# Patient Record
Sex: Female | Born: 1945 | Race: Black or African American | Hispanic: No | Marital: Single | Smoking: Former smoker
Health system: Southern US, Community
[De-identification: ages and names within clinical notes are randomized; demographics above are authoritative.]

## PROBLEM LIST (undated history)

## (undated) DIAGNOSIS — R351 Nocturia: Secondary | ICD-10-CM

## (undated) DIAGNOSIS — M069 Rheumatoid arthritis, unspecified: Secondary | ICD-10-CM

## (undated) DIAGNOSIS — R609 Edema, unspecified: Secondary | ICD-10-CM

## (undated) DIAGNOSIS — E039 Hypothyroidism, unspecified: Secondary | ICD-10-CM

## (undated) DIAGNOSIS — Z860100 Personal history of colon polyps, unspecified: Secondary | ICD-10-CM

## (undated) DIAGNOSIS — R200 Anesthesia of skin: Secondary | ICD-10-CM

## (undated) DIAGNOSIS — R0602 Shortness of breath: Secondary | ICD-10-CM

## (undated) DIAGNOSIS — M1990 Unspecified osteoarthritis, unspecified site: Secondary | ICD-10-CM

## (undated) DIAGNOSIS — J42 Unspecified chronic bronchitis: Secondary | ICD-10-CM

## (undated) DIAGNOSIS — R42 Dizziness and giddiness: Secondary | ICD-10-CM

## (undated) DIAGNOSIS — H04123 Dry eye syndrome of bilateral lacrimal glands: Secondary | ICD-10-CM

## (undated) DIAGNOSIS — I10 Essential (primary) hypertension: Secondary | ICD-10-CM

## (undated) DIAGNOSIS — E785 Hyperlipidemia, unspecified: Secondary | ICD-10-CM

## (undated) DIAGNOSIS — Z8719 Personal history of other diseases of the digestive system: Secondary | ICD-10-CM

## (undated) DIAGNOSIS — R600 Localized edema: Secondary | ICD-10-CM

## (undated) DIAGNOSIS — D649 Anemia, unspecified: Secondary | ICD-10-CM

## (undated) DIAGNOSIS — Z433 Encounter for attention to colostomy: Secondary | ICD-10-CM

## (undated) DIAGNOSIS — Z9289 Personal history of other medical treatment: Secondary | ICD-10-CM

## (undated) DIAGNOSIS — I2510 Atherosclerotic heart disease of native coronary artery without angina pectoris: Secondary | ICD-10-CM

## (undated) DIAGNOSIS — R202 Paresthesia of skin: Secondary | ICD-10-CM

## (undated) DIAGNOSIS — K5790 Diverticulosis of intestine, part unspecified, without perforation or abscess without bleeding: Secondary | ICD-10-CM

## (undated) HISTORY — PX: COLONOSCOPY: SHX174

## (undated) HISTORY — PX: TONSILLECTOMY: SUR1361

## (undated) HISTORY — PX: COLOSTOMY: SHX63

## (undated) HISTORY — PX: ESOPHAGOGASTRODUODENOSCOPY: SHX1529

## (undated) HISTORY — PX: EYE SURGERY: SHX253

## (undated) HISTORY — PX: KNEE ARTHROSCOPY: SUR90

## (undated) HISTORY — PX: TOTAL KNEE ARTHROPLASTY: SHX125

---
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
## 1997-05-08 ENCOUNTER — Ambulatory Visit (HOSPITAL_COMMUNITY): Admission: RE | Admit: 1997-05-08 | Discharge: 1997-05-08 | Payer: Self-pay | Admitting: Orthopedic Surgery

## 1997-07-11 ENCOUNTER — Ambulatory Visit (HOSPITAL_COMMUNITY): Admission: RE | Admit: 1997-07-11 | Discharge: 1997-07-11 | Payer: Self-pay | Admitting: Orthopedic Surgery

## 1997-08-02 ENCOUNTER — Encounter: Admission: RE | Admit: 1997-08-02 | Discharge: 1997-10-31 | Payer: Self-pay | Admitting: Orthopedic Surgery

## 1997-12-08 ENCOUNTER — Ambulatory Visit (HOSPITAL_COMMUNITY): Admission: RE | Admit: 1997-12-08 | Discharge: 1997-12-08 | Payer: Self-pay | Admitting: *Deleted

## 1998-04-12 ENCOUNTER — Ambulatory Visit (HOSPITAL_COMMUNITY): Admission: RE | Admit: 1998-04-12 | Discharge: 1998-04-12 | Payer: Self-pay | Admitting: Orthopedic Surgery

## 1998-09-07 ENCOUNTER — Ambulatory Visit (HOSPITAL_COMMUNITY): Admission: RE | Admit: 1998-09-07 | Discharge: 1998-09-07 | Payer: Self-pay | Admitting: Orthopedic Surgery

## 1998-12-01 ENCOUNTER — Emergency Department (HOSPITAL_COMMUNITY): Admission: EM | Admit: 1998-12-01 | Discharge: 1998-12-01 | Payer: Self-pay | Admitting: Emergency Medicine

## 2000-01-28 HISTORY — PX: COLON SURGERY: SHX602

## 2000-05-07 ENCOUNTER — Ambulatory Visit (HOSPITAL_COMMUNITY): Admission: RE | Admit: 2000-05-07 | Discharge: 2000-05-07 | Payer: Self-pay | Admitting: Cardiology

## 2000-05-08 ENCOUNTER — Encounter: Payer: Self-pay | Admitting: Cardiology

## 2000-08-27 ENCOUNTER — Ambulatory Visit (HOSPITAL_COMMUNITY): Admission: RE | Admit: 2000-08-27 | Discharge: 2000-08-27 | Payer: Self-pay | Admitting: Orthopedic Surgery

## 2000-09-29 ENCOUNTER — Encounter: Payer: Self-pay | Admitting: Gastroenterology

## 2000-09-29 ENCOUNTER — Encounter: Admission: RE | Admit: 2000-09-29 | Discharge: 2000-09-29 | Payer: Self-pay | Admitting: Gastroenterology

## 2000-10-02 ENCOUNTER — Encounter: Payer: Self-pay | Admitting: Orthopedic Surgery

## 2000-10-06 ENCOUNTER — Ambulatory Visit (HOSPITAL_COMMUNITY): Admission: RE | Admit: 2000-10-06 | Discharge: 2000-10-06 | Payer: Self-pay | Admitting: Orthopedic Surgery

## 2000-10-23 ENCOUNTER — Ambulatory Visit (HOSPITAL_COMMUNITY): Admission: RE | Admit: 2000-10-23 | Discharge: 2000-10-23 | Payer: Self-pay | Admitting: Gastroenterology

## 2000-11-11 ENCOUNTER — Encounter: Admission: RE | Admit: 2000-11-11 | Discharge: 2000-11-26 | Payer: Self-pay | Admitting: Orthopedic Surgery

## 2000-11-13 ENCOUNTER — Ambulatory Visit (HOSPITAL_COMMUNITY): Admission: RE | Admit: 2000-11-13 | Discharge: 2000-11-13 | Payer: Self-pay | Admitting: Gastroenterology

## 2000-11-13 ENCOUNTER — Encounter: Payer: Self-pay | Admitting: Gastroenterology

## 2001-01-18 ENCOUNTER — Encounter: Payer: Self-pay | Admitting: Rheumatology

## 2001-01-18 ENCOUNTER — Encounter: Admission: RE | Admit: 2001-01-18 | Discharge: 2001-01-18 | Payer: Self-pay | Admitting: Rheumatology

## 2001-05-19 ENCOUNTER — Encounter: Admission: RE | Admit: 2001-05-19 | Discharge: 2001-05-19 | Payer: Self-pay | Admitting: Gastroenterology

## 2001-05-19 ENCOUNTER — Encounter: Payer: Self-pay | Admitting: Gastroenterology

## 2001-05-19 IMAGING — CT CT PELVIS W/ CM
1 series · 15 of 32 positions shown, 19 images · IV contrast (GASTROGRAFIN & OMNIPAQUE [ID])
Comparison: none

FINDINGS
CLINICAL DATA: LOWER ABDOMINAL AND PELVIC PAIN AND TENDERNESS.  PREVIOUS HX OF HYSTERECTOMY AS
WELL AS RECTAL TEAR DURING VAGINAL DELIVERY.  CON NONE.
TECHNIQUE
SPIRAL CT OF THE ABDOMEN AND PELVIS WAS PERFORMED DURING ADMINISTRATION OF [Q6] OF OMNIPAQUE 300
IV CONTRAST.  ORAL CONTRAST WAS ALSO ADMINISTERED.  DELAYED IMAGES WERE ALSO OBTAINED THROUGH THE
PELVIS AFTER ADMINISTRATION OF RECTAL CONTRAST MATERIAL.  THIS WAS TECHNICALLY DIFFICULT DUE TO THE
CHRONIC RECTAL WALL TEAR AND COMMUNICATION WITH THE VAGINA WHICH WAS EVIDENT DURING PLACEMENT OF
THE RECTAL CATHETER.  COMPARISON IS MADE TO PRIOR CT OF [DATE].
CT ABDOMEN W/CONTRAST
LAYERING SLUDGE IS AGAIN SEEN WITHIN THE GALLBLADDER BUT THERE IS NO EVIDENCE OF DEFINITE
GALLSTONES OR GALLBLADDER WALL THICKENING.  THE LIVER, SPLEEN, PANCREAS, ADRENAL GLANDS, AND
KIDNEYS ARE NORMAL IN APPEARANCE.
THERE IS NO EVIDENCE OF ABDOMINAL MASSES OR INFLAMMATORY PROCESS.  THERE IS NO EVIDENCE OF ABNORMAL
FLUID COLLECTIONS.
IMPRESSION
1.  LAYERING SLUDGE WITHIN THE GALLBLADDER.  NO EVIDENCE OF CHOLECYSTITIS OR OTHER ACUTE PROCESS.
CT PELVIS W/CONTRAST
TODAY'S STUDY DEMONSTRATES A RELATIVELY LONG SEGMENT OF SIGMOID COLONIC WALL THICKENING AS WELL AS
SIGMOID DIVERTICULA IN THIS REGION.  THIS IS MOST LIKELY DUE TO SIGMOID DIVERTICULITIS.  IN
ADDITION, THERE IS A POSSIBLE EXTRALUMINAL COLLECTION CONTAINING AIR AND A SMALL RADIODENSITY WHICH
WAS NOT SEEN ON THE PREVIOUS STUDY WHICH MAY REPRESENT AN INGESTED PILL OR OTHER RADIOPAQUE
SUBSTANCE.  THERE IS ADJACENT THICKENING OF THE DOME OF THE URINARY BLADDER AND A SMALL AMOUNT OF
GAS IS SEEN WITHIN THE URINARY BLADDER.  THESE FINDINGS ARE SUSPICIOUS FOR A COLOVESICAL FISTULA.
RECTAL CONTRAST COULD NOT BE SUCCESSFULLY REFLUXED INTO THE SIGMOID COLON FROM THE RECTAL TUBE DUE
TO THE RECTAL WALL TEAR AND COMMUNICATION WITH THE VAGINA RESULTING IN CONTRAST LEAKAGE.
1.  FINDINGS MOST CONSISTENT WITH SIGMOID DIVERTICULITIS.  THERE IS ALSO A SMALL AMOUNT OF AIR AND
WALL THICKENING IN THE ADJACENT URINARY BLADDER, SUSPICIOUS FOR COLOVESICAL FISTULA.

[Series 2: — · axial · 0.82mm/px · z∈[-370,+22]mm · 15 of 137 slices shown, 19 images]
[im 9/137  soft-tissue]
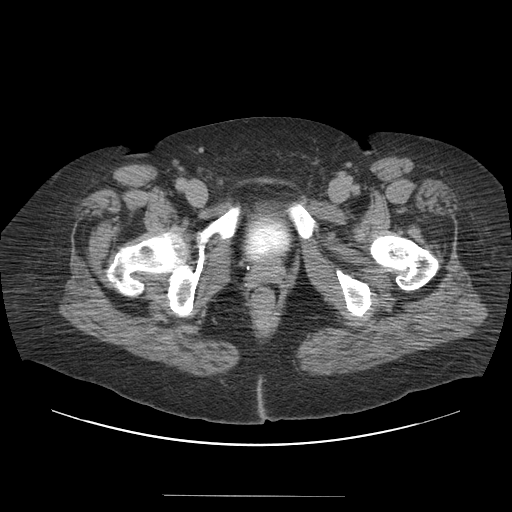
[im 9/137  bone]
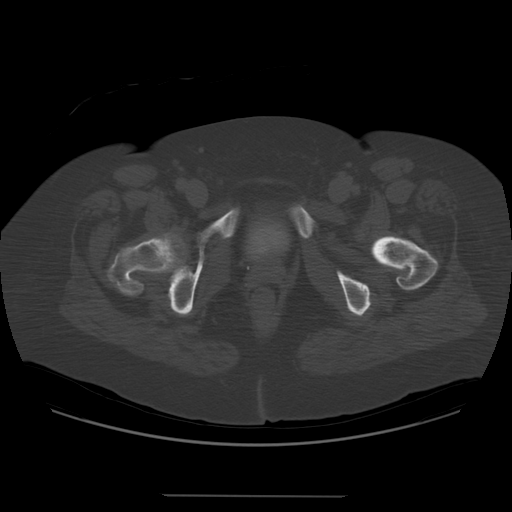
[im 18/137  soft-tissue]
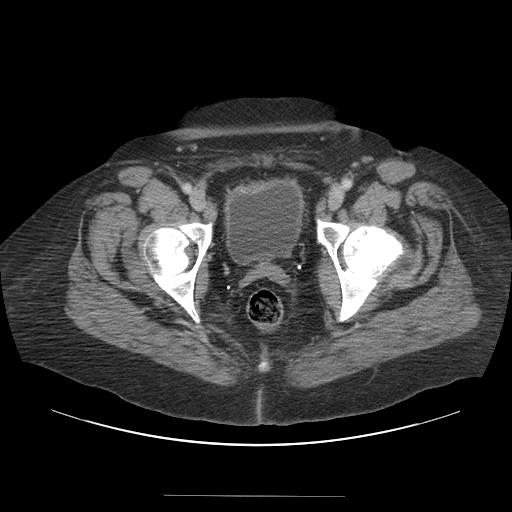
[im 27/137  soft-tissue]
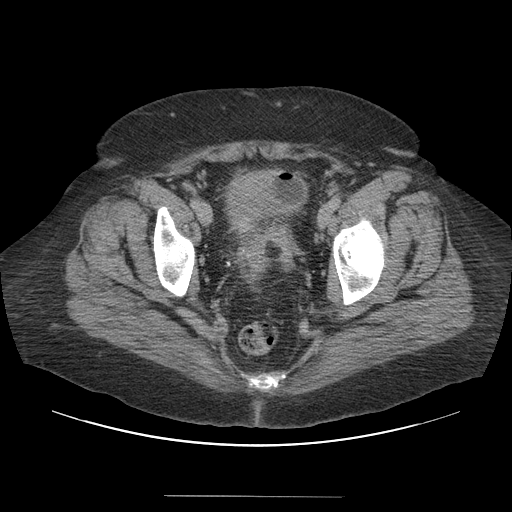
[im 40/137  soft-tissue]
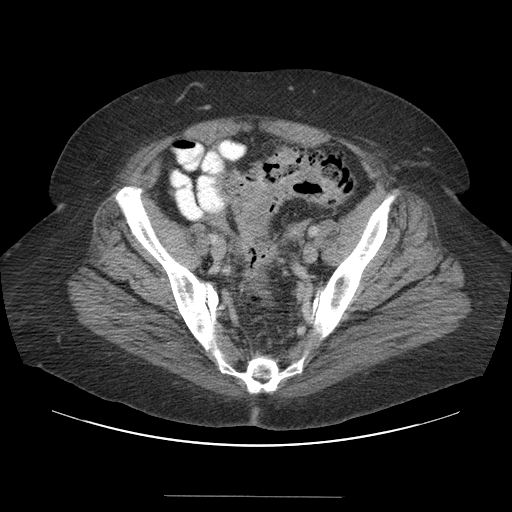
[im 49/137  soft-tissue]
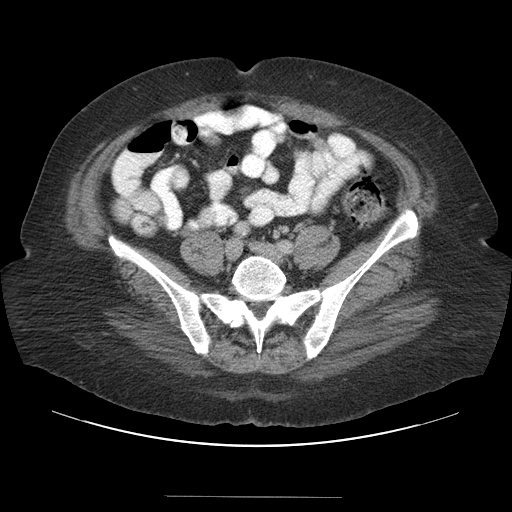
[im 58/137  soft-tissue]
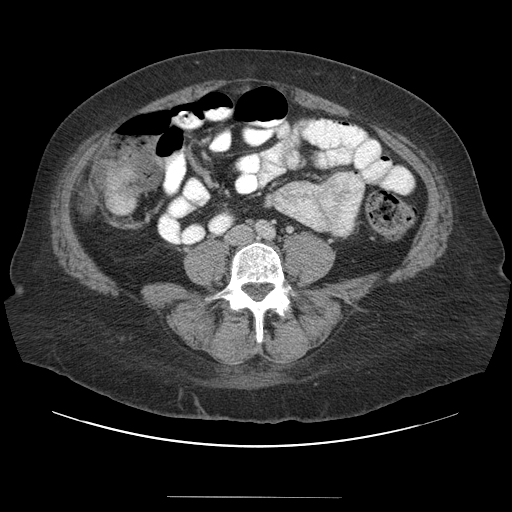
[im 71/137  soft-tissue]
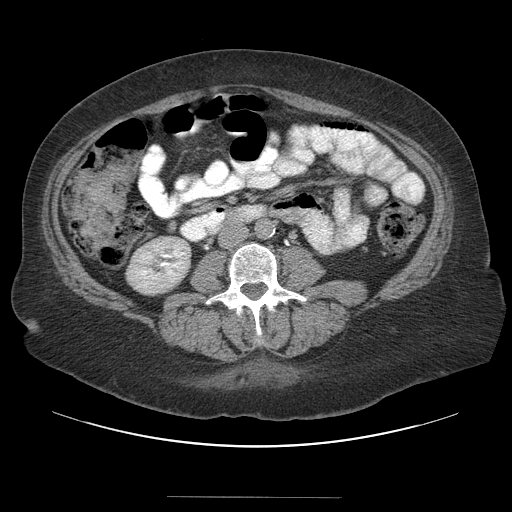
[im 79/137  soft-tissue]
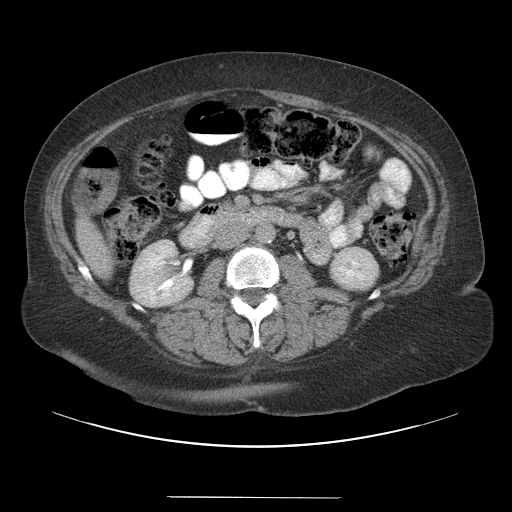
[im 88/137  soft-tissue]
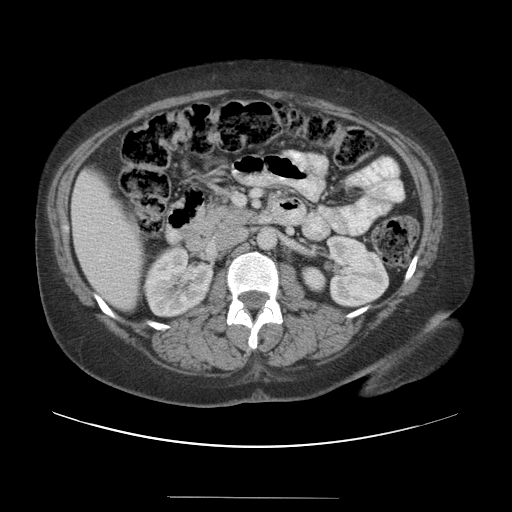
[im 88/137  bone]
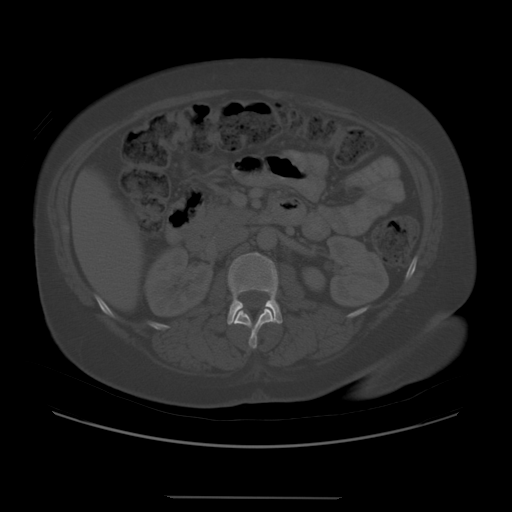
[im 97/137  soft-tissue]
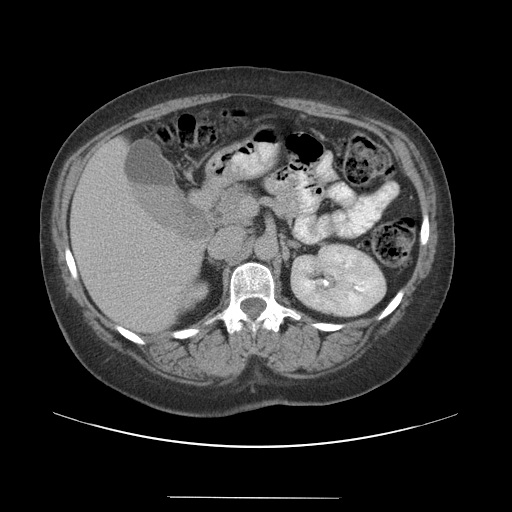
[im 110/137  soft-tissue]
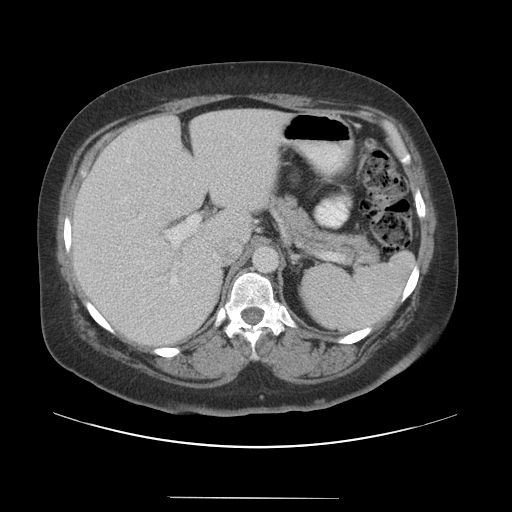
[im 119/137  soft-tissue]
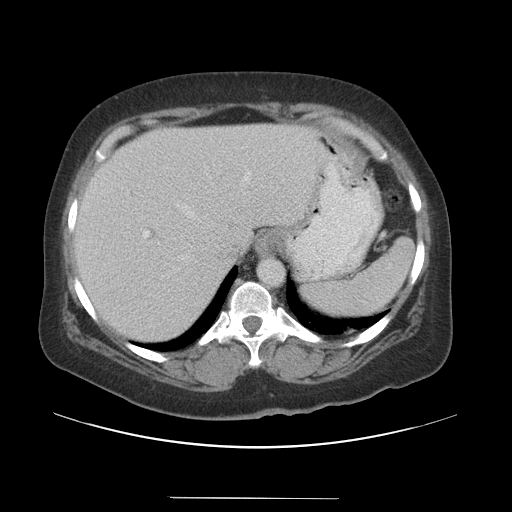
[im 119/137  lung]
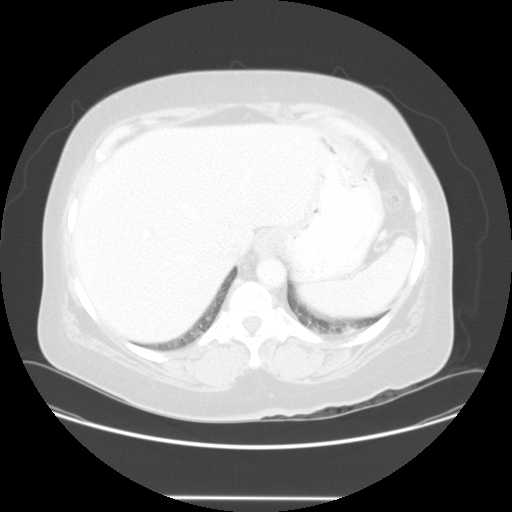
[im 123/137  lung]
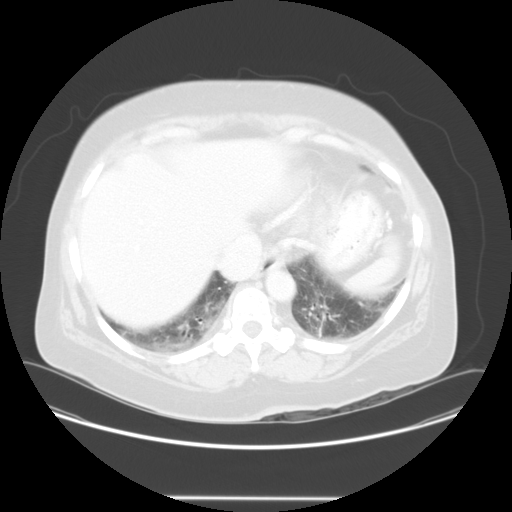
[im 128/137  soft-tissue]
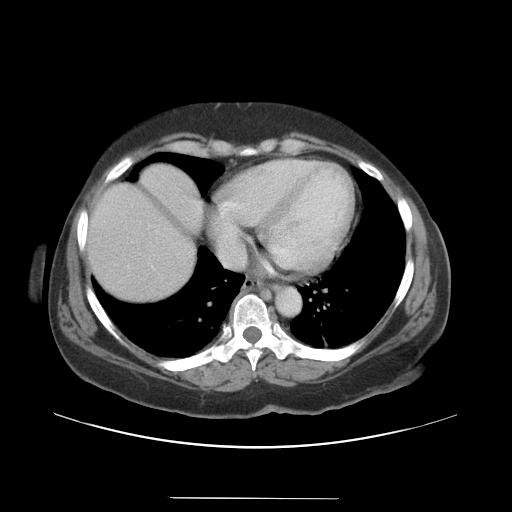
[im 128/137  lung]
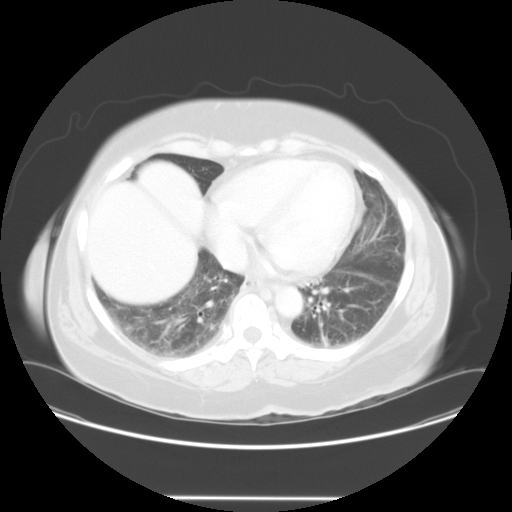
[im 132/137  lung]
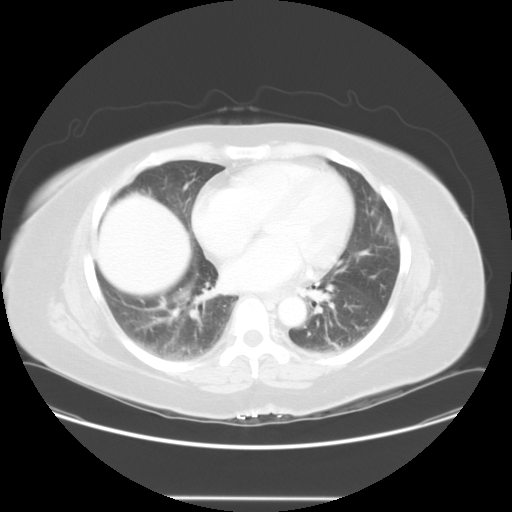

[15 of 32 positions shown; findings below may reference images not displayed]

## 2001-07-14 ENCOUNTER — Ambulatory Visit (HOSPITAL_COMMUNITY): Admission: RE | Admit: 2001-07-14 | Discharge: 2001-07-14 | Payer: Self-pay | Admitting: Cardiology

## 2001-07-14 ENCOUNTER — Encounter: Payer: Self-pay | Admitting: Cardiology

## 2001-08-02 ENCOUNTER — Encounter: Payer: Self-pay | Admitting: General Surgery

## 2001-08-09 ENCOUNTER — Encounter (INDEPENDENT_AMBULATORY_CARE_PROVIDER_SITE_OTHER): Payer: Self-pay | Admitting: Specialist

## 2001-08-09 ENCOUNTER — Inpatient Hospital Stay (HOSPITAL_COMMUNITY): Admission: RE | Admit: 2001-08-09 | Discharge: 2001-08-19 | Payer: Self-pay | Admitting: General Surgery

## 2001-08-16 ENCOUNTER — Encounter: Payer: Self-pay | Admitting: Urology

## 2002-01-27 DIAGNOSIS — Z433 Encounter for attention to colostomy: Secondary | ICD-10-CM

## 2002-01-27 HISTORY — DX: Encounter for attention to colostomy: Z43.3

## 2002-05-02 ENCOUNTER — Encounter: Payer: Self-pay | Admitting: General Surgery

## 2002-05-02 ENCOUNTER — Encounter: Admission: RE | Admit: 2002-05-02 | Discharge: 2002-05-02 | Payer: Self-pay | Admitting: General Surgery

## 2002-06-17 ENCOUNTER — Inpatient Hospital Stay (HOSPITAL_COMMUNITY): Admission: RE | Admit: 2002-06-17 | Discharge: 2002-06-28 | Payer: Self-pay | Admitting: General Surgery

## 2002-06-17 ENCOUNTER — Encounter (INDEPENDENT_AMBULATORY_CARE_PROVIDER_SITE_OTHER): Payer: Self-pay | Admitting: Specialist

## 2002-06-24 ENCOUNTER — Encounter: Payer: Self-pay | Admitting: General Surgery

## 2002-06-26 ENCOUNTER — Encounter: Payer: Self-pay | Admitting: General Surgery

## 2003-06-07 ENCOUNTER — Ambulatory Visit (HOSPITAL_COMMUNITY): Admission: RE | Admit: 2003-06-07 | Discharge: 2003-06-07 | Payer: Self-pay | Admitting: Cardiology

## 2003-06-07 IMAGING — NM NM MYOCAR MULTI W/ SPECT
1 series · 6 of 6 positions shown · non-contrast
Comparison: none

CLINICAL DATA: Chest pain.  Hypercholesterolemia.  Preop.  
 NUCLEAR MEDICINE MYOCARDIAL PERFUSION IMAGING WITH SPECT, [DATE], [II] HOURS

[sc stress gated cardio · 6.85mm/px · 6 of 512 frames shown]
[frame 43/512]
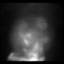
[frame 128/512]
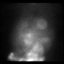
[frame 214/512]
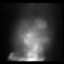
[frame 299/512]
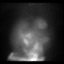
[frame 384/512]
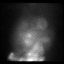
[frame 470/512]
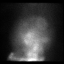

[6 of 6 positions shown; findings below may reference images not displayed]

FINDINGS: After the intravenous injection of 10 and 30 mCi of [II] sestamibi, myocardial perfusion rest and stress imaging was obtained.  Persantine was utilized as the stress agent.
 MYOCARDIAL PERFUSION IMAGING WITH SPECT
 In the anterolateral apex, a perfusion defect is seen on the stress images that reperfuses on the rest images compatible with an area of stress induced ischemia.  This is visualized on all three views. 
 WALL MOTION
 There is normal wall motion and wall thickening.
 EJECTION FRACTION
 Calculated ejection fraction is 67% with an end diastolic volume of 99 ml and an end systolic volume of 32 ml.  
 IMPRESSION
 EF 67%.
 Stress induced ischemia in the anterolateral apex.

## 2003-06-07 IMAGING — NM NM MYOCAR MULTI W/ SPECT
1 series · 6 of 6 positions shown · non-contrast
Comparison: none

CLINICAL DATA: Chest pain.  Hypercholesterolemia.  Preop.  
 NUCLEAR MEDICINE MYOCARDIAL PERFUSION IMAGING WITH SPECT, [DATE], [II] HOURS

[Series 1: rc rest cardiolite · 6.85mm/px · 6 of 64 frames shown]
[frame 6/64]
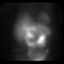
[frame 16/64]
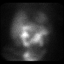
[frame 27/64]
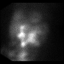
[frame 38/64]
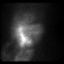
[frame 48/64]
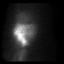
[frame 59/64]
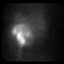

[6 of 6 positions shown; findings below may reference images not displayed]

FINDINGS: After the intravenous injection of 10 and 30 mCi of [II] sestamibi, myocardial perfusion rest and stress imaging was obtained.  Persantine was utilized as the stress agent.
 MYOCARDIAL PERFUSION IMAGING WITH SPECT
 In the anterolateral apex, a perfusion defect is seen on the stress images that reperfuses on the rest images compatible with an area of stress induced ischemia.  This is visualized on all three views. 
 WALL MOTION
 There is normal wall motion and wall thickening.
 EJECTION FRACTION
 Calculated ejection fraction is 67% with an end diastolic volume of 99 ml and an end systolic volume of 32 ml.  
 IMPRESSION
 EF 67%.
 Stress induced ischemia in the anterolateral apex.

## 2003-06-22 ENCOUNTER — Ambulatory Visit (HOSPITAL_COMMUNITY): Admission: RE | Admit: 2003-06-22 | Discharge: 2003-06-22 | Payer: Self-pay | Admitting: Cardiology

## 2003-06-22 IMAGING — CR DG CHEST 2V
2 series · 2 of 2 positions shown · non-contrast
Comparison: [DATE].

CLINICAL DATA: Chest pain. Pre-catheterization. 
 TWO VIEW CHEST

[view not recorded (1 of 2)]
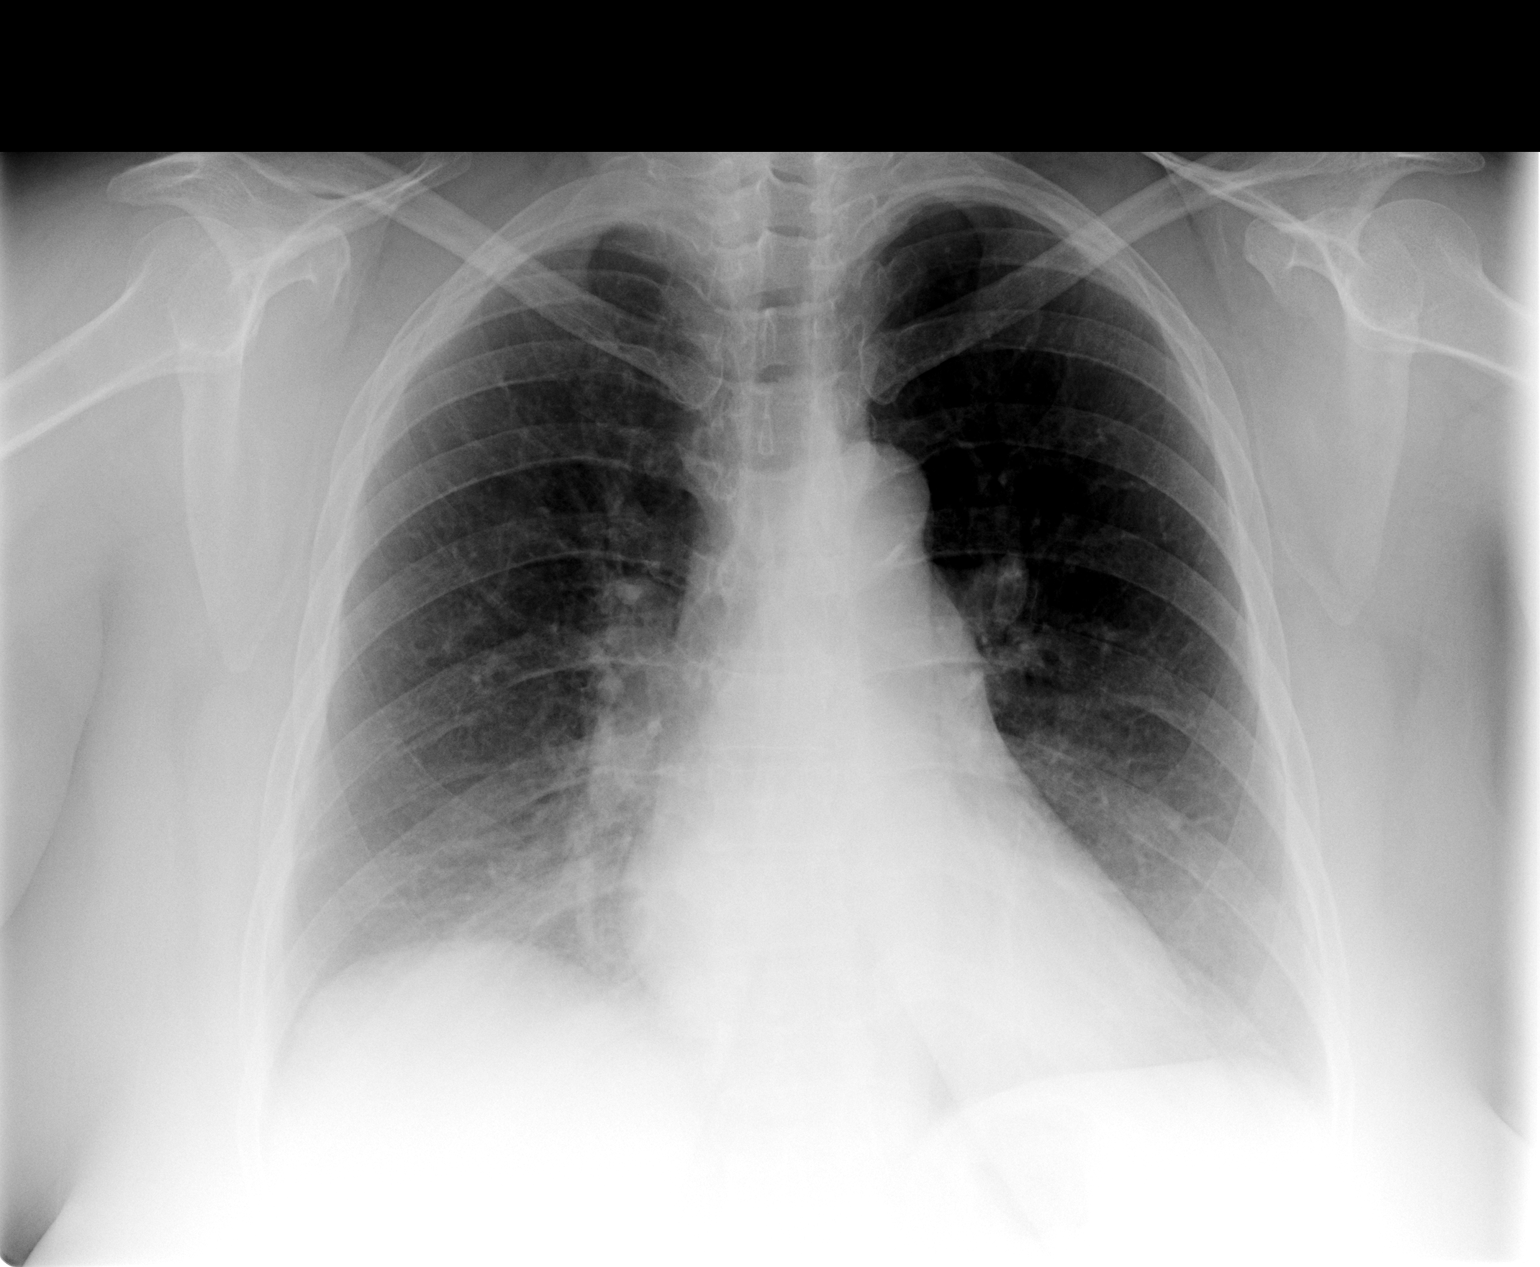

[view not recorded (2 of 2)]
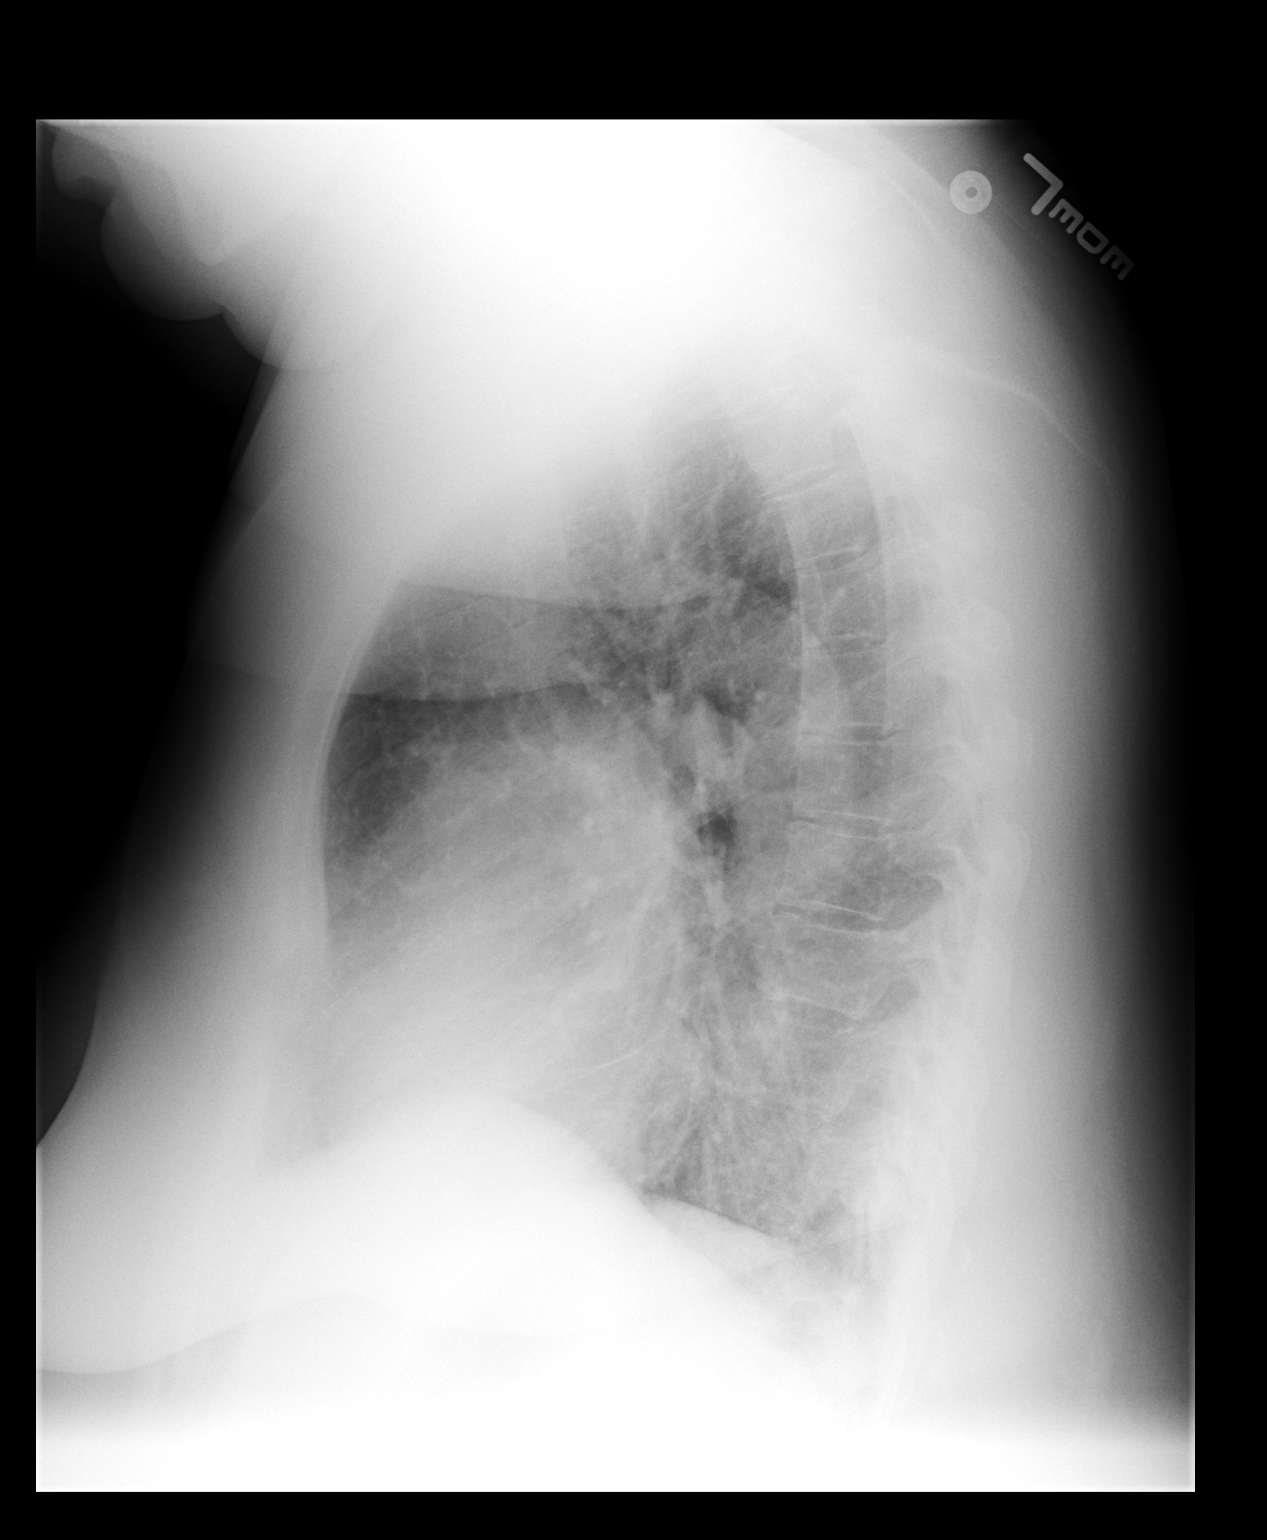

[2 of 2 positions shown; findings below may reference images not displayed]

The heart is borderline in size with a left ventricular configuration.  There are mildly accentuated bronchial markings.  There are no focal infiltrates.  
 IMPRESSION
 Borderline cardiac size with mildly accentuated bronchial markings.  No focal infiltrates.

## 2003-08-03 IMAGING — CR DG CHEST 2V
2 series · 2 of 2 positions shown · non-contrast
Comparison: [DATE].

CLINICAL DATA: 58-year-old female with degenerative arthritis of the left knee.  Preoperative respiratory evaluation for left knee replacement.  History of smoking and shortness of breath with exertion.
 CHEST, TWO VIEWS [DATE]

[view not recorded (1 of 2)]
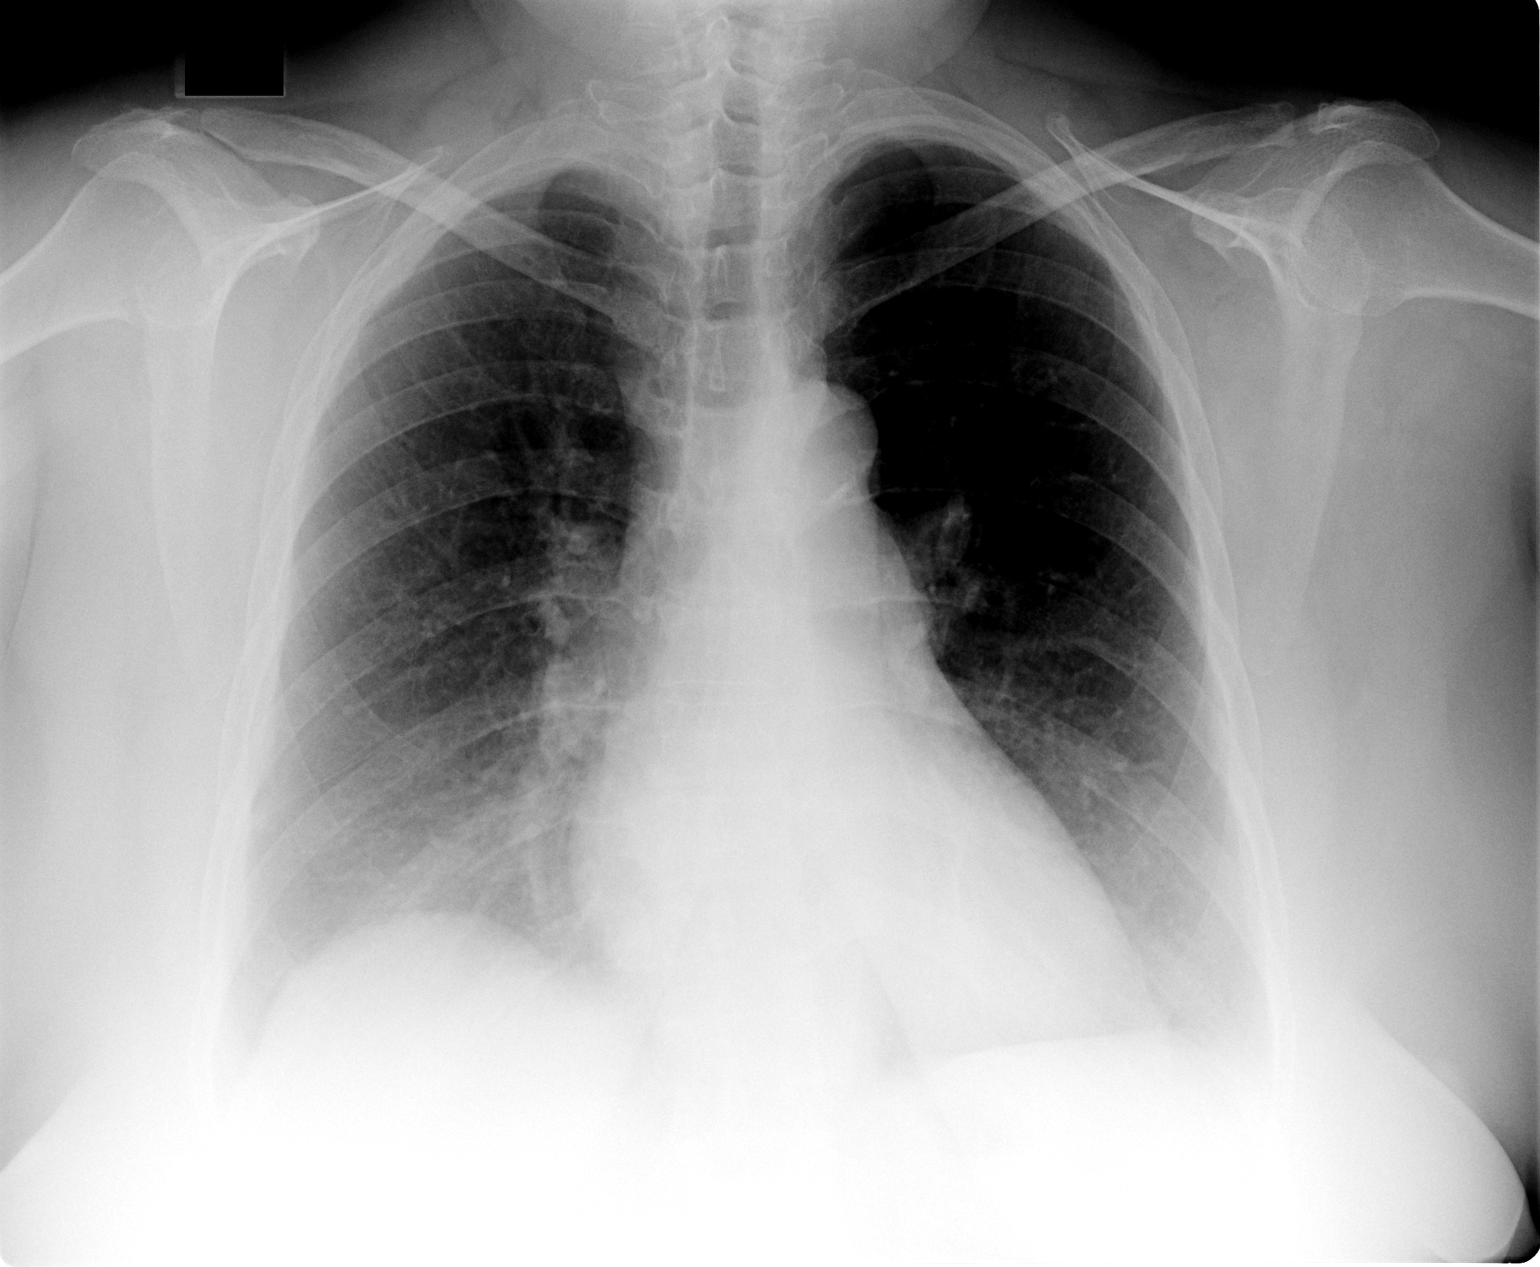

[view not recorded (2 of 2)]
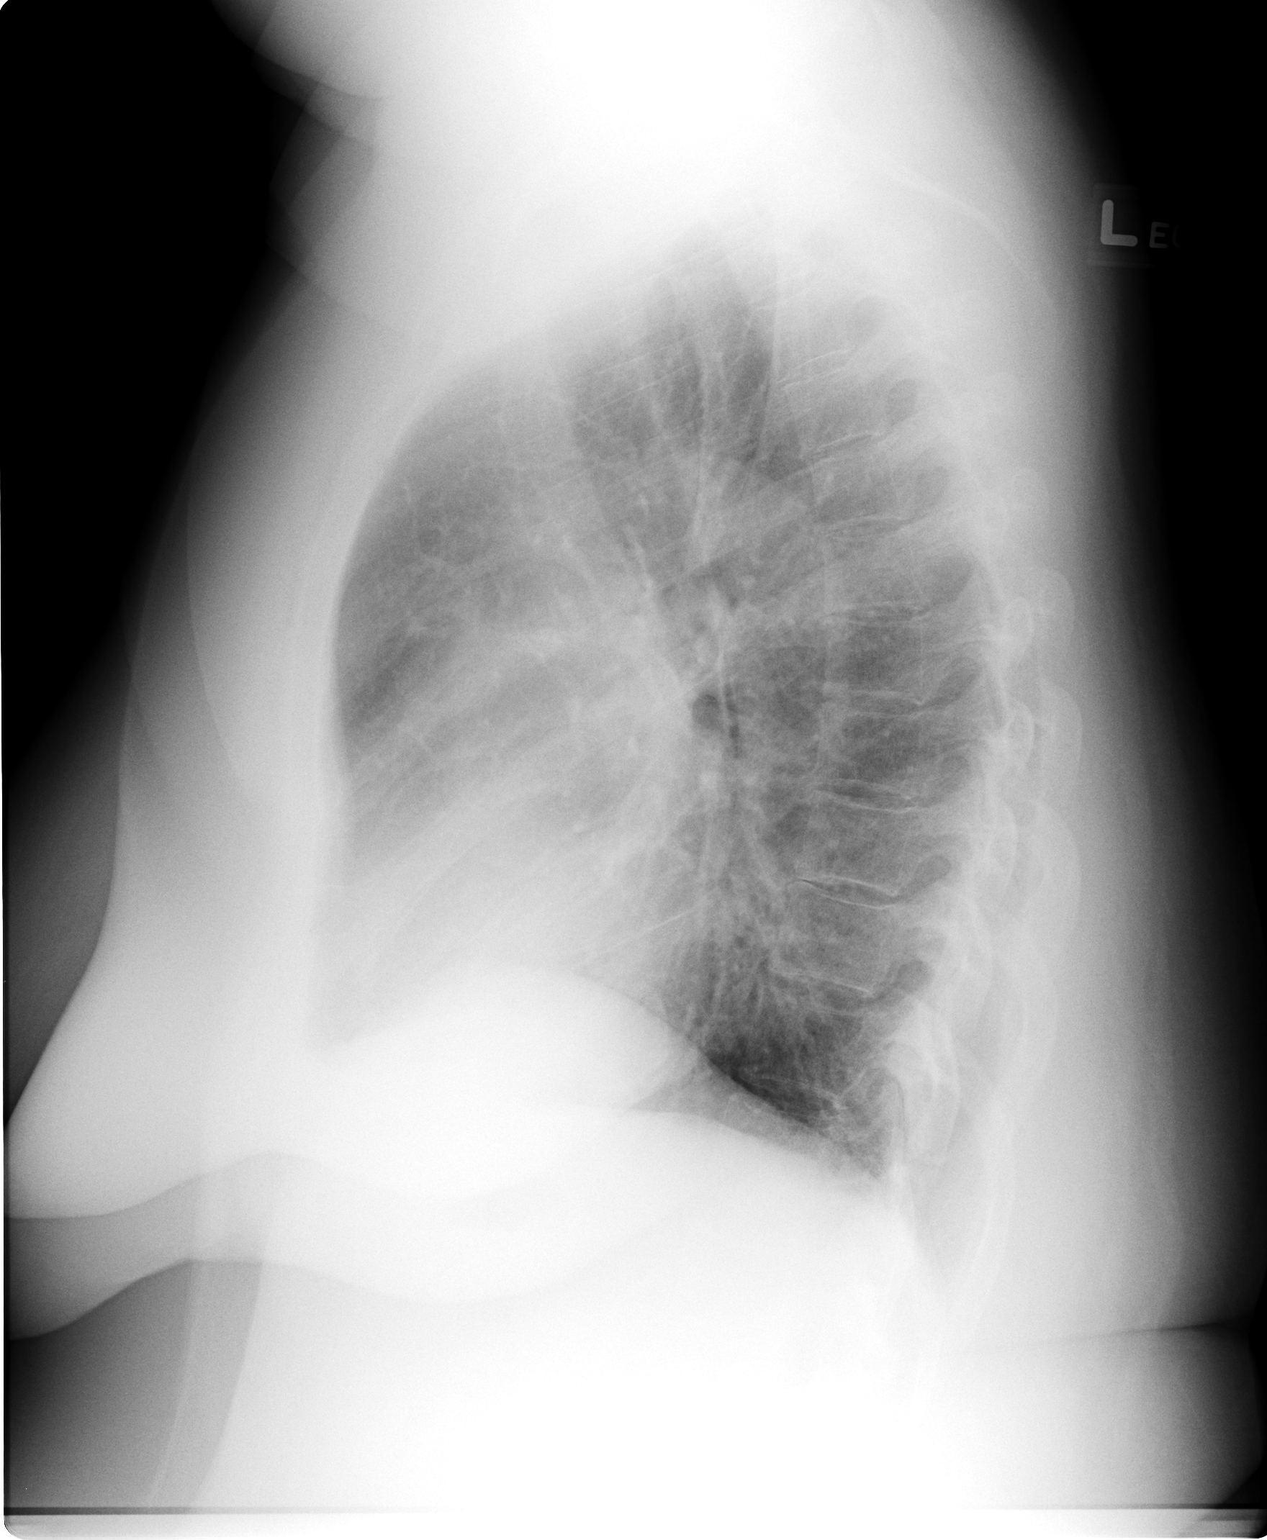

[2 of 2 positions shown; findings below may reference images not displayed]

The heart is mildly prominent with vascular congestion centrally.  No acute airspace disease, edema, effusion, or pneumothorax.  There is a mild pectus deformity on the lateral view.  
 IMPRESSION
 Stable exam without acute finding.

## 2003-08-08 ENCOUNTER — Inpatient Hospital Stay (HOSPITAL_COMMUNITY): Admission: RE | Admit: 2003-08-08 | Discharge: 2003-08-14 | Payer: Self-pay | Admitting: Orthopedic Surgery

## 2003-08-12 IMAGING — CR DG CHEST 2V
2 series · 2 of 2 positions shown · non-contrast
Comparison: [DATE].

CLINICAL DATA: 58-year-old female ? degenerative arthritis status post total knee replacement.  Patient has congestion.  
 CHEST (TWO VIEWS) [DATE]

[view not recorded (1 of 2)]
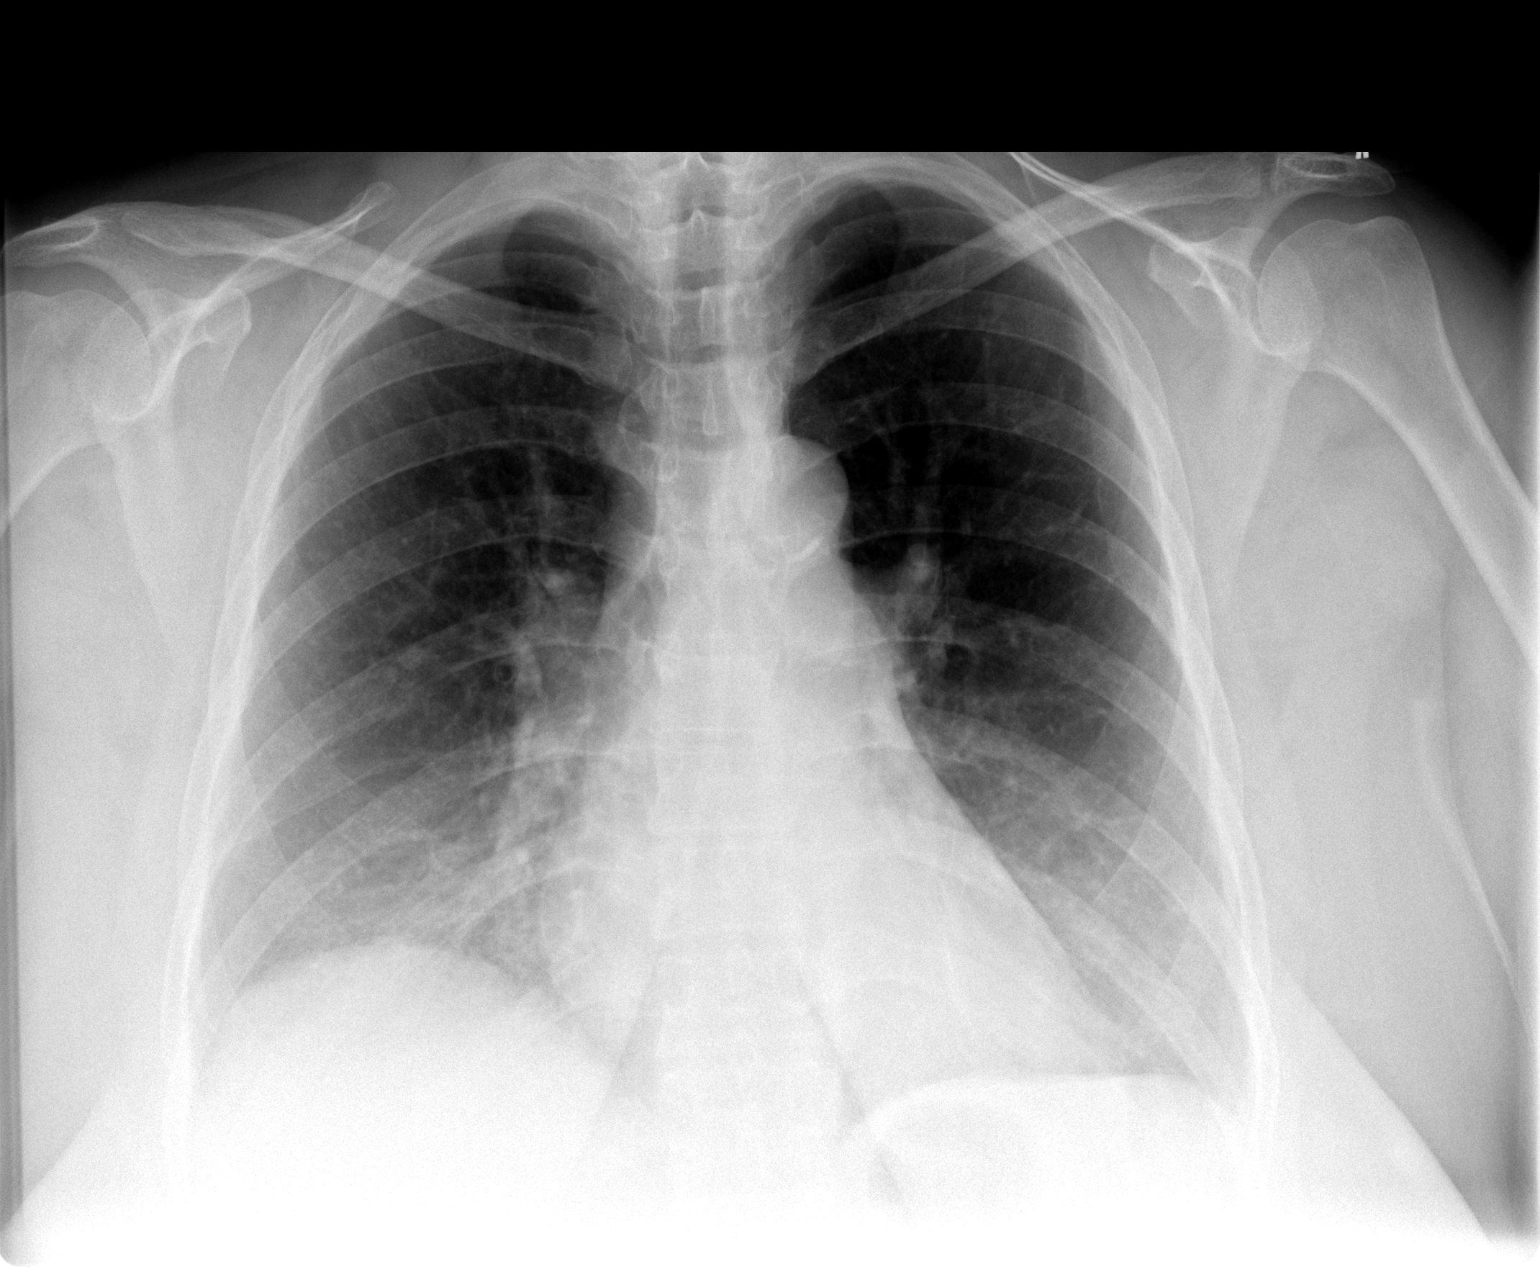

[view not recorded (2 of 2)]
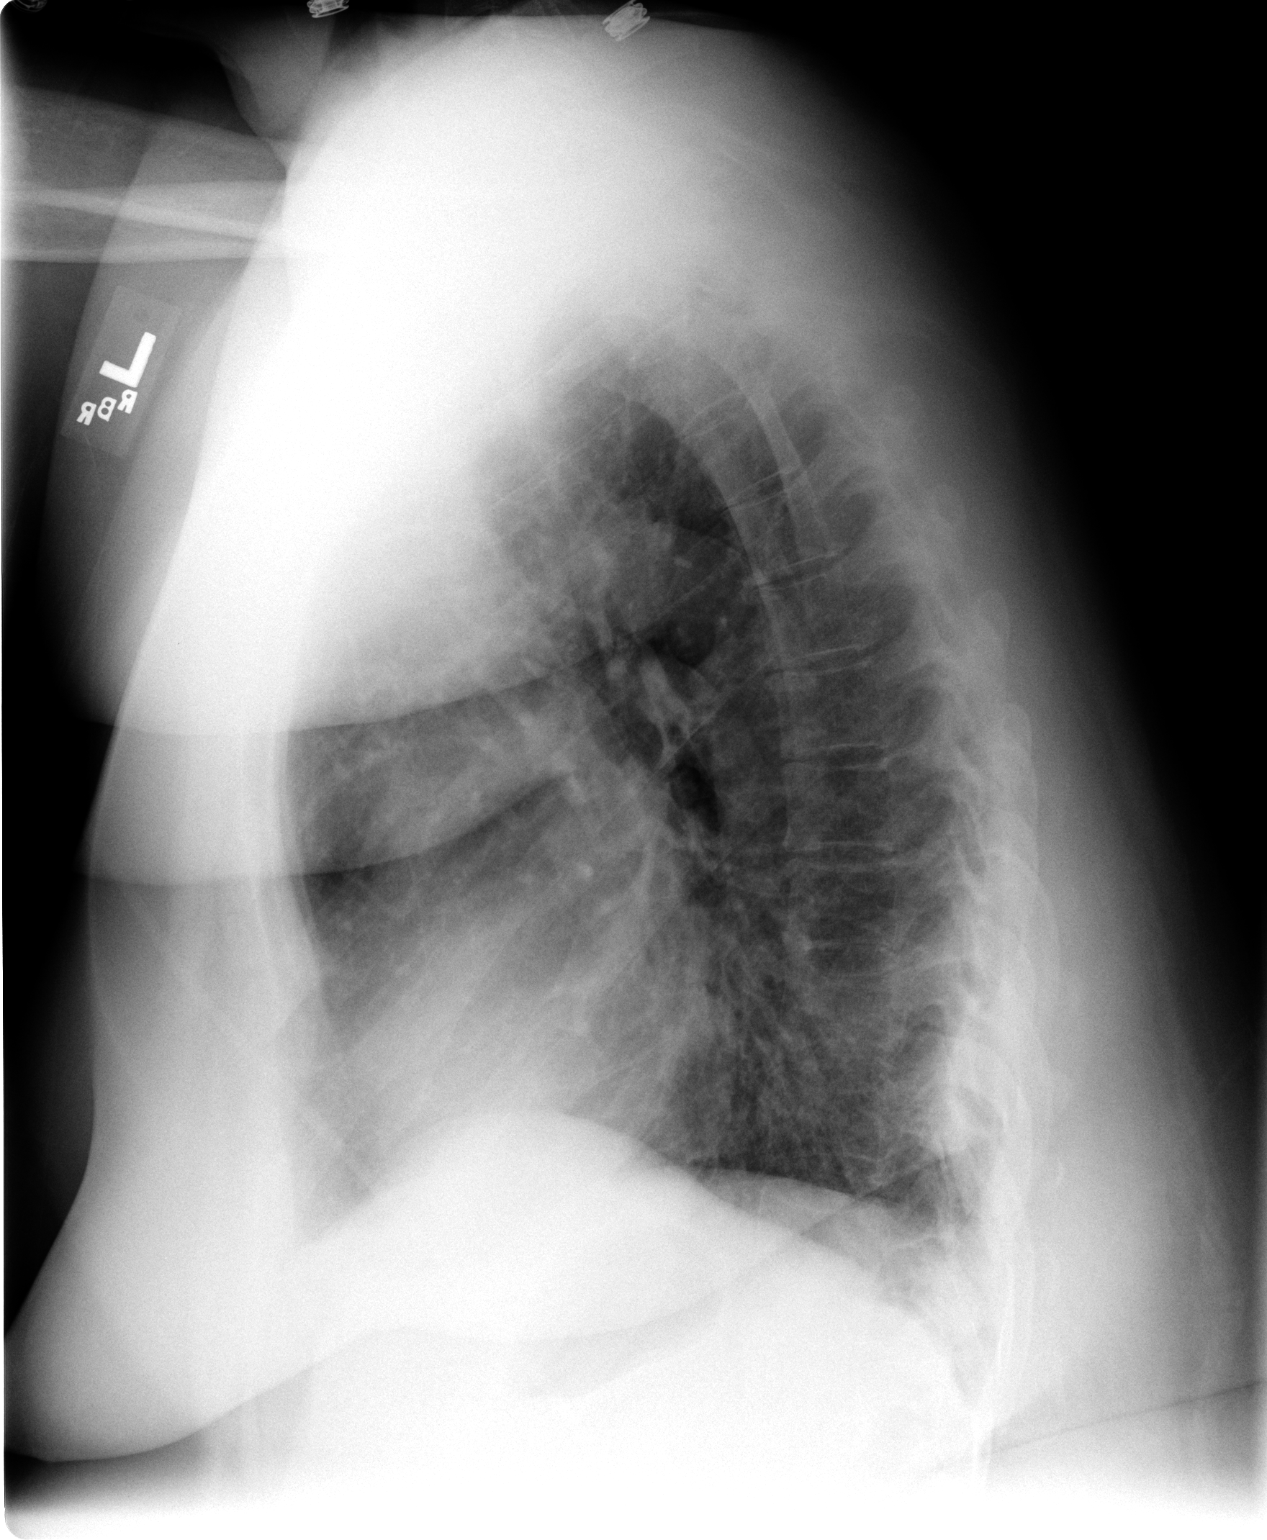

[2 of 2 positions shown; findings below may reference images not displayed]

FINDINGS: There is mild cardiac prominence and central vascular congestion.  Peribronchial thickening is also noted.  No acute pneumonia, effusion, edema, or pneumothorax.  Prominent vascular markings are evident in the right hilum. 
 IMPRESSION
 Stable exam with mild bronchial thickening centrally and borderline heart size.  No acute airspace disease or CHF.

## 2004-12-25 ENCOUNTER — Ambulatory Visit (HOSPITAL_COMMUNITY): Admission: RE | Admit: 2004-12-25 | Discharge: 2004-12-25 | Payer: Self-pay | Admitting: Orthopedic Surgery

## 2005-01-02 ENCOUNTER — Emergency Department (HOSPITAL_COMMUNITY): Admission: EM | Admit: 2005-01-02 | Discharge: 2005-01-02 | Payer: Self-pay | Admitting: Emergency Medicine

## 2005-01-02 IMAGING — CR DG CHEST 1V PORT
1 series · 1 of 1 positions shown · non-contrast
Comparison: [DATE].

CLINICAL DATA: Chest pain. 
PORTABLE CHEST ? [DATE] ([DATE] HOURS):

[view not recorded]
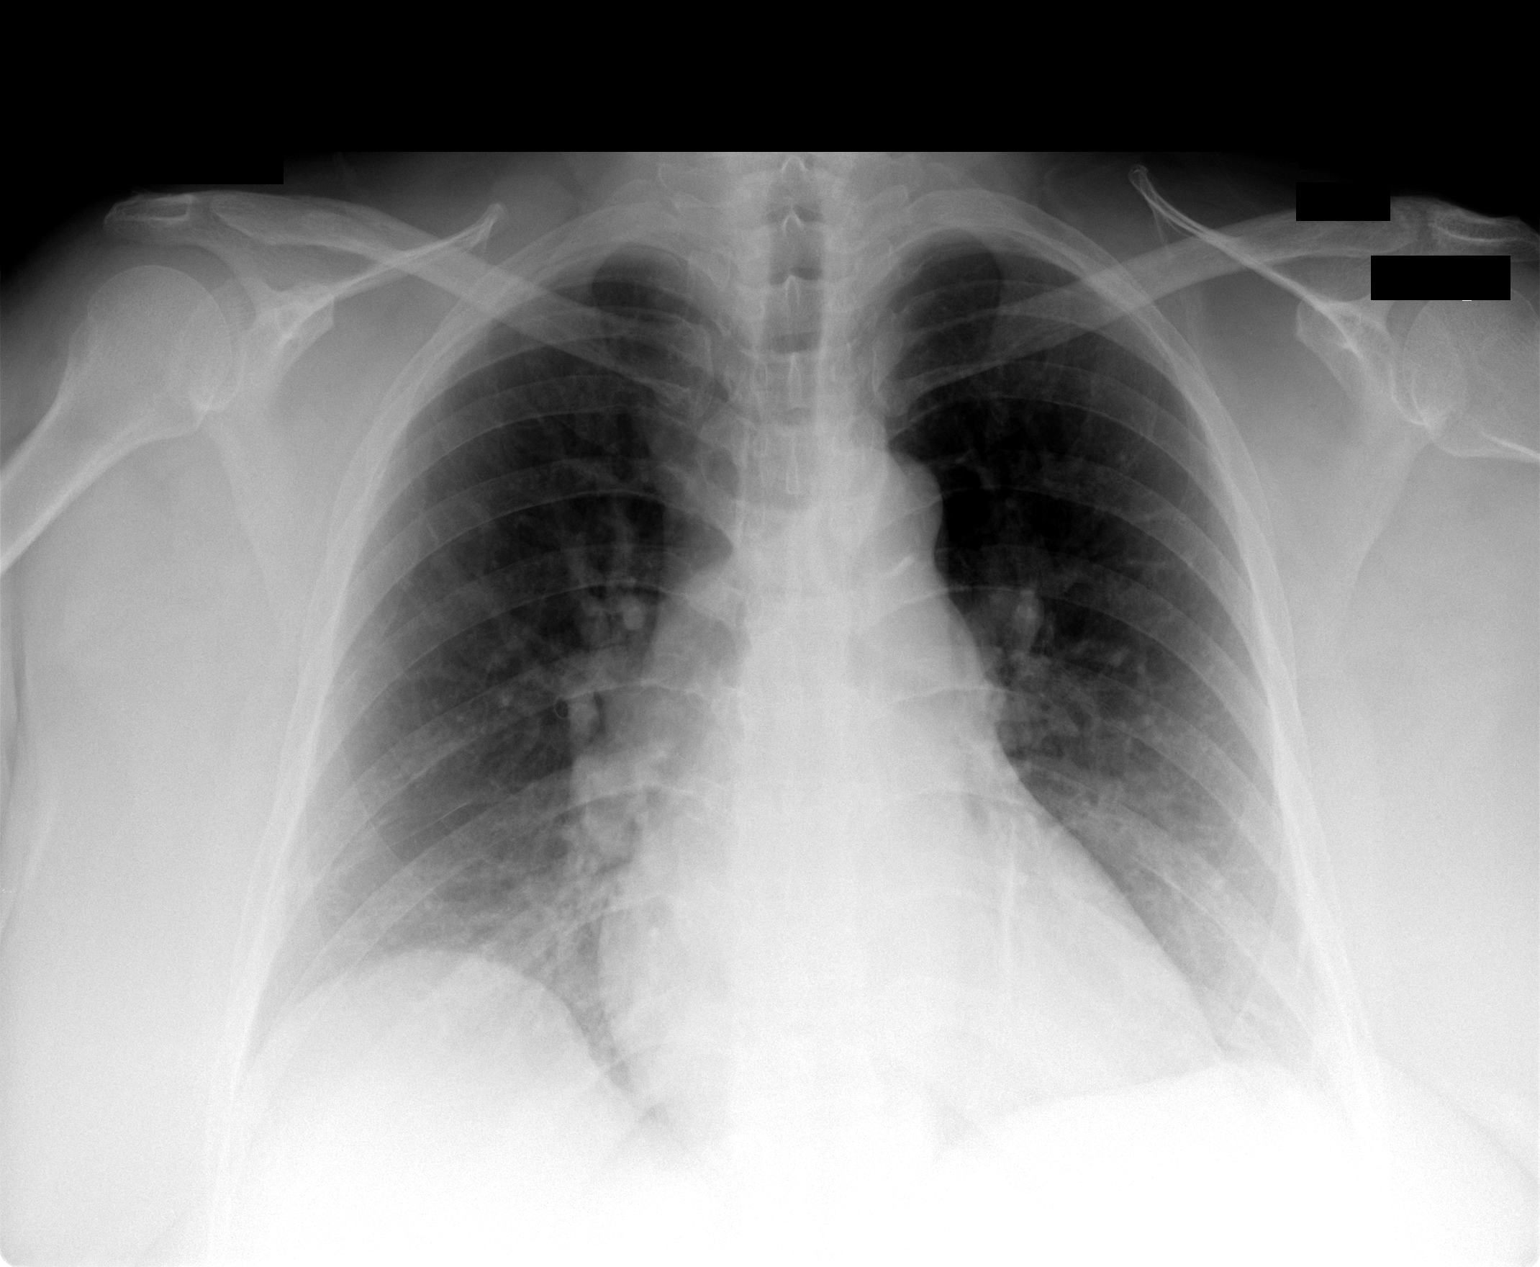

[1 of 1 positions shown; findings below may reference images not displayed]

The heart is upper normal in size. There is mild vascular congestion.  There is no infiltrate or effusion. Mild vascular congestion without edema or effusion.
IMPRESSION: Mild vascular congestion without edema or effusion.

## 2005-01-02 IMAGING — CT CT ANGIO CHEST
3 of 6 series · 19 of 36 positions shown · IV contrast (APPLIED)
Comparison: None available.

CLINICAL DATA: Chest pain.  Evaluate for pulmonary embolus. 
 CT ANGIOGRAPHY OF CHEST:
TECHNIQUE: Multidetector CT imaging of the chest was performed during bolus injection of intravenous contrast.  Multiplanar CT angiographic image reconstructions were generated to evaluate the vascular anatomy.
 Contrast:  125 cc Omnipaque 300

[Series 5: pe 1.0 b20f st · axial · 0.70mm/px · z∈[-223,+12]mm · 15 of 269 slices shown]
[im 17/269  lung]
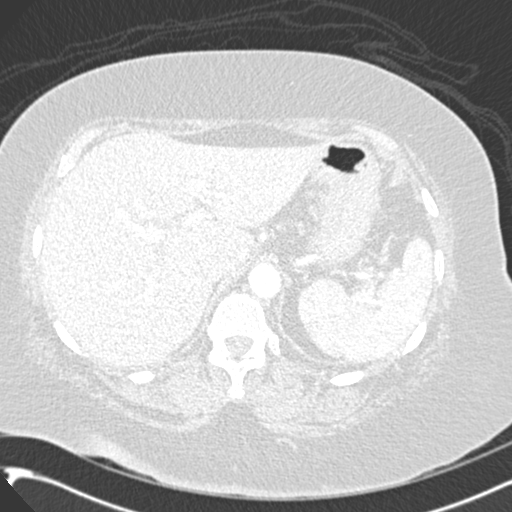
[im 34/269  mediastinal]
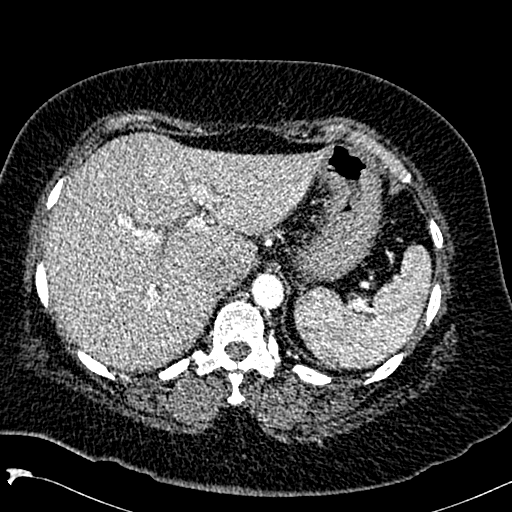
[im 51/269  lung]
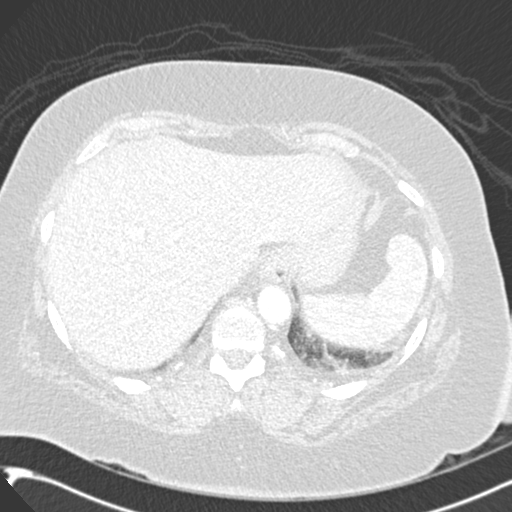
[im 68/269  mediastinal]
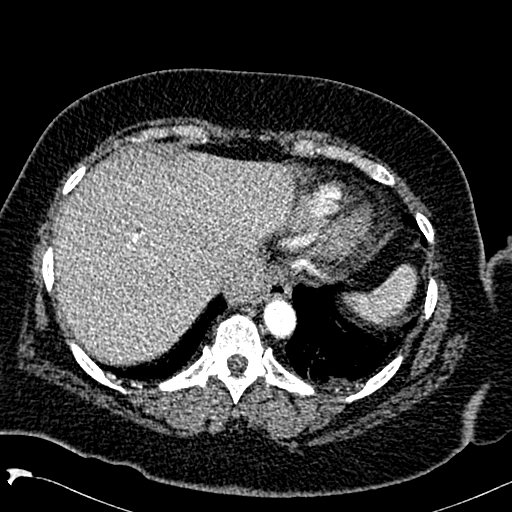
[im 84/269  lung]
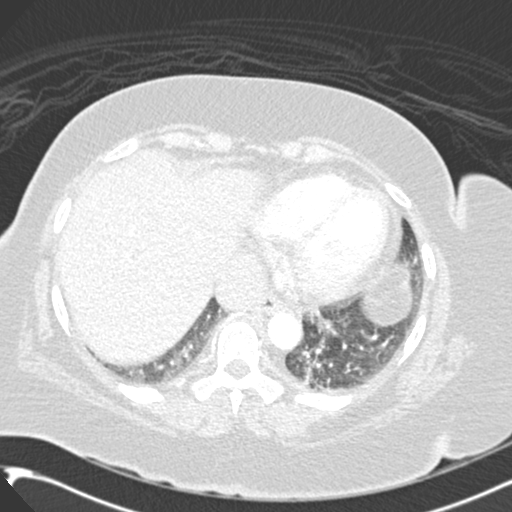
[im 101/269  mediastinal]
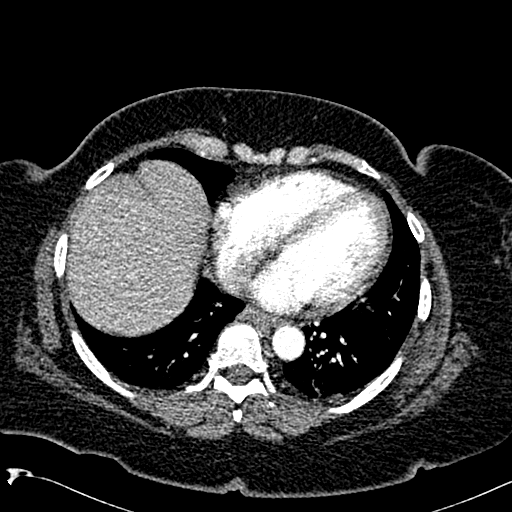
[im 118/269  lung]
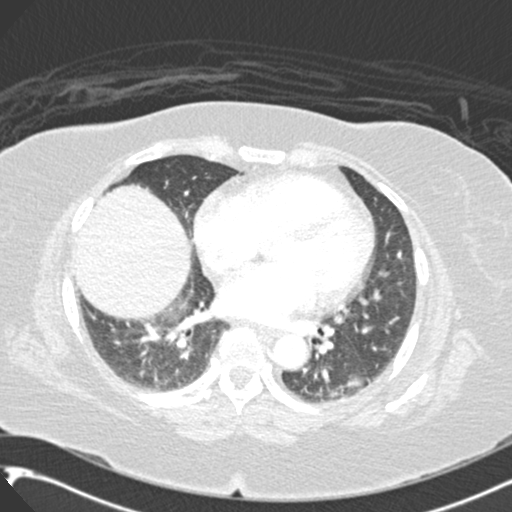
[im 135/269  mediastinal]
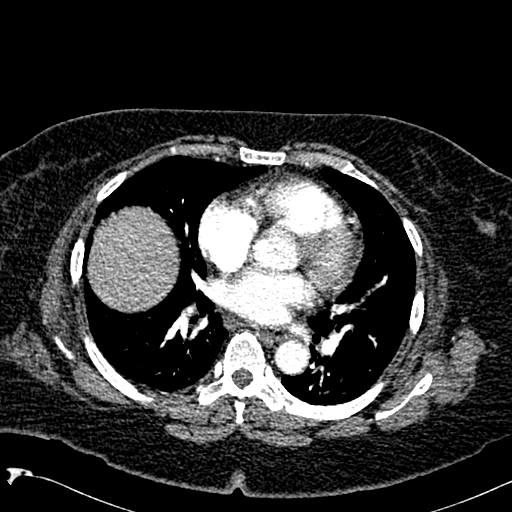
[im 151/269  lung]
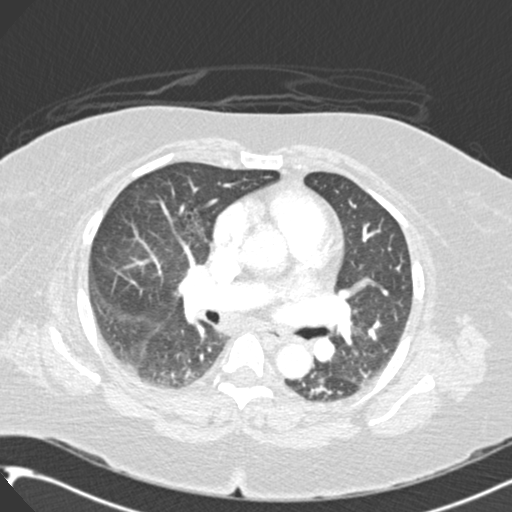
[im 168/269  mediastinal]
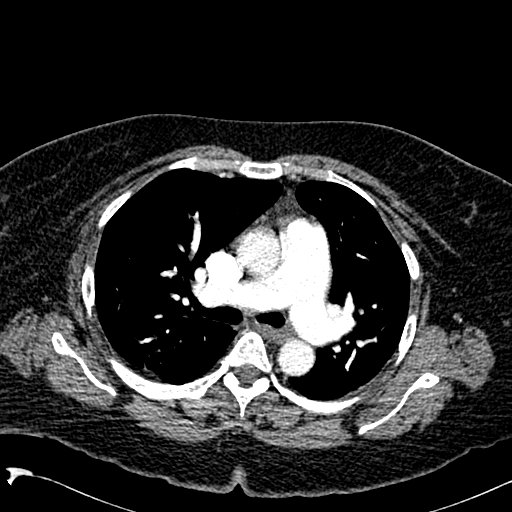
[im 185/269  lung]
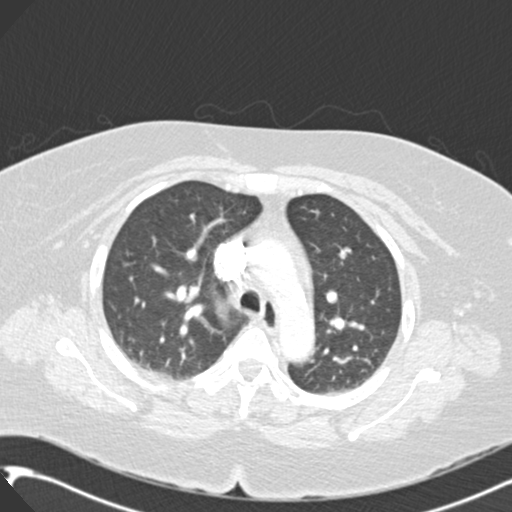
[im 202/269  mediastinal]
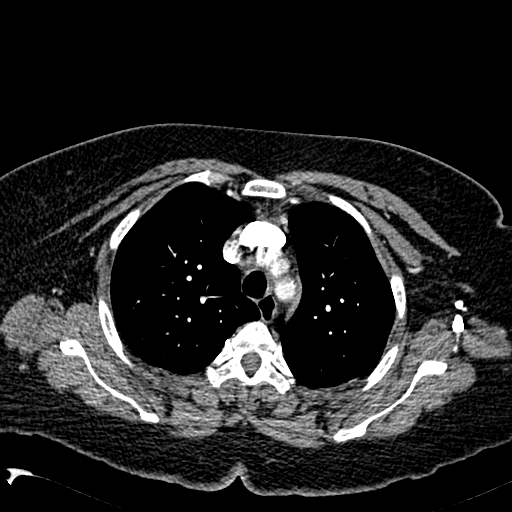
[im 218/269  lung]
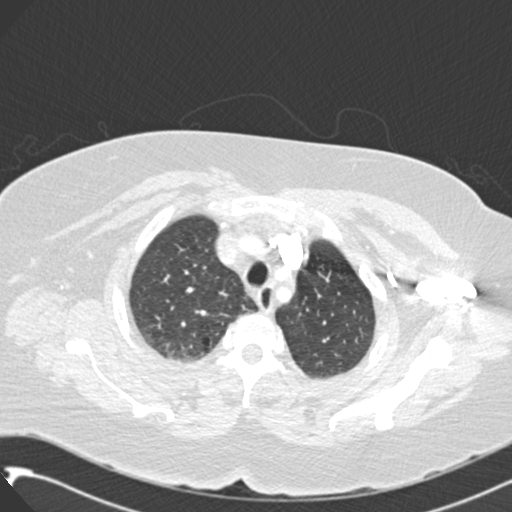
[im 235/269  mediastinal]
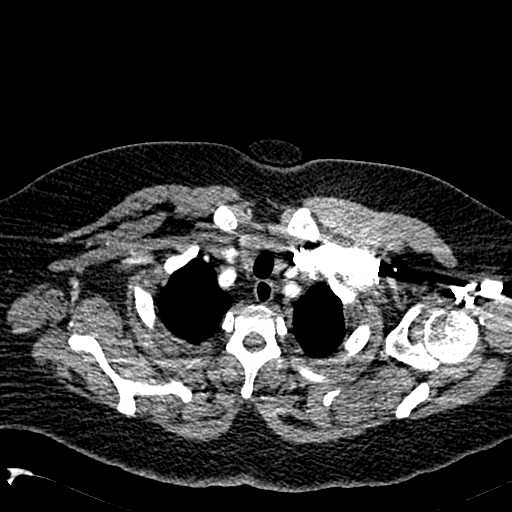
[im 252/269  lung]
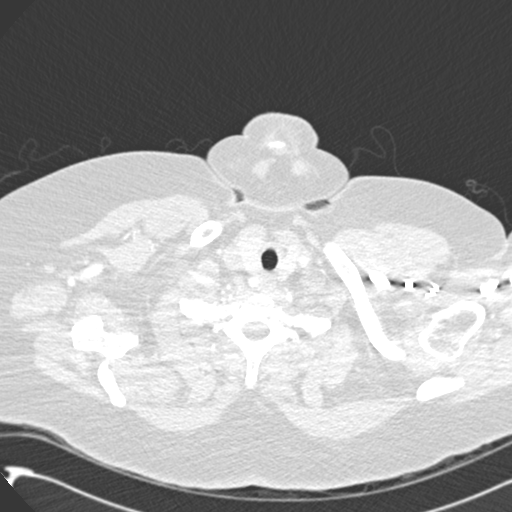

[Series 6: pe 3.0 b70f lung · axial · 0.70mm/px · 1 of 86 slices shown]
[im 22/86  mediastinal]
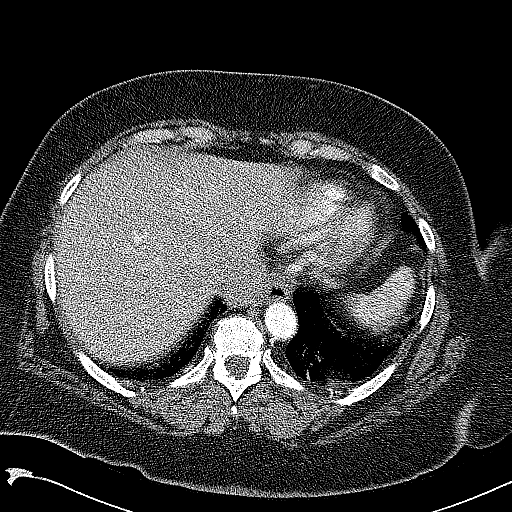

[Series 602: coronal · coronal · 0.70mm/px · 3 of 39 slices shown]
[im 8/39  mediastinal]
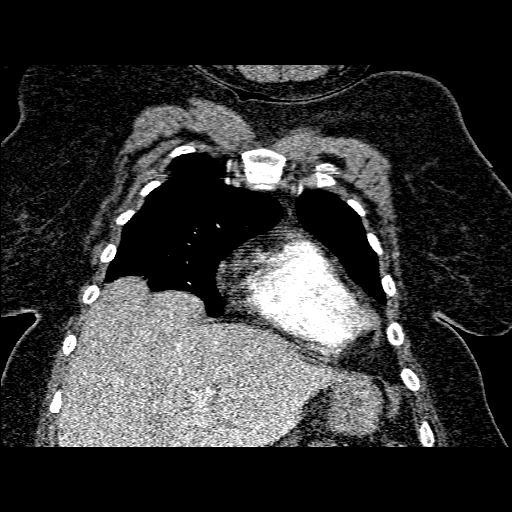
[im 16/39  mediastinal]
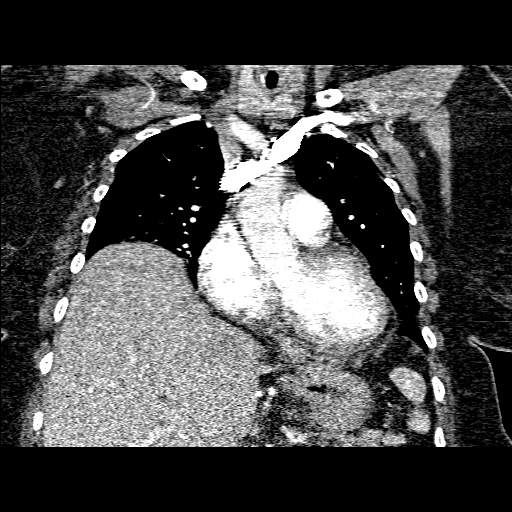
[im 23/39  mediastinal]
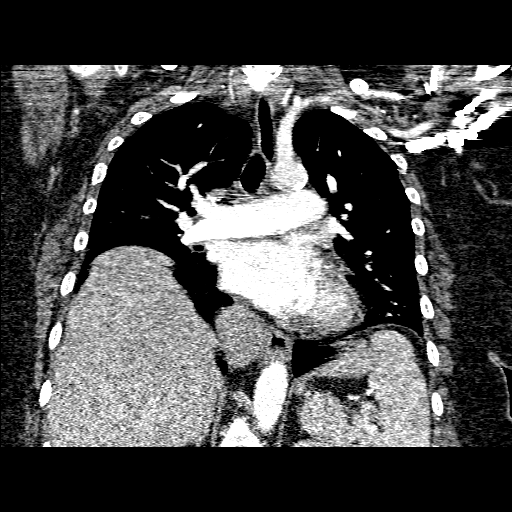

[19 of 36 positions shown; findings below may reference images not displayed]

FINDINGS: Negative for pulmonary emboli.  The study is limited by large patient size and breathing but no large or medium size emboli are identified.  There is mild bibasilar atelectasis.  There is no pleural effusion.  There is no infiltrate, mass or adenopathy.
IMPRESSION: Mild bibasilar atelectasis.  Negative for pulmonary emboli.

## 2005-01-04 ENCOUNTER — Ambulatory Visit (HOSPITAL_COMMUNITY): Admission: RE | Admit: 2005-01-04 | Discharge: 2005-01-04 | Payer: Self-pay | Admitting: Emergency Medicine

## 2005-01-09 ENCOUNTER — Encounter: Admission: RE | Admit: 2005-01-09 | Discharge: 2005-01-09 | Payer: Self-pay | Admitting: Gastroenterology

## 2005-01-09 IMAGING — CT CT ABDOMEN W/ CM
3 of 6 series · 12 of 42 positions shown, 18 images · IV contrast (READY/WATER & [ID] OMNI 300)
Comparison: [REDACTED] abdominal CT [DATE].
COMPARISON: [REDACTED] pelvic CT [DATE].

CLINICAL DATA: Diffuse abdominal pain, diarrhea, hematochezia.  
 ABDOMEN CT WITH CONTRAST:
TECHNIQUE: Multidetector CT imaging of the abdomen was performed following the standard protocol during bolus administration of intravenous contrast.
 Contrast:  125 cc Omnipaque 300
TECHNIQUE: Multidetector CT imaging of the pelvis was performed following the standard protocol during bolus administration of intravenous contrast.

[Series 2: a&p w/ · axial · 0.78mm/px · z∈[-370,-95]mm · 4 of 93 slices shown, 9 images (1 of 2)]
[im 19/93  soft-tissue]
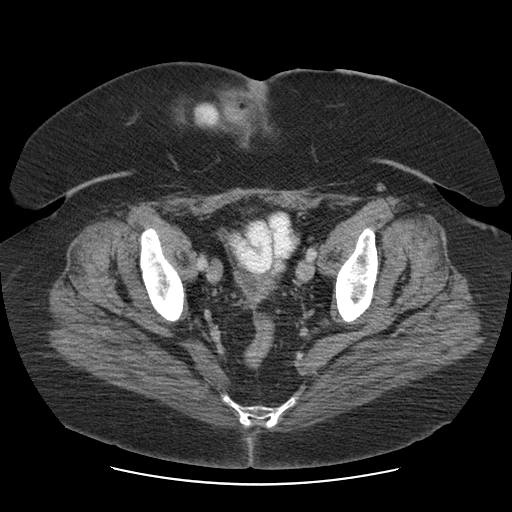
[im 19/93  lung]
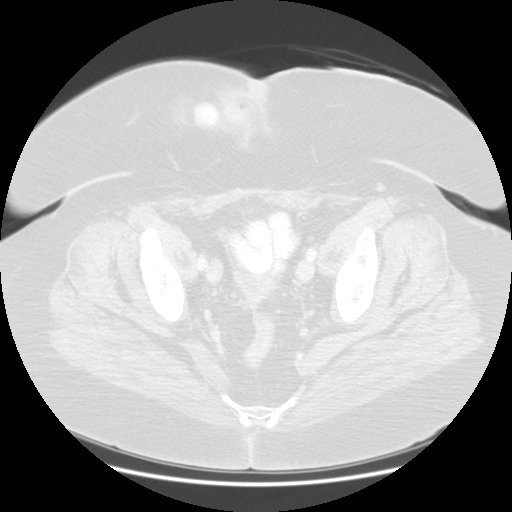
[im 19/93  bone]
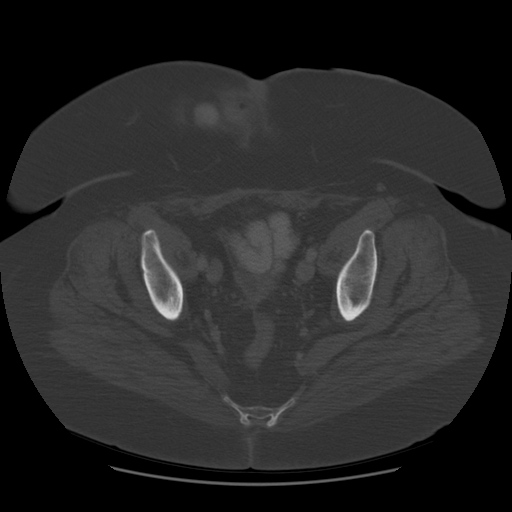
[im 37/93  soft-tissue]
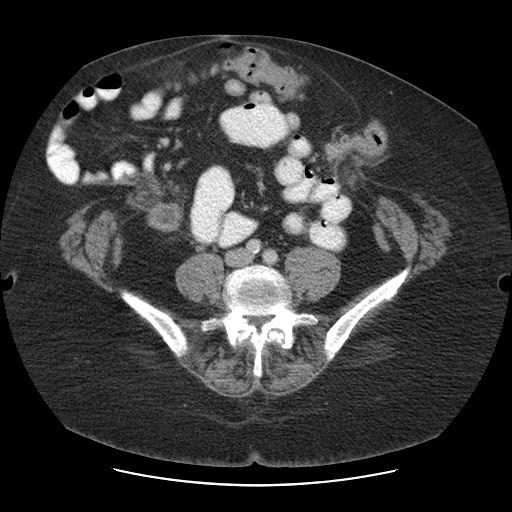
[im 37/93  lung]
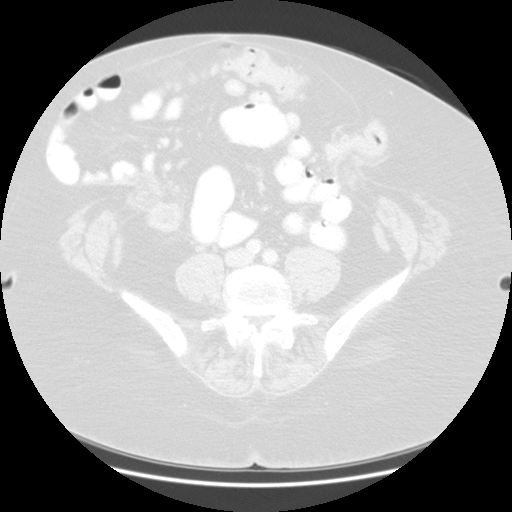
[im 56/93  soft-tissue]
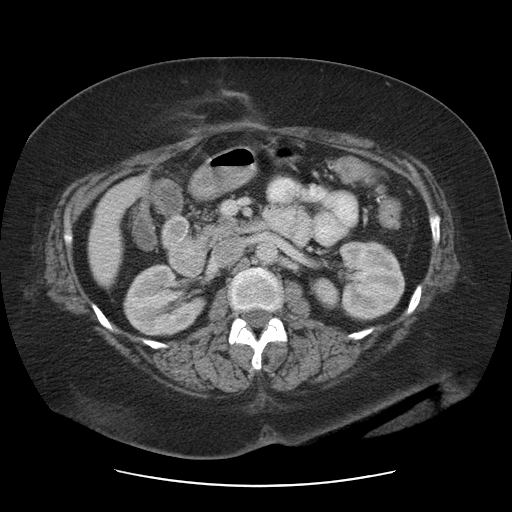
[im 56/93  lung]
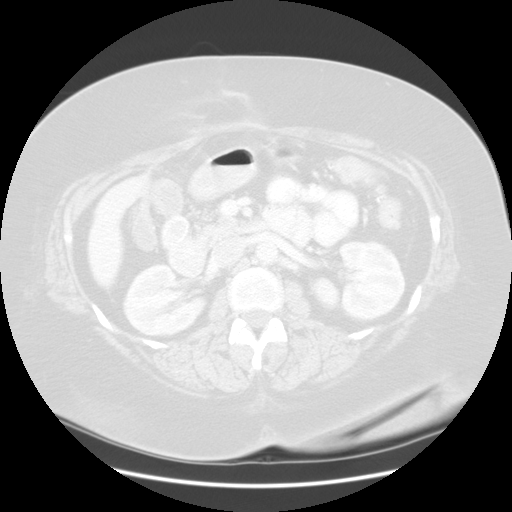
[im 74/93  soft-tissue]
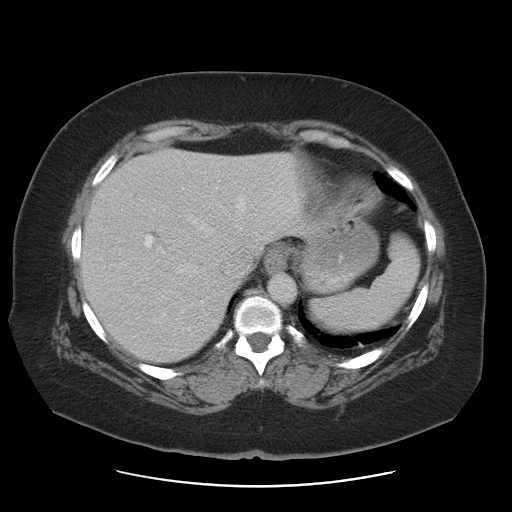
[im 74/93  lung]
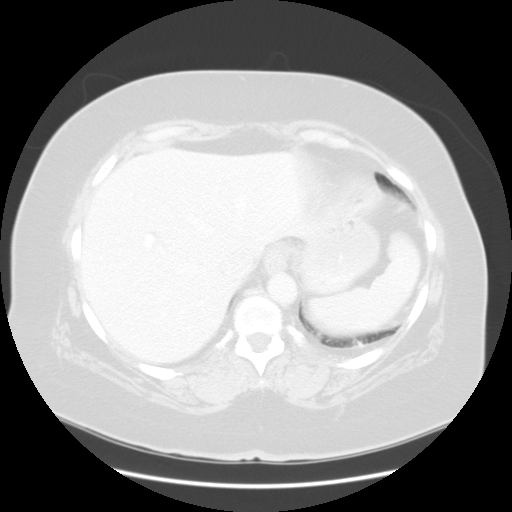

[Series 102: a&p w/ · axial · 0.78mm/px · z∈[-365,-172]mm · 5 of 323 slices shown (2 of 2)]
[im 31/323  soft-tissue]
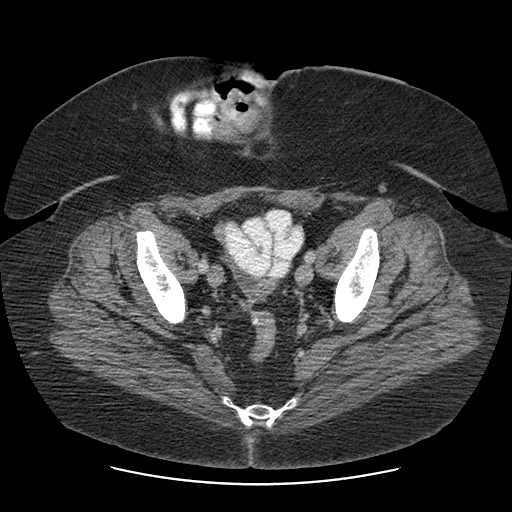
[im 62/323  soft-tissue]
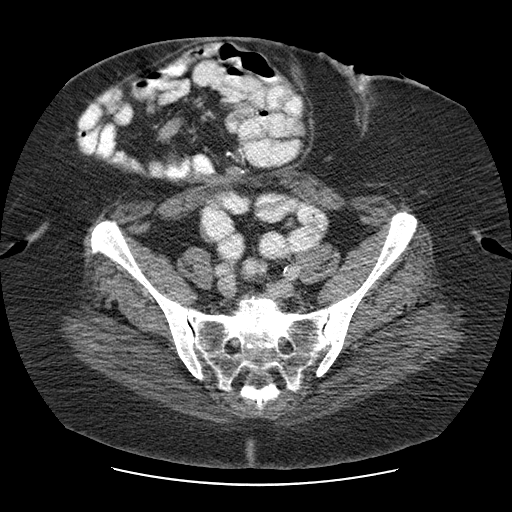
[im 108/323  soft-tissue]
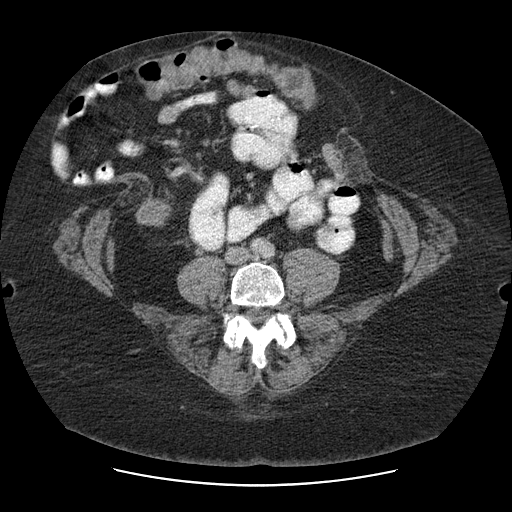
[im 139/323  soft-tissue]
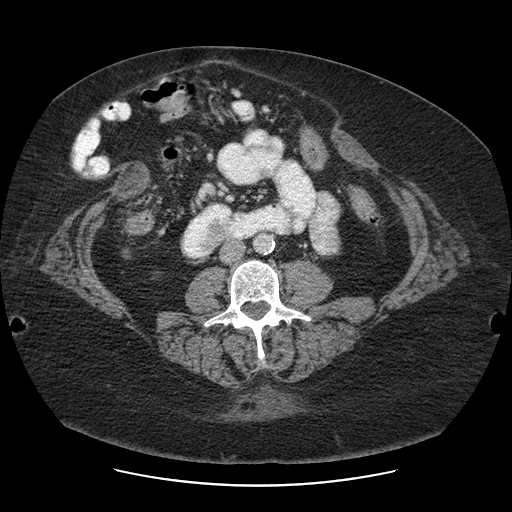
[im 185/323  soft-tissue]
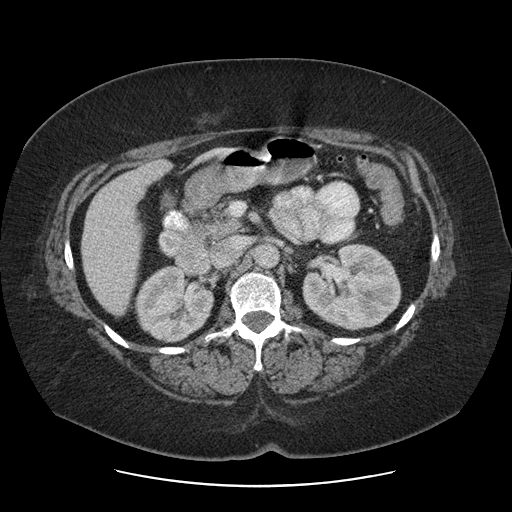

[Series 707: reformatted · sagittal · 0.92mm/px · 3 of 140 slices shown, 4 images]
[im 35/140  soft-tissue]
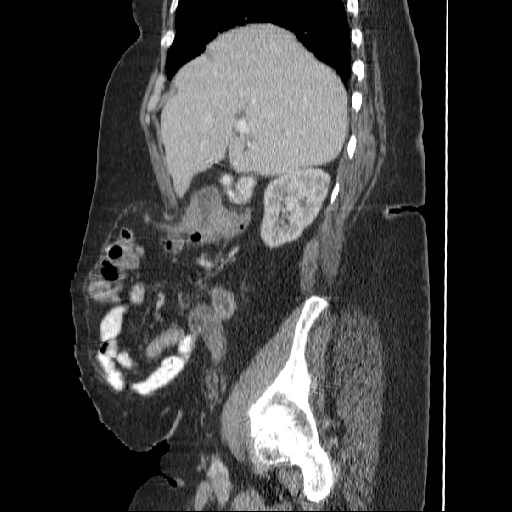
[im 70/140  soft-tissue]
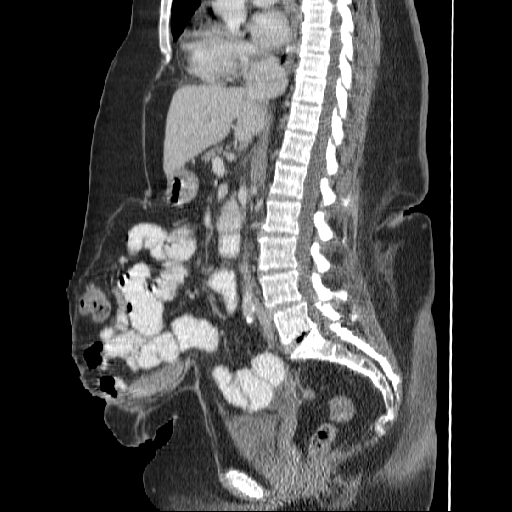
[im 70/140  bone]
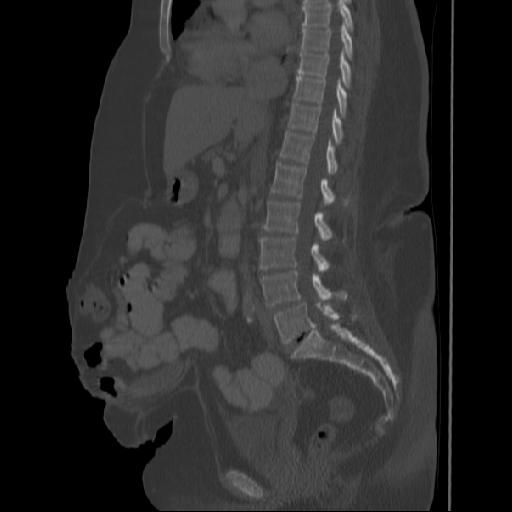
[im 105/140  soft-tissue]
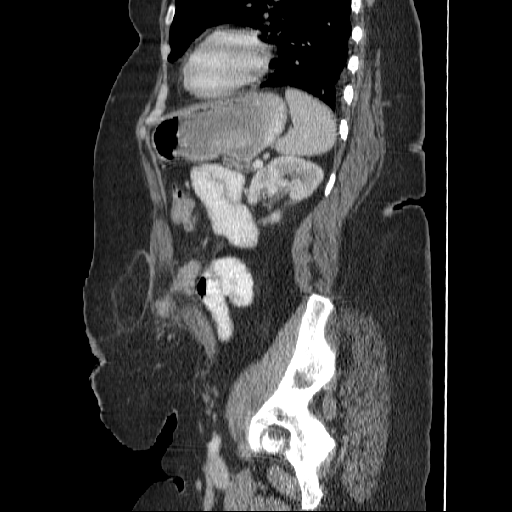

[12 of 42 positions shown; findings below may reference images not displayed]

FINDINGS: Since prior study patient is postcholecystectomy with no dilated biliary tract.  Interval large ventral hernia with facial defect measuring 15 cm contains non-obstructed, non-strangulated transverse colon and small bowel.  Minimal left basilar subsegmental atelectasis is seen with the lungs clear.  Slight calcification left anterior descending coronary artery with normal heart size is noted.  Slight atheromatous vascular calcification normal caliber abdominal aorta is seen.  Remaining abdominal organs appear normal without inflammation nor adenopathy.  Degenerative disc and facet changes are seen at L5-S1 and L-4.
IMPRESSION: 1.  Interval postcholecystectomy.
 2.  New large ventral hernia containing non-obstructed, non-strangulated small and large intestine. 
 3.  Vascular calcification including left anterior descending coronary artery. 
 4.  Degenerative disc and facet changes L4-5 and L5-S1. 
 PELVIS CT WITH CONTRAST:
FINDINGS: Resolution of previous sigmoid diverticulitis and colovesical fistula noted.  Left lower quadrant colostomy is seen which appears separate from the midline to right paramedian large ventral hernia.  Patient is posthysterectomy and sigmoid colonic resection with probable rectal Hartmann?s pouch.  Remaining pelvic structures are unremarkable with no inflammation, free fluid, nor adenopathy seen.
IMPRESSION: 1.  Left lower quadrant diverting colostomy with sigmoid resection and resolution of previous sigmoid diverticulitis, colovesical fistula. 
 2. Otherwise no significant abnormality.

## 2005-01-13 ENCOUNTER — Ambulatory Visit (HOSPITAL_COMMUNITY): Admission: RE | Admit: 2005-01-13 | Discharge: 2005-01-13 | Payer: Self-pay | Admitting: Gastroenterology

## 2005-01-13 ENCOUNTER — Encounter (INDEPENDENT_AMBULATORY_CARE_PROVIDER_SITE_OTHER): Payer: Self-pay | Admitting: *Deleted

## 2005-05-28 ENCOUNTER — Encounter: Payer: Self-pay | Admitting: *Deleted

## 2005-07-04 ENCOUNTER — Encounter: Admission: RE | Admit: 2005-07-04 | Discharge: 2005-07-04 | Payer: Self-pay | Admitting: Rheumatology

## 2005-07-04 IMAGING — CR DG LUMBAR SPINE COMPLETE 4+V
5 series · 5 of 5 positions shown · non-contrast
Comparison: none

CLINICAL DATA: Back pain, onset two weeks ago.
 LUMBAR SPINE:
 Four views of the lumbar spine were obtained.  There is degenerative disk disease at L4-5 and L5-S1.  At L4-5, there is slight anterolisthesis of L4 on L5 by 4 mm.  On oblique views, there is degenerative change involving the facet joints of L4-5 and L5-S1.  The SI joints appear normal.

[t l-spine a.p.]
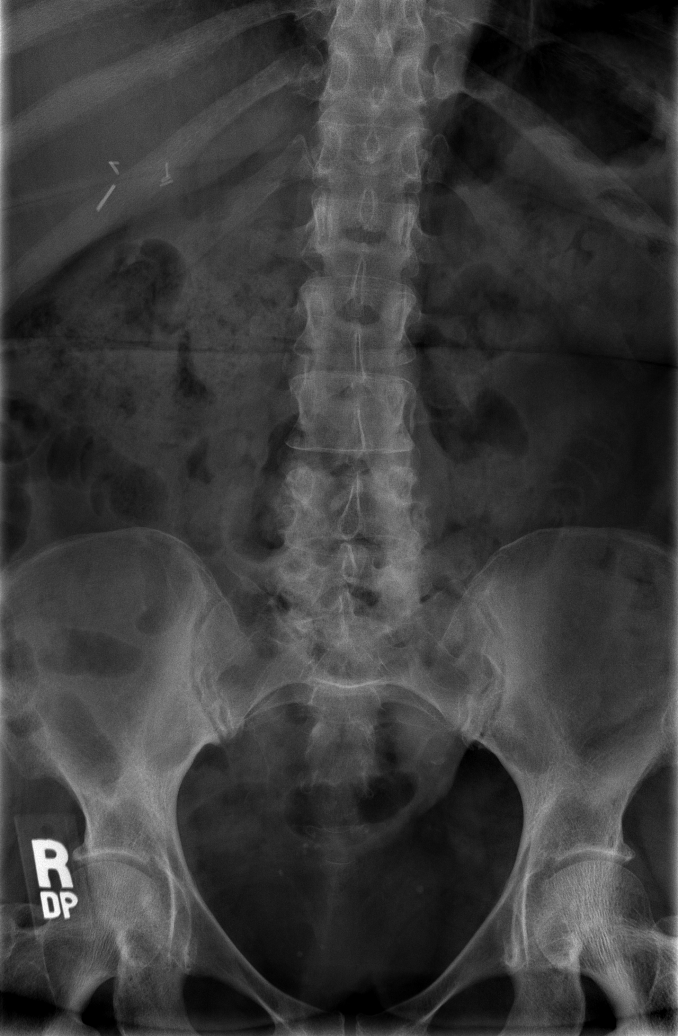

[t l-spine oblique exposure (1 of 2)]
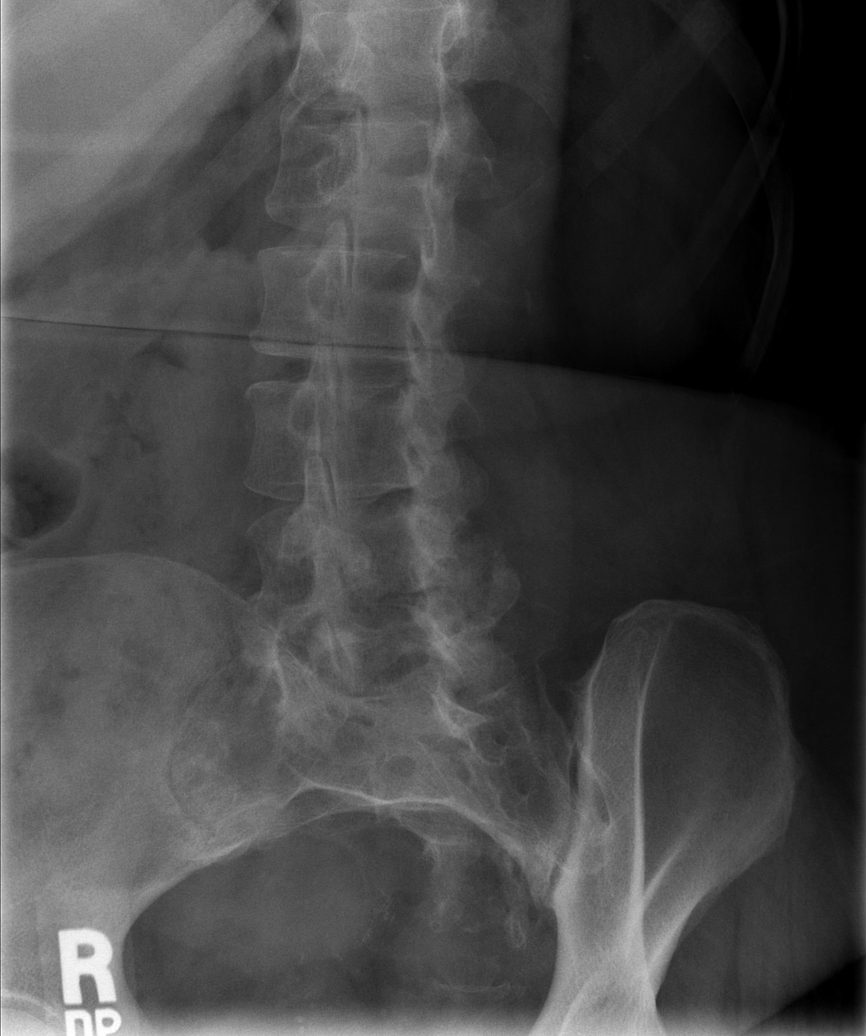

[t l-spine oblique exposure (2 of 2)]
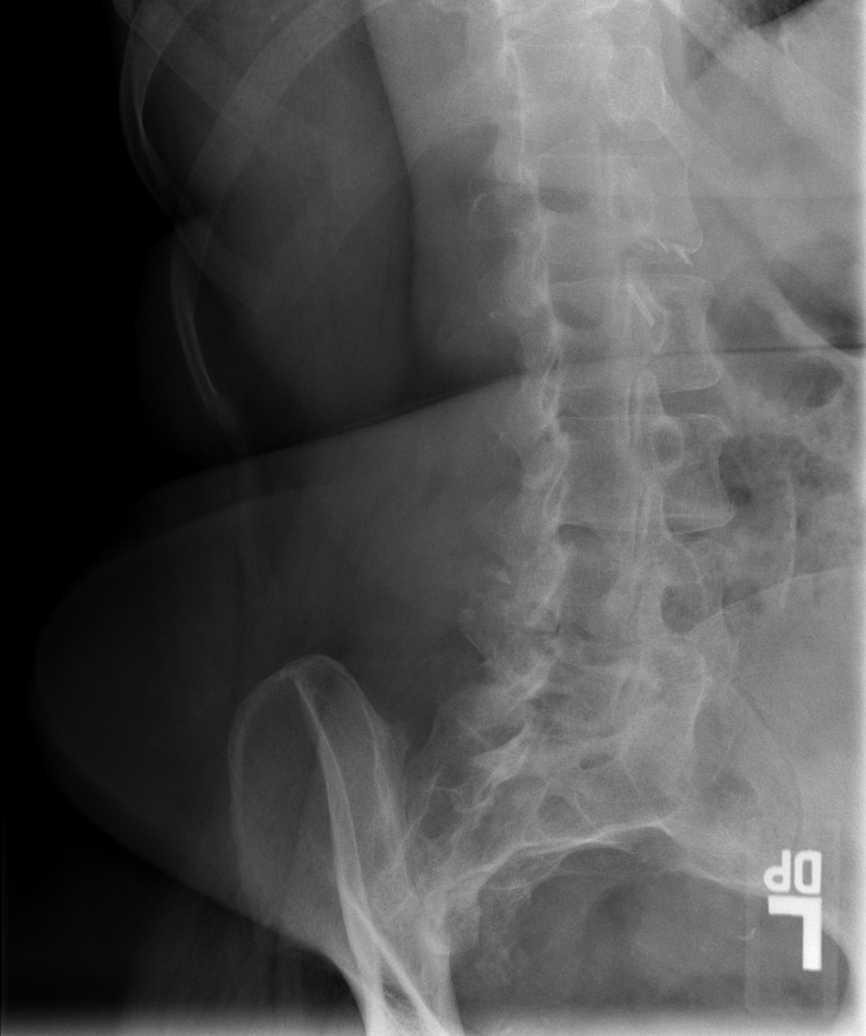

[t l-spine lat]
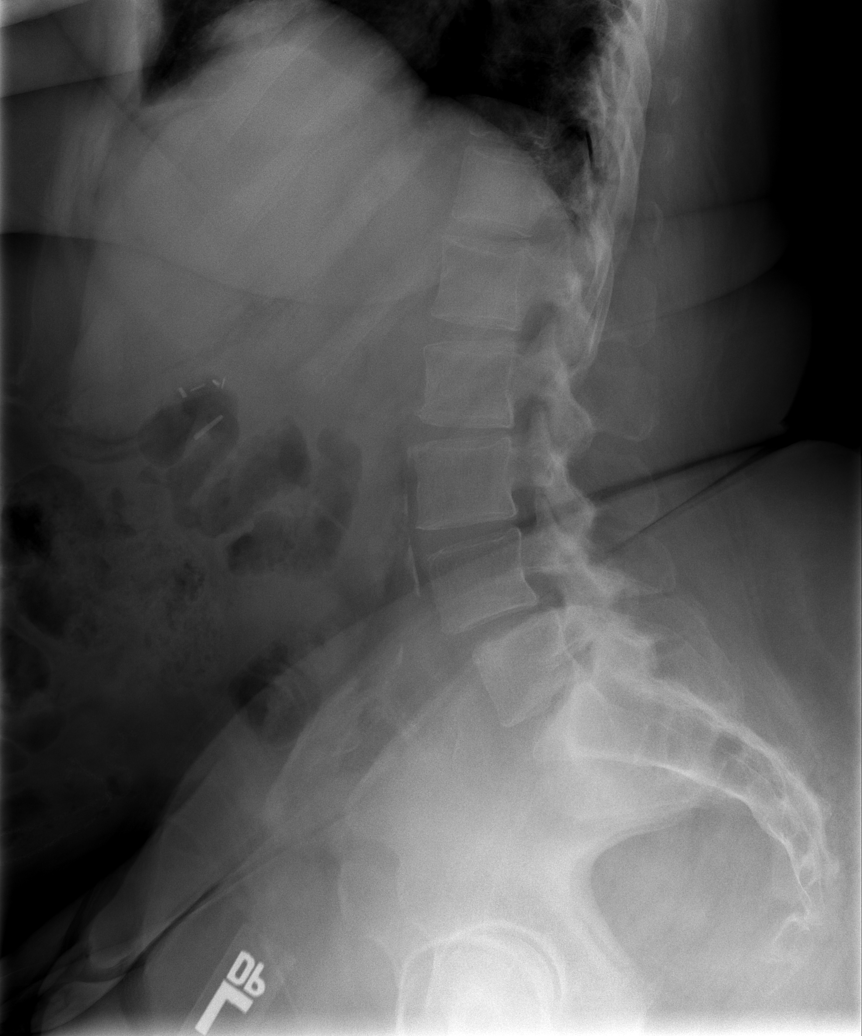

[t l-spine l5-s1 spot]
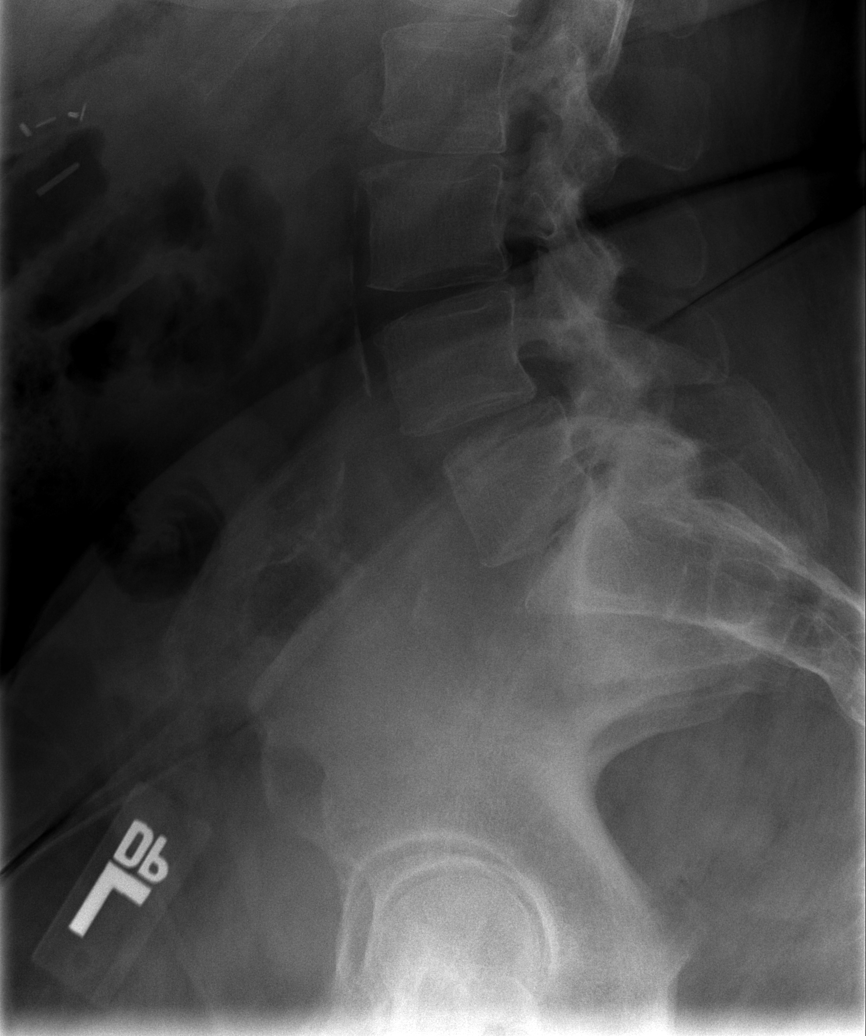

[5 of 5 positions shown; findings below may reference images not displayed]

IMPRESSION: Mild degenerative disk disease at L5-S1 and, to a lesser degree, L4-5, with 4 mm of anterolisthesis of L4 on L5.

## 2005-12-16 ENCOUNTER — Encounter: Admission: RE | Admit: 2005-12-16 | Discharge: 2005-12-16 | Payer: Self-pay | Admitting: Rheumatology

## 2005-12-16 IMAGING — CT CT ABDOMEN W/ CM
2 of 5 series · 17 of 46 positions shown, 19 images · IV contrast (READICAT/WATER & [ID] OMNI 300)
Comparison: [DATE]

ABDOMEN CT WITH CONTRAST:

CLINICAL DATA: Abdominal pain and swelling.
TECHNIQUE: Multidetector CT imaging of the abdomen and pelvis was performed
following the standard protocol during bolus administration of intravenous
contrast.

Contrast:  125 cc Omnipaque 300

[Series 3: routine abdomen · axial · 0.98mm/px · z∈[-394,+26]mm · 14 of 94 slices shown, 16 images]
[im 5/94  soft-tissue]
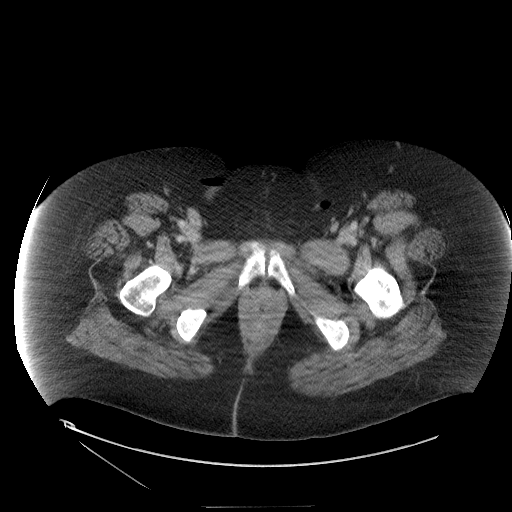
[im 5/94  bone]
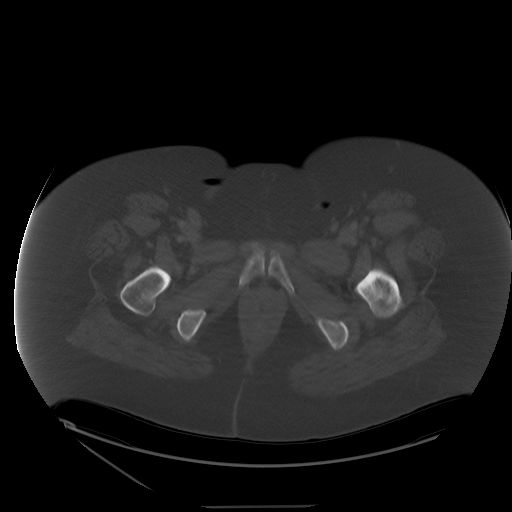
[im 10/94  soft-tissue]
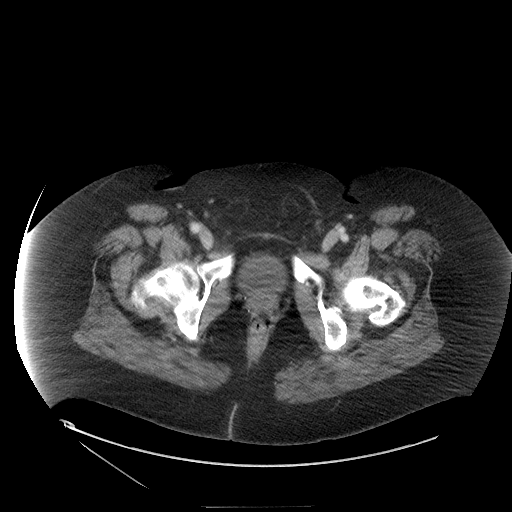
[im 20/94  soft-tissue]
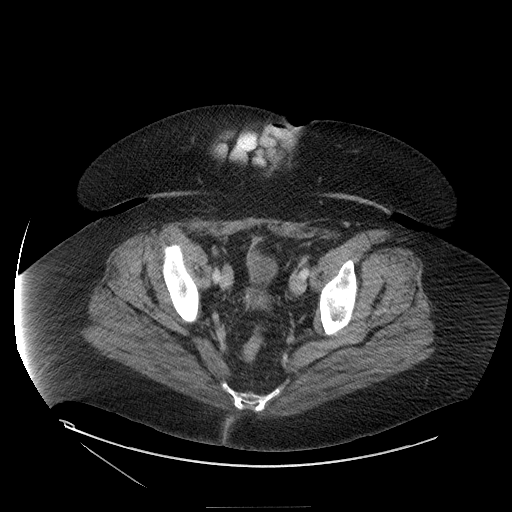
[im 25/94  soft-tissue]
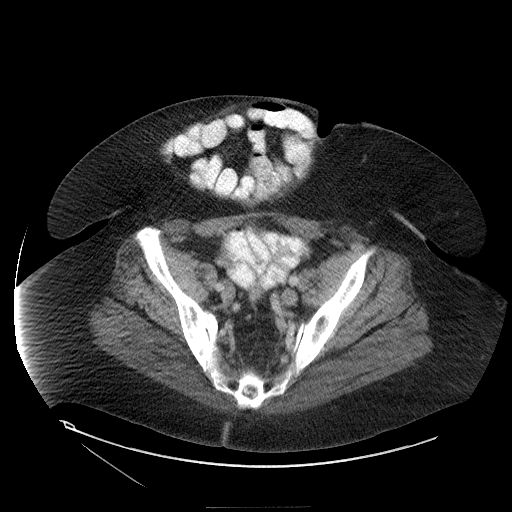
[im 30/94  soft-tissue]
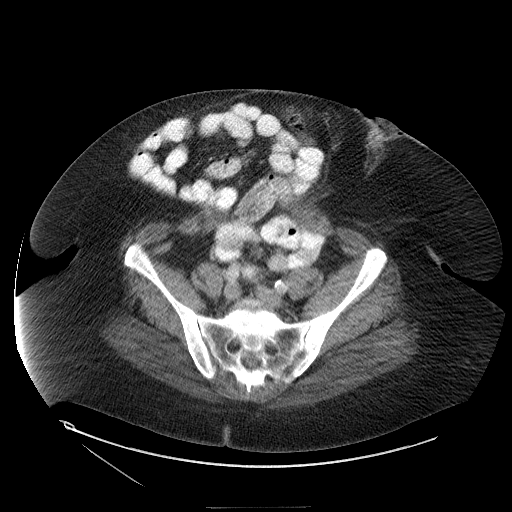
[im 40/94  soft-tissue]
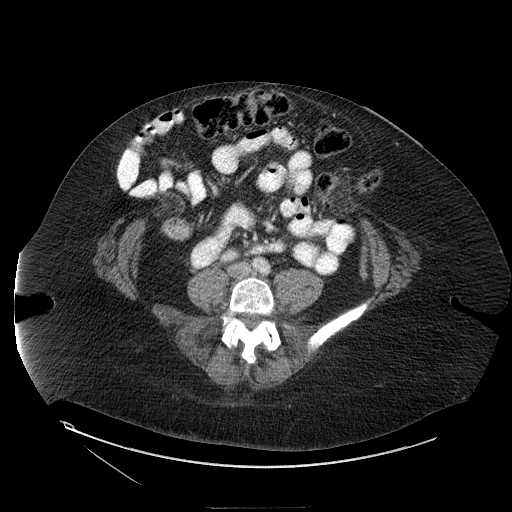
[im 45/94  soft-tissue]
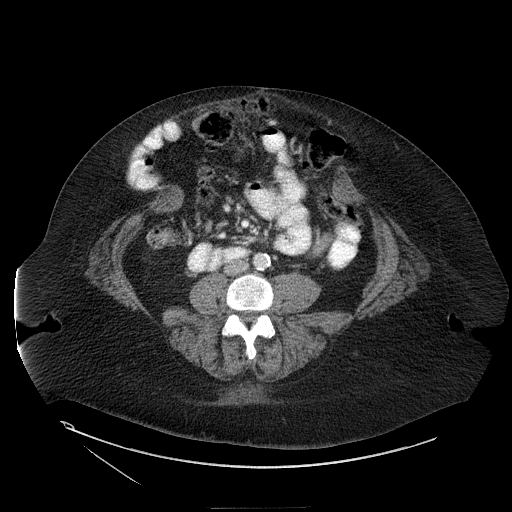
[im 49/94  soft-tissue]
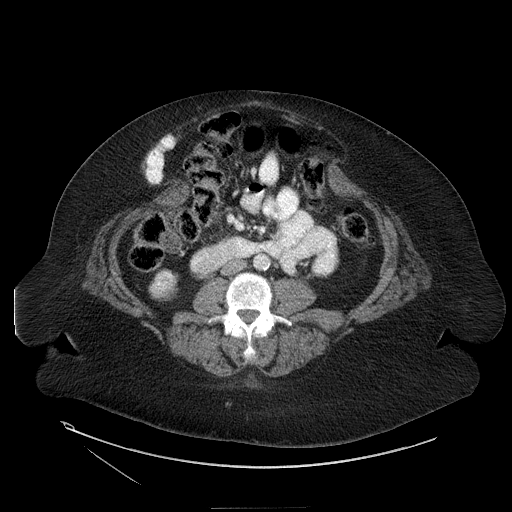
[im 54/94  soft-tissue]
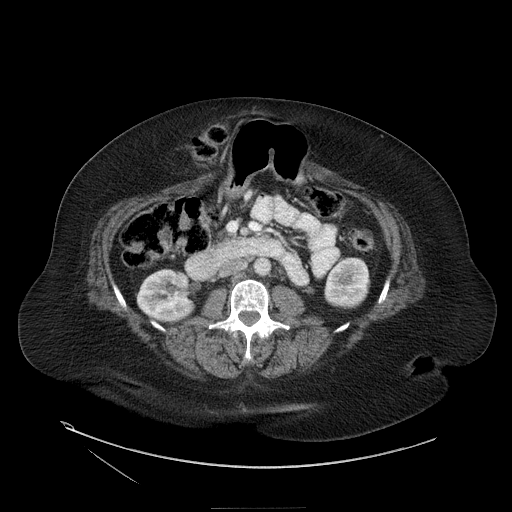
[im 54/94  bone]
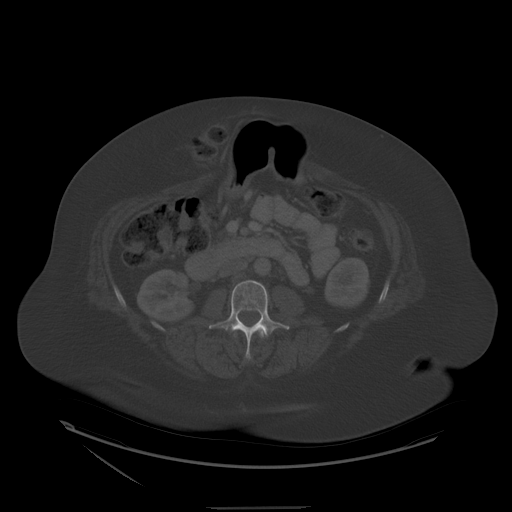
[im 64/94  soft-tissue]
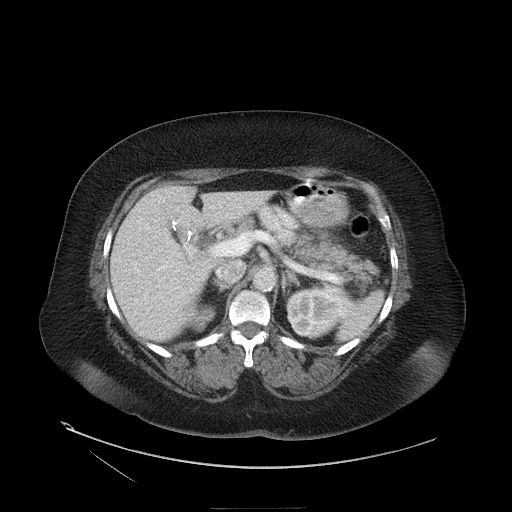
[im 69/94  soft-tissue]
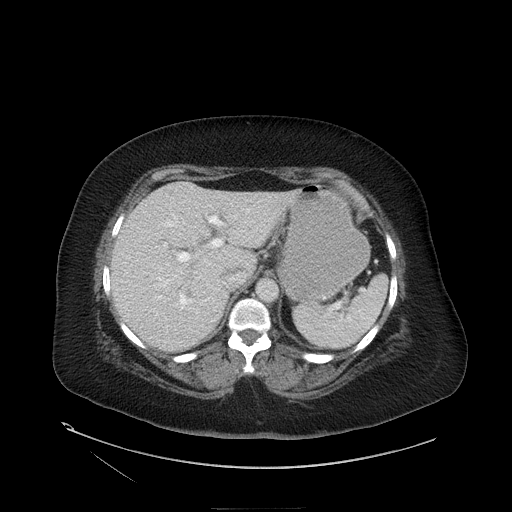
[im 74/94  soft-tissue]
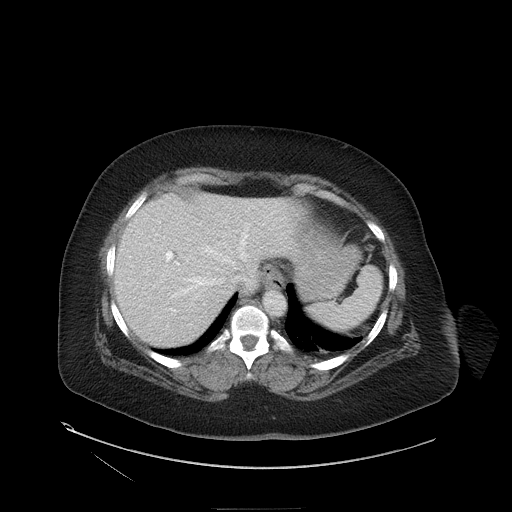
[im 84/94  soft-tissue]
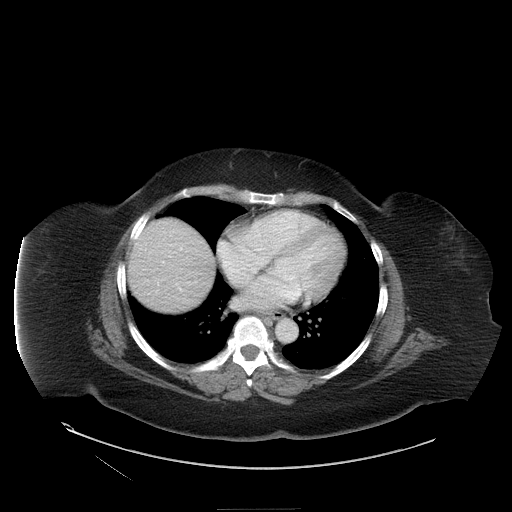
[im 89/94  soft-tissue]
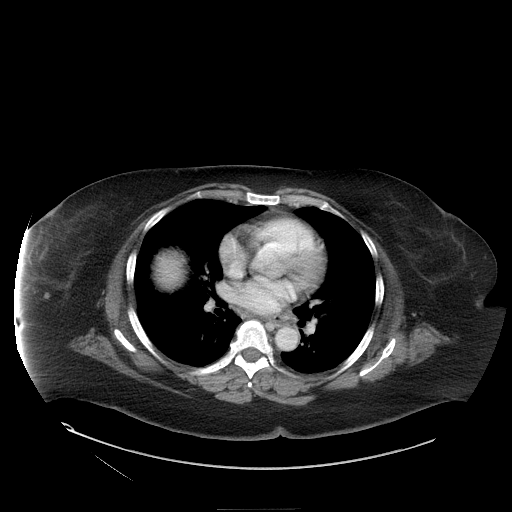

[Series 602: sagittal body · sagittal · 0.98mm/px · 3 of 196 slices shown]
[im 66/196  soft-tissue]
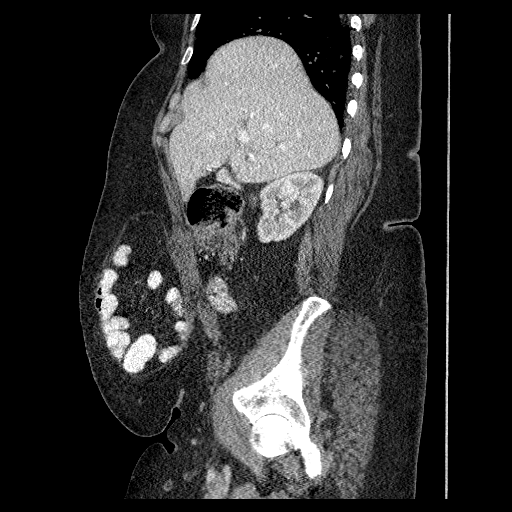
[im 87/196  soft-tissue]
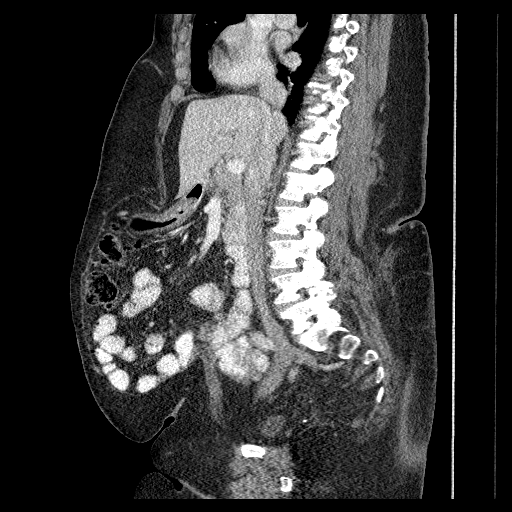
[im 109/196  soft-tissue]
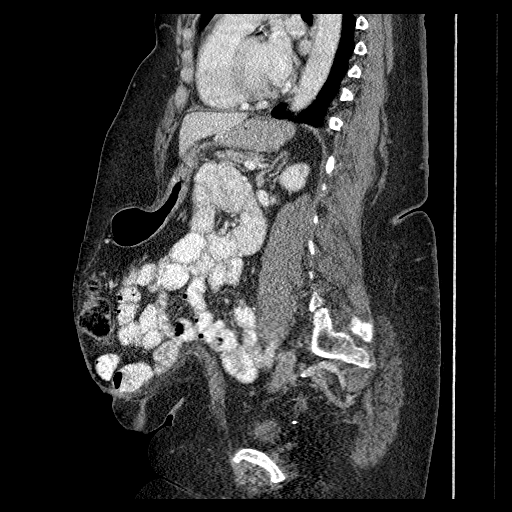

[17 of 46 positions shown; findings below may reference images not displayed]

FINDINGS: No focal abnormality seen in the liver spleen. The stomach extends
into a ventral line defect. This large anterior midline defect in the anterior
abdominal wall contains stomach, colon, and small bowel and appears to have a
surrounding peritoneal lining suggesting that the vast majority of this
appearance is related to fascial laxity. There is a portion overlying some small
bowel loops on the right in which the fascia cannot be discretely identified and
a fascial defect at this level cannot be completely excluded.

The pancreas, adrenal glands, and kidneys are unremarkable. The patient is
status post cholecystectomy.
IMPRESSION: Large bulge in the ventral abdominal wall is mainly related to substantial
fascial laxity as overlying peritoneal layer is present over much of the bulging
intra-abdominal contents. There is an area in the right side of the bulge in
which the fascia cannot be discretely identified and a hernia at this level
cannot be excluded. No associated free fluid. No bowel obstruction.

PELVIS CT WITH CONTRAST:
FINDINGS: No intraperitoneal free fluid. Patient is status post hysterectomy.
There are no adnexal masses. No evidence for pelvic lymphadenopathy.

A left lower quadrant sigmoid colostomy is noted with a Hartmann's pouch in the
anatomic pelvis.
IMPRESSION: No acute findings in the anatomic pelvis.

## 2006-04-23 ENCOUNTER — Emergency Department (HOSPITAL_COMMUNITY): Admission: EM | Admit: 2006-04-23 | Discharge: 2006-04-23 | Payer: Self-pay | Admitting: Family Medicine

## 2006-04-23 IMAGING — CR DG CHEST 2V
2 series · 2 of 2 positions shown · non-contrast
Comparison: CT dated [DATE].

CLINICAL DATA: SOB and cough. 
 CHEST ? 2 VIEW:

[view not recorded (1 of 2)]
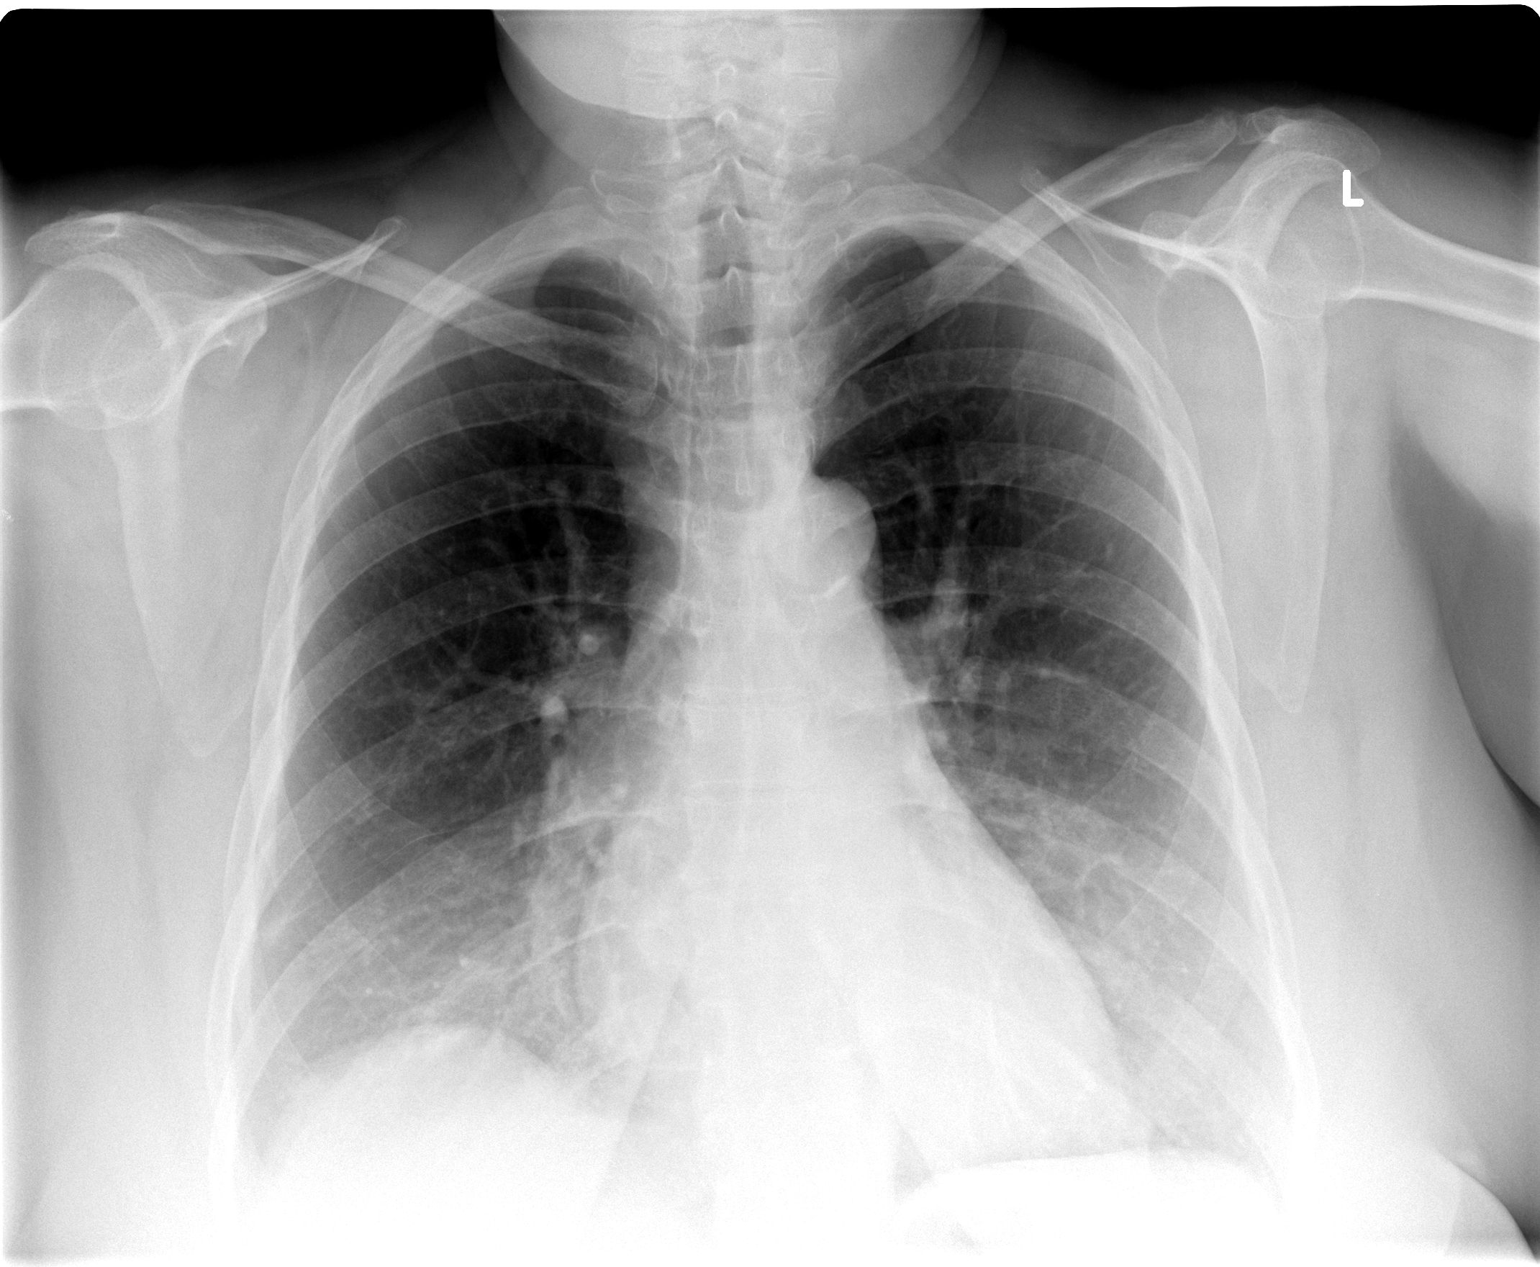

[view not recorded (2 of 2)]
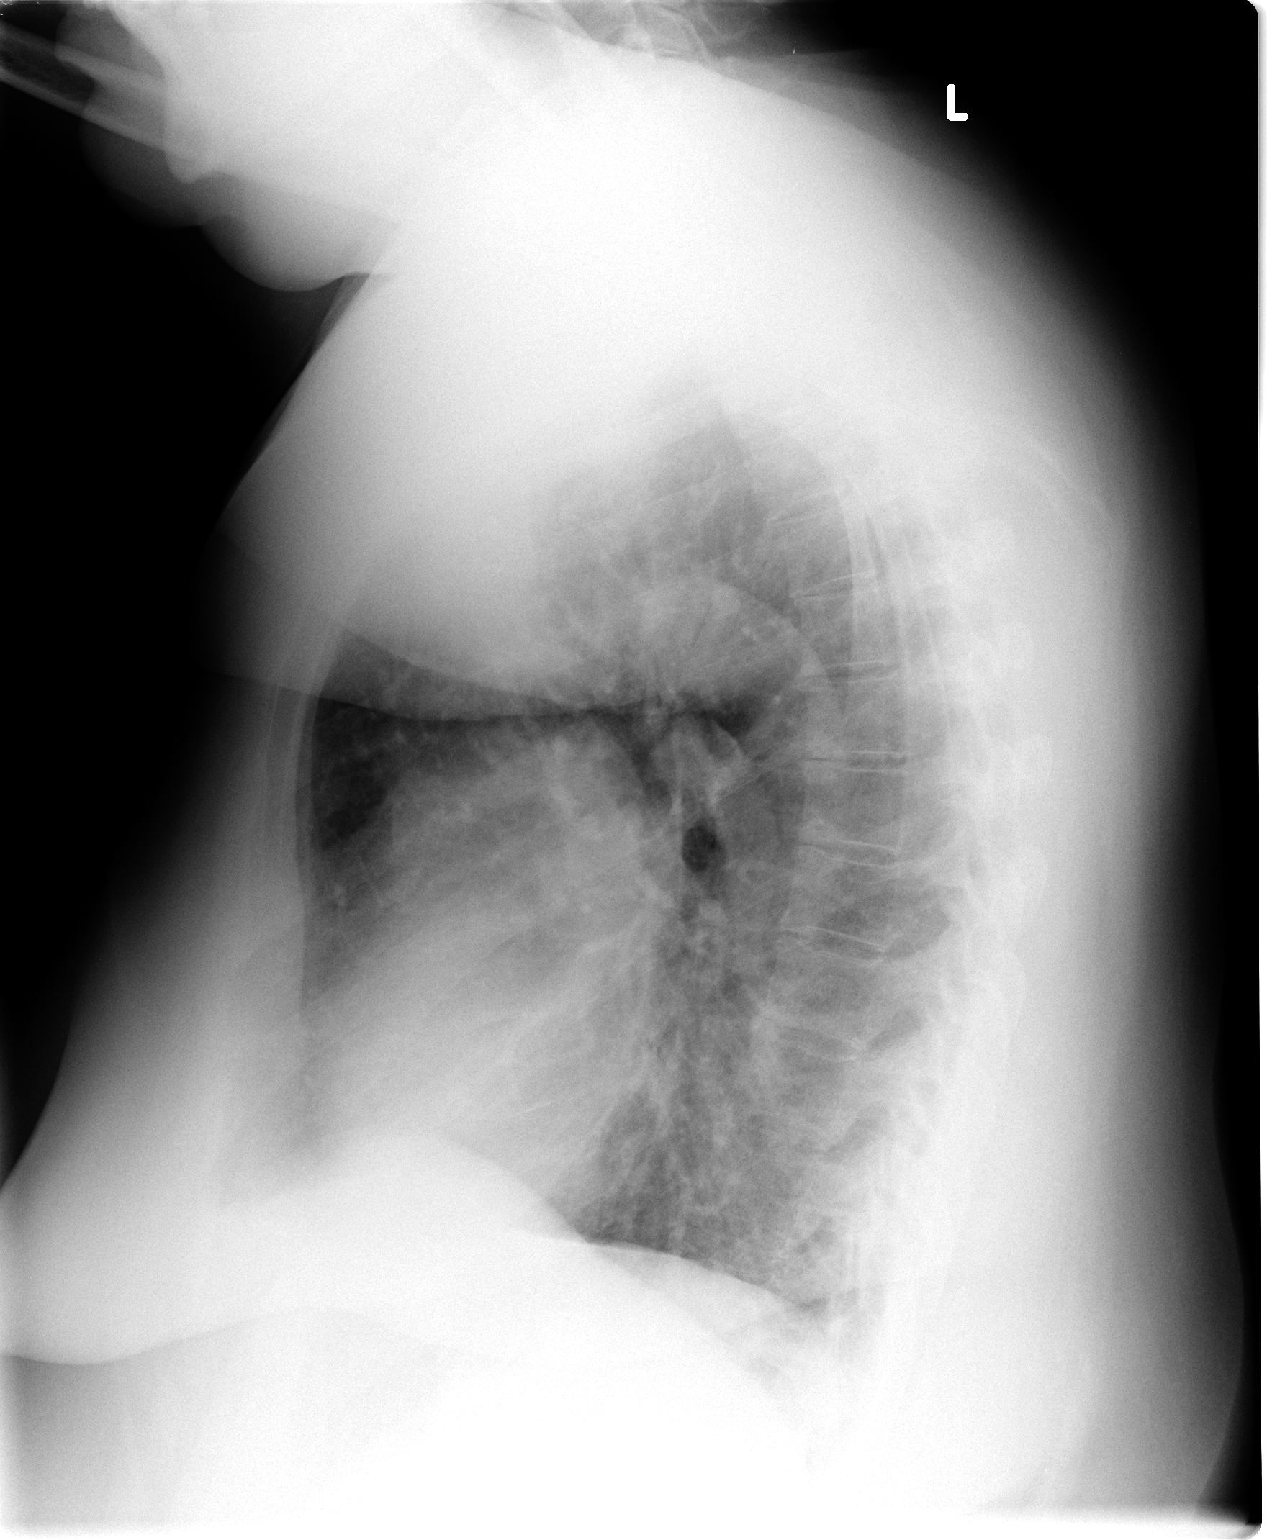

[2 of 2 positions shown; findings below may reference images not displayed]

FINDINGS: Heart size is minimally enlarged.  There are no effusions or edema.  
 No airspace opacities are identified.  There is mild atelectasis at both lung bases.
IMPRESSION: Mild basilar atelectasis.

## 2006-08-10 ENCOUNTER — Encounter: Admission: RE | Admit: 2006-08-10 | Discharge: 2006-08-10 | Payer: Self-pay | Admitting: Orthopedic Surgery

## 2006-08-10 IMAGING — CR DG KNEE 1-2V*R*
3 series · 3 of 3 positions shown · non-contrast
Comparison: None.

CLINICAL DATA: Right knee pain and swelling.  
 RIGHT KNEE - 2 VIEW:

[w knee ap right]
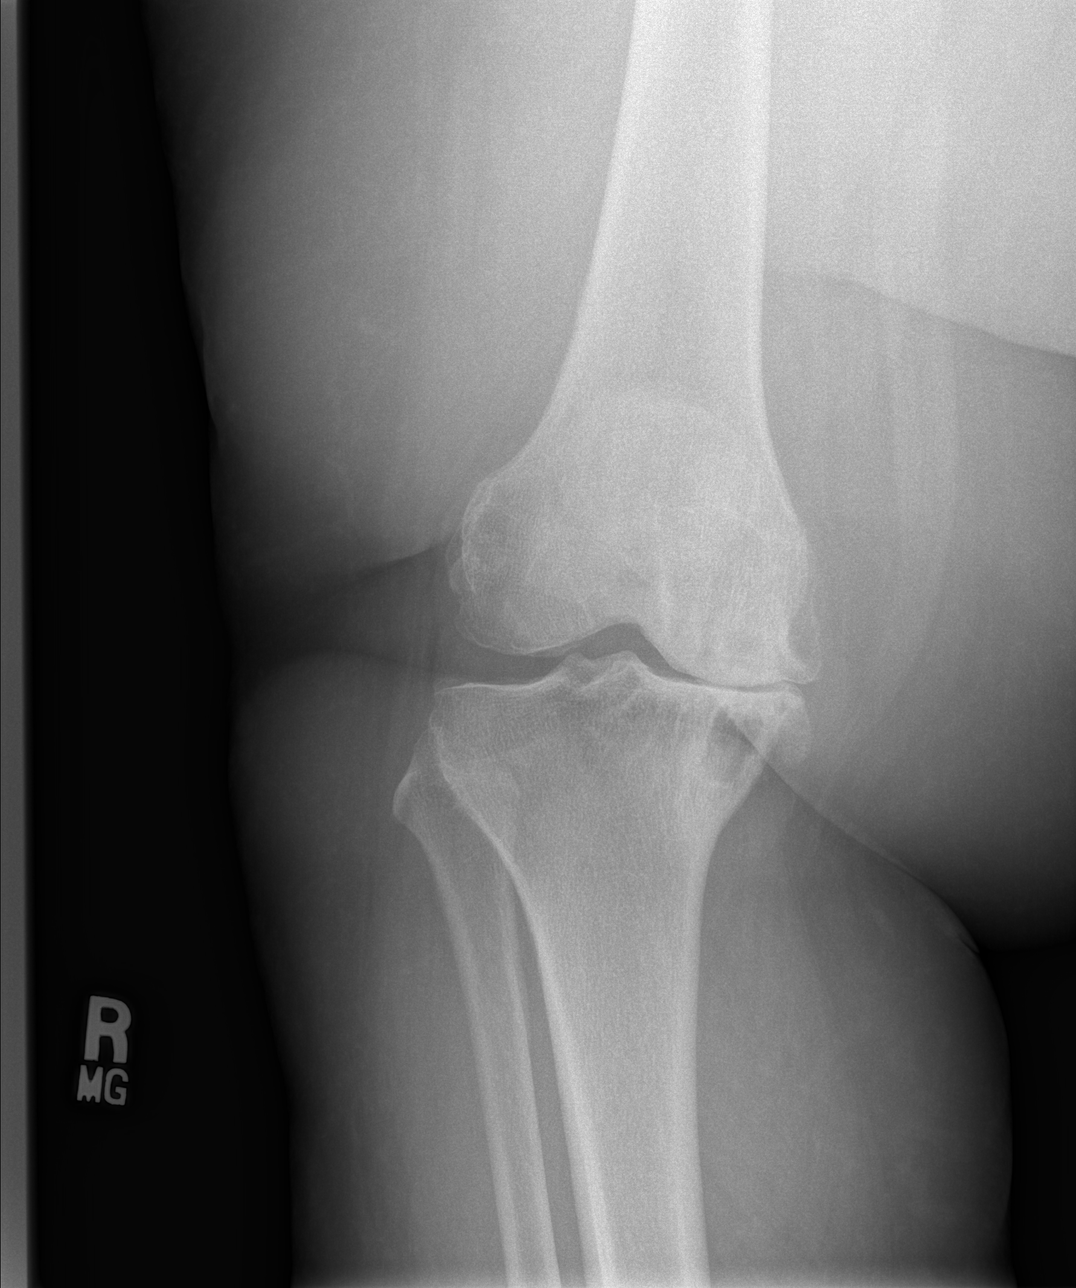

[w knee lat. right (1 of 2)]
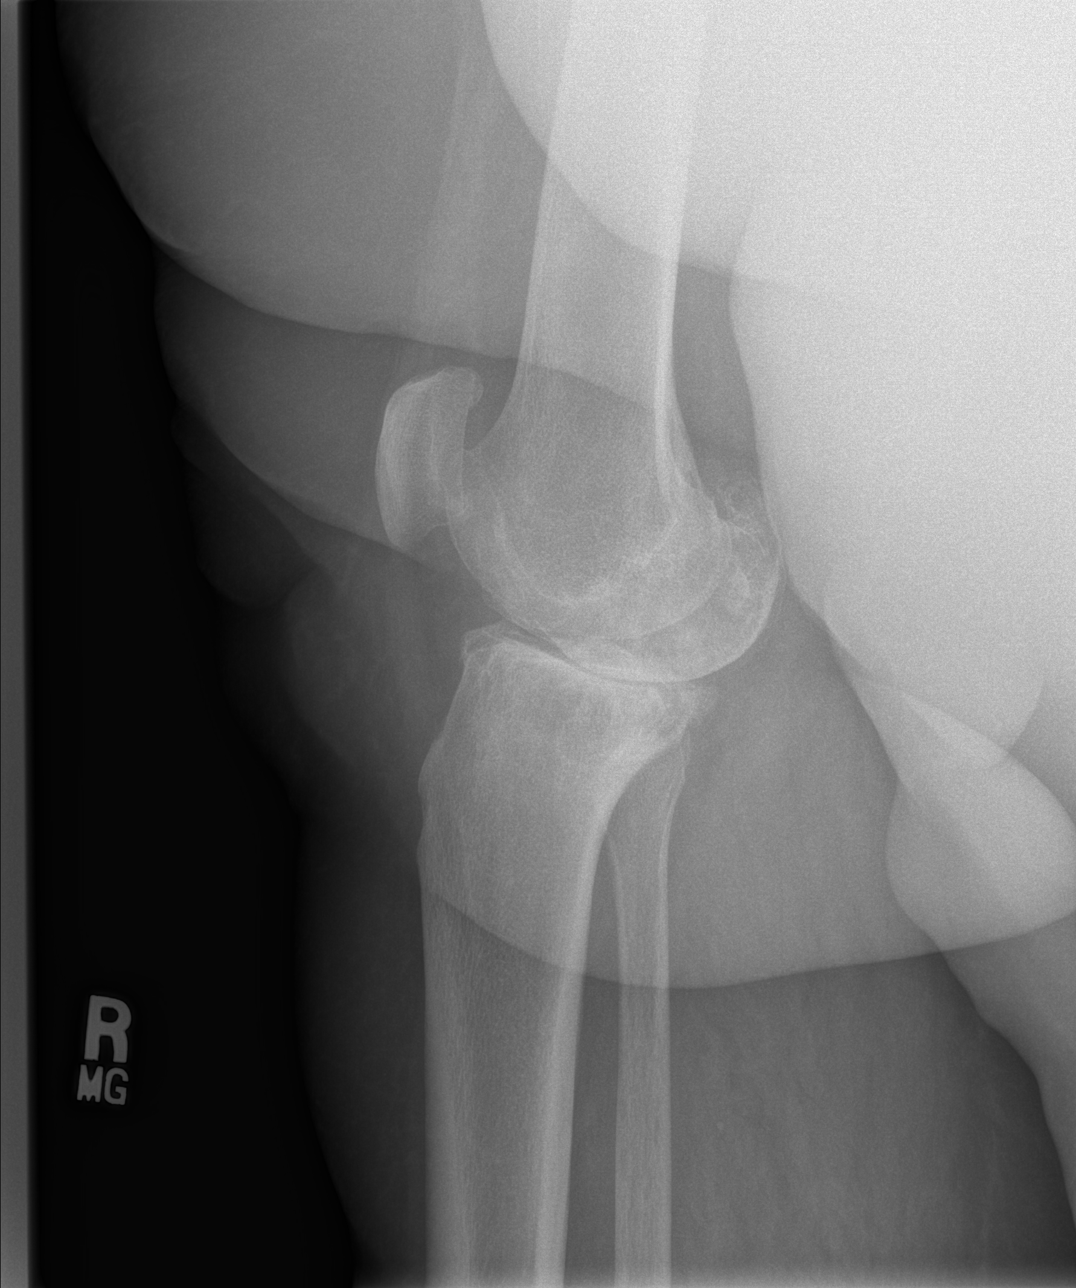

[w knee lat. right (2 of 2)]
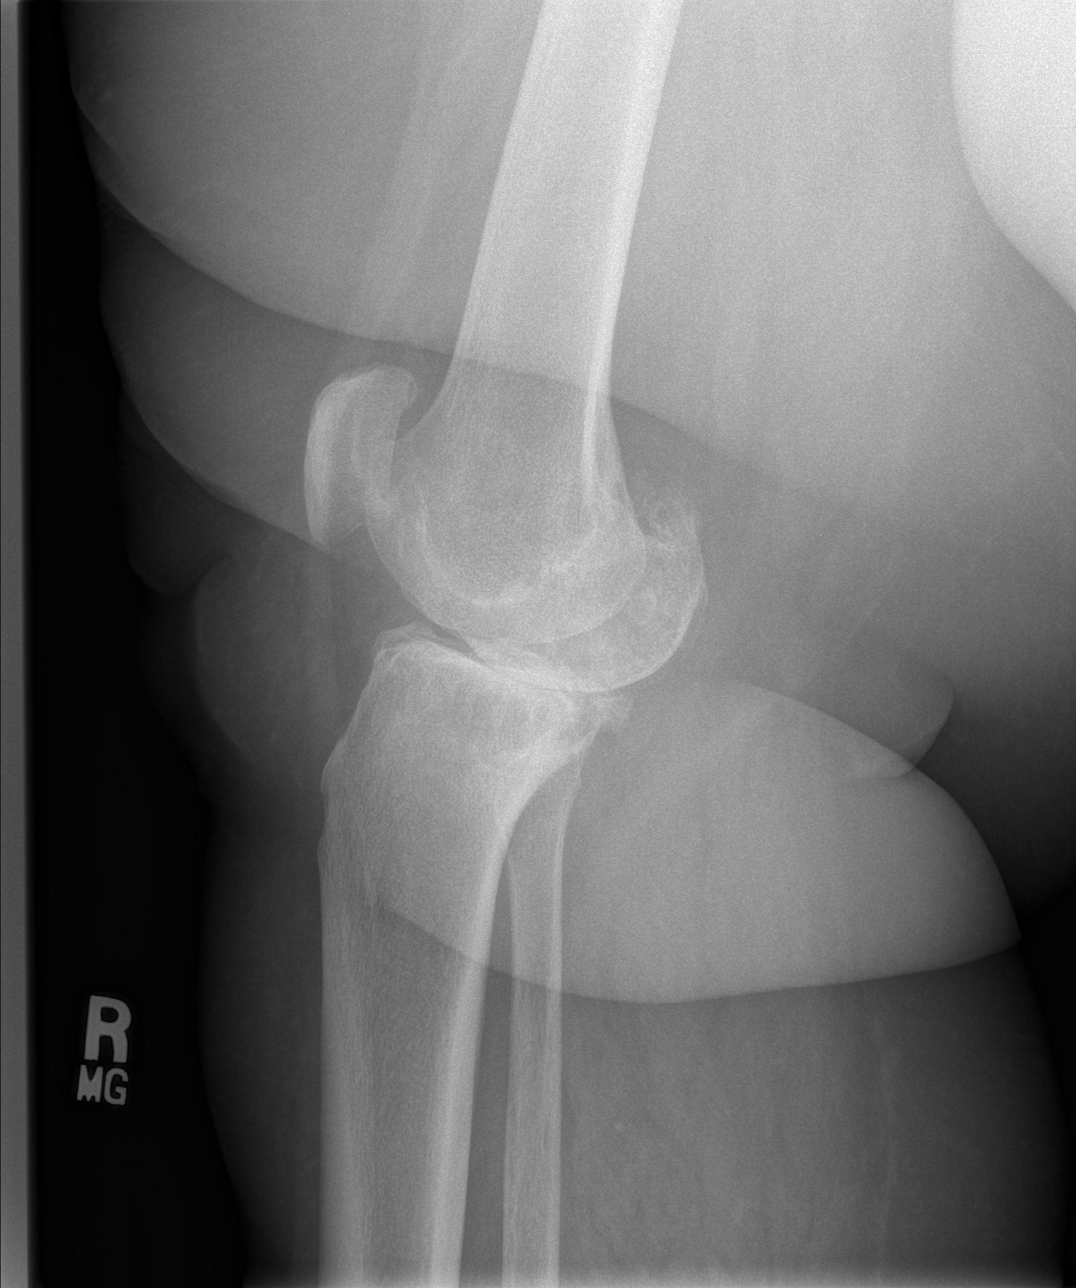

[3 of 3 positions shown; findings below may reference images not displayed]

FINDINGS: There is marked loss of joint space in the medial compartment with subchondral sclerosis and cyst formation.  A 1.9 x 1.1 cm subchondral cyst is seen in the medial tibial plateau.   There is slight lateral subluxation of the tibia with respective to the femur.  Marked osteophytosis in the medial compartment with less severe osteophytosis in the lateral and patellofemoral compartments.  Chondrocalcinosis.  No joint effusion or fracture.
IMPRESSION: Marked osteoarthritis, affecting the medial compartment most severely.

## 2006-08-25 ENCOUNTER — Encounter (HOSPITAL_COMMUNITY): Admission: RE | Admit: 2006-08-25 | Discharge: 2006-11-23 | Payer: Self-pay | Admitting: Cardiology

## 2006-09-01 ENCOUNTER — Ambulatory Visit (HOSPITAL_COMMUNITY): Admission: RE | Admit: 2006-09-01 | Discharge: 2006-09-01 | Payer: Self-pay | Admitting: Cardiology

## 2006-09-23 IMAGING — CR DG CHEST 2V
2 series · 2 of 2 positions shown · non-contrast
Comparison: [DATE] and CT chest of [DATE].

CLINICAL DATA: Preop, right total knee arthroplasty.  Productive cough and congestion.
 CHEST ? 2 VIEW:

[view not recorded (1 of 2)]
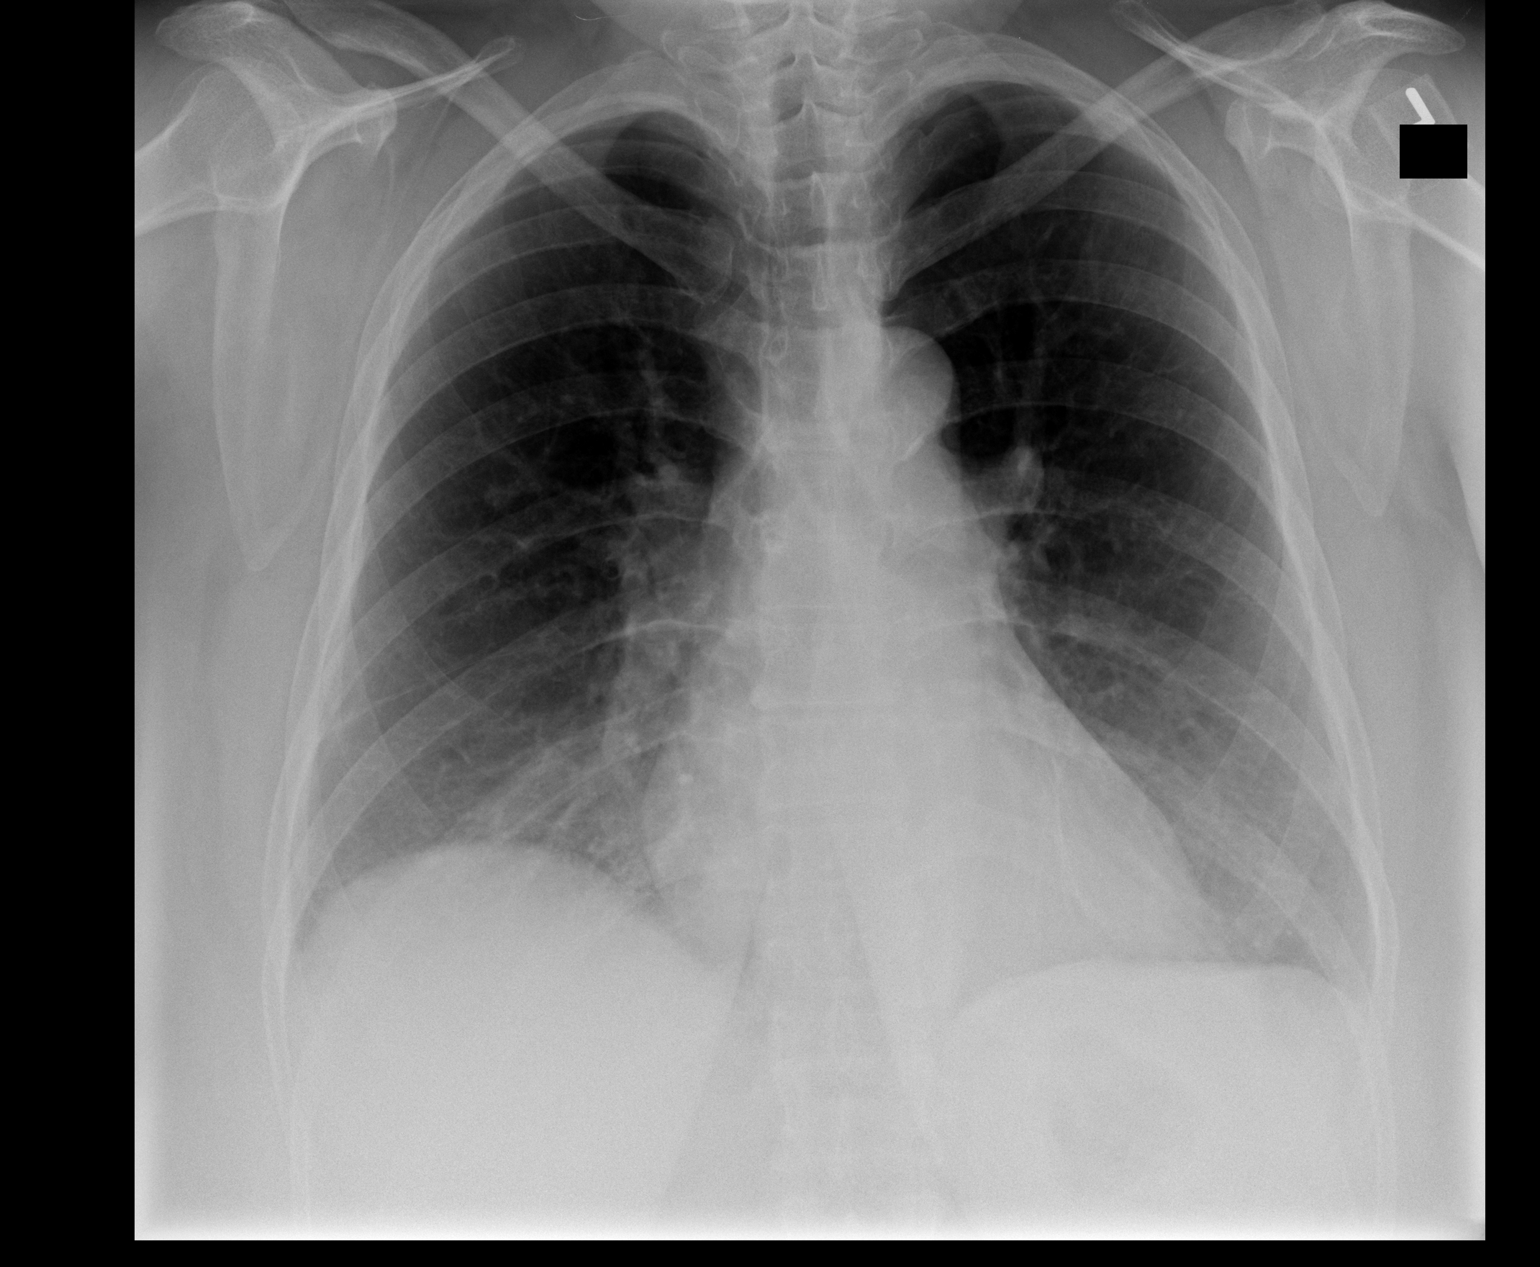

[view not recorded (2 of 2)]
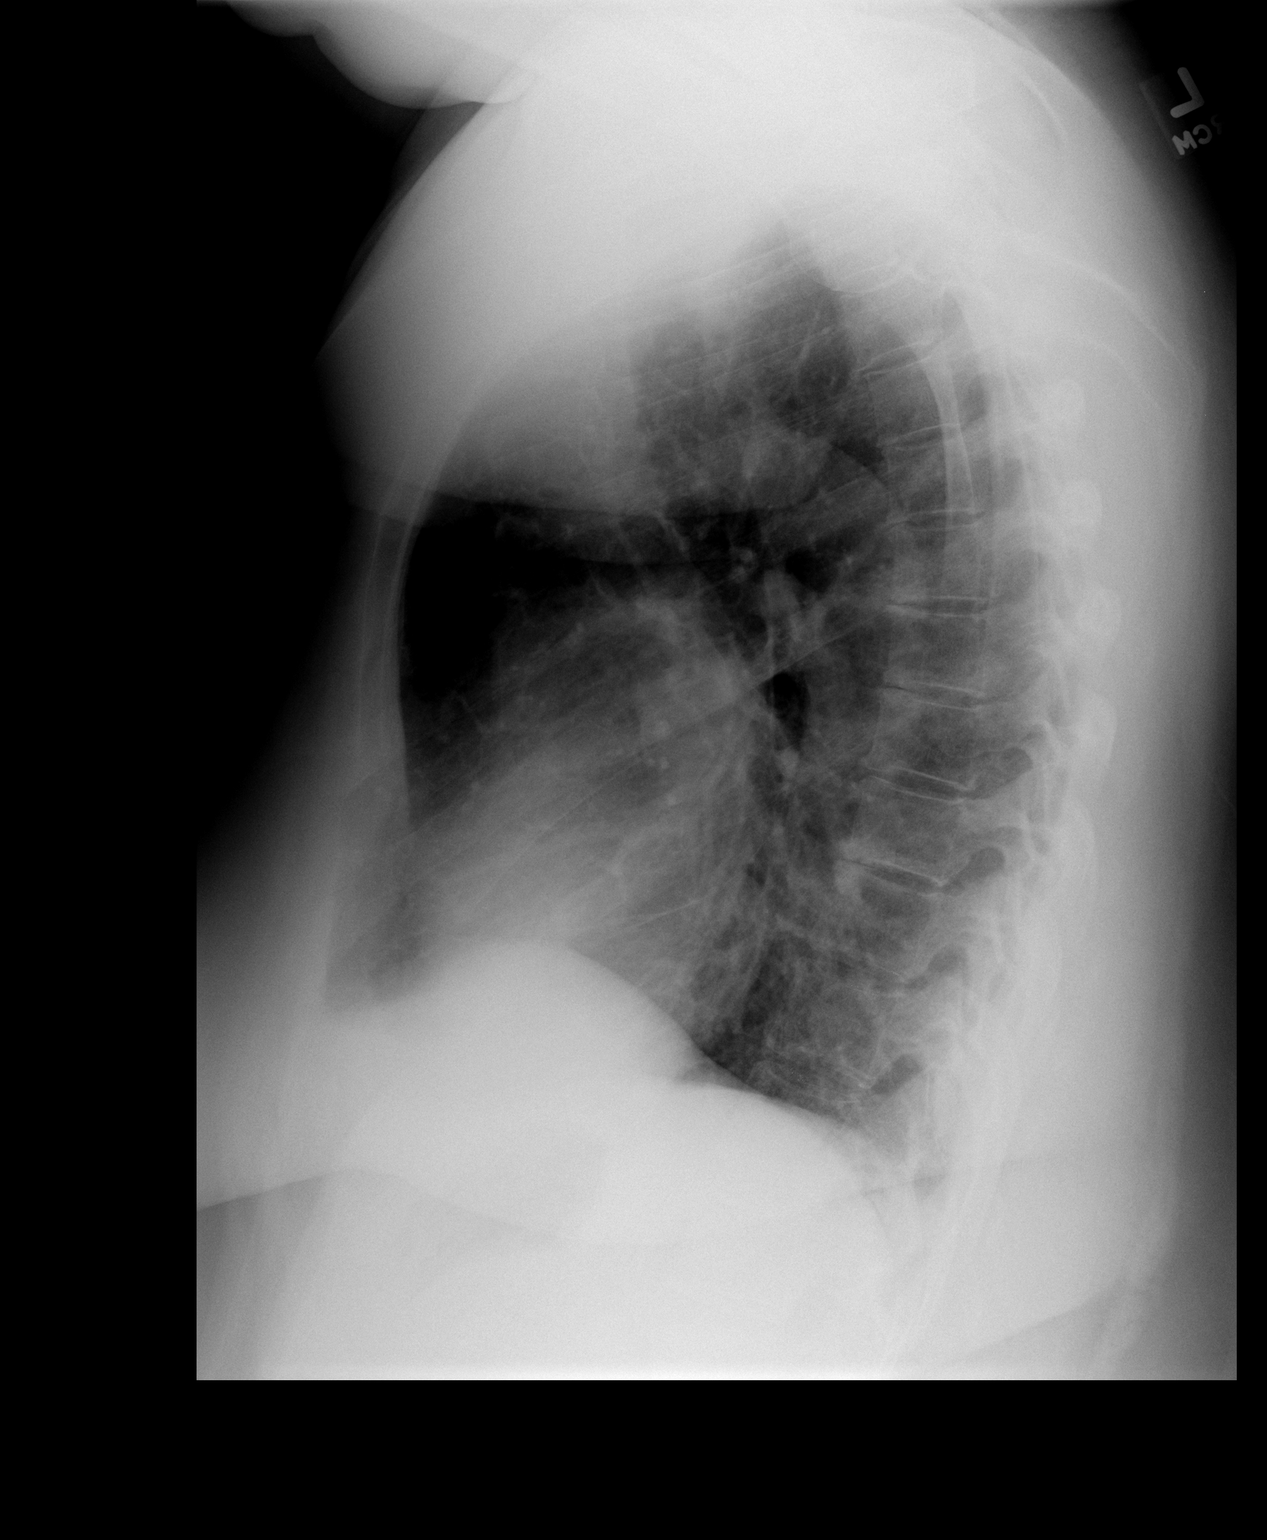

[2 of 2 positions shown; findings below may reference images not displayed]

FINDINGS: Trachea is midline.  Heart size is stable.  Lungs are clear.  No pleural fluid.
IMPRESSION: No acute findings.

## 2006-09-29 ENCOUNTER — Inpatient Hospital Stay (HOSPITAL_COMMUNITY): Admission: RE | Admit: 2006-09-29 | Discharge: 2006-10-03 | Payer: Self-pay | Admitting: Orthopedic Surgery

## 2007-09-21 ENCOUNTER — Emergency Department (HOSPITAL_COMMUNITY): Admission: EM | Admit: 2007-09-21 | Discharge: 2007-09-21 | Payer: Self-pay | Admitting: Emergency Medicine

## 2008-04-18 ENCOUNTER — Encounter: Admission: RE | Admit: 2008-04-18 | Discharge: 2008-04-18 | Payer: Self-pay | Admitting: Orthopedic Surgery

## 2008-04-18 IMAGING — CR DG OS CALCIS 2+V*R*
2 series · 2 of 2 positions shown · non-contrast
Comparison: None.

CLINICAL DATA: Question foreign body medial aspect right heel.  No
known injury.

RIGHT OS CALCIS - 2+ VIEW

[t calcaneus axial right]
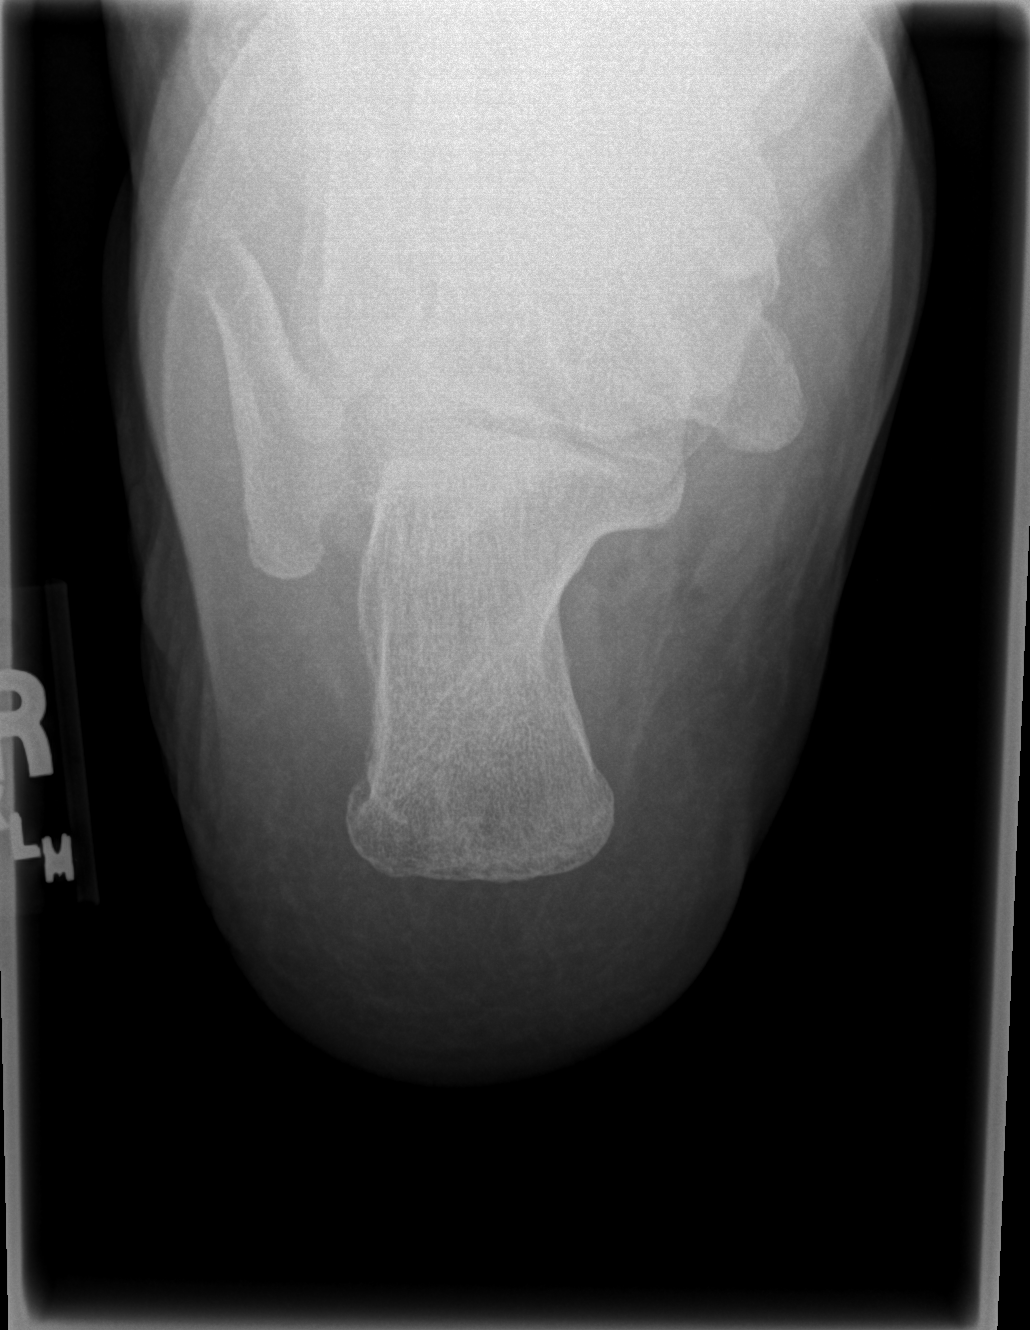

[t calcaneus lat right]
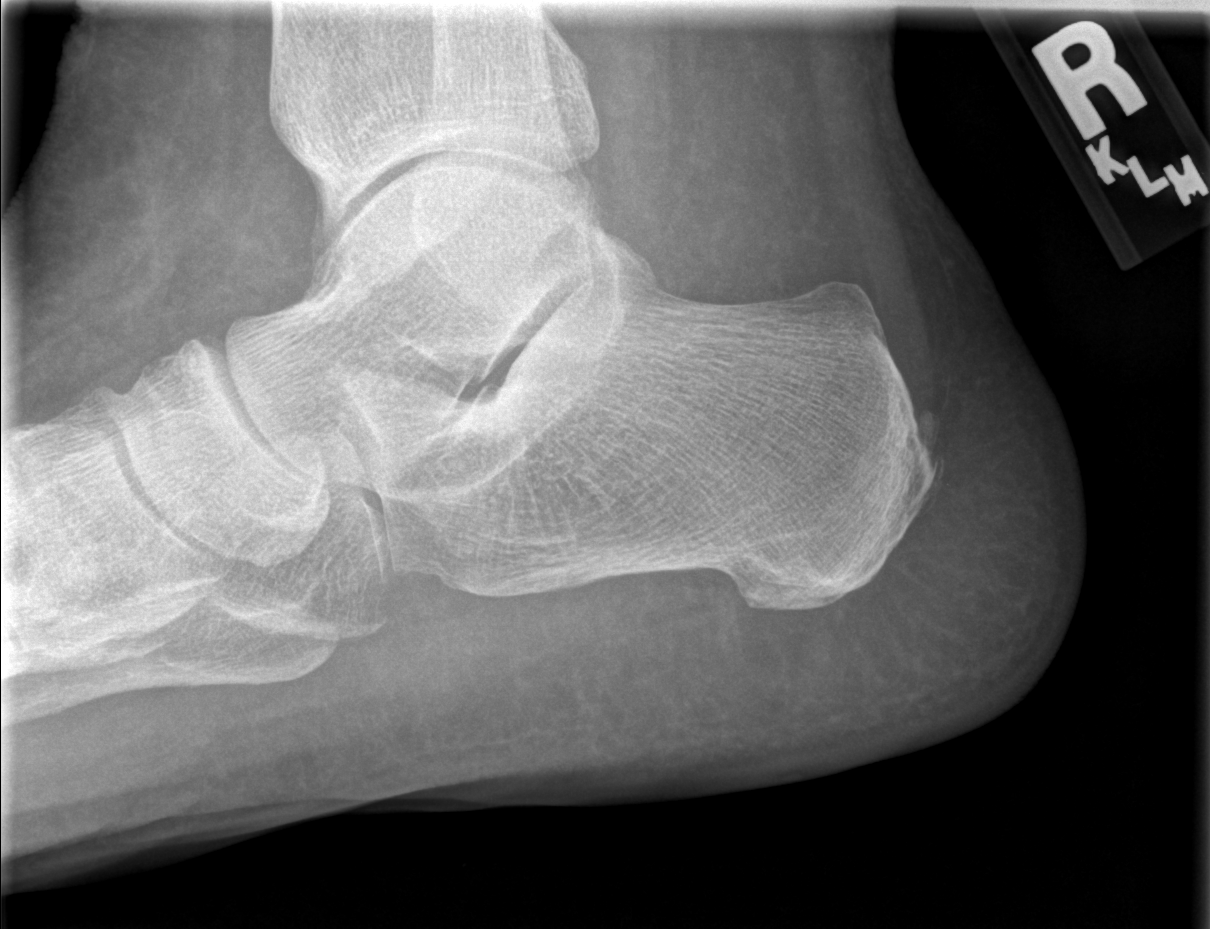

[2 of 2 positions shown; findings below may reference images not displayed]

FINDINGS: No radiopaque foreign object surrounding the right heel.
No plantar spur.  No bony destructive lesion.
IMPRESSION: No radiopaque foreign object surrounding the right heel.

## 2008-05-19 IMAGING — CR DG CHEST 2V
2 series · 2 of 2 positions shown · non-contrast
Comparison: Chest [DATE].

CLINICAL DATA: Preoperative film in patient for excision of a
foreign body from the right foot

CHEST - 2 VIEW

[view not recorded (1 of 2)]
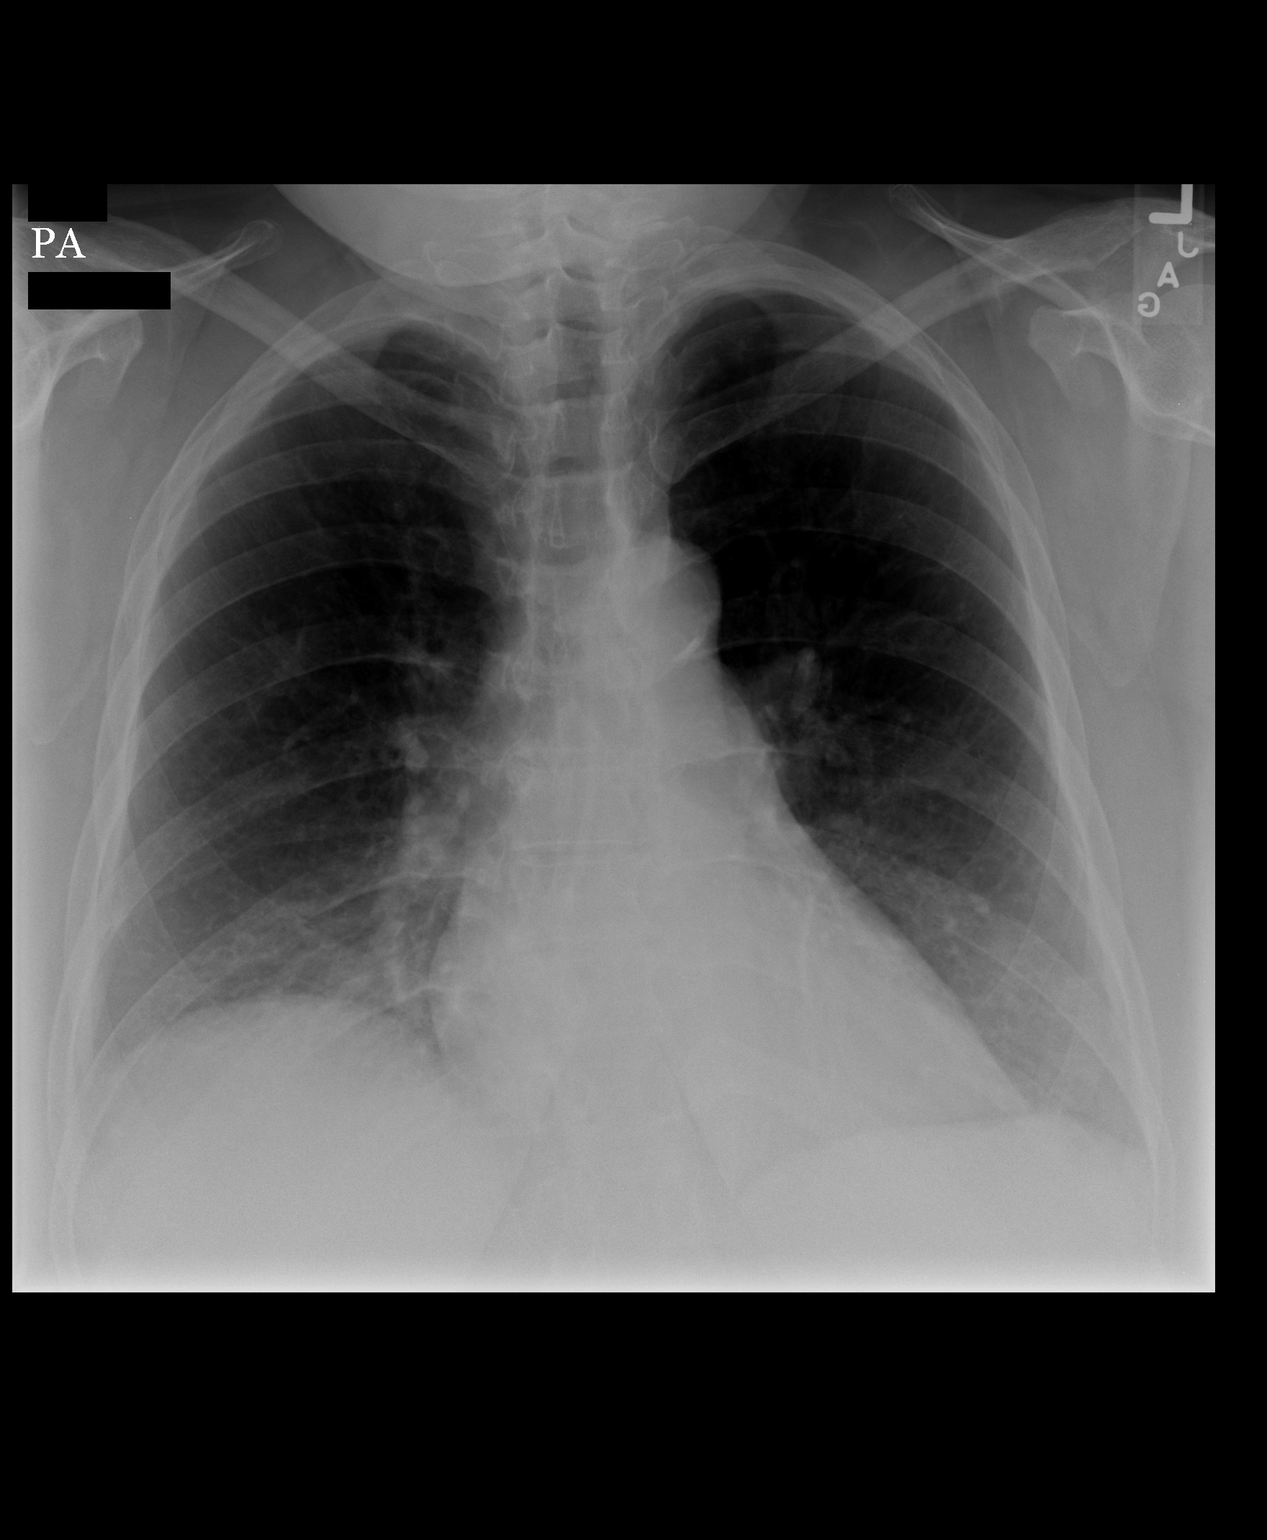

[view not recorded (2 of 2)]
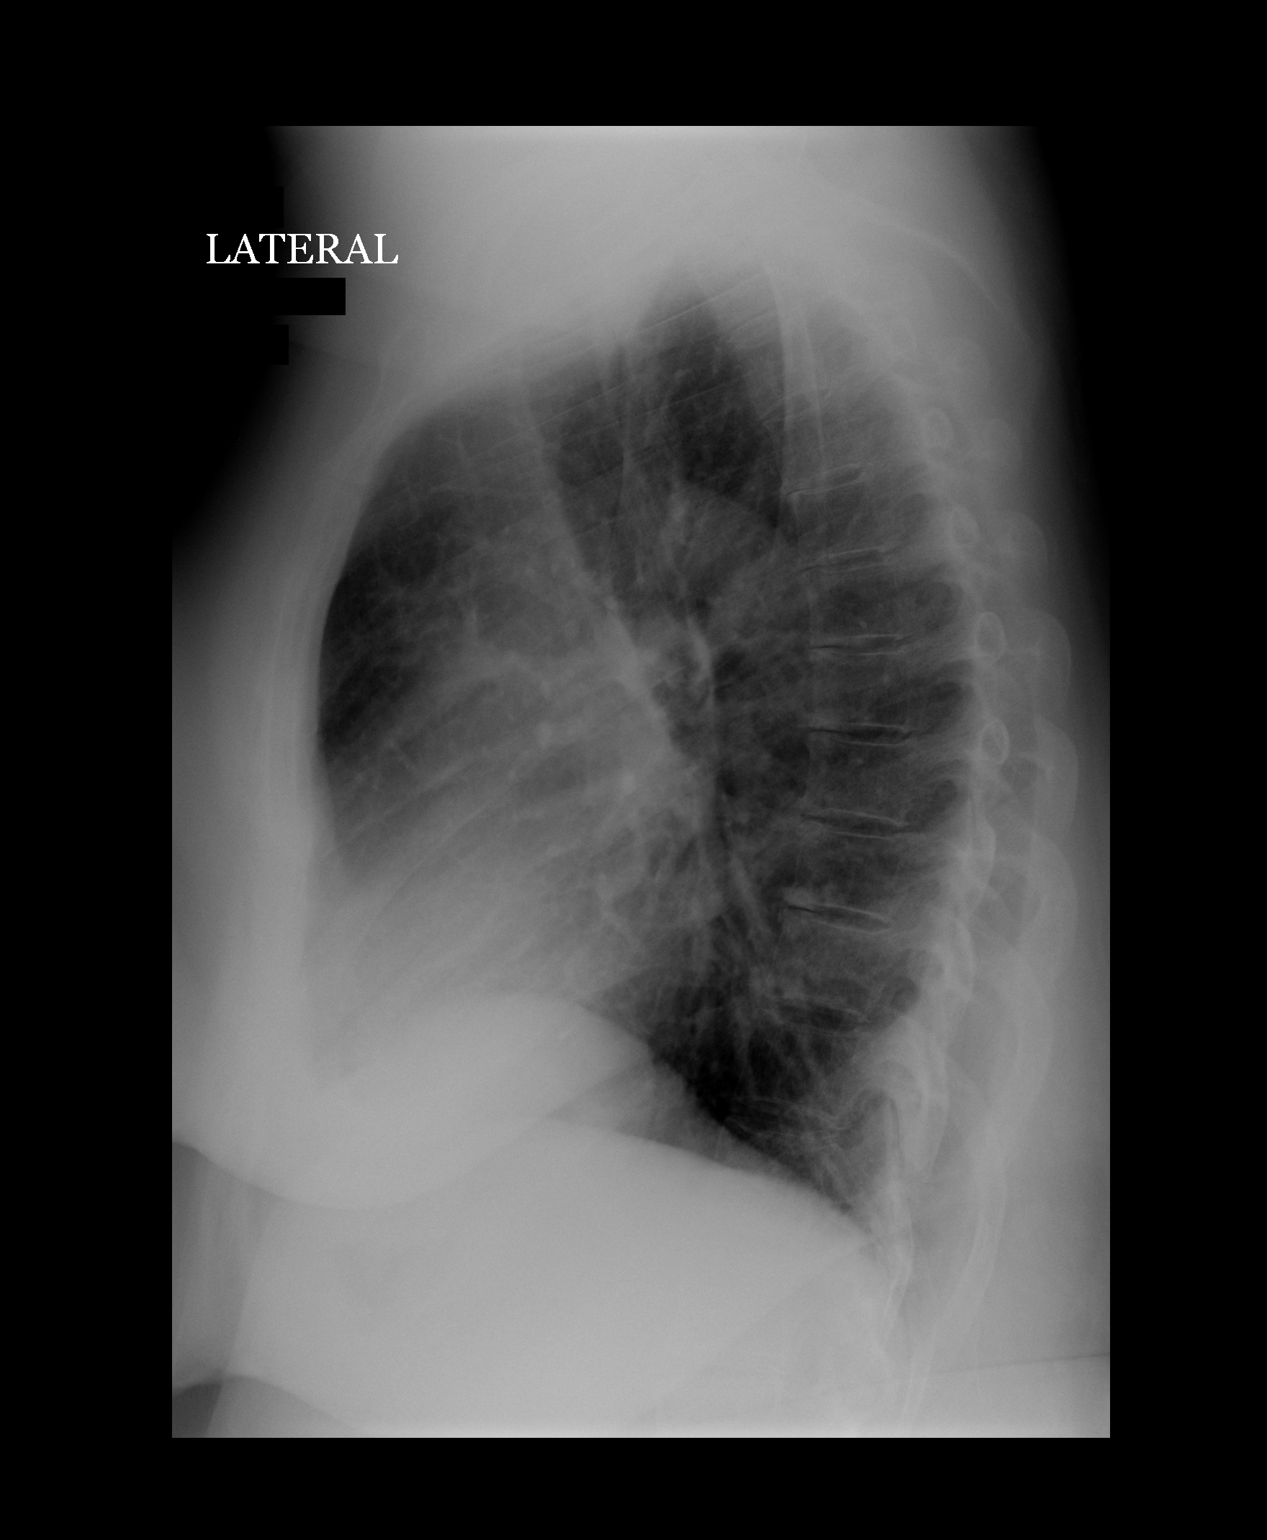

[2 of 2 positions shown; findings below may reference images not displayed]

FINDINGS: The chest is hyperexpanded consistent with
COPD/emphysema.  No focal airspace disease or effusion.  Heart size
upper normal.
IMPRESSION: No acute finding.  Changes of COPD noted.

## 2008-05-24 ENCOUNTER — Encounter (INDEPENDENT_AMBULATORY_CARE_PROVIDER_SITE_OTHER): Payer: Self-pay | Admitting: Orthopedic Surgery

## 2008-05-24 ENCOUNTER — Ambulatory Visit (HOSPITAL_COMMUNITY): Admission: RE | Admit: 2008-05-24 | Discharge: 2008-05-24 | Payer: Self-pay | Admitting: Orthopedic Surgery

## 2008-07-03 IMAGING — CR DG CERVICAL SPINE COMPLETE 4+V
6 series · 6 of 6 positions shown · non-contrast
Comparison: None.

CLINICAL DATA: Neck pain and bilateral shoulder pain.

CERVICAL SPINE - COMPLETE 4+ VIEW

[w c-spine lat]
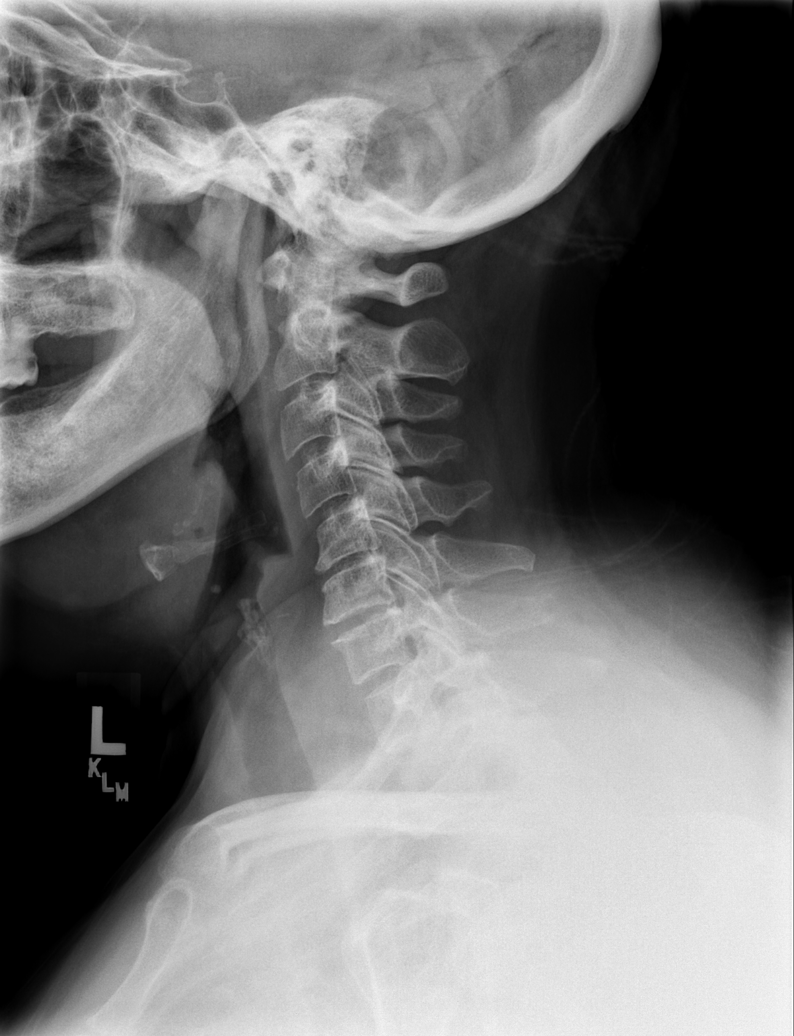

[w c-spine oblique (1 of 2)]
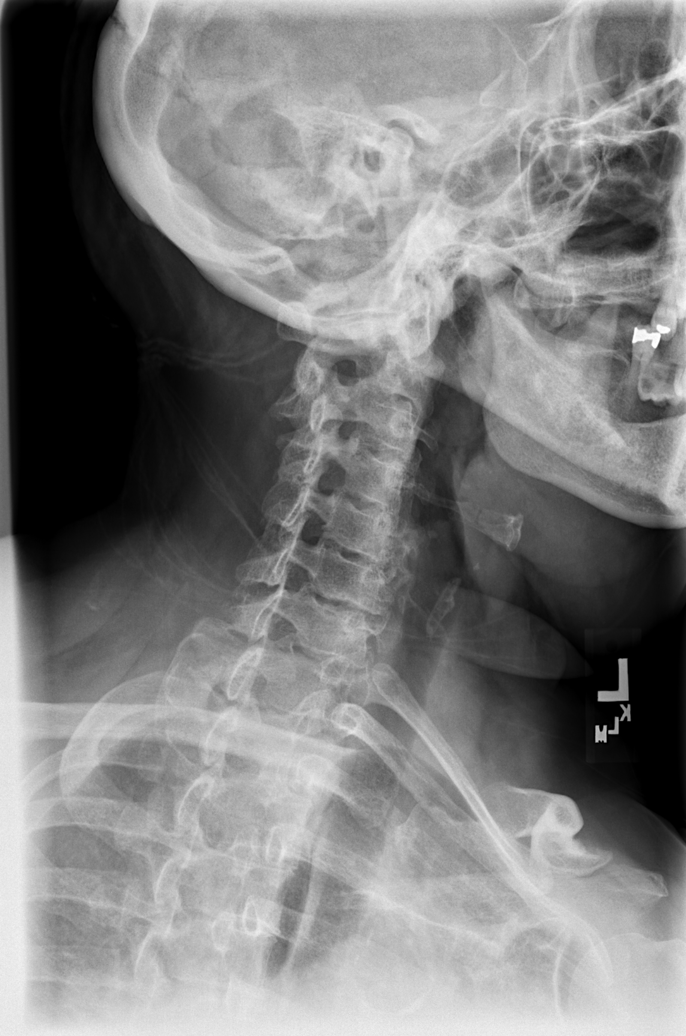

[w c-spine oblique (2 of 2)]
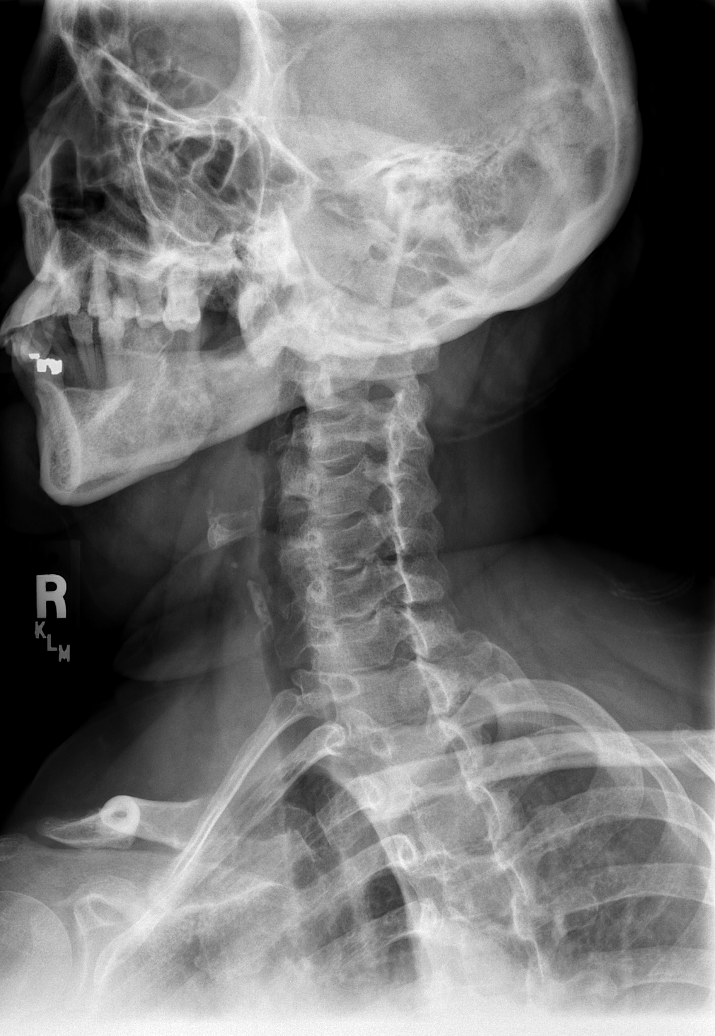

[w c-spine a.p. *]
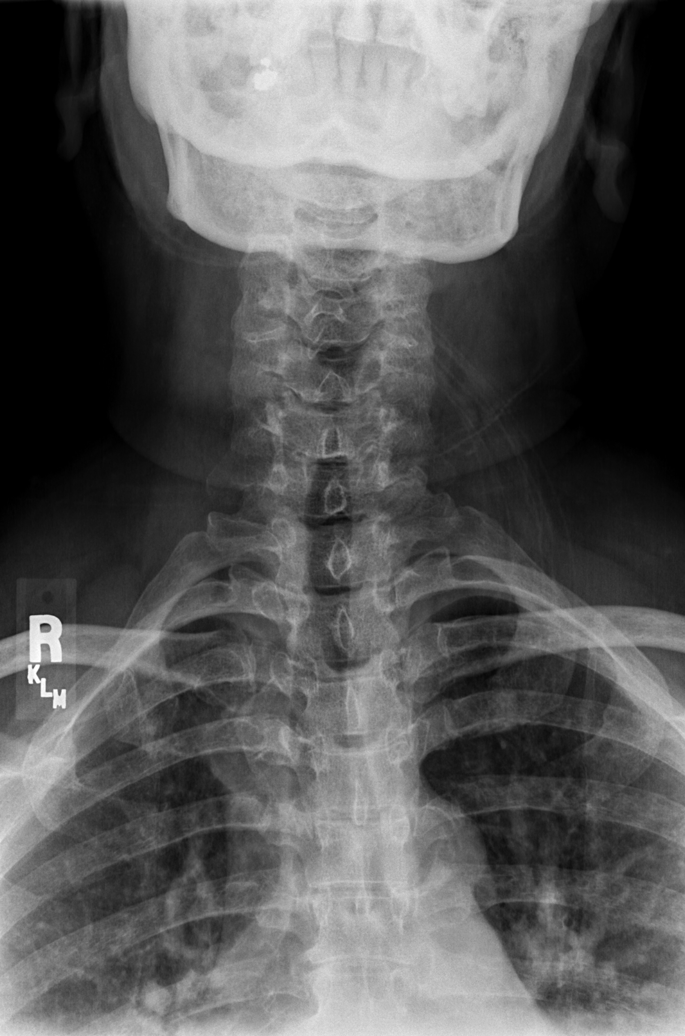

[w c-spine odontoid * (1 of 2)]
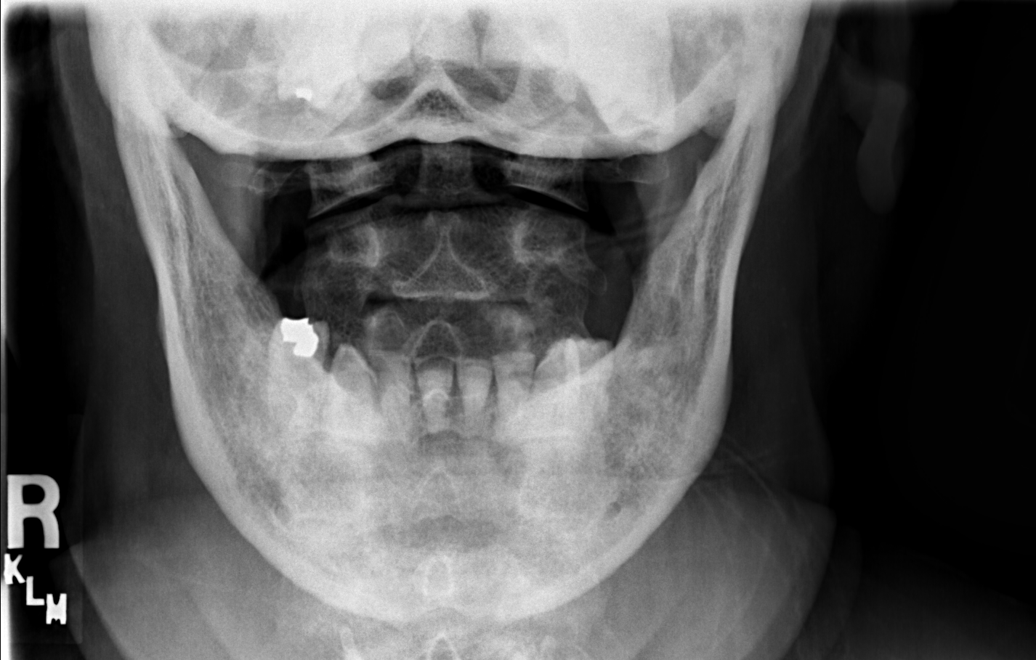

[w c-spine odontoid * (2 of 2)]
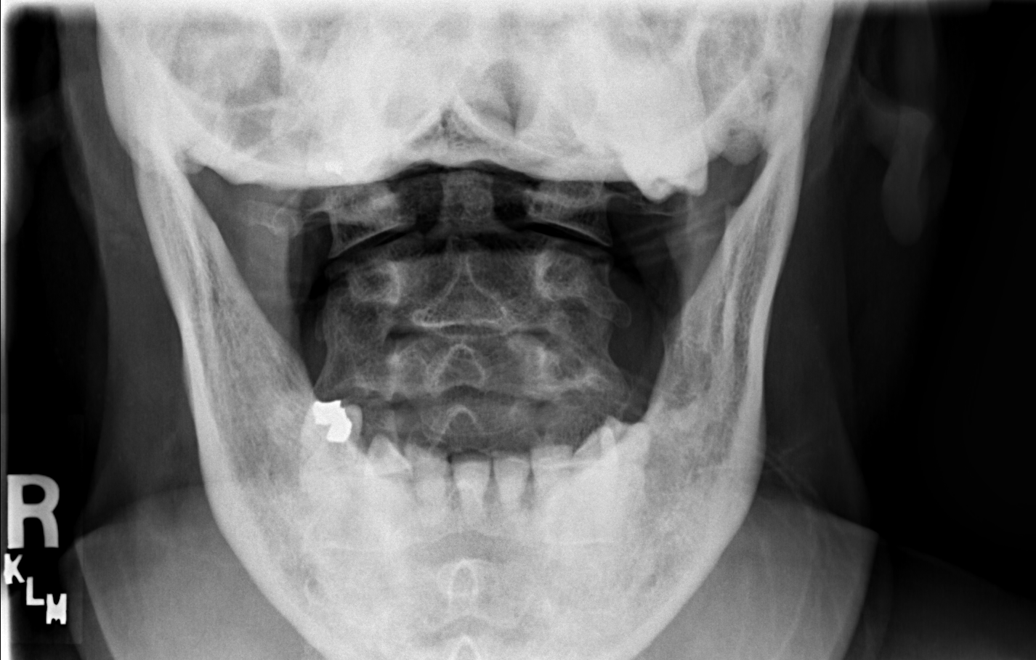

[6 of 6 positions shown; findings below may reference images not displayed]

FINDINGS: Mild to moderate disc degeneration and spondylosis at C5-
6 and C6-7.  This is causing mild foraminal narrowing bilaterally.
There is mild disc degeneration at C4-5.  Facet joints appear
intact.  There is no fracture or bony lesion.
IMPRESSION: Mild to moderate disc degeneration and spondylosis.  No acute bony
abnormality.

## 2008-08-01 ENCOUNTER — Encounter: Admission: RE | Admit: 2008-08-01 | Discharge: 2008-08-01 | Payer: Self-pay | Admitting: Orthopedic Surgery

## 2008-08-01 IMAGING — US US EXTREM LOW VENOUS BILAT
1 series · 14 of 24 positions shown · non-contrast
Comparison: None.

CLINICAL DATA: Bilateral leg swelling.

VENOUS DUPLEX ULTRASOUND OF BILATERAL LOWER EXTREMITIES
TECHNIQUE: Gray-scale sonography with graded compression, as well
as color Doppler and duplex ultrasound, were performed to evaluate
the deep venous system of both lower extremities from the level of
the common femoral vein through the popliteal and proximal calf
veins. Spectral Doppler was utilized to evaluate flow at rest and
with distal augmentation maneuvers.

[Series 1: us extrem low venous bilat · 14 of 60 slices shown]
[im 1/60]
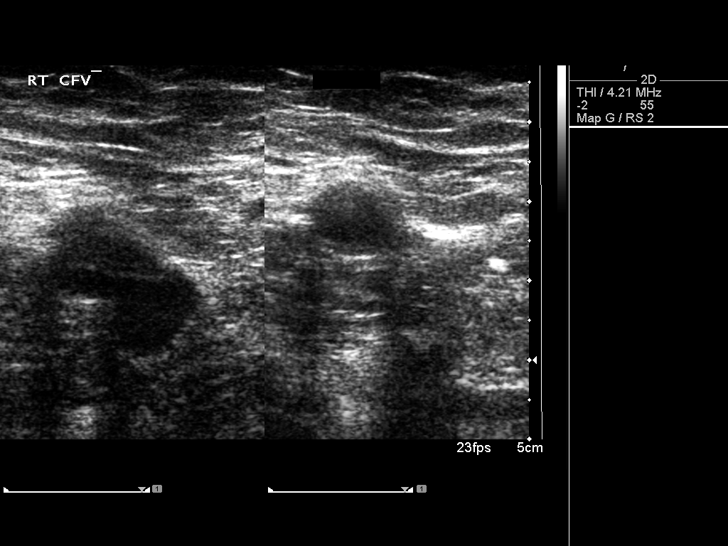
[im 6/60]
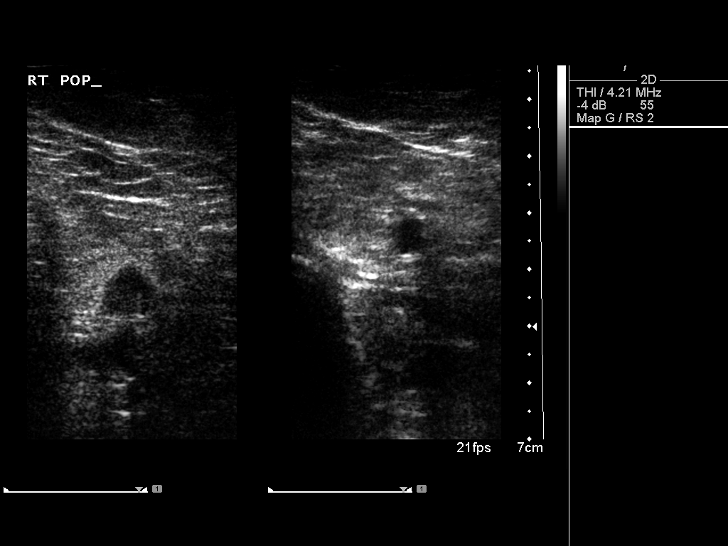
[im 11/60]
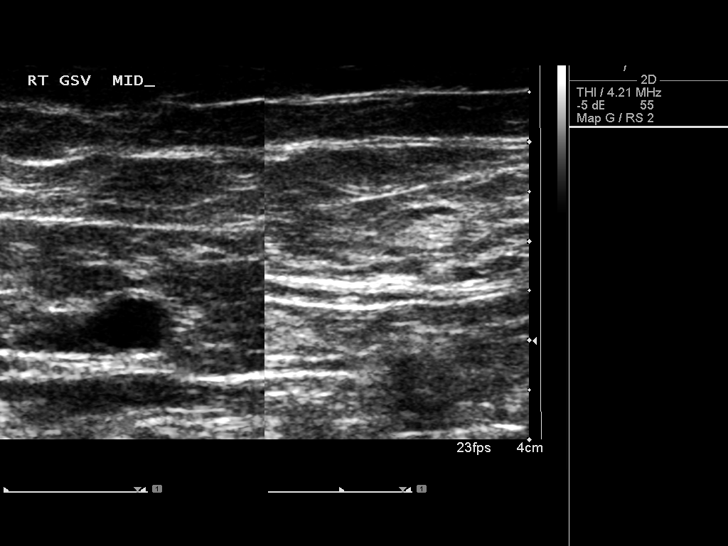
[im 16/60]
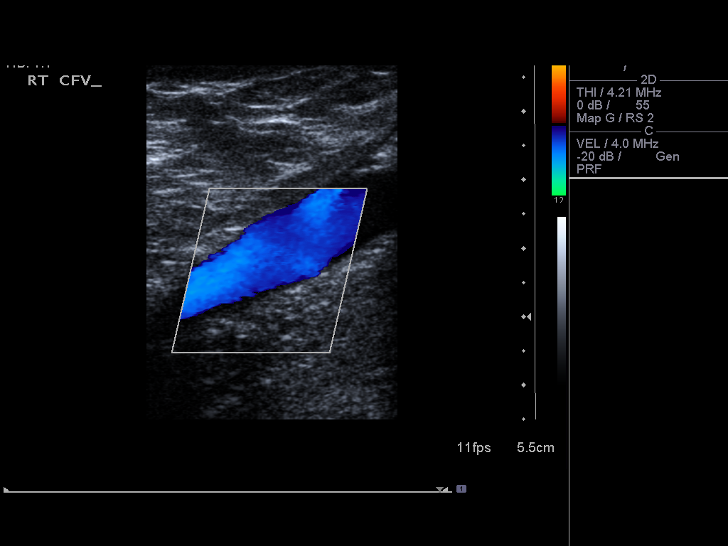
[im 18/60]
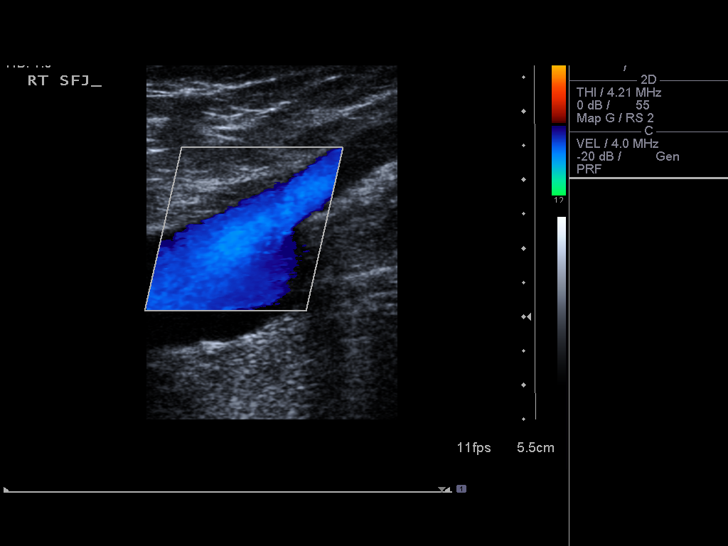
[im 24/60]
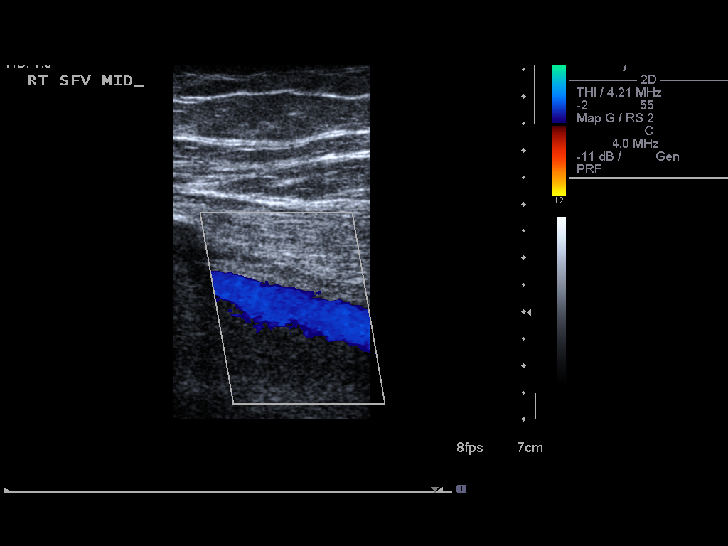
[im 29/60]
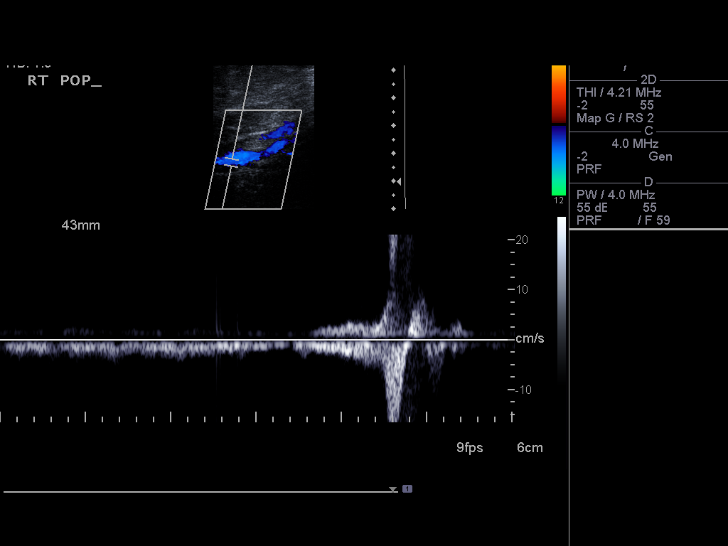
[im 31/60]
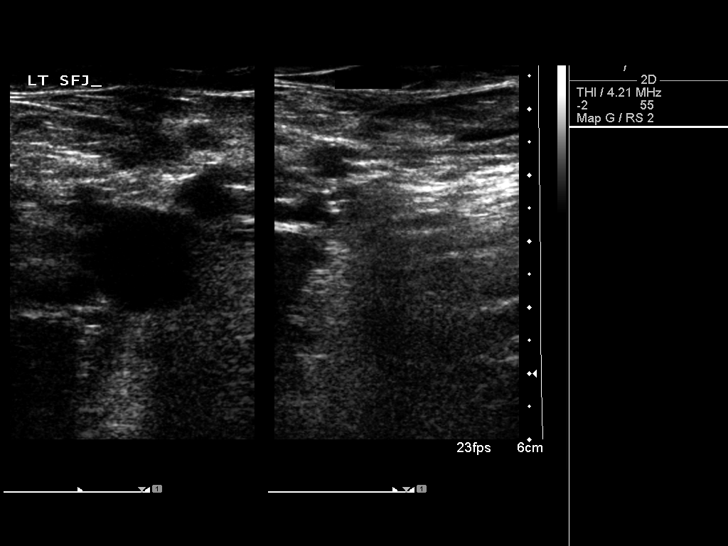
[im 36/60]
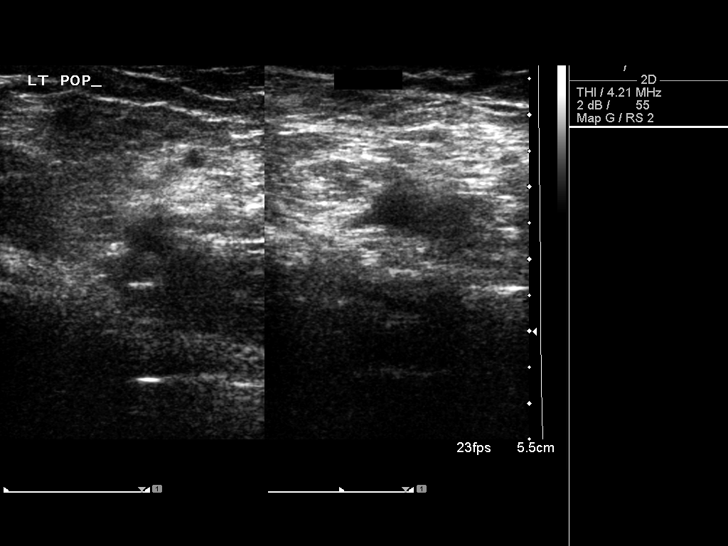
[im 42/60]
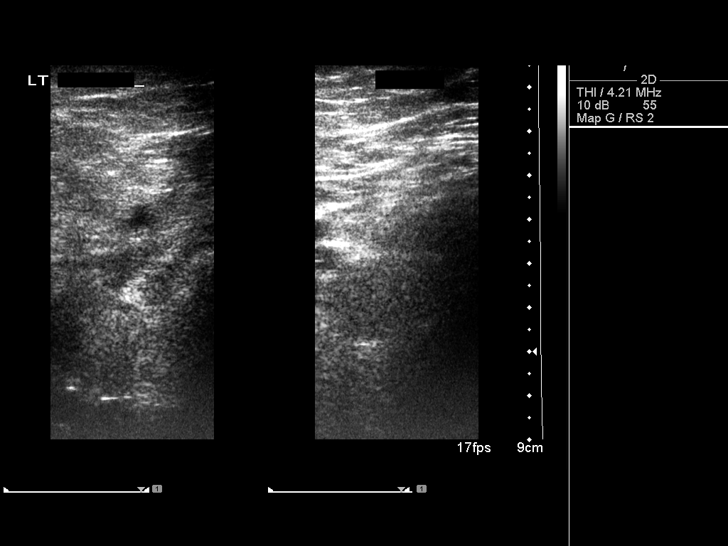
[im 47/60]
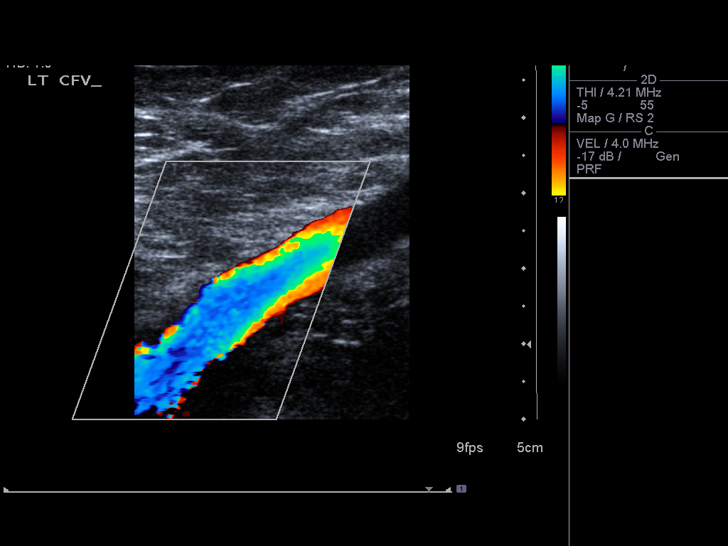
[im 49/60]
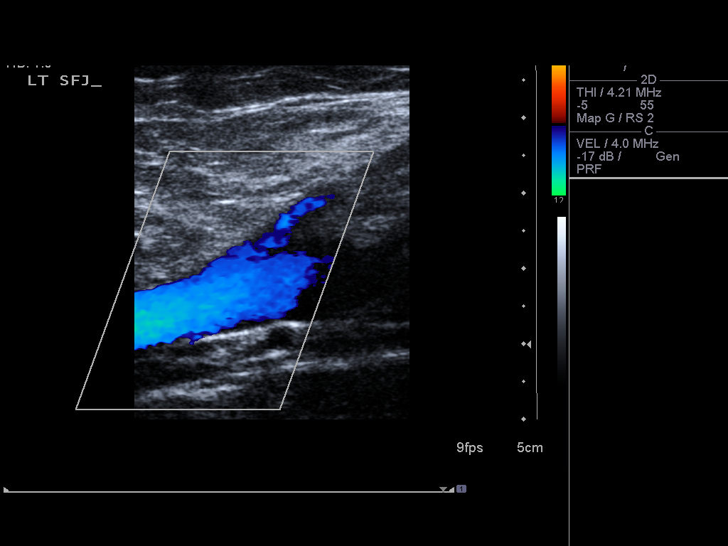
[im 54/60]
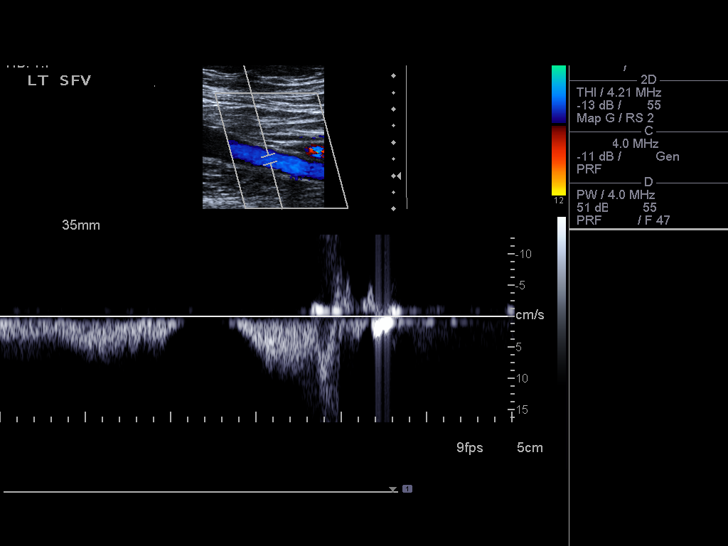
[im 60/60]
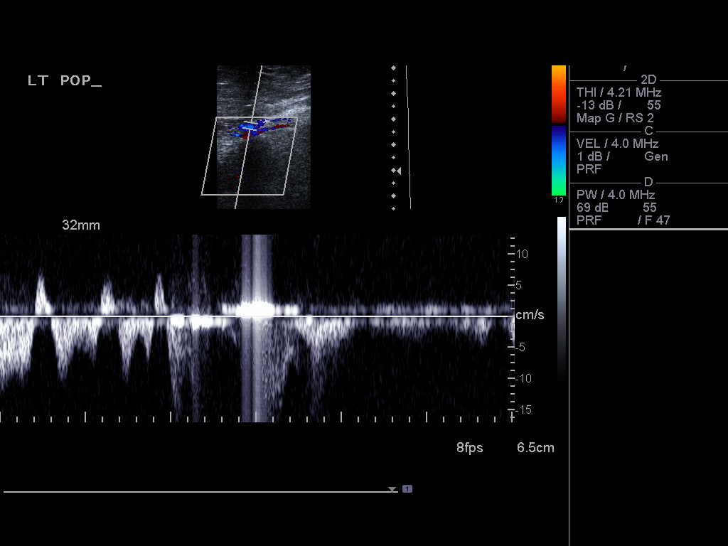

[14 of 24 positions shown; findings below may reference images not displayed]

FINDINGS: From the level of the common femoral vein to the
popliteal vein, there is adequate color flow, augmentation and
compression.  There is adequate compression and color flow of the
proximal calf veins.  Adequate compression of the greater saphenous
vein.
IMPRESSION: No evidence of deep venous thrombosis involving either lower
extremity.

## 2009-02-02 ENCOUNTER — Encounter: Admission: RE | Admit: 2009-02-02 | Discharge: 2009-02-02 | Payer: Self-pay | Admitting: Orthopedic Surgery

## 2009-02-02 IMAGING — CR DG SHOULDER 2+V*L*
3 series · 3 of 3 positions shown · non-contrast
Comparison: [HOSPITAL] chest x-ray [DATE].

CLINICAL DATA: (Right greater than left) shoulder pain without
specific injury.

LEFT SHOULDER - 2+ VIEW

[w shoulder ap internal left]
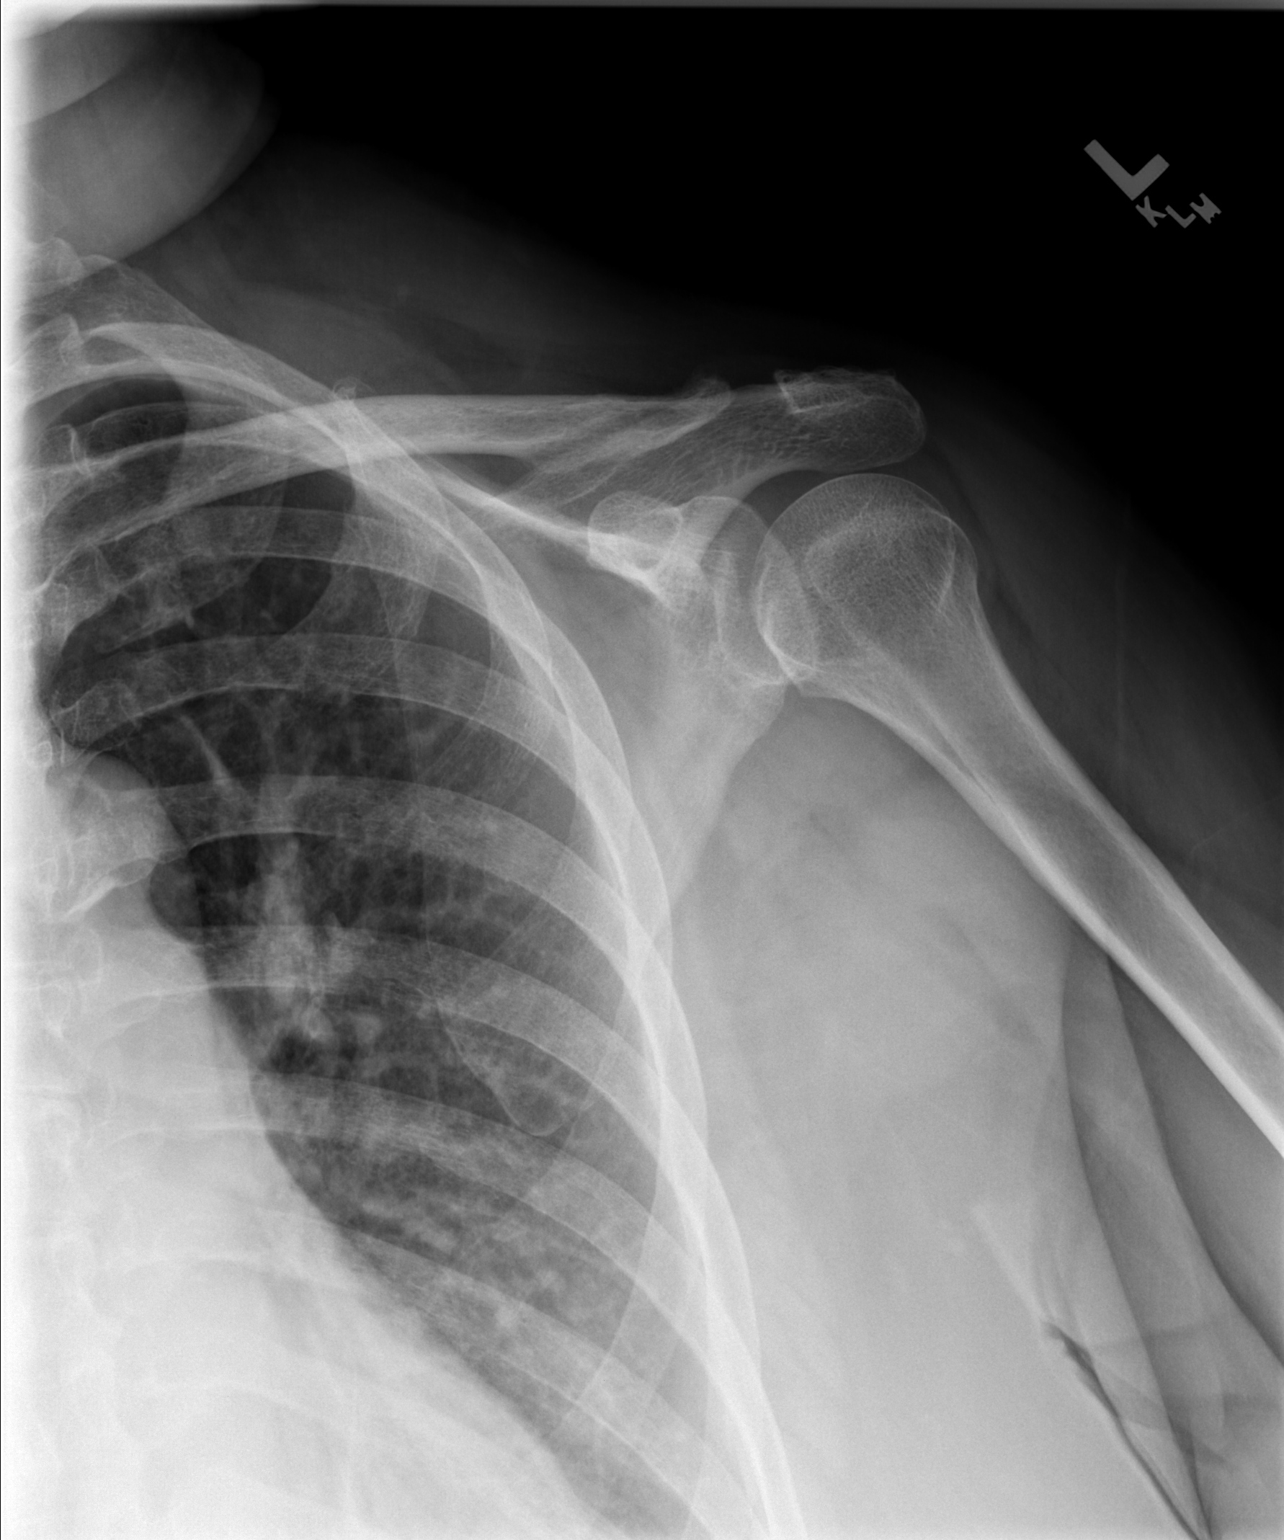

[w shoulder ap external left]
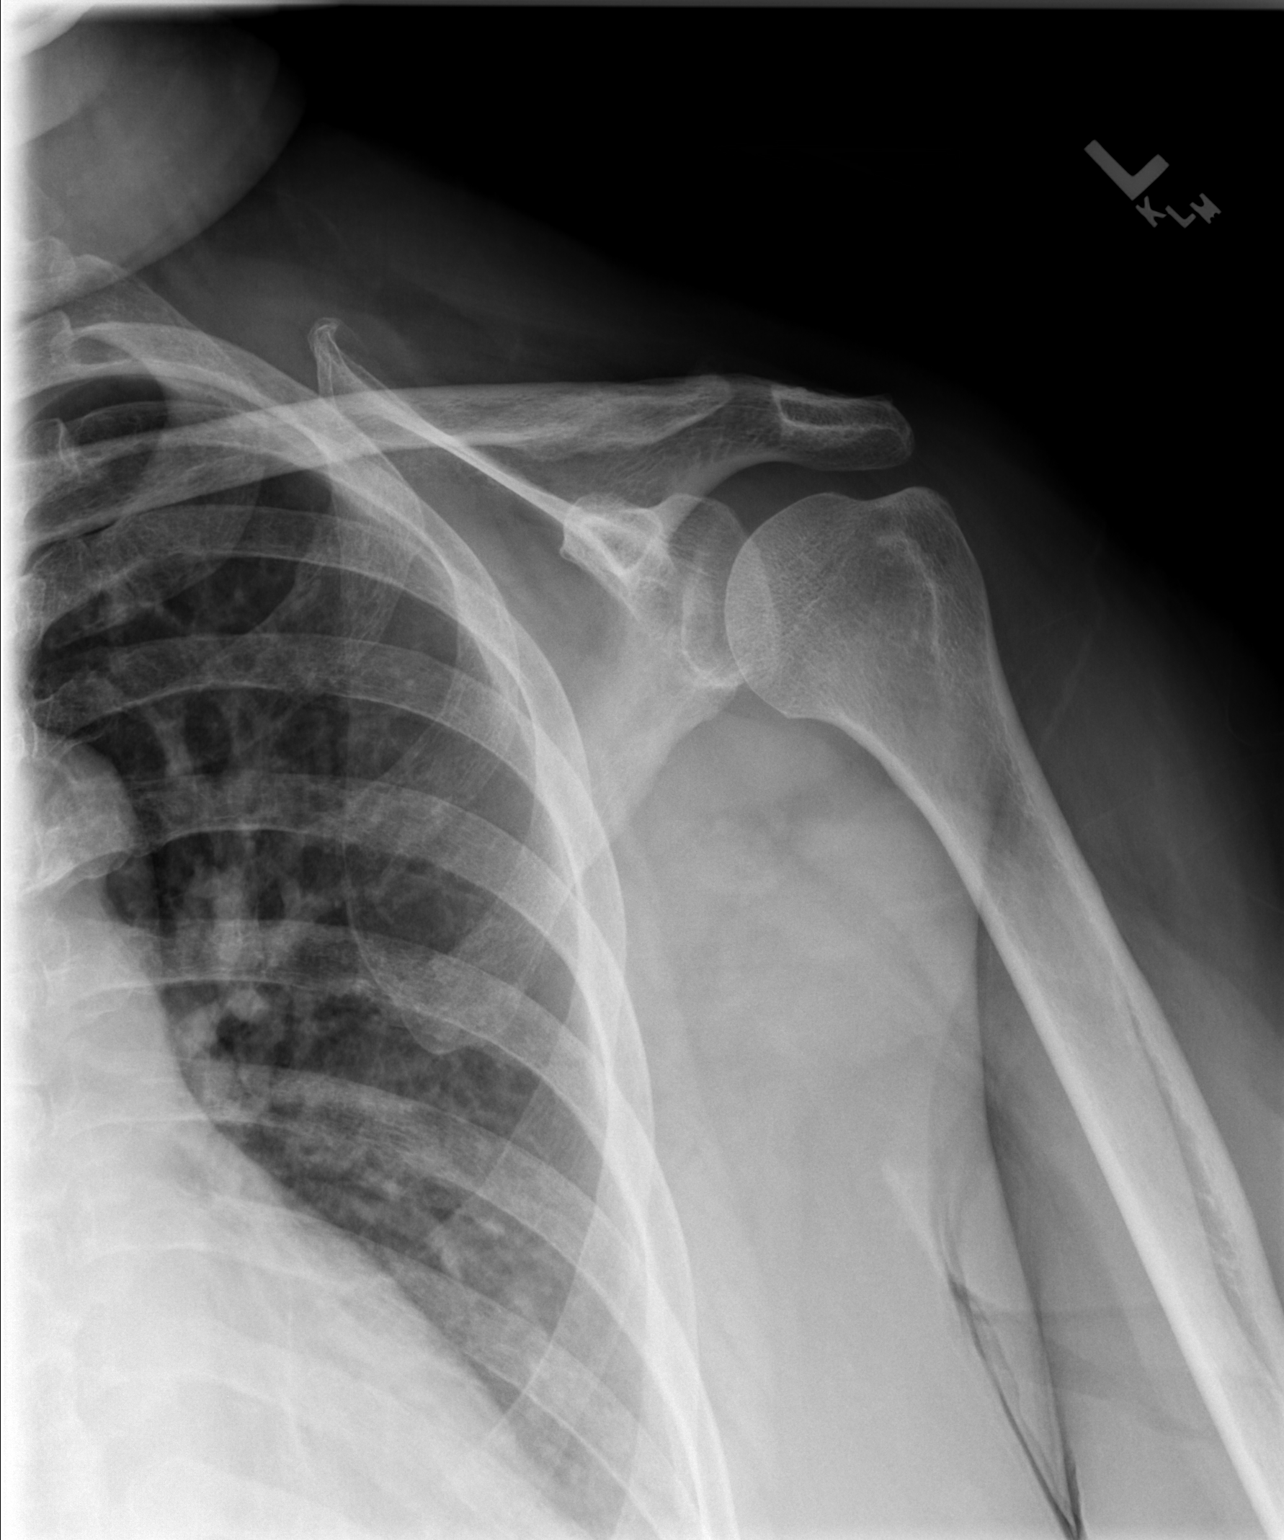

[w shoulder y view left]
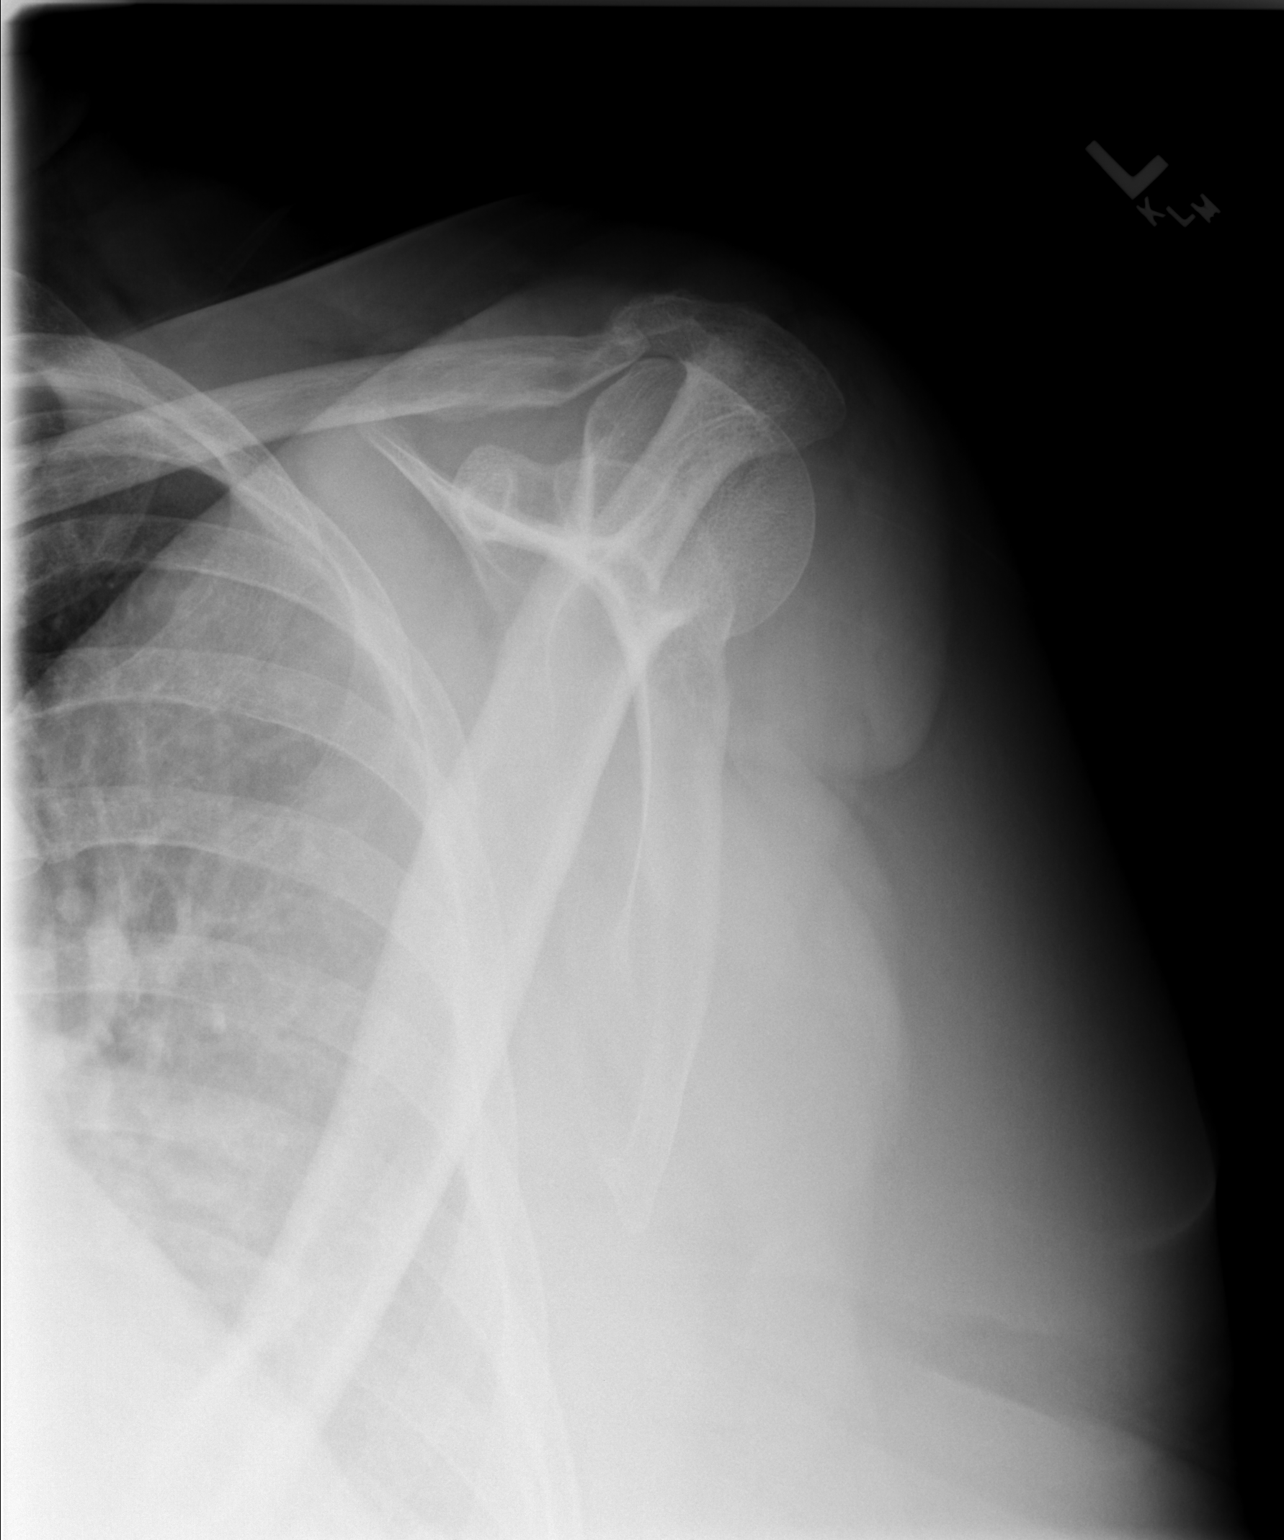

[3 of 3 positions shown; findings below may reference images not displayed]

FINDINGS: Symmetrical widening bilateral acromioclavicular joints
probably represents anatomic variation.  No other significant
osseous, articular, or soft tissue abnormalities seen.
IMPRESSION: 1.  Probable anatomic variation symmetrical widened
acromioclavicular joints.
2.  Otherwise, negative.

## 2009-02-02 IMAGING — CR DG SHOULDER 2+V*R*
3 series · 3 of 3 positions shown · non-contrast
Comparison: [HOSPITAL] chest x-ray [DATE].

CLINICAL DATA: (Right greater than left) bilateral shoulder pain
without specific injury.

RIGHT SHOULDER - 2+ VIEW

[w shoulder ap internal righ]
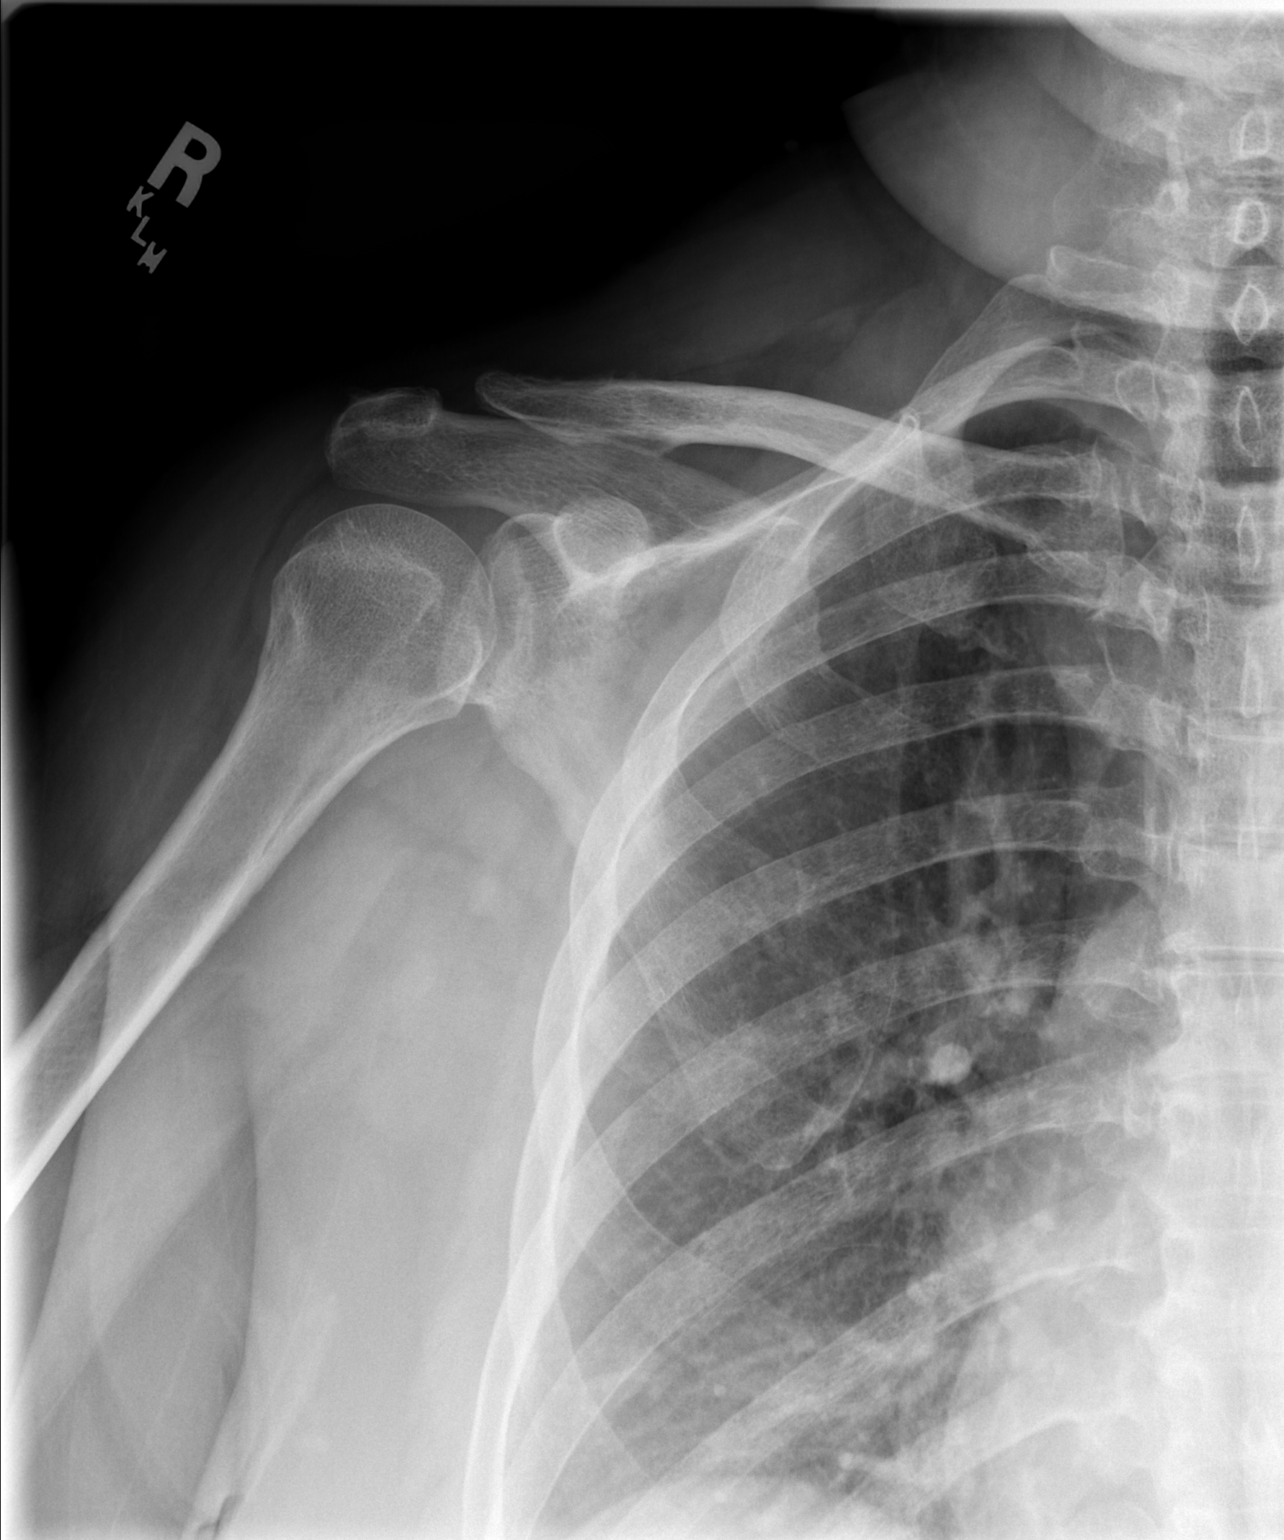

[w shoulder ap external righ]
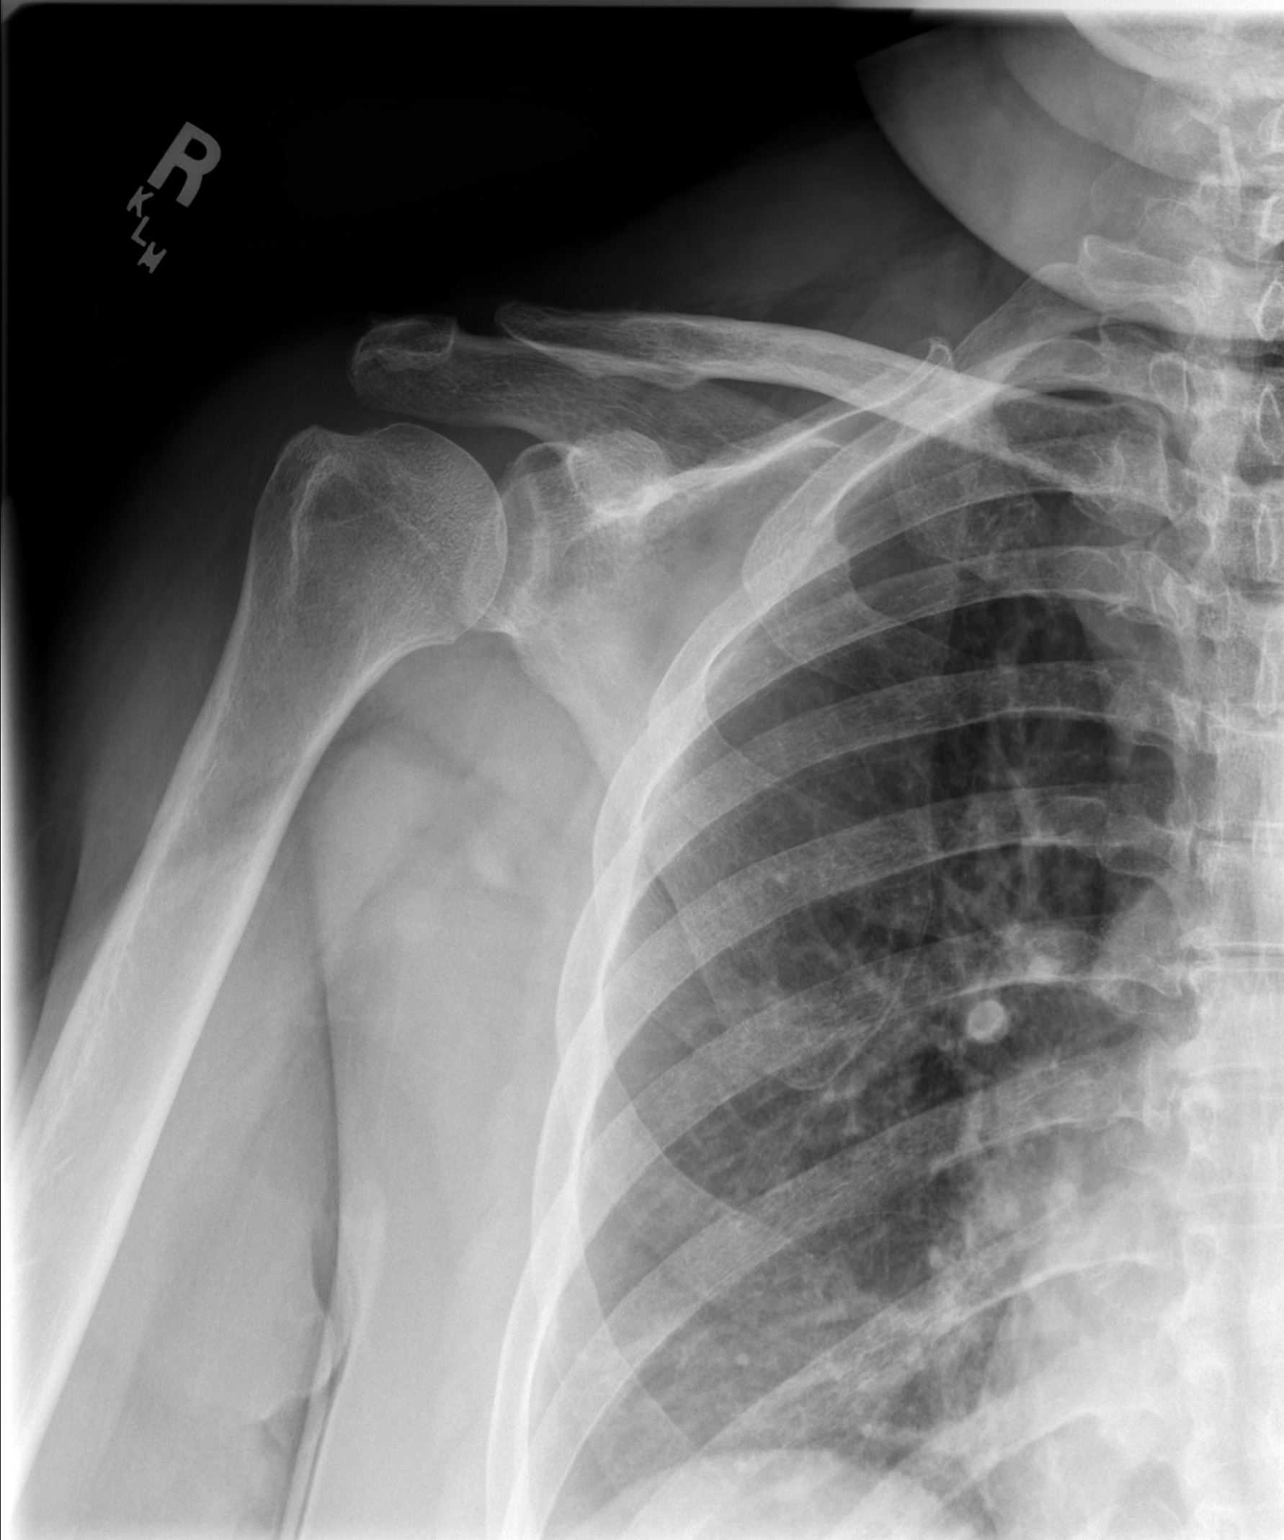

[w shoulder y view right]
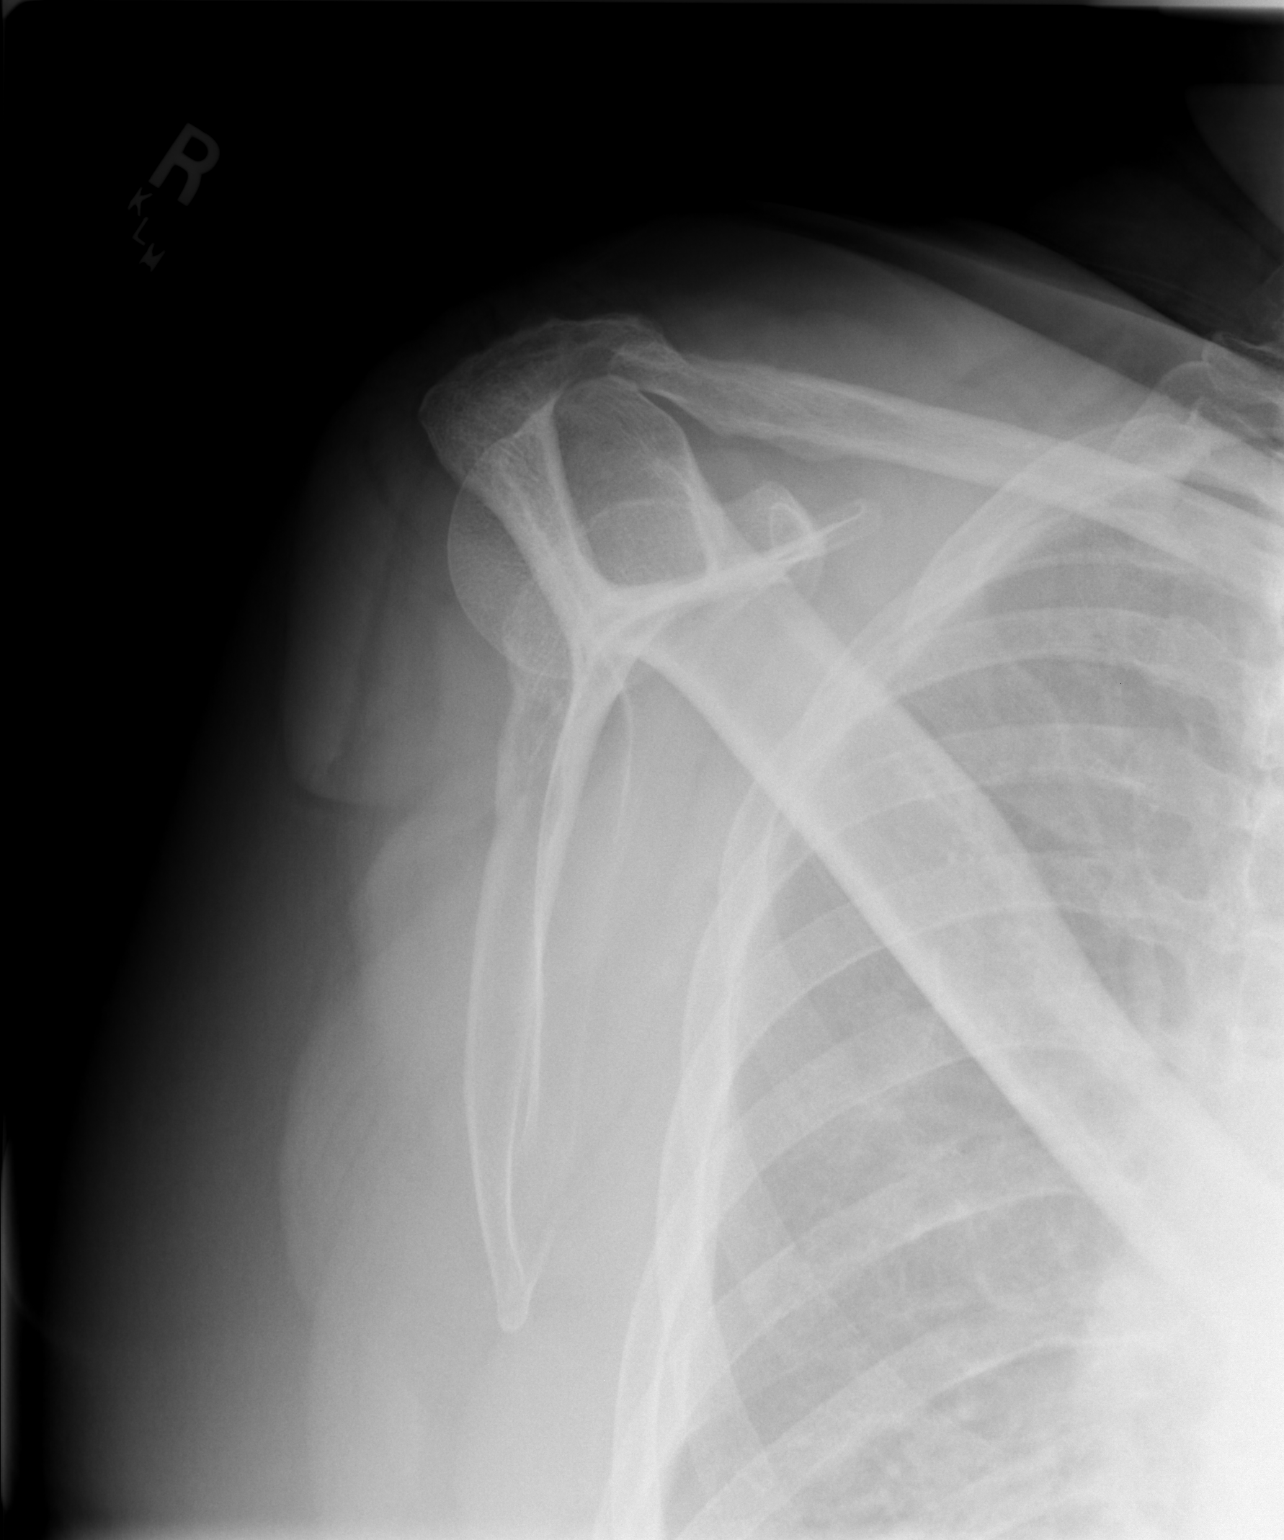

[3 of 3 positions shown; findings below may reference images not displayed]

FINDINGS: Symmetrical probable anatomic variant slight widening at
the acromioclavicular joints seen.  No other significant osseous,
articular or soft tissue abnormalities seen.
IMPRESSION: 1.  Probable anatomic variant symmetrical widening at the acromial
clavicular joints.
2.  Otherwise, negative.

## 2009-04-23 ENCOUNTER — Encounter: Admission: RE | Admit: 2009-04-23 | Discharge: 2009-04-23 | Payer: Self-pay | Admitting: Orthopedic Surgery

## 2009-07-05 ENCOUNTER — Inpatient Hospital Stay (HOSPITAL_COMMUNITY): Admission: EM | Admit: 2009-07-05 | Discharge: 2009-07-10 | Payer: Self-pay | Admitting: Internal Medicine

## 2009-07-05 IMAGING — CT CT ABD-PELV W/ CM
2 of 5 series · 17 of 46 positions shown, 19 images · IV contrast (APPLIED)
Comparison: Abdominal pelvic CT [DATE].

CLINICAL DATA: Abdominal pain with rash.  History of colostomy for
diverticulitis.

CT ABDOMEN AND PELVIS WITH CONTRAST
TECHNIQUE: Multidetector CT imaging of the abdomen and pelvis was
performed following the standard protocol during bolus
administration of intravenous contrast.
Contrast: 100 ml [8E] intravenously.

[Series 2: abd/pelv with 5.0 b31f st · axial · 0.98mm/px · z∈[-642,-232]mm · 14 of 94 slices shown, 16 images]
[im 6/94  soft-tissue]
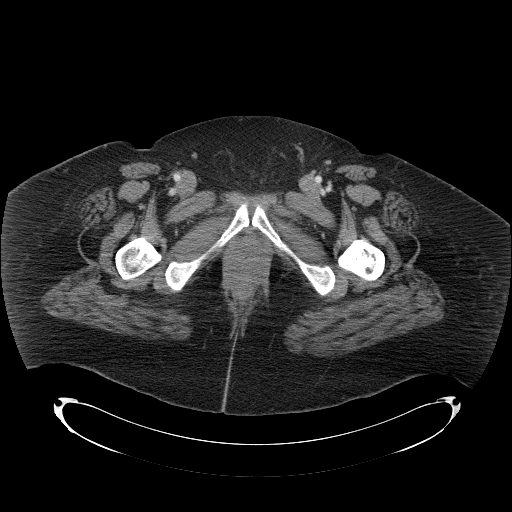
[im 6/94  bone]
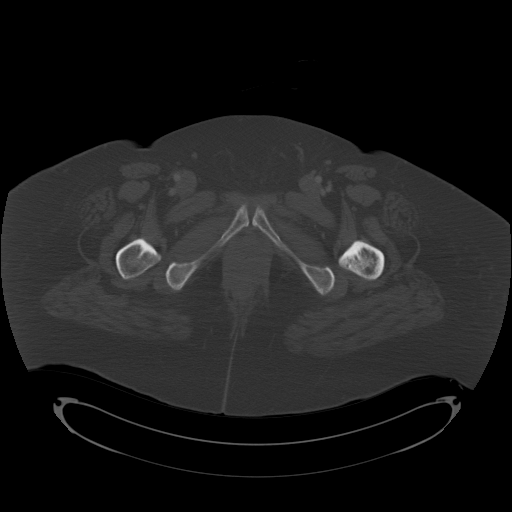
[im 12/94  soft-tissue]
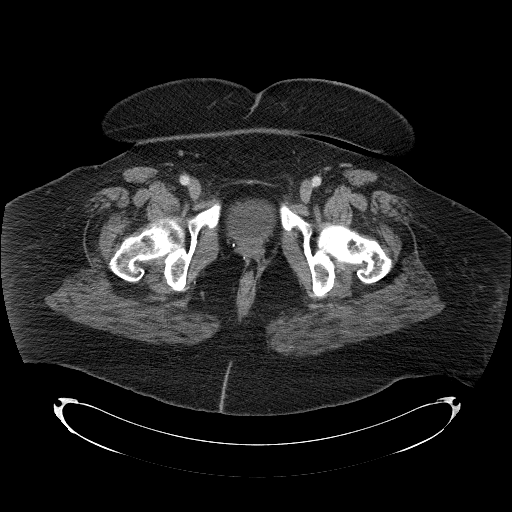
[im 18/94  soft-tissue]
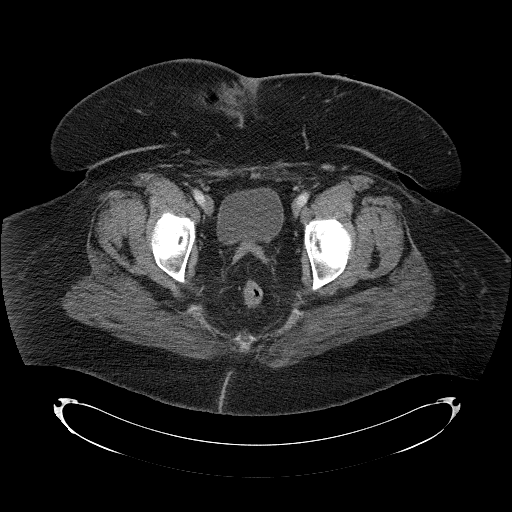
[im 24/94  soft-tissue]
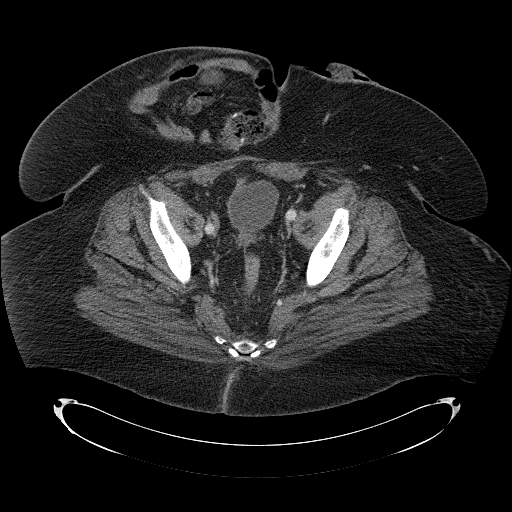
[im 30/94  soft-tissue]
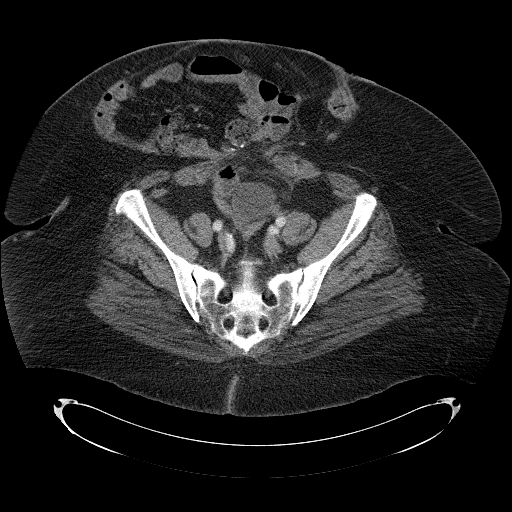
[im 35/94  soft-tissue]
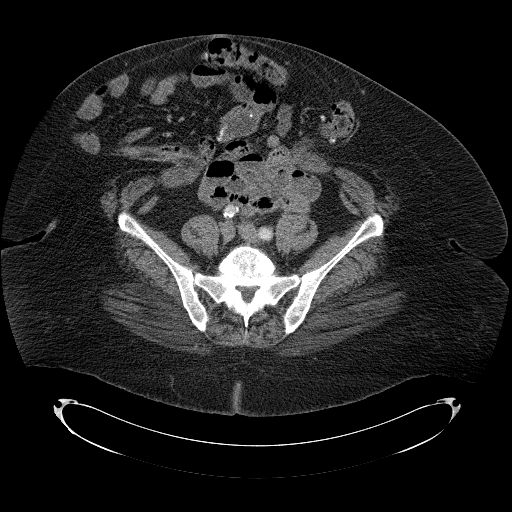
[im 41/94  soft-tissue]
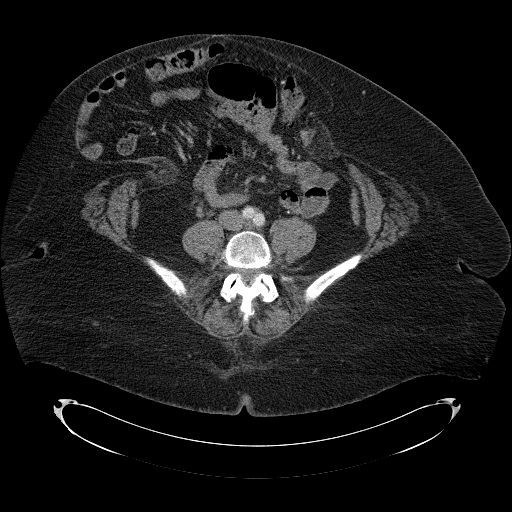
[im 53/94  soft-tissue]
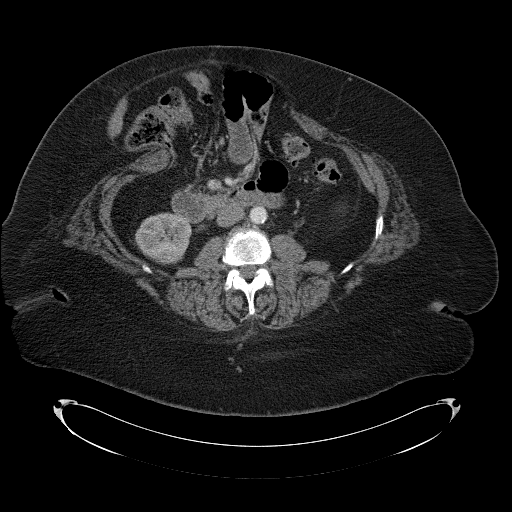
[im 59/94  soft-tissue]
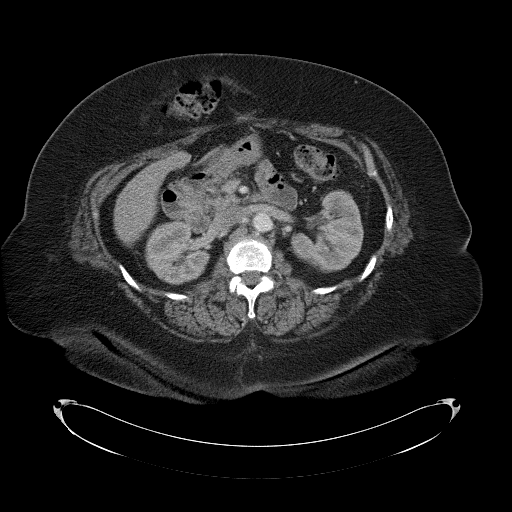
[im 59/94  bone]
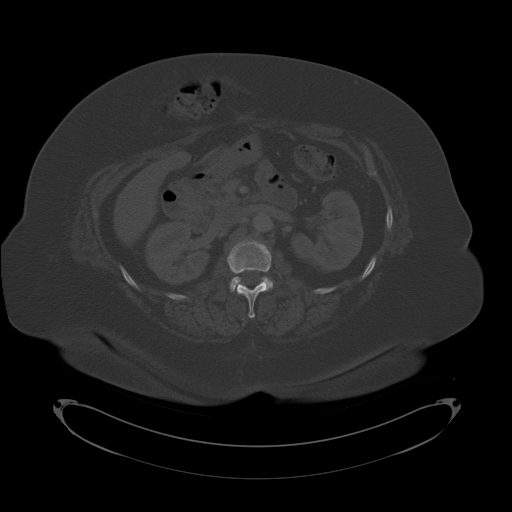
[im 64/94  soft-tissue]
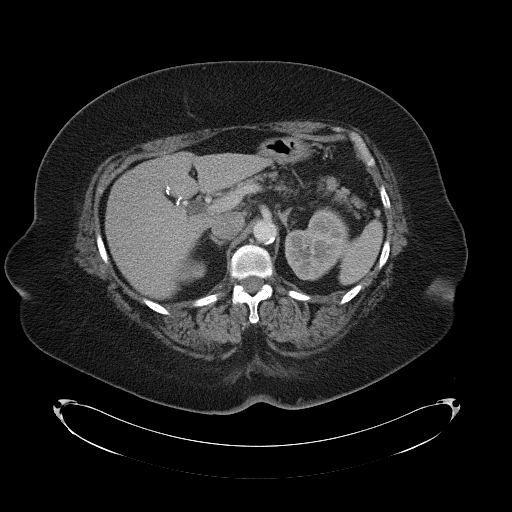
[im 70/94  soft-tissue]
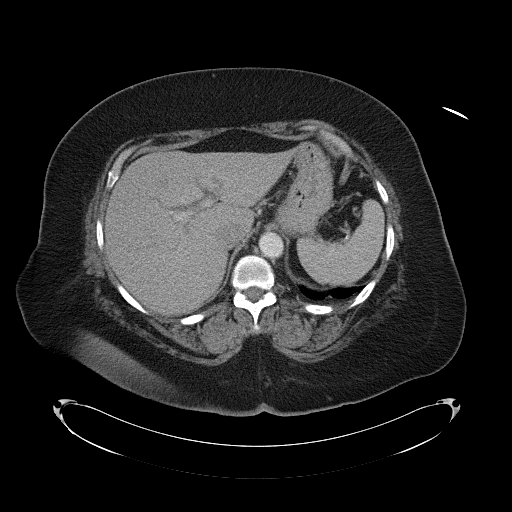
[im 76/94  soft-tissue]
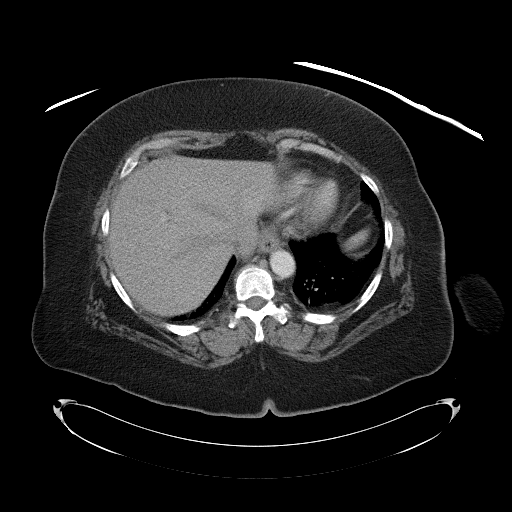
[im 82/94  soft-tissue]
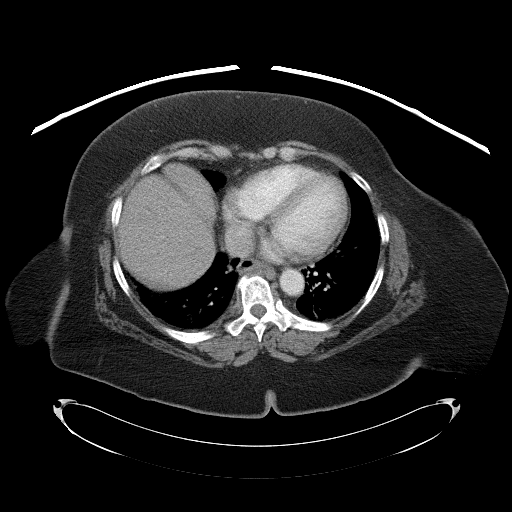
[im 88/94  soft-tissue]
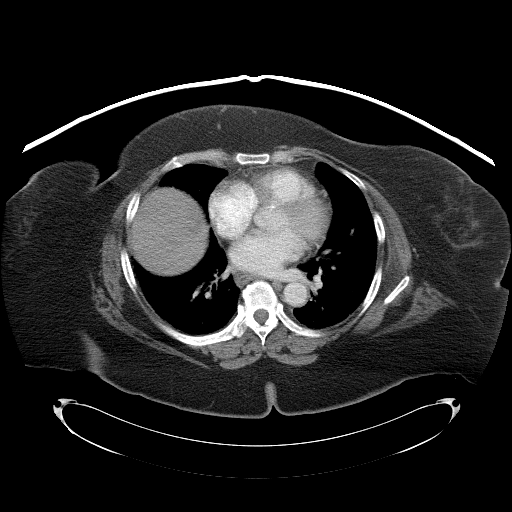

[Series 5: abd/pelv with 3.0 spo cor st · coronal · 0.91mm/px · 3 of 113 slices shown]
[im 38/113  soft-tissue]
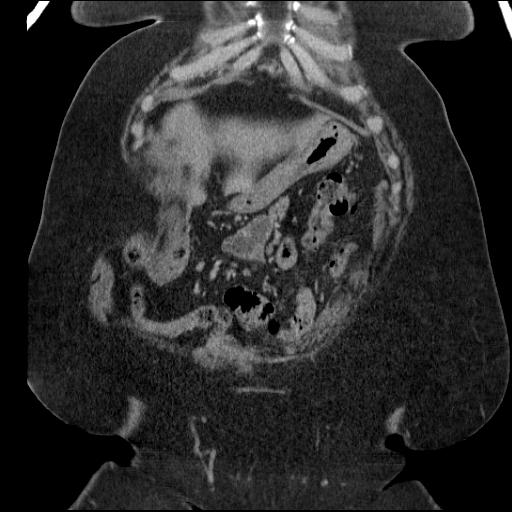
[im 50/113  soft-tissue]
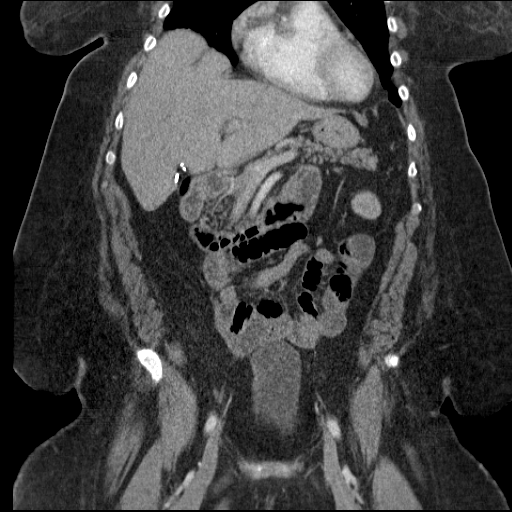
[im 63/113  soft-tissue]
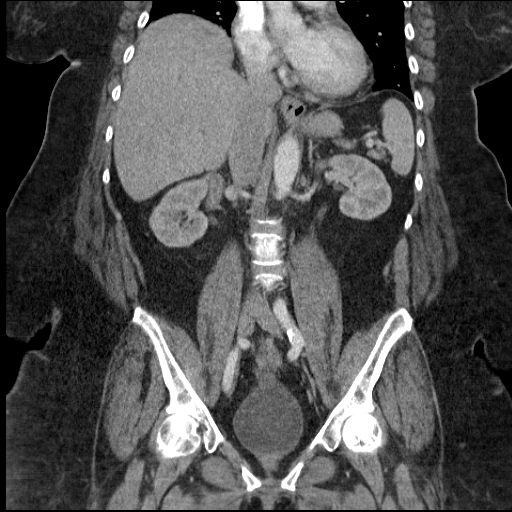

[17 of 46 positions shown; findings below may reference images not displayed]

FINDINGS: Patchy opacities at the lung bases appear chronic and
unchanged.  There is no pleural effusion.  There is a small hiatal
hernia.

There is stable mild biliary dilatation status post
cholecystectomy.  The liver, spleen, pancreas, adrenal glands and
kidneys are unchanged.  There is mild chronic left-sided
pelvocaliectasis without evidence of ureteral obstruction.

Again demonstrated is a large parastomal hernia to the right of the
distal transverse colostomy.  This hernia measures up to 27 cm
transverse and is contained by fascia.  There is no evidence of
incarceration or obstruction.  Small and large bowel protrude into
the hernia sac.  There is no ascites or inflammatory change.  The
uterus is surgically absent.  Urinary bladder appears unremarkable.

There are stable vascular calcifications and mild lumbar
spondylosis.
IMPRESSION: 1.  Stable large ventral hernia adjacent to the transverse
colostomy.
2.  No evidence of incarceration or bowel obstruction.
3.  No demonstrated acute findings.

## 2009-07-06 IMAGING — CR DG CHEST 2V
2 series · 2 of 2 positions shown · non-contrast
Comparison: [DATE]

CLINICAL DATA: Shortness of breath.

CHEST - 2 VIEW

[w chest pa]
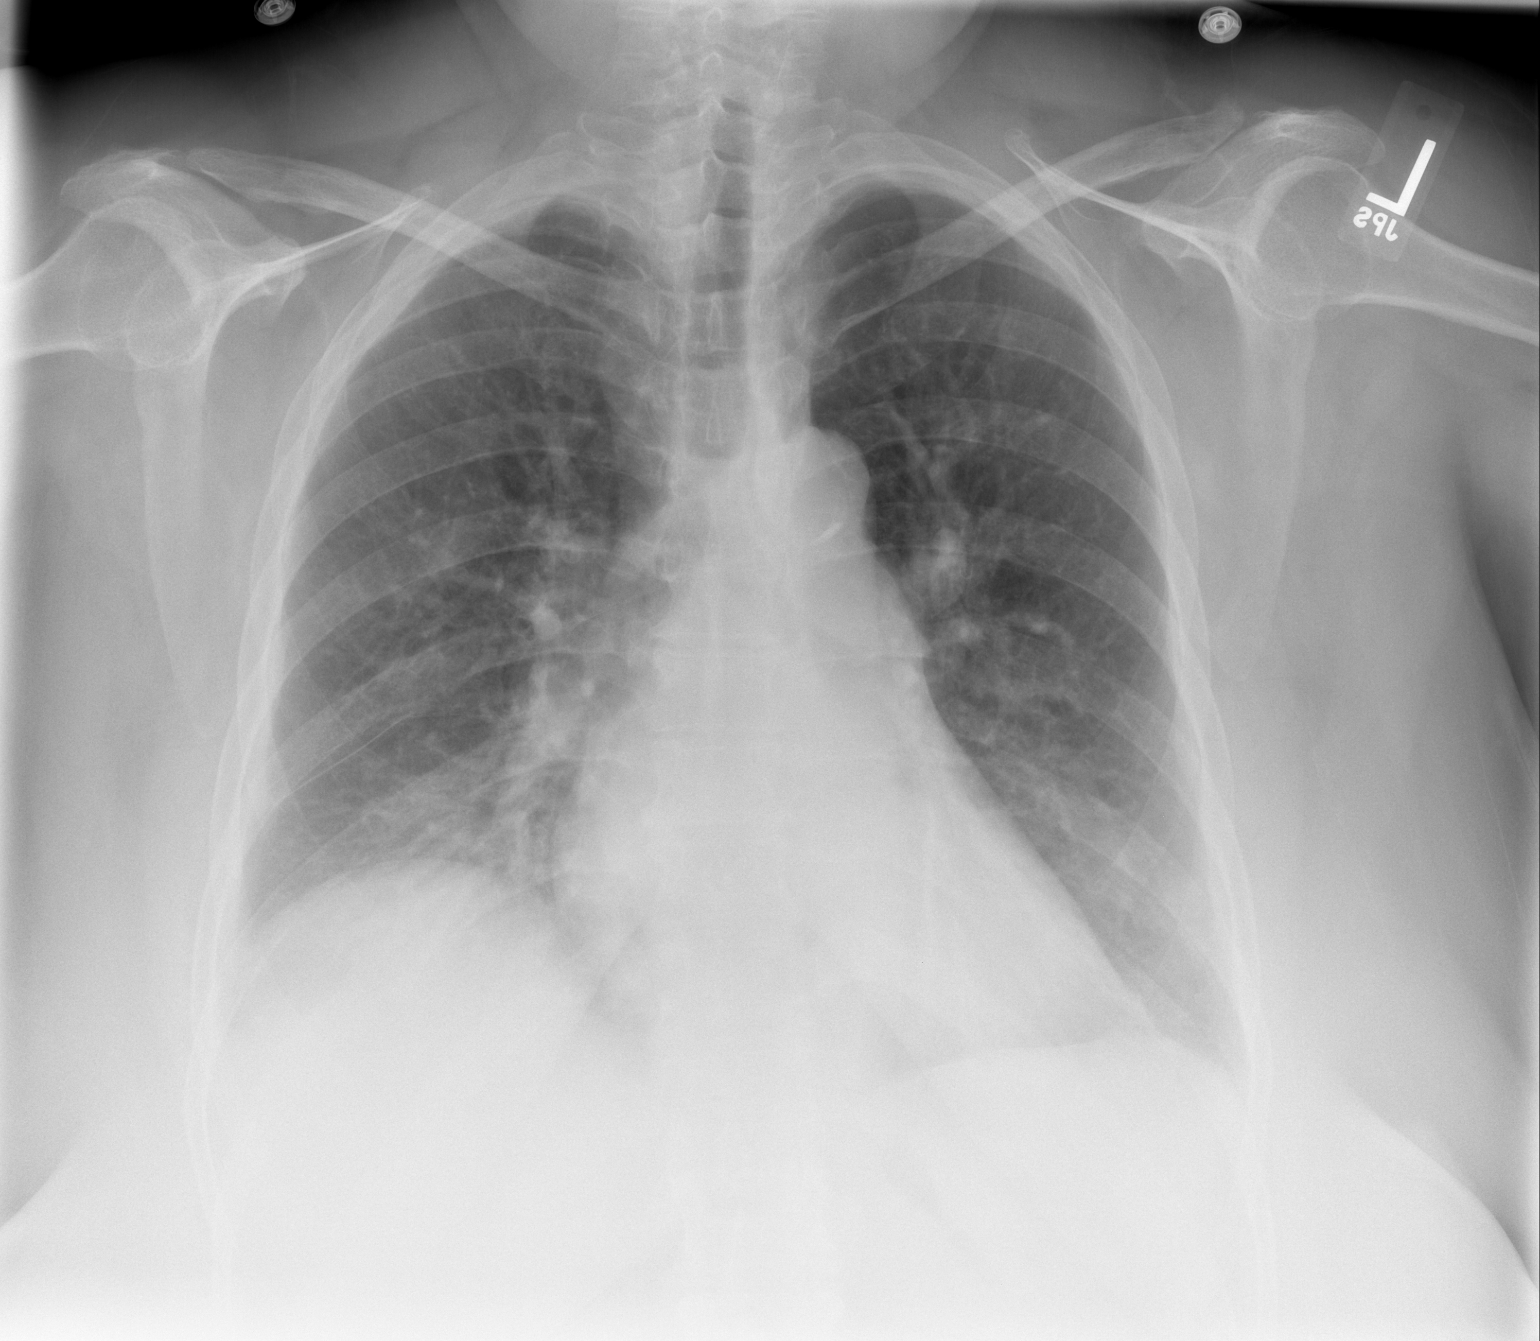

[w chest lat]
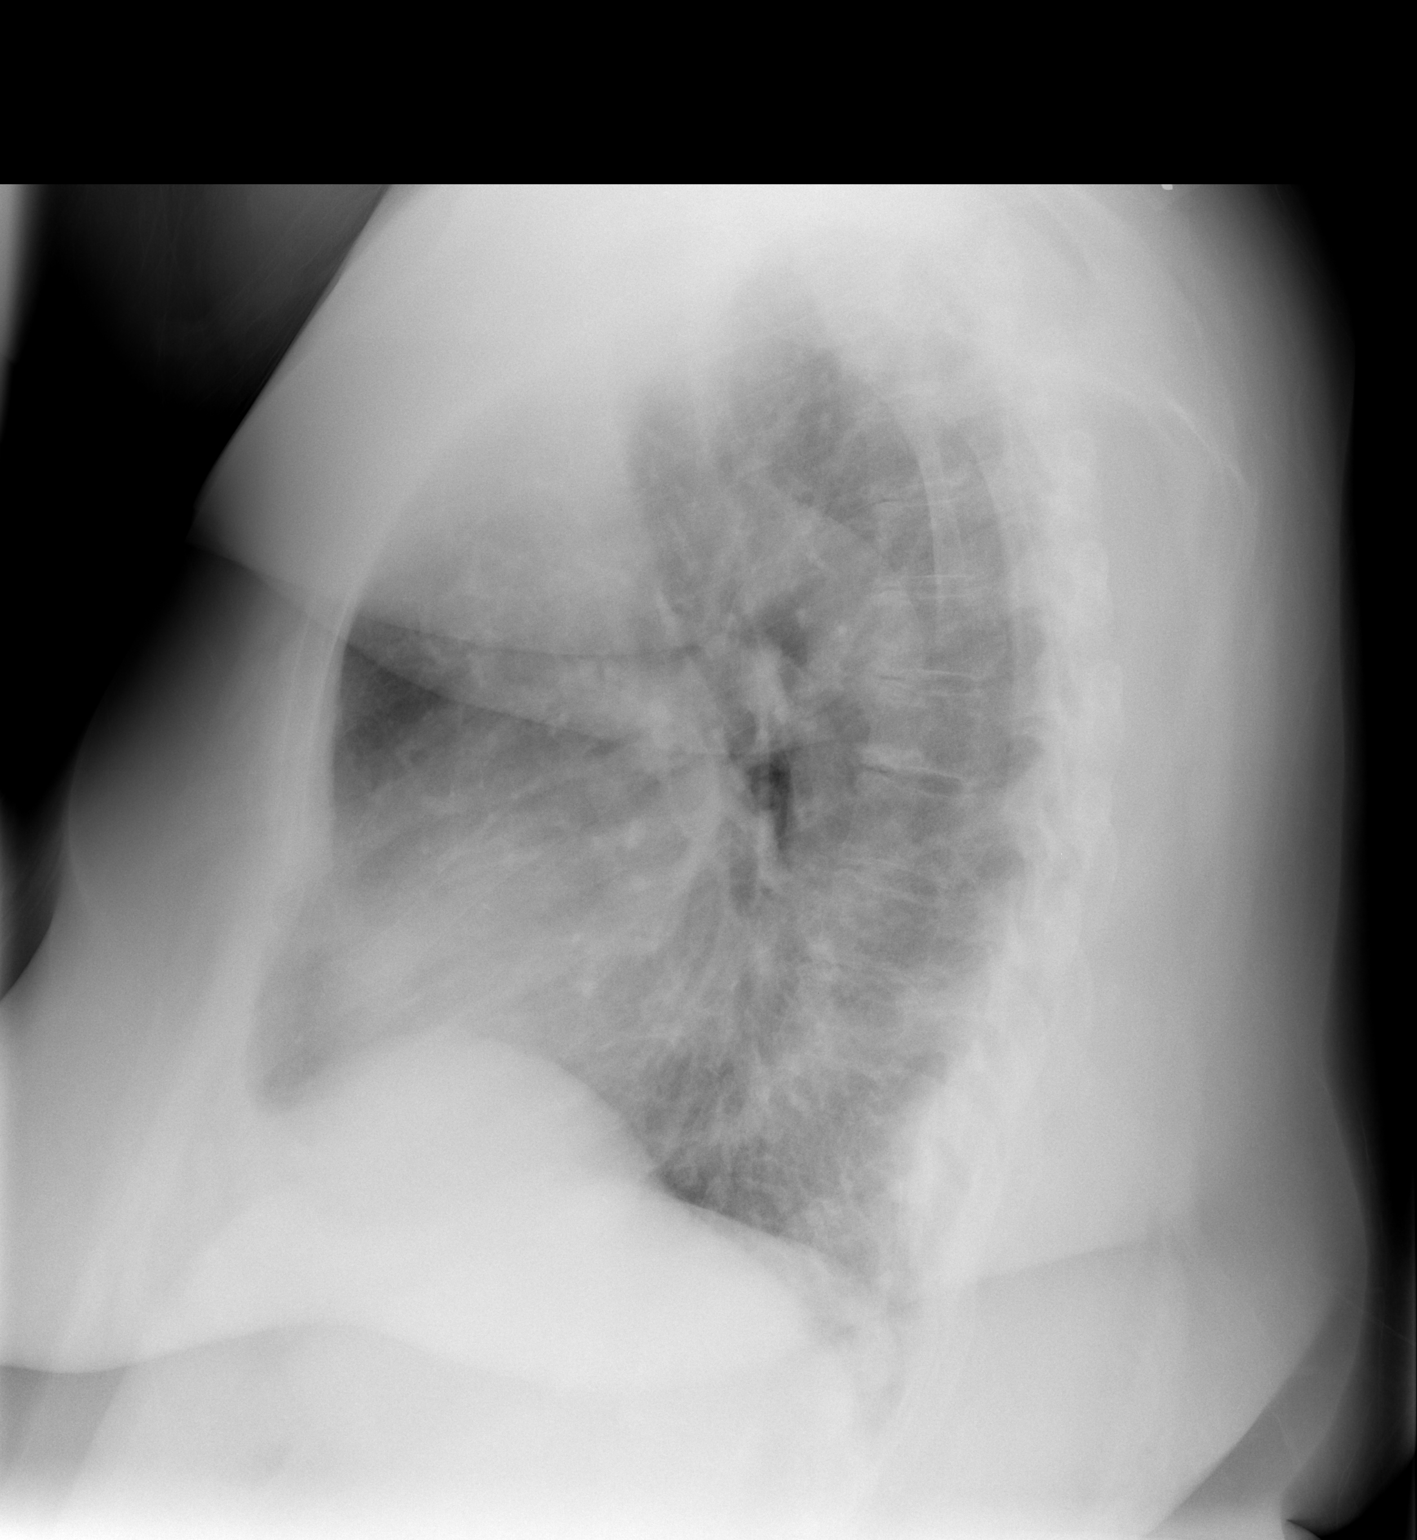

[2 of 2 positions shown; findings below may reference images not displayed]

FINDINGS: Heart size is normal.  There is mild pulmonary vascular
prominence.  No discrete infiltrates or effusions.  No significant
bony abnormality.
IMPRESSION: Mild pulmonary vascular prominence.

## 2010-01-10 ENCOUNTER — Encounter
Admission: RE | Admit: 2010-01-10 | Discharge: 2010-01-10 | Payer: Self-pay | Source: Home / Self Care | Attending: Orthopedic Surgery | Admitting: Orthopedic Surgery

## 2010-01-10 IMAGING — CR DG WRIST COMPLETE 3+V*R*
4 series · 4 of 4 positions shown · non-contrast
Comparison: None.

CLINICAL DATA: Right wrist pain, no injury

RIGHT WRIST - COMPLETE 3+ VIEW

[x wrist pa right]
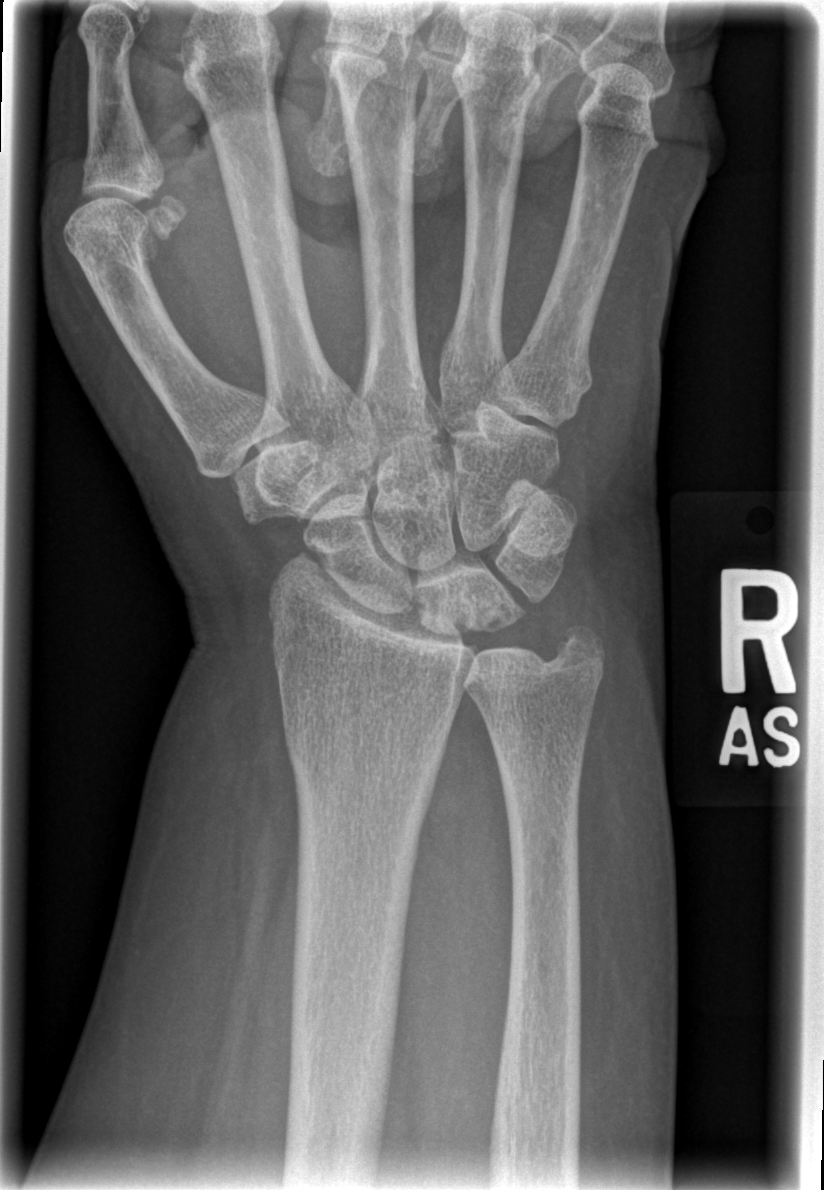

[x wrist obl right]
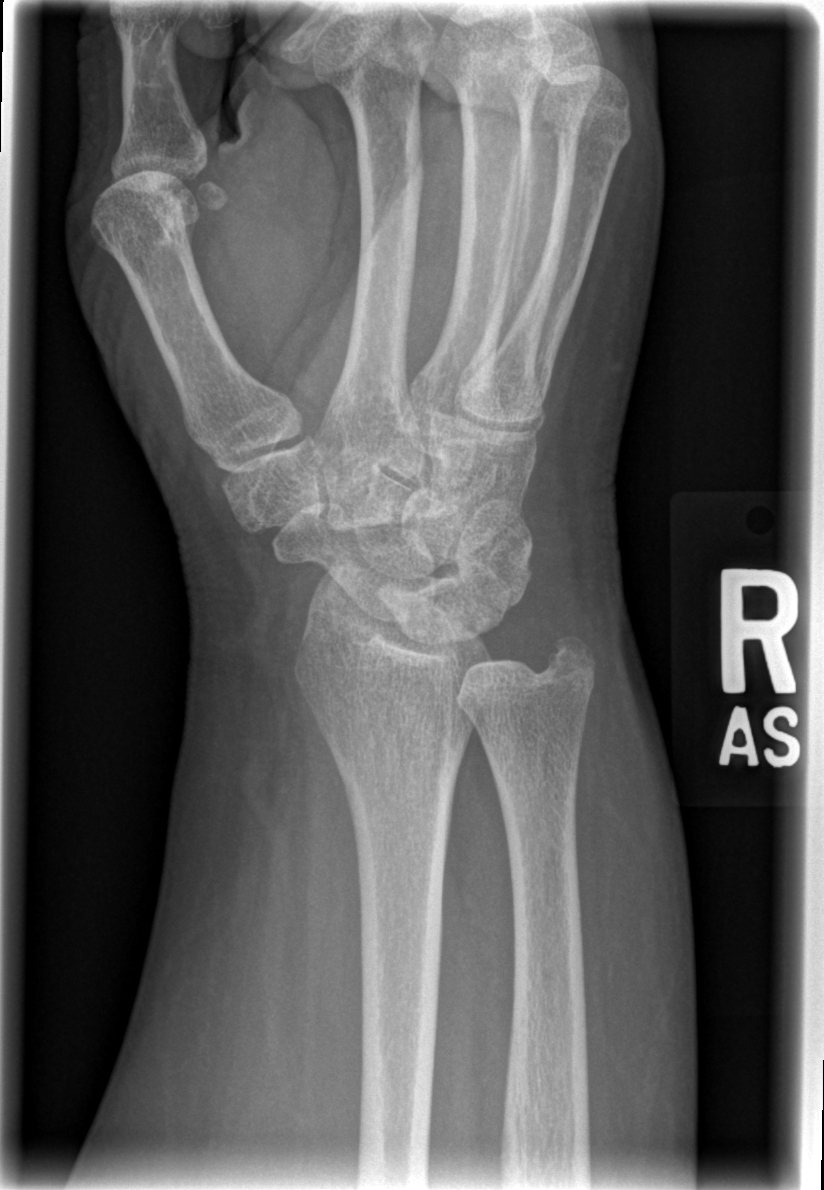

[x wrist lat right]
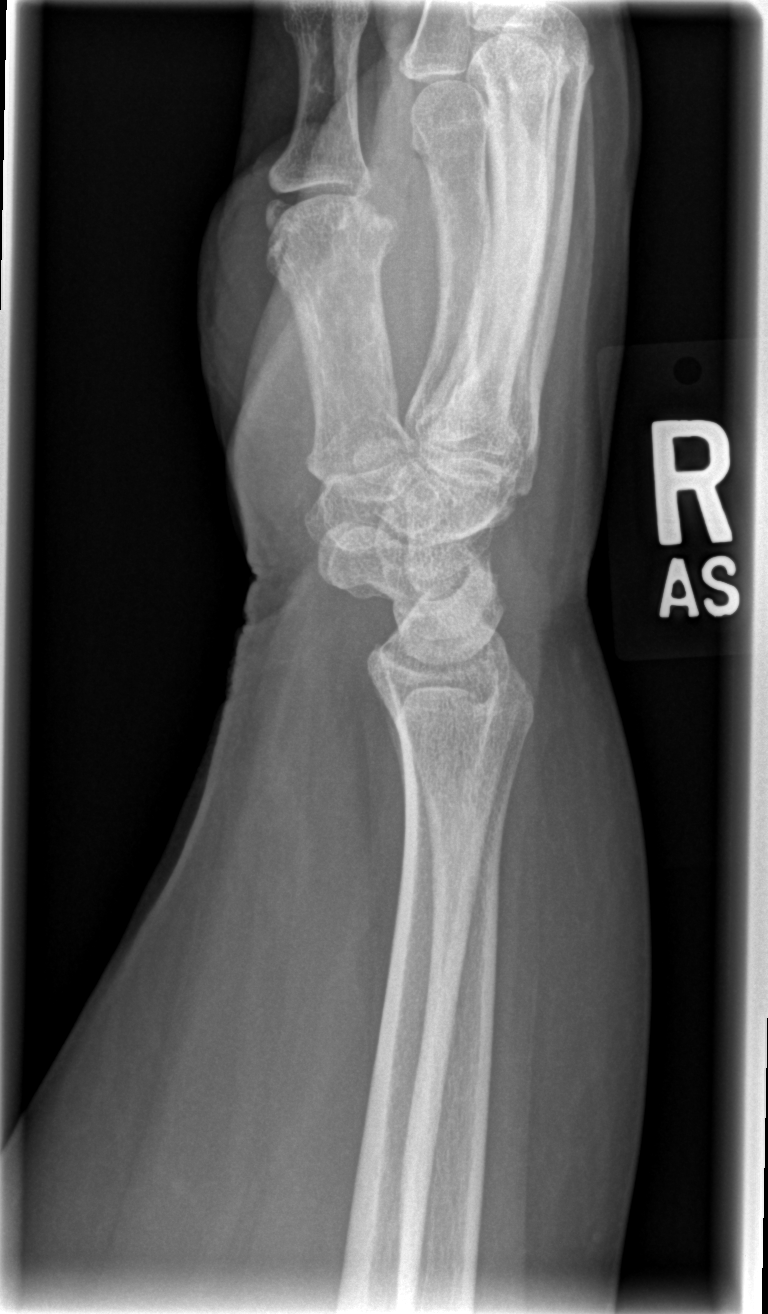

[x navicular]
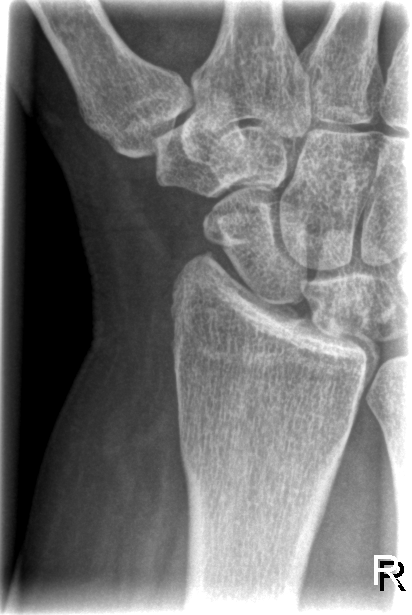

[4 of 4 positions shown; findings below may reference images not displayed]

FINDINGS: The radiocarpal joint space appears normal and the carpal
bones are in normal position.  There are subchondral lucencies
within the lunate most likely representing subchondral cysts.
Intercarpal joint spaces appear normal.
IMPRESSION: Probable subchondral cyst within the lunate.  No acute abnormality.

## 2010-02-19 ENCOUNTER — Encounter
Admission: RE | Admit: 2010-02-19 | Discharge: 2010-02-19 | Payer: Self-pay | Source: Home / Self Care | Attending: Orthopedic Surgery | Admitting: Orthopedic Surgery

## 2010-02-19 IMAGING — CR DG KNEE 1-2V*L*
2 series · 2 of 2 positions shown · non-contrast
Comparison: None

CLINICAL DATA: Left knee pain.

LEFT KNEE - 1-2 VIEW

[t knee ap left]
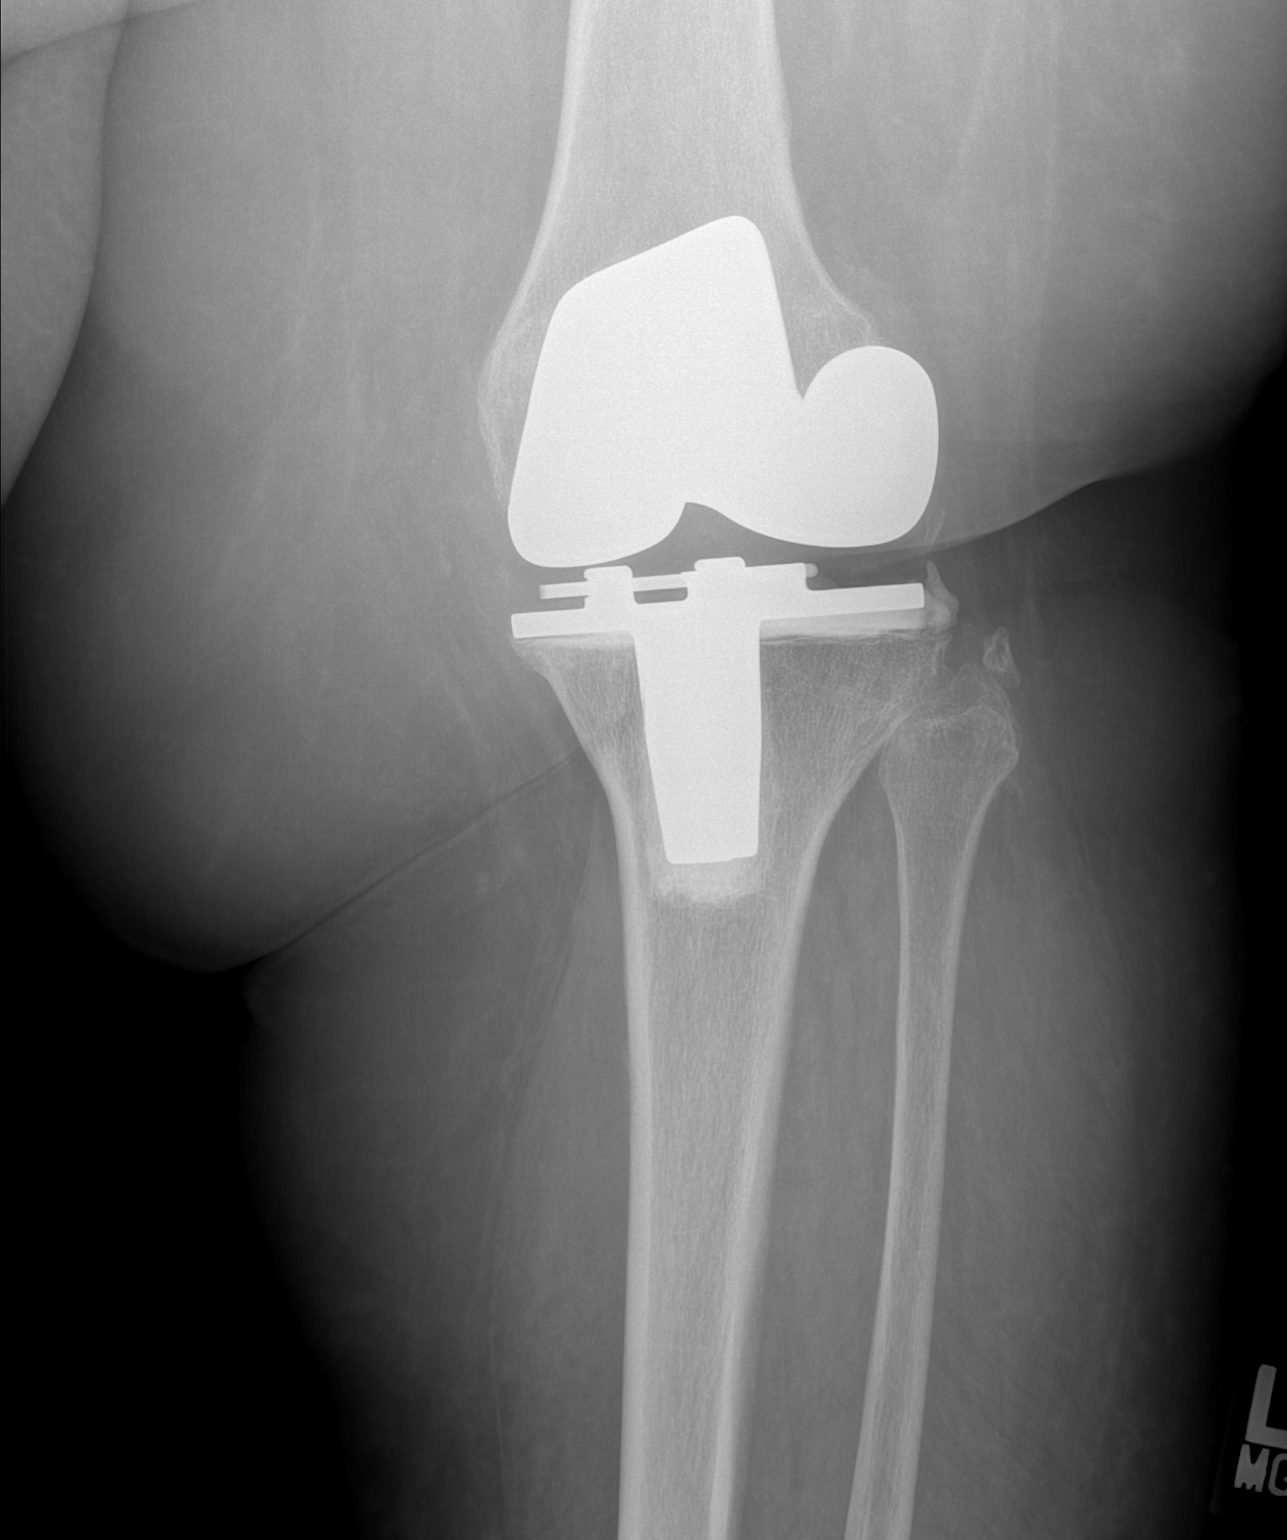

[t knee lat left]
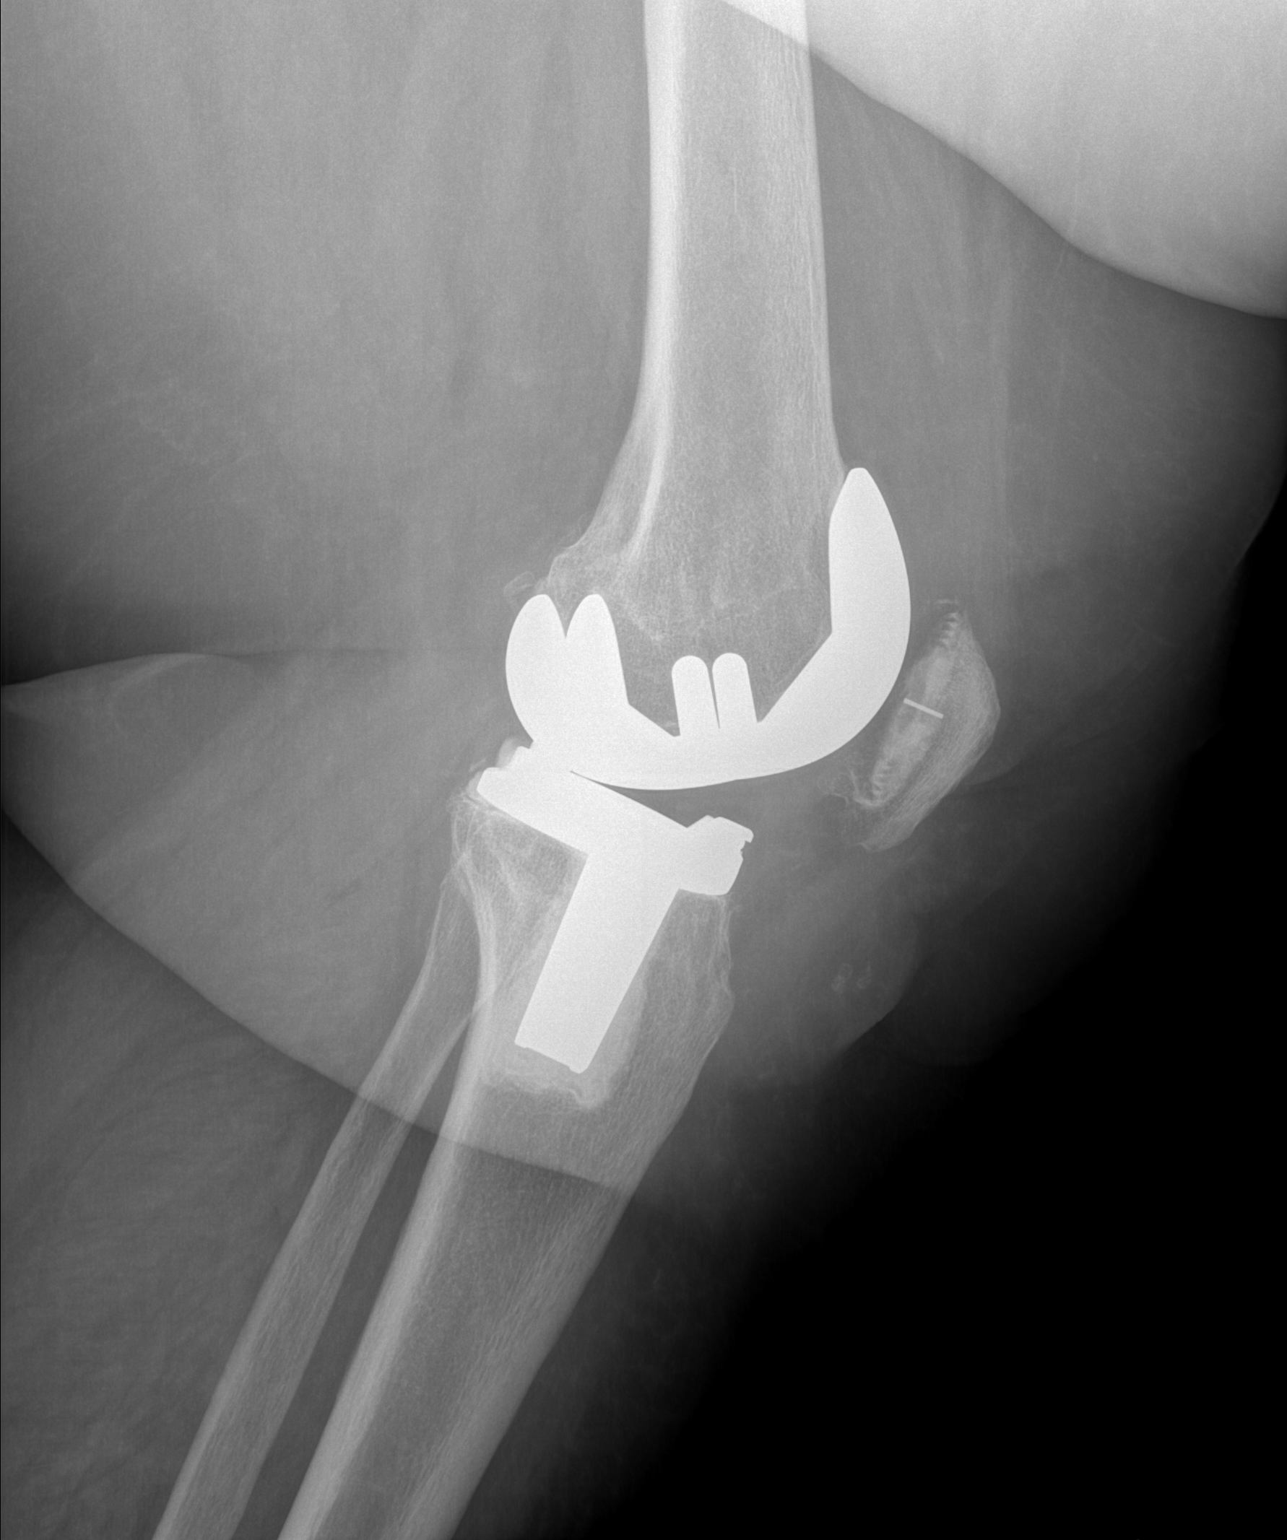

[2 of 2 positions shown; findings below may reference images not displayed]

FINDINGS: Mild lucency at the bone cement interface of the tibial
prosthesis may suggest loosening.  No significant bone cement
medially.  Mild lucency at the interface.  No acute bony findings.
A small joint effusion is suspected.
IMPRESSION: 1.  Mild lucency along the bone cement interface of the tibial
component may suggest loosening.
2.  No acute bony findings.
3.  Joint effusion suspected.

## 2010-04-15 LAB — WOUND CULTURE

## 2010-04-15 LAB — BASIC METABOLIC PANEL
CO2: 29 mEq/L (ref 19–32)
Calcium: 9.5 mg/dL (ref 8.4–10.5)
Chloride: 111 mEq/L (ref 96–112)
Creatinine, Ser: 1.11 mg/dL (ref 0.4–1.2)
GFR calc Af Amer: >60 mL/min (ref >60)
GFR calc non Af Amer: 50 mL/min — ABNORMAL LOW (ref >60)
Glucose, Bld: 91 mg/dL (ref 70–99)
Potassium: 3.4 mEq/L — ABNORMAL LOW (ref 3.5–5.1)
Sodium: 142 mEq/L (ref 135–145)

## 2010-04-15 LAB — CBC
HCT: 30.1 % — ABNORMAL LOW (ref 36.0–46.0)
Hemoglobin: 10 g/dL — ABNORMAL LOW (ref 12.0–15.0)
MCHC: 33.1 g/dL (ref 30.0–36.0)
MCV: 85.3 fL (ref 78.0–100.0)
Platelets: 239 10*3/uL (ref 150–400)
RBC: 3.42 MIL/uL — ABNORMAL LOW (ref 3.87–5.11)
RDW: 16.3 % — ABNORMAL HIGH (ref 11.5–15.5)
WBC: 3.5 10*3/uL — ABNORMAL LOW (ref 4.0–10.5)

## 2010-04-15 LAB — URINE MICROSCOPIC-ADD ON

## 2010-04-15 LAB — IRON AND TIBC
Iron: 71 ug/dL (ref 42–135)
Saturation Ratios: 30 % (ref 20–55)
TIBC: 240 ug/dL — ABNORMAL LOW (ref 250–470)
UIBC: 169 ug/dL

## 2010-04-15 LAB — PROTIME-INR: Prothrombin Time: 14.1 seconds (ref 11.6–15.2)

## 2010-04-15 LAB — URINE CULTURE: Colony Count: 50000

## 2010-04-15 LAB — CULTURE, BLOOD (ROUTINE X 2): Culture: NO GROWTH

## 2010-04-15 LAB — DIFFERENTIAL
Basophils Absolute: 0 10*3/uL (ref 0.0–0.1)
Basophils Relative: 1 % (ref 0–1)
Eosinophils Relative: 2 % (ref 0–5)
Monocytes Absolute: 0.2 10*3/uL (ref 0.1–1.0)

## 2010-04-15 LAB — URINALYSIS, ROUTINE W REFLEX MICROSCOPIC
Bilirubin Urine: NEGATIVE
Ketones, ur: NEGATIVE mg/dL
Nitrite: NEGATIVE
Urobilinogen, UA: 0.2 mg/dL (ref 0.0–1.0)
pH: 5.5 (ref 5.0–8.0)

## 2010-04-15 LAB — VITAMIN B12: Vitamin B-12: 294 pg/mL (ref 211–911)

## 2010-04-15 LAB — FERRITIN: Ferritin: 192 ng/mL (ref 10–291)

## 2010-05-01 ENCOUNTER — Encounter (HOSPITAL_COMMUNITY)
Admission: RE | Admit: 2010-05-01 | Discharge: 2010-05-01 | Disposition: A | Discharge status: Home / Self Care | Payer: Medicaid Other | Source: Ambulatory Visit | Attending: Orthopedic Surgery | Admitting: Orthopedic Surgery

## 2010-05-01 ENCOUNTER — Other Ambulatory Visit (HOSPITAL_COMMUNITY): Payer: Self-pay | Admitting: Orthopedic Surgery

## 2010-05-01 DIAGNOSIS — G5600 Carpal tunnel syndrome, unspecified upper limb: Secondary | ICD-10-CM

## 2010-05-01 LAB — COMPREHENSIVE METABOLIC PANEL
Alkaline Phosphatase: 75 U/L (ref 39–117)
BUN: 25 mg/dL — ABNORMAL HIGH (ref 6–23)
CO2: 26 mEq/L (ref 19–32)
Chloride: 110 mEq/L (ref 96–112)
Creatinine, Ser: 1.14 mg/dL (ref 0.4–1.2)
GFR calc non Af Amer: 48 mL/min — ABNORMAL LOW (ref >60)
Glucose, Bld: 96 mg/dL (ref 70–99)
Potassium: 5.5 mEq/L — ABNORMAL HIGH (ref 3.5–5.1)
Total Bilirubin: 0.9 mg/dL (ref 0.3–1.2)

## 2010-05-01 LAB — URINE MICROSCOPIC-ADD ON

## 2010-05-01 LAB — CBC
HCT: 36.6 % (ref 36.0–46.0)
Platelets: 280 10*3/uL (ref 150–400)
RDW: 14.8 % (ref 11.5–15.5)
WBC: 6.6 10*3/uL (ref 4.0–10.5)

## 2010-05-01 LAB — URINALYSIS, ROUTINE W REFLEX MICROSCOPIC
Hgb urine dipstick: NEGATIVE
Nitrite: NEGATIVE
Specific Gravity, Urine: 1.025 (ref 1.005–1.030)
Urobilinogen, UA: 1 mg/dL (ref 0.0–1.0)

## 2010-05-01 LAB — SURGICAL PCR SCREEN: MRSA, PCR: NEGATIVE

## 2010-05-01 IMAGING — CR DG CHEST 2V
2 series · 2 of 2 positions shown · non-contrast
Comparison: [DATE] and [DATE]

CLINICAL DATA: Preop.

CHEST - 2 VIEW

[view not recorded (1 of 2)]
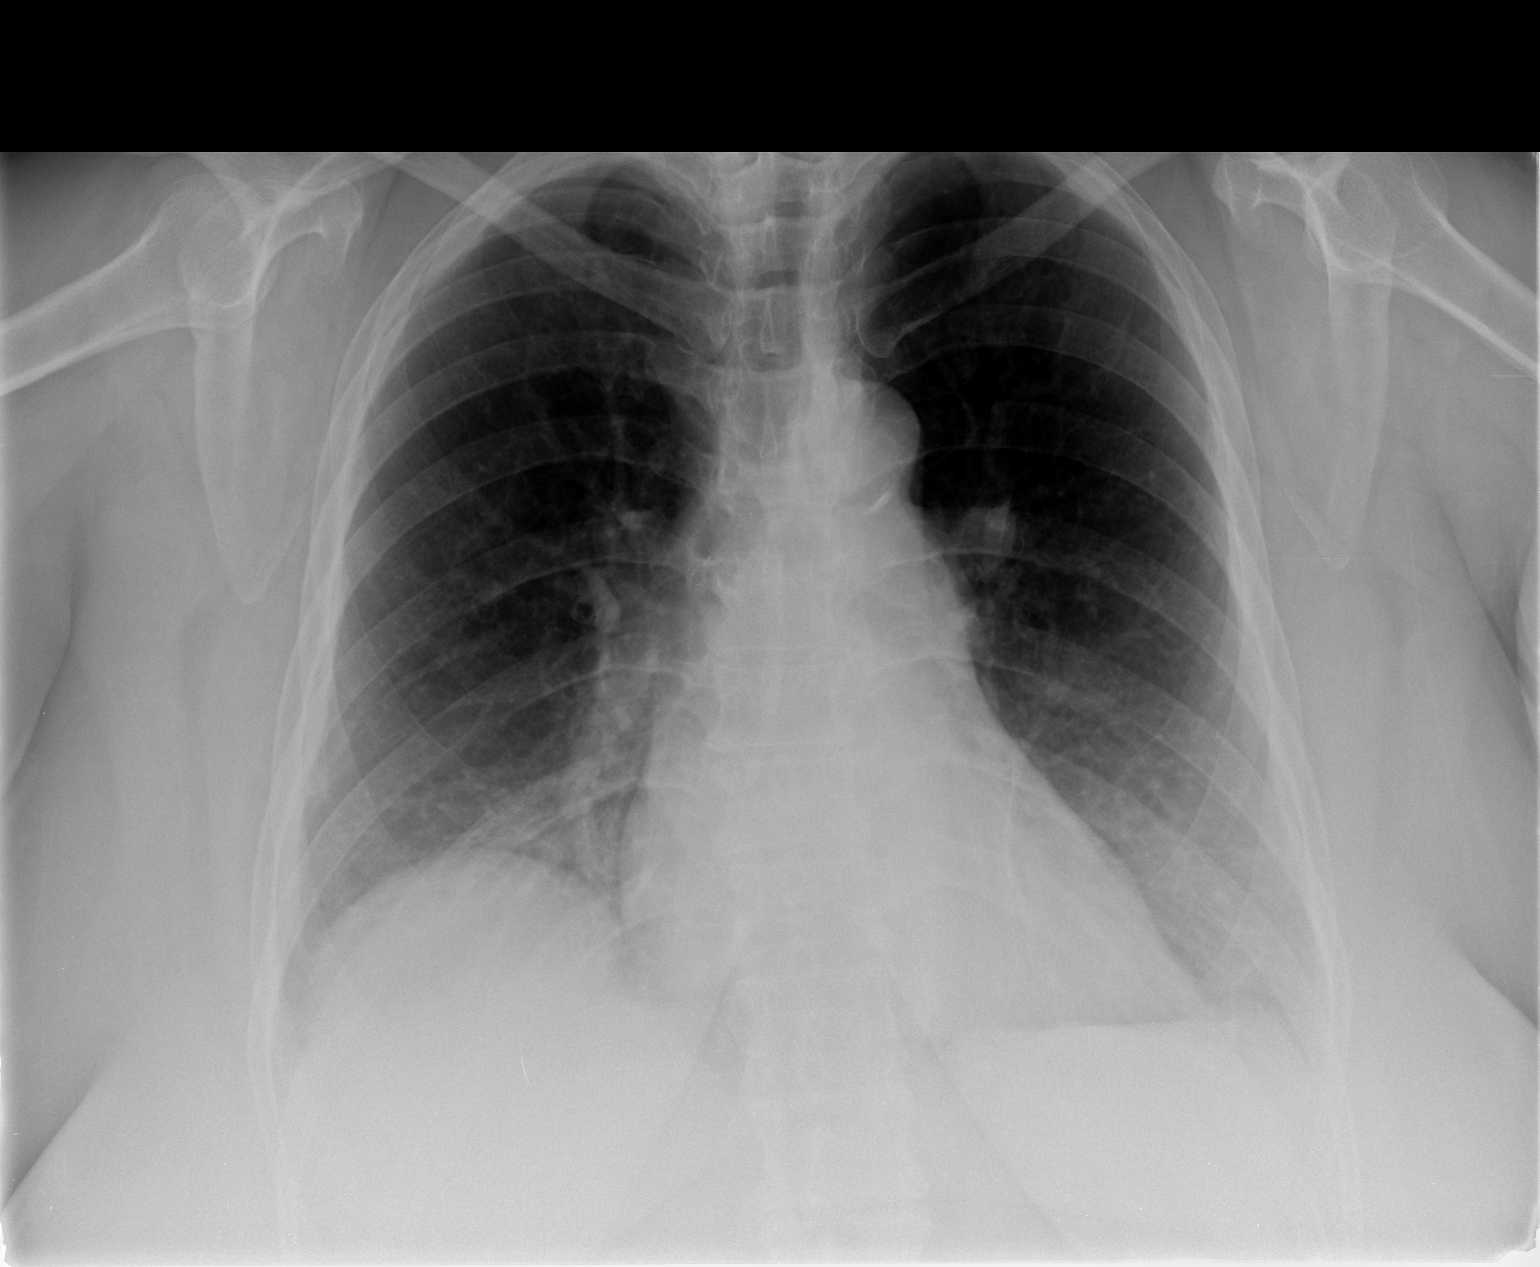

[view not recorded (2 of 2)]
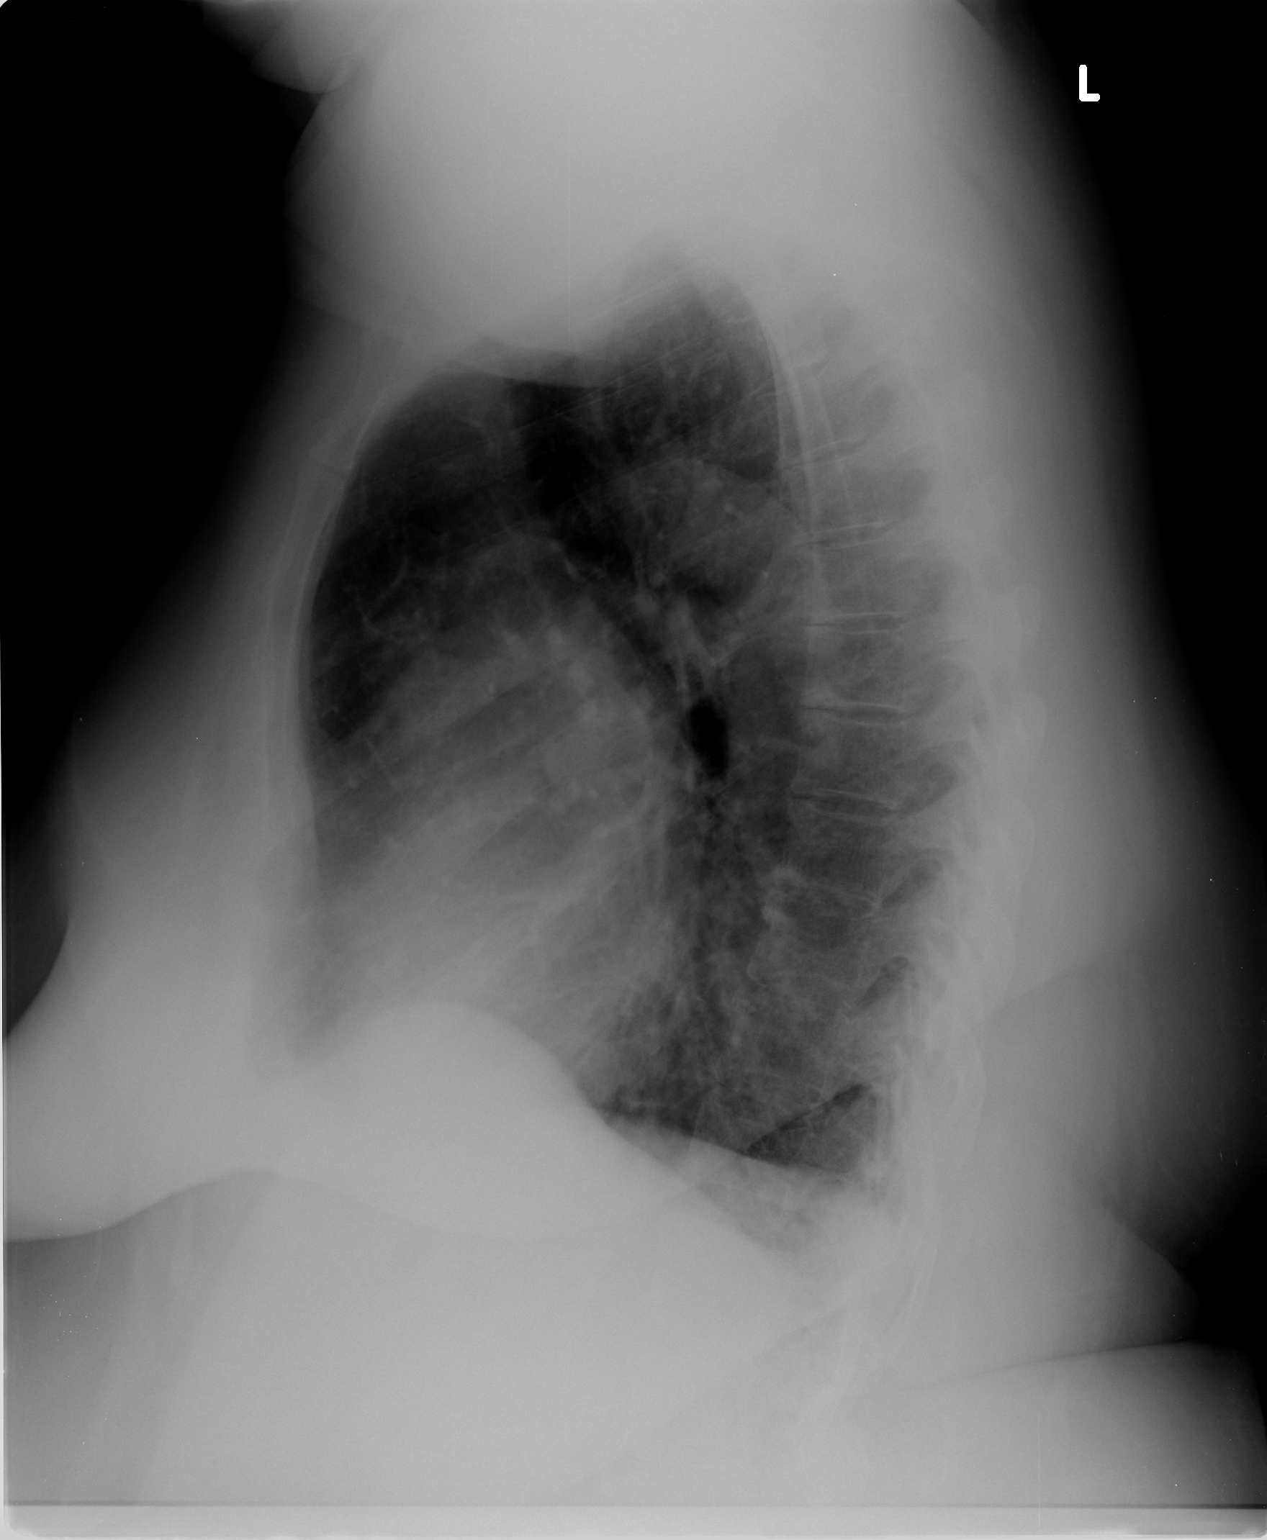

[2 of 2 positions shown; findings below may reference images not displayed]

FINDINGS: Trachea is midline, given presumed rotation of the
patient's head.  Heart size normal.  Thoracic aorta is calcified.
Probable right basilar scarring.  Lungs are otherwise clear.  No
pleural fluid.  Mild pectus deformity.
IMPRESSION: No acute findings.

## 2010-05-08 ENCOUNTER — Ambulatory Visit (HOSPITAL_COMMUNITY)
Admission: RE | Admit: 2010-05-08 | Discharge: 2010-05-08 | Disposition: A | Discharge status: Home / Self Care | Payer: Medicaid Other | Source: Ambulatory Visit | Attending: Orthopedic Surgery | Admitting: Orthopedic Surgery

## 2010-05-08 DIAGNOSIS — Z01818 Encounter for other preprocedural examination: Secondary | ICD-10-CM | POA: Insufficient documentation

## 2010-05-08 DIAGNOSIS — E039 Hypothyroidism, unspecified: Secondary | ICD-10-CM | POA: Insufficient documentation

## 2010-05-08 DIAGNOSIS — Z01812 Encounter for preprocedural laboratory examination: Secondary | ICD-10-CM | POA: Insufficient documentation

## 2010-05-08 DIAGNOSIS — Z87891 Personal history of nicotine dependence: Secondary | ICD-10-CM | POA: Insufficient documentation

## 2010-05-08 DIAGNOSIS — K219 Gastro-esophageal reflux disease without esophagitis: Secondary | ICD-10-CM | POA: Insufficient documentation

## 2010-05-08 DIAGNOSIS — I10 Essential (primary) hypertension: Secondary | ICD-10-CM | POA: Insufficient documentation

## 2010-05-08 DIAGNOSIS — G5600 Carpal tunnel syndrome, unspecified upper limb: Secondary | ICD-10-CM | POA: Insufficient documentation

## 2010-05-08 DIAGNOSIS — Z01810 Encounter for preprocedural cardiovascular examination: Secondary | ICD-10-CM | POA: Insufficient documentation

## 2010-05-08 LAB — URINALYSIS, ROUTINE W REFLEX MICROSCOPIC
Glucose, UA: NEGATIVE mg/dL
Hgb urine dipstick: NEGATIVE
Specific Gravity, Urine: 1.019 (ref 1.005–1.030)
Urobilinogen, UA: 0.2 mg/dL (ref 0.0–1.0)

## 2010-05-08 LAB — COMPREHENSIVE METABOLIC PANEL
AST: 17 U/L (ref 0–37)
Albumin: 3.6 g/dL (ref 3.5–5.2)
BUN: 21 mg/dL (ref 6–23)
Chloride: 110 mEq/L (ref 96–112)
Creatinine, Ser: 0.94 mg/dL (ref 0.4–1.2)
GFR calc Af Amer: >60 mL/min (ref >60)
Potassium: 4.1 mEq/L (ref 3.5–5.1)
Total Bilirubin: 1 mg/dL (ref 0.3–1.2)
Total Protein: 7.1 g/dL (ref 6.0–8.3)

## 2010-05-08 LAB — CBC
Platelets: 269 10*3/uL (ref 150–400)
RDW: 13.8 % (ref 11.5–15.5)
WBC: 7.3 10*3/uL (ref 4.0–10.5)

## 2010-06-11 NOTE — Op Note (Signed)
[name], [name] NO.:  [id number]   MEDICAL RECORD NO.:  [id number]          PATIENT TYPE:  OIB   LOCATION:  2852                         FACILITY:  [facility]   PHYSICIAN:  Marily Memos, MD      DATE OF BIRTH:  Jul 31, 1945   DATE OF PROCEDURE:  09/29/2006  DATE OF DISCHARGE:  09/01/2006                               OPERATIVE REPORT   PREOPERATIVE DIAGNOSIS:  Degenerative arthropathy of right knee.   POSTOPERATIVE DIAGNOSIS:  Degenerative arthropathy of right knee, with  cystic defect in the tibia.   ANESTHESIA:  General.   PROCEDURE:  Right total knee arthroplasty with bone grafting to the  tibia, Biomet implant.  The patient taken to operating room after given  adequate preop medications given general anesthesia intubated, right  knee was prepped with DuraPrep and draped in sterile manner.  Tourniquet  and Bovie used for hemostasis.  Anterior midline incision made over the  right knee going through skin and subcutaneous tissue down to the  capsule.  Sharp and blunt dissection was then made about the capsule and  medial paramedian incision was made into the capsule extending from the  quadriceps down to the tibial tuberosity.  Patella reflected laterally.  Osteophytes about the patella and tibia and femur were resected.  Soft  tissue resection was done.  Medial release soft tissue was done about  the tibia due to the varus deformity.  Tibial cutting jig was put in  place with 10 mm cut after tibial plateau surface resected.  Large  cystic defect was noted in the tibia which was previously visible on the  plain x-ray.  This was packed with the bone graft.  Next IM reaming down  the femoral canal was done.  Distal femoral cutting jig was put in place  and distal femoral cut was made.  Sizing on the femur was then done  which was 65 mm.  Anterior and posterior cuts and chamfer cuts were  done.  Femoral trial was put in place and found to be very good snug  fit.   Attention was turned back to the tibia.  Sizing of the tibia was  that of 71 mm appropriate cutting jig was put in place and appropriate  cuts were made.  Further bone grafting was done to the tibia.  Trial  component both tibia and femoral components put in place, full  extension, full flexion, good medial and lateral stability.  Next sizing  of the patella was done which was medium 34 mm and appropriate cuts were  made.  With patella, femur and the tibial components in place, range of  motion full extension, full flexion, good medial lateral stability was  noted with 14 mm poly.  Trial components were removed.  Methyl  methacrylate was mixed and the tibial and patellar components were  cemented.  The femoral component was press fitted.  After excess methyl  methacrylate was removed and the cement had set, again range of motion  full flexion, full extension, good medial and lateral stability.  No  subluxation of the  patella.  The final 14 mm poly was put in place and  locked. Hemostasis obtained with a tourniquet down.  Copious irrigation  was done.  Wound closure was then done with 0 for the fascia, 2-0 for  the subcutaneous and skin staples for skin.  Bulky compressive dressing  was applied.  The patient tolerated procedure quite well went to  recovery room in stable and satisfactory condition.      Marily Memos, MD  Electronically Signed     AC/MEDQ  D:  11/04/2006  T:  11/04/2006  Job:  [id number]

## 2010-06-11 NOTE — Cardiovascular Report (Signed)
NAME:  HOOKSAltair, Stanko                  ACCOUNT NO.:  [id number]   MEDICAL RECORD NO.:  [id number]          PATIENT TYPE:  OIB   LOCATION:  2852                         FACILITY:  [facility]   PHYSICIAN:  Mohan N. Terrence Dupont, M.D. DATE OF BIRTH:  1945-09-18   DATE OF PROCEDURE:  09/01/2006  DATE OF DISCHARGE:                            CARDIAC CATHETERIZATION   PROCEDURE:  Left cardiac cath with selective left and right coronary  angiography, LV graft via right groin using Judkins' technique.   INDICATIONS FOR PROCEDURE:  Ms. Korte is a 65 year old black female with  past medical history significant for hypertension, hypercholesteremia,  hypothyroidism, rheumatoid arthritis, morbid obesity, degenerative joint  disease, was seen in my office because of vague retrosternal chest pain  off and on without associated symptoms of nausea, vomiting, diaphoresis.  Also complains of exertional dyspnea with minimal exertion.  The patient  was seen for pre-op clearance for right total knee replacement.  The  patient subsequently underwent Persantine Myoview on August 26, 2006,  which showed mild-to-moderate reversible ischemia involving the anterior  and anterolateral wall and apical and mid segments with normal EF.  Due  to typical chest pain and abnormal Persantine Myoview, I discussed with  the patient regarding left cath, possible PTCA stenting, its risks and  benefits i.e. death, MI, stroke, need for emergency CABG, risk of re-  stenosis, local vascular complications, etc., and consented for the  procedure.   PROCEDURE:  After obtaining informed consent, the patient was brought to  the cath lab and was placed on fluoroscopy table.  Right groin was  prepped and draped in usual fashion.  Xylocaine 2% was used for local  anesthesia in the right groin.  With the help of thin-wall needle, a 6-  French arterial sheath was placed.  The sheath was aspirated and  flushed.  Next, a 6-French left Judkins catheter  was advanced over the  wire under fluoroscopic guidance into the ascending aorta.  Wire was  pulled out.  The catheter was aspirated and connected to the manifold.  Catheter was further advanced and engaged into left coronary ostium.  Multiple views of the left system were taken.  Next, the catheter was  disengaged and was pulled out over the wire and was replaced with a 6-  French right Judkins' catheter which was advanced over the wire under  fluoroscopic guidance up to the ascending aorta.  Wire was pulled out.  The catheter was aspirated and connected to the manifold.  Catheter was  further advanced and engaged into right coronary ostium.  Multiple views  of the right system were taken.  Next, the catheter was disengaged and  was pulled out over the wire and was replaced with a 6-French pigtail  catheter, which was advanced over the wire under fluoroscopic guidance  up to the ascending aorta.  Catheter was further advanced across the  aortic valve into the LV.  LV pressures were recorded.  Next LV graft  was done in 30-degree RAO position.  Post-angiographic pressures were  recorded from LV, and then pullback pressures were recorded from  the  aorta.  There was no gradient across the aortic valve.  Next, the  pigtail catheter was pulled out over the wire.  Sheaths were aspirated  and flushed.   FINDINGS:  LV showed good LV systolic function, EF of 41% to 60%.  Left  main has 15% to 20% distal stenosis.  LAD has 5% to 10% ostial stenosis  and 20% to 25% proximal stenosis.  Diagonal 1 and 2 were very small.  Left circumflex has 20% to 30% proximal stenosis.  OM1 was moderate size  which has 20% to 25% mid-stenosis.  OM-2  and OM III were very small, which were patent.  RCA was small,  nondominant which was patent.  The patient tolerated the procedure well.  There are no complications.  Plan is to continue medical management.  The patient is acceptable risk for right total knee  replacement from  cardiac point of view.      Allegra Lai. Terrence Dupont, M.D.  Electronically Signed     MNH/MEDQ  D:  09/01/2006  T:  09/01/2006  Job:  [id number]

## 2010-06-11 NOTE — Discharge Summary (Signed)
NAMEIVOREE, FELMLEE NO.:  [id number]   MEDICAL RECORD NO.:  [id number]          PATIENT TYPE:  INP   LOCATION:  5041                         FACILITY:  [facility]   PHYSICIAN:  Marily Memos, MD      DATE OF BIRTH:  [birth year]   DATE OF ADMISSION:  09/29/2006  DATE OF DISCHARGE:  10/03/2006                               DISCHARGE SUMMARY   ADMISSION DIAGNOSES:  1. Degenerative arthritis right knee.  2. History of hypertension.  3. History of hyperlipidemia.  4. Morbid obesity.  5. Rheumatoid arthritis.   DISCHARGE DIAGNOSES:  1. Degenerative arthritis right knee.  2. History of hypertension.  3. History of hyperlipidemia.  4. Morbid obesity.  5. Rheumatoid arthritis.   COMPLICATIONS:  None.   INFECTIONS:  None.   OPERATIONS:  Right total knee arthroplasty with bone grafting.   PERTINENT HISTORY:  This is a 65 year old female who we have followed in  the office for degenerative joint disease involving the right knee with  progressive worsening over the past few months.  The patient has been  treated with therapeutic injections, anti-inflammatories and pain  medications and with use of cane for ambulation.  Pertinent physical was  that of the right knee.  The patient ambulates with a antalgic gait,  genu varum, crepitus medial lateral compartment +2 effusion.  Range of  motion is good, but limited in the left on extension and flexion with a  slight flexion contracture.  Negative Homans' test.  Neurovascular  status intact.  X-rays revealed loss of medial compartment with  osteophytes and sclerosis.   PERTINENT LABORATORIES:  The patient had CBC, EKG, chest x-ray, PT/PTT,  platelet count, C-met, UA.  The patient's laboratories found be stable  enough to undergo surgery.  The patient underwent right total knee  arthroplasty on September 29, 2006, tolerated procedure quite well.  Postoperative course was fairly benign.  The patient started on pre and  postop  IV antibiotics, prophylactic Coumadin therapy.  Physical therapy,  occupational therapy and social services.  The patient progressed with  physical therapy with partial weightbearing on the right side with use  of CPM, walker.  The patient's pain was brought under control.  The  patient was stable.  The patient's last INR was 1.6, H&H was stable.  The patient was able to be discharged to home with home health and  physical  therapy.  Partial weightbearing on the right side with use of walker,  Percocet one to two q.4 h p.r.n. pain, Coumadin therapy.  INRs to be  checked by home health nurse and return to the office in one week.  The  patient was discharged in stable satisfactory condition.      Marily Memos, MD  Electronically Signed     AC/MEDQ  D:  11/04/2006  T:  11/04/2006  Job:  [id number]

## 2010-06-11 NOTE — Op Note (Signed)
NAME:  [name], [name]                  ACCOUNT NO.:  [id number]   MEDICAL RECORD NO.:  [id number]          PATIENT TYPE:  AMB   LOCATION:  SDS                          FACILITY:  [facility]   PHYSICIAN:  Marily Memos, MD      DATE OF BIRTH:  1945-12-26   DATE OF PROCEDURE:  05/24/2008  DATE OF DISCHARGE:  05/24/2008                               OPERATIVE REPORT   PREOPERATIVE DIAGNOSIS:  Foreign body, right heel.   POSTOPERATIVE DIAGNOSIS:  Foreign body, right heel.   ANESTHESIA:  General.   PROCEDURE:  Excision of foreign body in right heel.   The patient was taken to the operating room and given adequate preop  medications, given general anesthesia and intubated.  Right foot was  prepped with DuraPrep and draped in sterile manner.  Bovie used for  hemostasis.  An elliptical excision was made around the granulomatous  foreign body at the heel down to the fascial tissue.  This was excised  in toto.  Bovie used for hemostasis.  Irrigation was done.  Wound  closure was then done with 3-0 nylon.  Compressive dressing was applied.  The patient tolerated the procedure quite well, went to the recovery  room in stable and satisfactory condition.  The patient has been  discharged home with use of walker, fracture shoe, partial weightbearing  on the right side.  Percocet 1 q.6 p.r.n. and return to the office in 1  week.  The patient has been discharged in stable and satisfactory  condition.      Marily Memos, MD  Electronically Signed     AC/MEDQ  D:  05/24/2008  T:  05/24/2008  Job:  [id number]

## 2010-06-13 NOTE — H&P (Signed)
  NAMEMAYTE, DIERS NO.:  [id number]  MEDICAL RECORD NO.:  [id number]           PATIENT TYPE:  LOCATION:                                 FACILITY:  PHYSICIAN:  Marily Memos, MD      DATE OF BIRTH:  [birth year]  DATE OF ADMISSION: DATE OF DISCHARGE:                             HISTORY & PHYSICAL   CHIEF COMPLAINT:  Painful numbness, right hand and fingers.  HISTORY OF PRESENT ILLNESS:  This is a 65 year old female being followed in the office for carpal tunnel syndrome, bilateral right being worse than left over the past several years.  The patient's symptoms on the right side progressively worsened over the past few months.  The patient has had wrist splinting, antiinflammatories to the right wrist with progressive worsening of symptoms.  The patient had developed persistent pain, hyperesthesia, persistent tingling with the recent splinting, and progressive weakness with loss of grip in the right hand. PAST MEDICAL HISTORY:  Hypertension, hypothyroidism, obesity, reflux, degenerative joint disease, and total knee arthroplasty.  ALLERGIES:  None known.  MEDICATIONS: 1. Lorcet 10. 2. Multivitamins. 3. Meclizine 25 mg. 4. Simvastatin 40 mg. 5. Synthroid 50 mcg. 6. Methotrexate 2.5 mg. 7. Lasix 20 mg. 8. Folic acid 1 mg. 9. Ferrous sulfate 325 mg.  REVIEW OF SYSTEMS:  Degenerative joint disease involving lumbar spine and left carpal tunnel symptoms.  No cardiac, respiratory, no urinary or bowel symptoms.  SOCIAL HISTORY:  Negative for use of alcohol or tobacco or illegal drugs.  FAMILY HISTORY:  Hypertension.  PHYSICAL EXAMINATION:  VITAL SIGNS:  Temperature 98.7, pulse 85, respirations 18, blood pressure 150/82, O2 saturation 98%. GENERAL:  Obese, alert and oriented, in no acute distress. HEENT:  Head normocephalic.  Eyes, conjunctivae and sclerae clear. NECK:  Supple. CHEST:  Clear. CARDIAC:  S1 and S2 regular. EXTREMITIES:  Right wrist  positive Tinel's, positive Phalen's test. Weak intrinsics with some thenar atrophy in the right hand, hyperesthesia.  Nerve conduction test demonstrated carpal tunnel syndrome, right wrist.  IMPRESSION:  Carpal tunnel syndrome, right wrist.     Marily Memos, MD     AC/MEDQ  D:  05/08/2010  T:  05/09/2010  Job:  [id number]  Electronically Signed by Marily Memos MD on 06/13/2010 03:07:42 PM

## 2010-06-13 NOTE — H&P (Signed)
  NAMEHIILANI, JETTER NO.:  [id number]  MEDICAL RECORD NO.:  [id number]           PATIENT TYPE:  LOCATION:                                 FACILITY:  PHYSICIAN:  Marily Memos, MD      DATE OF BIRTH:  October 06, 1945  DATE OF ADMISSION: DATE OF DISCHARGE:                             HISTORY & PHYSICAL   PREOPERATIVE DIAGNOSIS:  Carpal tunnel syndrome, right wrist.  POSTOPERATIVE DIAGNOSIS:  Carpal tunnel syndrome, right wrist.  ANESTHESIA:  GENERAL.  PROCEDURE:  Carpal tunnel release, right wrist.  The patient was taken to the operating room after given adequate preop medications, given general anesthesia and intubated.  Right forearm and hand was prepped up to the elbow.  Tourniquet and Bovie were used for hemostasis.  Careful incision made over the right transverse carpal ligament, going through the skin and subcutaneous tissue.  Median nerve was identified proximally.  Valora Corporal was placed underneath the fascia and complete release was done.  Nerve itself was obviously seen to be completely flat, compressed at distal to the transverse carpal ligament. Copious irrigation was done.  Skin closure was then done with 4-0 nylon. Compressive dressing was applied.  The patient tolerated the procedure quite well, went to recovery room in stable satisfactory condition.  The patient is being discharged home.  Ice packs, elevation, use of sling. Percocet 1 q.6 p.r.n. for pain and to return to the office in 1 week. The patient is being discharged in stable and satisfactory condition.     Marily Memos, MD     AC/MEDQ  D:  05/08/2010  T:  05/09/2010  Job:  [id number]  Electronically Signed by Marily Memos MD on 06/13/2010 03:07:46 PM

## 2010-06-14 NOTE — Op Note (Signed)
NAME:  HOOKSMaudine, Kluesner                  ACCOUNT NO.:  [id number]   MEDICAL RECORD NO.:  [id number]          PATIENT TYPE:  AMB   LOCATION:  ENDO                         FACILITY:  [facility]   PHYSICIAN:  Nelwyn Salisbury, M.D.  DATE OF BIRTH:  1945-10-18   DATE OF PROCEDURE:  01/13/2005  DATE OF DISCHARGE:                                 OPERATIVE REPORT   PROCEDURE PERFORMED:  Colonoscopy with ileostomy with multiple cold  biopsies.   ENDOSCOPIST:  Nelwyn Salisbury, M.D.   INSTRUMENT USED:  Olympus video colonoscope.   INDICATIONS FOR PROCEDURE:  A 65 year old African-American female with a  history of sigmoid colectomy and colostomy for complicated diverticulitis,  undergoing a repeat colonoscopy for blood in stool, change in bowel habits,  and abdominal pain.  Patient had an abnormal CT scan done in the recent past  that revealed colon polyps, __________.   PRE-PROCEDURE PREPARATION:  Informed consent was procured from the patient.  The patient fasted for eight hours prior to the procedure and prepped with a  bottle of magnesium citrate and a gallon of GoLYTELY the night prior to the  procedure.  Risks and benefits of the procedure, including __________ were  discussed with the patient as well.   PRE-PROCEDURE PHYSICAL:  VITAL SIGNS:  Stable vital signs.  NECK:  Supple.  CHEST:  Clear to auscultation.  HEART:  S1 and S2.  Regular.  ABDOMEN:  Soft with a large ventral hernia present.  Obese.  Diffusely  tender with normal bowel sounds.  The colostomy is present in the left lower  quadrant.   DESCRIPTION OF PROCEDURE:  The patient was placed in the left lateral  decubitus position and sedated with fentanyl and Versed.  Once the patient  was adequately sedated, maintained on low flow oxygen with continuous  cardiac monitoring.  The Olympus videoscopic colonoscope was advanced  through the ileostomy up to the cecum and terminal ileum.  Large ulcers were  noted in the cecum, which may  be secondary to the patient's use of sulindac.  Multiple cold biopsies were done.  The appendicle orifices and cecum were  clearly visualized.  The terminal ileum appeared normal.  There were a few  scattered diverticula seen throughout the remaining colon.  No masses or  polyps were identified.  The patient tolerated the procedure well without  complications.   IMPRESSION:  1.  Large cecal ulceration, probably secondary to nonsteroidal use.      Biopsies and results pending.  2.  A few scattered diverticula.  3.  Normal terminal ileum.   RECOMMENDATIONS:  1.  Await pathology results.  2.  Follow up with urologist for abnormalities seen on recent CT.  Question      colovesical fistula.  3.  Discontinue all nonsteroidals, including aspirin, for now.  4.  Outpatient followup in the office in the next two weeks, earlier if need      be.      Nelwyn Salisbury, M.D.     JNM/MEDQ  D:  01/13/2005  T:  01/15/2005  Job:  [id number]  cc:   Allegra Lai. Terrence Dupont, M.D.  Fax: [phone]   Odis Hollingshead, M.D.  [address], [address]  [place]  [place] [address]   Marily Memos, MD  Fax: [phone]

## 2010-06-14 NOTE — Op Note (Signed)
[place]. [facility]  Patient:    [name], [name] Visit Number: [id number] MRN: [id number]          Service Type: END Location: ENDO Attending Physician:  Juanita Craver Dictated by:   Sharmon Revere, M.D. Proc. Date: 11/04/00 Admit Date:  10/23/2000 Discharge Date: 10/23/2000                             Operative Report  PREOPERATIVE DIAGNOSIS:  Internal derangement of the right knee.  POSTOPERATIVE DIAGNOSES: 1. 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 2. 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  OPERATIONS: 1. Arthroscopic complete synovectomy. 2. Chondroplasty of medial femoral condyle.  SURGEON:  Sharmon Revere, M.D.  ANESTHESIA:  General.  DESCRIPTION OF PROCEDURE:  The patient was taken to the operating room after being adequate preoperative medication and was given general anesthesia and intubated.  The right knee was prepped with DuraPrep and draped in a sterile manner.  A 1/2-inch puncture wound was made along the anterior, medial, and lateral joint line.  Inflow was in the media suprapatellar pouch area. Inspection of the joint revealed loose fragments and completely  hypertrophic synovial thickening of both medial and lateral compartments and a large chondral defect involving the medial femoral condyle with a burst-type fragmentation.  With the synovial shaver, a complete synovectomy was done with removal of the plica, the chondroplasty with removal of the loose fragments, and smoothing of the chondral defect.  This was followed by roughening of the area.  Other loose bodies were found in the lateral recess and were removed. Copious and abundant irrigation was then done followed by wound closure with 4-0 nylon followed by 10 cc of 0.5% Marcaine injected into the joint and a compressive dressing.  The patient tolerated the procedure quite well and went to the recovery room in a stable and satisfactory  condition.  The patient is being discharged with partial weightbearing on the right side. She is to use crutches.  To return to the office in one week.  The patient is being discharged in stable and satisfactory condition. Dictated by:   Sharmon Revere, M.D. Attending Physician:  Juanita Craver DD:  11/04/00 TD:  11/04/00 Job: [id number]

## 2010-06-14 NOTE — Op Note (Signed)
NAMEARIANY, KESSELMAN NO.:  [id number]   MEDICAL RECORD NO.:  [id number]                   PATIENT TYPE:  INP   LOCATION:  5029                                 FACILITY:  [facility]   PHYSICIAN:  Marily Memos, M.D.                 DATE OF BIRTH:  [birth year]   DATE OF PROCEDURE:  08/08/2003  DATE OF DISCHARGE:  08/14/2003                                 OPERATIVE REPORT   PREOPERATIVE DIAGNOSIS:  Degenerative arthritis, left knee.   POSTOPERATIVE DIAGNOSIS:  Degenerative arthristis, left knee.   OPERATION PERFORMED:  Left total knee arthroplasty.   SURGEON:  Marily Memos, M.D.   ANESTHESIA:  General.   DESCRIPTION OF PROCEDURE:  The patient was taken to the operating room after  given adequate preop medication and given general anesthesia and intubated.  The left lower lobe was prepped with DuraPrep and draped with sterile  manner.  Tourniquet and Bovie used for hemostasis.  Midline anterior  incision made on the left knee going through skin and subcutaneous tissue  from the tibial tuberosity all the way up to the quadriceps.  Blunt and  sharp dissection was made down to the capsule.  A medial paramedian incision  was made in the joint capsule extending from the tuberosity all the way up  the quadriceps.  Patella was with difficulty reflected laterally.  Osteophytes about the patella.  Femur and tibia were resected as well as  soft tissue resection.  After adequate soft tissue resection medial capsule  and collateral ligament release was done due to her varus deformity.  After  adequate soft tissue release, the knee was taken into a flexed position,  tibial cutting jig was put in place and 2 mm of tibial surface was resected.  Next, reaming was done down the femoral canal.  Distal femoral cutting jig  was put in place and the distal femoral cut was made.  Sizing of the femur  was then done which was found to be 65 mm.  The appropriate cutting jig   was  put in place.  Anterior and posterior cuts as well as chamfering cuts were  made.  Other osteophytes and the soft tissue resection was then done.  Femoral trial component was then put in place and found to be a very good  snug fit.  Next, attention was turned to the tibia.  Sizing of the tibia was  71 mm.  Alignment rod was used to center the tibial cutting jig.  With  tibial cutting jig put in place, appropriate cuts were made.  After which  both tibial and femoral trial components were put in place.  The knee was  brought out into full extension and full flexion with some laxity with a 10  mm trial.  A 12 mm 71 mm trial was put in place.  Full extension, full  flexion, good medial and  lateral stability.  With a good fit.  Attention was  then turned to the patella which was sized as a large patella.  Appropriate  cutting jig was put in place and appropriate cuts about the _________  patella was made.  With all three components put in place, knee was taken to  full flexion and extension, patella had some lateral subluxation, lateral  release was done which resulted in good seating of the patella without  lateral subluxation.  All components were then removed.  Irrigation was then  done with Simpulse.  Cement was then mixed.  The patellar and tibial  components were cemented.  The femoral component was press-fit.  Again range  of motion was found to be good with good medial and lateral stability and no  subluxation of the patella.  Patellar component was locked in  with the tibial bearing poly.  Tourniquet was let down.  Hemostasis  obtained.  Wound closure was then done with 0 Vicryl for the fascia, 2-0 for  the subcutaneous tissue and skin staples for the skin  Bulky compressive  dressing was applied.  The patient tolerated the procedure well and went to  recovery room in stable and satisfactory condition.                                               Marily Memos, M.D.     AC/MEDQ  D:  09/20/2003  T:  09/20/2003  Job:  [id number]

## 2010-06-14 NOTE — Op Note (Signed)
[place]. [facility]  Patient:    [name], [name] Visit Number: [id number] MRN: [id number]          Service Type: END Location: ENDO Attending Physician:  Juanita Craver Dictated by:   Sharmon Revere, M.D. Proc. Date: 10/06/00 Admit Date:  10/23/2000 Discharge Date: 10/23/2000                             Operative Report  PREOPERATIVE DIAGNOSES: 1. Internal derangement of the right knee. 2. Synovitis.  POSTOPERATIVE DIAGNOSES: 1. Chronic synovitis of medial and lateral compartment. 2. Chondral defect of the patella and medial femoral condyle. 3. Multiple loose bodies.  PROCEDURES: 1. Arthroscopic complete synovectomy. 2. Chondroplasty of the medial femoral condyle. 3. Removal of loose bodies.  SURGEON:  Sharmon Revere, M.D.  ANESTHESIA:  General.  DESCRIPTION OF PROCEDURE:  The patient was taken to the operating room after being given adequate preoperative medication.  She was given general anesthesia and intubated.  The right knee was prepped with DuraPrep and draped in a sterile manner.  A 1/2-inch puncture wound was made along the anterior, medial, and lateral joint lines.  Inflow was through the medial suprapatellar pouch area.  Inspection of the joint revealed hypertrophic plica, as well as synovial lining in both medial and lateral compartments, as well as in the suprapatellar pouch.  A complete synovectomy was done.  A large chondral defect with peeling away over about the medial femoral condyle approximately the size of a 25 cent piece.  Loose fragments were noted in the lateral recess.  These were removed.  Chondroplasty was done with smoothing and shaving the surface, roughening up the articular surface.  The ACL was still intact.  There was some fraying of the remnants of the medial menisci.  The lateral compartment and articular surface were well preserved.  Further copious abundant irrigation was done.  Would closure was then done  with 4-0 nylon.  Marcaine 0.5% was injected into the joint.  A compressive dressing was applied.  The patient tolerated the procedure quite well and went to the recovery room in stable and satisfactory condition.  The patient will be discharged home to continue her anti-inflammatory, as well as Percocet one q.4h. p.r.n. for pain, ice packs, and partial weightbearing on the right side.  She will return to the office in one week.  The patient was discharged in stable and satisfactory condition. Dictated by:   Sharmon Revere, M.D. Attending Physician:  Juanita Craver DD:  11/11/00 TD:  11/11/00 Job: [id number]

## 2010-06-14 NOTE — Cardiovascular Report (Signed)
NAME:  [name], [name]                            ACCOUNT NO.:  [id number]   MEDICAL RECORD NO.:  [id number]                   PATIENT TYPE:  OIB   LOCATION:  2857                                 FACILITY:  [facility]   PHYSICIAN:  Mohan N. Terrence Dupont, M.D.              DATE OF BIRTH:  1945/02/18   DATE OF PROCEDURE:  06/22/2003  DATE OF DISCHARGE:  06/22/2003                              CARDIAC CATHETERIZATION   PROCEDURE:  Left cardiac catheterization with selective left and right  coronary angiography and left ventriculography via the right groin using  Judkins technique.   INDICATIONS:  [name] is a 65 year old black female with past medical  history significant for rheumatoid arthritis, hypercholesterolemia,  hypothyroidism, morbid obesity, degenerative joint disease who complains of  vague retrosternal chest pain radiating to the right shoulder without  associated symptoms of nausea, vomiting or diaphoresis.  The patient also  gives history of exertional dyspnea associated with feeling weak and tired  and fatigued.  The patient underwent Persantine Cardiolite on May 9 for  preop clearance for the left total knee replacement on Jun 07, 2003 which  showed stress induced ischemia in the anterolateral apex with EF of 67%.  Due to chest pain, multiple risk factors, positive Persantine Cardiolite,  discussed with patient regarding left cath, possible percutaneous  transluminal coronary angioplasty and stenting, its risks and benefits and  consented for PCI.  No known drug allergies.   PROCEDURE:  After obtaining informed consent, the patient was brought to the  cath lab and was placed on fluoroscopy table. Right groin was prepped and  draped in usual fashion.  2% Xylocaine was used for local anesthesia in the  right groin.  With the help of thin-wall needle, a 6-French arterial sheath  was placed.  The sheath was aspirated and flushed.  Next, 6-French left  Judkins catheter was advanced  over the wire under fluoroscopic guidance up  to the ascending aorta.  Wire was pulled out and the catheter was aspirated  and connected to the manifold. Catheter was further advanced and engaged  into left coronary ostium.  Multiple views of the left system were taken.  Next, the catheter was disengaged and was pulled out over the wire and was  replaced with 6-French right Judkins catheter which was advanced over the  wire under fluoroscopic guidance to the ascending aorta.  Wire was pulled  out, the catheter was aspirated and connected to the manifold.  Catheter was  further advanced and engaged into right coronary ostium.  Multiple views of  the right system were taken.  Next, the catheter was disengaged and was  pulled out over the wire and was replaced with 6-French pigtail catheter  which was advanced over wire under fluoroscopic guidance up to the ascending  aorta.  Wire was pulled out, the catheter was aspirated and connected to the  manifold.  Catheter was further  advanced across the aortic valve into the  left ventricle.  Left ventricular pressures were recorded.  Next, left  ventriculography was done in 30-degree RAO position.  Post angiographic  pressures were recorded from LV and then pullback pressures were recorded  from the aorta.  There was no gradient across the aortic valve.  Next, the  pigtail catheter was pulled out over the wire, sheaths aspirated and  flushed.   FINDINGS:  The LV showed good LV systolic function.  EF of 55-65%.  Left  main was patent.  LAD has 10-15% proximal and mid stenosis.  Diagonal one  and two were small which were patent.  Left circumflex was large which was  patent.  OM-1 was large which was patent which bifurcates distally into  superior very small branch and inferior large branch.  The superior branch  has 50-60% ostial stenosis.  Vessel is less than 1.5 mm.  OM-2 and OM-3 are  very small which are patent.  RCA is patent.  The patient has  co-dominant  coronary system.  Arteriotomy was closed by Perclose at the end of the  procedure without complications.  The patient tolerated the procedure well  and was transferred to recovery room in stable condition.                                               Allegra Lai. Terrence Dupont, M.D.    MNH/MEDQ  D:  06/22/2003  T:  06/23/2003  Job:  [id number]   cc:   [name], M.D.  [place]  [place] [address]  Fax: [phone]

## 2010-06-14 NOTE — Op Note (Signed)
NAME:  [name], [name]                            ACCOUNT NO.:  [id number]   MEDICAL RECORD NO.:  [id number]                   PATIENT TYPE:  INP   LOCATION:  0005                                 FACILITY:  [facility]   PHYSICIAN:  Odis Hollingshead, M.D.            DATE OF BIRTH:  Sep 14, 1945   DATE OF PROCEDURE:  06/17/2002  DATE OF DISCHARGE:                                 OPERATIVE REPORT   PREOPERATIVE DIAGNOSIS:  Diverticulosis with colostomy.   POSTOPERATIVE DIAGNOSES:  1. Diverticulosis with colostomy.  2. Pelvic abscess with contained small bowel perforation.   PROCEDURES:  1. Exploratory laparotomy with lysis of adhesions.  2. Drainage of pelvic abscess and small bowel resection.   SURGEON:  Odis Hollingshead, M.D.   ASSISTANT:  Coralie Keens, M.D.   FINDINGS:  Multiple adhesions were noted.  When we were down in the pelvis,  there was a loop of small bowel densely adherent to the bladder, and upon  separating these two, there was an abscess cavity with pus present.  There  were also some pills outside of the small intestine into a small defect in  the bladder that was not full-thickness and thus not an enterovesical  fistula.  The rectal stump was deep in the pelvis.   INDICATION:  This 65 year old female had complicated diverticular disease  with a colovesical fistula and underwent a complicated operation with  colectomy, colostomy, small bowel resection, cholecystectomy, and repair of  bladder defect in July 2003.  She has completely recovered with that and  requests colostomy closure and now presents for that.  The procedure and the  risks were explained to her preoperatively.   TECHNIQUE:  She was seen in the holding area, then brought to the operating  room, placed supine on the operating table, and a general anesthetic was  administered.  A Foley catheter was placed in her bladder, and cloudy urine  was noted.  Her abdomen was then sterilely prepped and  draped.  The previous  long midline incision was re-incised sharply and with cautery.  Attenuated  fascia was noted.  Once into the abdominal cavity, the omental adhesions to  the anterior abdominal wall were taken down with cautery and sharp  dissection.  There were relatively few adhesions from the small intestine to  the abdominal wall.  However, there were multiple small bowel adhesions to  the omentum which we took down sharply.  We were able to expose the  colostomy site internally by taking down multiple adhesions of small  intestine.  We then began to take down adhesions in the pelvis and retract  the small intestine superiorly.  There was one loop of small intestine  densely adherent to the bladder.  We carefully took down the adhesions to  this and then entered an abscess cavity where purulent material was  evacuated.  There were also pills out through a  contained small bowel  perforation.  These were noted to be in a small defect in the bladder wall.  There was not full-thickness.  We removed these two pills and then freed up  the small intestine and noted two areas of possible perforation.  I also  noted the rectal stump which was fairly thin and deep into the pelvis.  Given the fact that there was an abscess cavity and then we needed to do a  small bowel resection, I did not feel it was safe to proceed with a true  colostomy closure.  Instead, I decided to do small bowel resection.   The area of affected small bowel appeared to be jejunum.  I performed a  small bowel resection by dividing the affected area with the stapler, then  performing a side-to-side anastomosis after the mesentery vessels had been  ligated.  The specimen was handed off the field and sent to pathology.  We  then closed the remaining enterotomy at the anastomosis with the TA 60  stapler.  The mesenteric defect was closed with interrupted 3-0 Vicryl  sutures, and the distal portion of the staple line was  reinforced with a  single 3-0 Vicryl suture.  The anastomosis was patent, viable, and under no  tension.   Gloves were then changed.  We copiously irrigated out the abdominal cavity.  The small little abscess cavity was partially into the perivesical fat, was  then noted and partially debrided.  I then placed a 19 Blake drain in the  abscess cavity and exited it out the right lower quadrant, anchoring it to  the skin with a 3-0 nylon suture.  I used approximately three liters of  irrigation to irrigate out the abdominal cavity.   I then replaced the small intestine back in the abdominal cavity and placed  the omentum over it.  Needle, sponge, and instrument counts were reported to  be correct.  I then closed the fascia with a running #1 PDS suture.  The  subcutaneous tissue was irrigated with antibiotic solution and the skin  closed with staples.  A stomal appliance was then applied.  A sterile  dressing was then applied to the midline wound.   She tolerated the procedure well without any apparent complications and was  taken to the recovery room in satisfactory condition.                                               Odis Hollingshead, M.D.    Katina Degree  D:  06/17/2002  T:  06/17/2002  Job:  [id number]   cc:   Allegra Lai. Terrence Dupont, M.D.  [address]  [place] [address]  Fax: [phone]   Lindaann Slough, M.D.   Nelwyn Salisbury, M.D.  [address]  [address] [place]  [place] [address]  Fax: Carrington. Dahlstedt, M.D.  [place]. [address] [place]  [place] [address]  Fax: [phone]

## 2010-06-14 NOTE — Op Note (Signed)
[facility]  Patient:    SHANDORA, KOOGLER Visit Number: [id number] MRN: [id number]          Service Type: SUR Location: 3W 3716 01 Attending Physician:  Erick Blinks Dictated by:   Odis Hollingshead, M.D. Proc. Date: 08/09/01 Admit Date:  08/09/2001   CC:         Lillette Boxer. Dahlstedt, M.D.  Nelwyn Salisbury, M.D.   Operative Report  PREOPERATIVE DIAGNOSES: 1. Colovesical fistula. 2. Cholelithiasis.  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 3. Chronic diverticulitis.  SURGEON:  Odis Hollingshead, M.D.  Lolly MustacheLillette Boxer. Dahlstedt, M.D.  ANESTHESIA:  General.  INDICATION:  [name] is a 65 year old female, who has been known to have pandiverticulosis.  She has had diverticulitis in the past as well.  She has had known cholelithiasis and gallbladder sludge and had some symptoms from this.  She began having pneumaturia and was evaluated by Dr. Diona Fanti. Findings were consistent with colovesical fistula, and now she presents for cholecystectomy, partial colectomy, and bladder repair.  The procedure and the risks were explained to her preoperatively in detail.  TECHNIQUE:  She was brought to the operating room, placed supine on the operating table, and a general anesthetic was administered.  Her abdomen was sterilely prepped and draped after a 16 French Foley was placed in the bladder.  A midline incision was made, incising the skin sharply.  The subcutaneous tissue, fascia, and peritoneum were divided with the cautery. Once entering the abdominal cavity, I could feel the firm, inflammatory mass in the pelvic area.  However, first I approached the right upper quadrant and gallbladder area.  Then adhesions between the omentum and gallbladder were taken down with the cautery.  This was done all the way down to the infundibulum.  I then grasped the fundus and dissected the gallbladder  free from the liver bed intact with the cautery.  I identified the cystic artery, clipped it, and divided it.  I then identified the cystic duct.  I ligated it with 2-0 Vicryl suture, then clipped it, then divided it, and removed the gallbladder from the field.  Bleeding from the liver bed was controlled with a cautery, and I placed a laparotomy pad up there.  Next, I approached the pelvis.  When the small bowel was mobilized, I noted an adherent section of small bowel to this inflammatory process which was very firm.  I subsequently ended up needing to perform a segmental small bowel resection and leave this piece of small bowel with the inflammatory mass.  I did this first by dividing an area proximal and distal to the adherent area with the Endo GIA stapler then dividing the mesentery and ligating the small vessels between clamps.  A side-to-side anastomosis was then performed with the stapler with the remaining enterotomy closed with a TA 60 stapler.  The small mesenteric defect was closed with 3-0 Vicryl suture.  The anastomosis was patent, viable, and under no tension.  Next, I mobilized the descending colon.  I chose an area to divide the descending colon at the descending colon - sigmoid colon junction, proximal to the inflammatory mass.  I then ligated the mesenteric vessels close to the colon and then divided them. I identified the left ureter and preserved this. I then dissected the anterior portion of the sigmoid colon from the bladder. There was a very dense, inflammatory reaction.  A small defect in the bladder was noted.  I then began by dissecting the lateral aspects of the severe inflamed sigmoid colon with the cautery and tying off small vessels.  I identified the right ureter and noted it coursing very close to the inflammatory mass but was able to separate this without harming it.  The posterior aspect of the area was fairly inflamed and adherent to the pelvic region.   I was able to free this up using the cautery and gentle blunt dissection.  I continued to do this very carefully for some time until I got down to a point distal to the rectosigmoid junction.  In fact, the proximal rectum was involved with this severe inflammatory response and reaction as was the ______most two-thirds of the sigmoid colon.  I subsequently divided the rectum with the stapler and handed the specimen off the field.  The specimen included the sigmoid colon, proximal rectum, and a small segment of small bowel.  I marked the rectal stump with two 2-0 Prolene sutures and cut the sutures long.  I then copiously irrigated out the abdominal cavity and controlled bleeding with cautery.  I further mobilized the descending colon in order to allow me to do a colostomy, as I felt the blood loss and the severe inflammatory reaction were contraindications to an anastomosis at this time.  Next, a circular incision was made just lateral to the umbilicus through skin and subcutaneous tissue.  A cruciate incision was made in the anterior rectus sheath.  The underlying rectus muscle was separated bluntly and the peritoneum and posterior rectus sheath incised with the cautery.  This was dilated to two fingers, and the colostomy stump was brought up through the defect.  The colon was then anchored to the abdominal wall internally with interrupted 2-0 Vicryl sutures.  I then again irrigated out the abdominal cavity and noted no bleeding.  I removed laparotomy pad from the gallbladder fossa, and there was no bleeding or bile leak.  The needle, sponge, and instrument counts were reported to be correct at this time.  The right and left ureters were reinspected and were not injured.  Dr. Diona Fanti repaired the bladder defect which will be dictated in a separate note.  Next, I placed the omentum over the intestines and closed the fascia with  running #1 PDS suture.  The subcutaneous tissue was  irrigated, and the skin was closed with staples.  I then matured the colostomy by incising the staple line and matured it with 3-0 Vicryl sutures.  An appliance was placed over the colostomy.  A dressing was then placed over the wound.  She tolerated the procedure well without any apparent complications. Estimated blood loss was approximately 750 cc.  She was then taken to the recovery room in satisfactory condition. Dictated by:   Odis Hollingshead, M.D. Attending Physician:  Erick Blinks DD:  08/09/01 TD:  08/11/01 Job: [id number]

## 2010-06-14 NOTE — Discharge Summary (Signed)
NAME:  [name], [name]                         ACCOUNT NO.:  [id number]   MEDICAL RECORD NO.:  [id number]                   PATIENT TYPE:  INP   LOCATION:  5598                                 FACILITY:  [facility]   PHYSICIAN:  Odis Hollingshead, M.D.            DATE OF BIRTH:  1945-10-02   DATE OF ADMISSION:  08/09/2001  DATE OF DISCHARGE:  08/19/2001                                 DISCHARGE SUMMARY   PRINCIPAL DISCHARGE DIAGNOSES:  Colovesical fistula.   SECONDARY DIAGNOSES:  1. Sigmoid diverticulitis.  2. Chronic calculus cholecystitis.  3. Rheumatoid arthritis.  4. Postoperative ileus.  5. Blood loss anemia.  6. Hypokalemia.   PROCEDURE:  Cholecystectomy, small bowel resection, sigmoid colectomy,  colostomy, repair of bladder (by Dr. Diona Fanti).   REASON FOR ADMISSION:  This 65 year old female has known pan diverticulosis.  She is noted to have pneumaturia and was seen by Dr. Diona Fanti.  Underwent  the flexible sigmoidoscopy and CT scan, both of which were consistent with  colovesical fistula.  She has been placed on chronic antibiotic therapy and  now was admitted for elective procedure.   HOSPITAL COURSE:  She underwent the above procedure as listed.  Postoperatively she had some acute blood loss anemia.  Her Synthroid was  restarted.  The largest problem we had was she had had significant swelling  and pain in her hands from her rheumatoid arthritis.  I discussed this with  Dr. Antony Haste and put her on some IV steroids.  She did have somewhat of  an ileus.  The rheumatoid arthritis improved.  The Foley was left in.  She  was given a seven day course of IV Cefotetan.  She did have a progressive  anemia and ended up having 2 units of blood given to her with good response.  She subsequently had her diet slowly advanced when her colostomy started  working.  She continued with low grade fever but this improved as well over  time.  A cystogram was eventually checked and the  Foley was removed.  Also  of note was she had a lower abdominal wound infection that was opened and  packed.  Her fever improved.  Her potassium was replaced and subsequently  she was able to be discharged in  satisfactory condition August 19, 2001.  The patient was discharged to home  August 19, 2001.  Home health nurses will help her with daily wound care and  colostomy care.  She is told to take her home medicines and Vicodin for  pain.  She will come back and see me in approximately one week.                                               Odis Hollingshead, M.D.  TJR/MEDQ  D:  09/14/2001  T:  09/14/2001  Job:  [id number]   cc:   Lillette Boxer. Dahlstedt, M.D.   Juanita Craver, M.D.   Lindaann Slough, M.D.  [address]  [place]  [place] [address]  Fax: 1

## 2010-06-14 NOTE — Discharge Summary (Signed)
NAMEJAKALYN, [name]                            ACCOUNT NO.:  [id number]   MEDICAL RECORD NO.:  [id number]                   PATIENT TYPE:  INP   LOCATION:  5029                                 FACILITY:  [facility]   PHYSICIAN:  Marily Memos, M.D.                 DATE OF BIRTH:  [birth year]   DATE OF ADMISSION:  08/08/2003  DATE OF DISCHARGE:  08/14/2003                           DISCHARGE SUMMARY - REFERRING   ADMISSION DIAGNOSIS:  1. History of rheumatoid arthritis.  2. High blood pressure.  3. Colostomy.   DISCHARGE DIAGNOSIS:  1. History of rheumatoid arthritis.  2. High blood pressure.  3. Colostomy.   COMPLICATIONS:  None.   INFECTIONS:  None.   OPERATION:  Left total knee arthroplasty done on August 08, 2003.   HISTORY OF PRESENT ILLNESS:  This is a 65 year old black female who had been  followed in the office for rheumatoid polyarthritis, bilateral knees.  The  patient had been treated with anti-inflammatories, as well as rheumatoid  medications with progressive worsening of both knees, left greater than  right.  The patient had previously been scheduled but had to be rescheduled  for this operation due to medical evaluation.  The patient was seen by Dr.  Terrence Dupont and found to be clear for surgery.   PHYSICAL EXAMINATION:  Left knee range of motion is limited.  Crepitus in  all three compartments, +2 effusion.  Varus deformity.  Tender to palpation.  Negative Homan's test.   LABORATORY DATA:  The patient had preoperative CMET, UA, EKG, chest x-ray  which were found to be stable enough for the patient to undergo surgery.   The patient underwent left total knee arthroplasty quite well.   POSTOPERATIVE HOSPITAL COURSE:  The patient did have flare-up of her  bronchitis with greenish phlegm.  The patient was kept on CPM machine for  range of motion of the knee, spirometry to keep her lungs clear.  The  patient's bronchitis cleared up.  She progressed quite well with  physical  therapy and progressed with ambulation and partial weightbearing on the left  side well enough to be discharged home.  The patient discharged home with  Percocet one q.4h. p.r.n. for pain.  Continued on Coumadin.  Home health  nurse as well as therapy, continue ambulation.  The patient to return to the  office in 10-day period.   The patient discharged in stable and satisfactory condition.                                                Marily Memos, M.D.    AC/MEDQ  D:  09/20/2003  T:  09/20/2003  Job:  [id number]

## 2010-06-14 NOTE — Procedures (Signed)
[place]  Patient:    [name], [name] Visit Number: [id number] MRN: [id number]          Service Type: END Location: ENDO Attending Physician:  Juanita Craver Dictated by:   Nelwyn Salisbury, M.D. Proc. Date: 10/23/00 Admit Date:  10/23/2000 Discharge Date: 10/23/2000   CC:         Prudencio Burly N. Terrence Dupont, M.D.   Procedure Report  DATE OF BIRTH:  1945-07-07  PROCEDURE PERFORMED:  Colonoscopy.  ENDOSCOPIST:  Nelwyn Salisbury, M.D.  INSTRUMENT USED:  Olympus videocolonoscope.  INDICATION FOR PROCEDURE:  A 65 year old female with a previous history of colonic polyps and recurrent rectal bleeding, rule out colonic polyps, masses, hemorrhoids, etc.  PREPROCEDURE PREPARATION:  Informed consent was secured from the patient.  The patient fasted for eight hours prior to the procedure and prepped with a bottle of magnesium citrate and gallon of NuLytely the night prior to the procedure.  PREPROCEDURE PHYSICAL:  VITAL SIGNS:  Stable.  NECK:  Supple.  CHEST:  Clear to auscultation, S1, S2, regular.  ABDOMEN:  Soft with normal bowel sounds.  DESCRIPTION OF PROCEDURE:  The patient was placed in the left lateral decubitus position and sedated with Demerol 75 mg and Versed 7.5 mg intravenously.  Once the patient was adequately sedated and maintained on low-flow oxygen and continuous cardiac monitoring, the Olympus videocolonoscope is advanced from the rectum to the cecum with difficulty secondary to a large amount of residual stool in the colon.  The patient had evidence of pandiverticulosis with prominent internal hemorrhoids.  There was a large amount of stool in the right colon and transverse colon.  The cecum was reached but small lesions could have been missed.  The patient tolerated the procedure well without complication.  IMPRESSION: 1. Pandiverticulosis with prominent internal hemorrhoid. 2. Large amount of residual stool in the colon  - small lesions could have been    missed.  RECOMMENDATIONS: 1. Anusol HC 2.5% suppositories have been prescribed for the patient #30. 2. A high fiber diet has been discussed in great detail. 3. Outpatient follow-up advised within the next four weeks or earlier if need    be. Dictated by:   Nelwyn Salisbury, M.D. Attending Physician:  Juanita Craver DD:  10/26/00 TD:  10/26/00 Job: [id number]

## 2010-06-14 NOTE — H&P (Signed)
[place]. [facility]  Patient:    [name], [name] Visit Number: [id number] MRN: [id number]          Service Type: END Location: ENDO Attending Physician:  Juanita Craver Dictated by:   Sharmon Revere, M.D. Admit Date:  10/23/2000 Discharge Date: 10/23/2000                           History and Physical  CHIEF COMPLAINT:  Painful, swollen right knee.  HISTORY OF PRESENT ILLNESS:  This is a 65 year old female who has been followed in the office for synovitis and internal derangement of the right knee over the past several months.  The patient has been on anti-inflammatories.  She had therapeutic injections with more recently increased buckling and giving out of the right knee.  PAST MEDICAL HISTORY:  Hysterectomy in the past.  Bilateral knee arthroscopies in the past.  Hyperlipidemia.  Thyroid disorder.  Heart disease.  ALLERGIES:  None.  MEDICATIONS:  Prednisone, Allegra, Lipitor, Synthroid, nitroglycerin, and extra strength Vicodin.  HABITS:  The patient drinks a six-pack per week.  FAMILY HISTORY:  Noncontributory.  REVIEW OF SYSTEMS:  Basically as that in the history of present illness.  No cardiac or respiratory.  No urinary or bowel symptoms.  PHYSICAL EXAMINATION:  Temperature 99.8 degrees, pulse 88, respirations 16, blood pressure 130/70.  HEIGHT:  63 inches.  WEIGHT:  246 pounds.  GENERAL APPEARANCE:  Obese.  Alert and oriented.  In no acute distress.  HEENT:  Normocephalic.  Eyes:  Conjunctivae and sclerae clear.  NECK:  Supple.  CHEST:  Clear.  CARDIAC:  S1 and S2 regular.  EXTREMITIES:  The right knee has old surgical scars anteriorly and tender medially and laterally.  A palpable click and positive McMurrays test.  There was +2 effusion.  Negative drawer.  Negative Lachman test.  IMPRESSION: 1. Internal derangement of the right knee. 2. Synovitis of the right knee. Dictated by:   Sharmon Revere, M.D. Attending  Physician:  Juanita Craver DD:  11/11/00 TD:  11/11/00 Job: [id number]

## 2010-06-14 NOTE — H&P (Signed)
[place]. [facility]  Patient:    [name], [name] Visit Number: [id number] MRN: [id number]          Service Type: END Location: ENDO Attending Physician:  Juanita Craver Dictated by:   Sharmon Revere, M.D. Admit Date:  10/23/2000 Discharge Date: 10/23/2000                           History and Physical  CHIEF COMPLAINT:  Painful right knee.  HISTORY OF PRESENT ILLNESS:  This is a 65 year old who has been treated for internal derangement over the past year for the right knee with recurrent pain, swelling, and buckling of the right knee.  The patients condition just simply progressively got worse over the past few weeks.  PAST SURGICAL HISTORY:  Hysterectomy, left knee arthroscopy, as well as right knee arthroscope.  PAST MEDICAL HISTORY:  History of thyroid disorder, hyperlipidemia, and heart disease.  ALLERGIES:  None.  MEDICATIONS:  Prednisone, Allegra, Lipitor, Synthroid, nitroglycerin, and Vicodin Extra Strength.  HABITS:  Alcohol:  A six-pack per week.  FAMILY HISTORY:  Noncontributory.  REVIEW OF SYSTEMS:  Basically as in the history of present illness.  No cardiac or respiratory symptoms.  No urinary or bowel symptoms.  PHYSICAL EXAMINATION:  Temperature 98 degrees, pulse 88, respirations 16, blood pressure 130/70.  HEIGHT:  63 inches.  WEIGHT:  246 pounds.  HEENT:  Head:  Normocephalic.  Eyes:  Conjunctivae and sclerae clear.  NECK:  Supple.  CHEST:  Clear.  CARDIAC:  S1 and S2 regular.  EXTREMITIES:  Right knee +2 effusion.  Positive for Murrays test with palpable and audible click in the medial compartment and genu varum.  Range of motion lacks full extension.  Negative drawer.  Negative Lachman test.  IMPRESSION: 1. Internal derangement of the right knee. 2. Synovitis of the right knee. Dictated by:   Sharmon Revere, M.D. Attending Physician:  Juanita Craver DD:  11/04/00 TD:  11/04/00 Job: [id number]

## 2010-06-14 NOTE — Op Note (Signed)
[facility]  Patient:    [name], [name] Visit Number: [id number] MRN: [id number]          Service Type: SUR Location: 3W 2500 01 Attending Physician:  Erick Blinks Dictated by:   Lillette Boxer. Dahlstedt, M.D. Proc. Date: 08/09/01 Admit Date:  08/09/2001   CC:         Odis Hollingshead, M.D.   Operative Report  PREOPERATIVE DIAGNOSES: 1. Colovesical fistula. 2. Severe sigmoid diverticulitis.  PRINCIPAL PROCEDURE:  Right ureterolysis, closure of bladder.  SURGEON:  Lillette Boxer. Dahlstedt, M.D.  FIRST ASSISTANT:  Odis Hollingshead, M.D.  ANESTHESIA:  General endotracheal.  COMPLICATIONS:  None.  BRIEF HISTORY:  This 65 year old female, has severe diverticulitis and a recently discovered colovesical fistula.  She presents at this time for a sigmoid colon resection and cystorrhaphy.  Dr. Zella Richer had preopped the patient from the standpoint of her colon surgery.  The patient is aware of the possible need for long-term catheter drainage.  She is also aware of risks and complications of cystorrhaphy.  DESCRIPTION OF PROCEDURE:  Incision and opening of the abdomen as well as sigmoid colectomy and colostomy will be dictated separately by Dr. Zella Richer. During the procedure, it was quite evident that this diseased colon was quite near and adherent to both distal ureters.  The left distal ureter was quite easily freed from the surrounding inflammatory process.  The right ureter distally was quite caught up in this inflammatory process of the pelvic wall. Ureterolysis was performed proximally and moving distally towards the bladder. The dissection was carried distally enough so that we got by the significant inflammatory process.  The ureter was not felt to have been injured during this procedure.  A stent was not placed during the procedure, as I felt we were far enough away from the ureter when dissecting out the sigmoid colon.  After the  sigmoid colon had been resected, a small bladder defect was evident. This was closed with a single 3-0 Vicryl placed in a figure-of-eight fashion. The 46 French Foley catheter had been placed preoperatively.  This was switched to a 36 [place] catheter postoperatively and hooked to dependent drainage.  The closure of the wound and colostomy formation will be detailed separately by Dr. Zella Richer. Dictated by:   Lillette Boxer. Dahlstedt, M.D. Attending Physician:  Erick Blinks DD:  08/10/01 TD:  08/13/01 Job: [id number]

## 2010-06-14 NOTE — H&P (Signed)
NAMEANELLA, NAKATA NO.:  [id number]   MEDICAL RECORD NO.:  [id number]                   PATIENT TYPE:  INP   LOCATION:  5029                                 FACILITY:  [facility]   PHYSICIAN:  Marily Memos, M.D.                 DATE OF BIRTH:  1945-06-23   DATE OF ADMISSION:  08/08/2003  DATE OF DISCHARGE:  08/14/2003                                HISTORY & PHYSICAL   CHIEF COMPLAINT:  Painful left knee.   HISTORY OF PRESENT ILLNESS:  This is a 65 year old female who had been  followed over the past years with progressive worsening degenerative joint  disease in both knees, left being worse than the right.  The patient's pain  persists both at rest as well as with ambulation with increased difficulty  in getting up from the standing position, giving way of the knee when  walking.   ALLERGIES:  No known drug allergies.   MEDICATIONS:  1. Folic acid 1 mg daily.  2. Lasix 20 mg daily.  3. Synthroid 0.5 mcg daily.  4. Sulindec 200 mg b.i.d.  5. Methotrexate sodium.  6. Lipitor.  7. Percocet.  8. Aspirin 81 mg daily.  9. Darvocet-N p.r.n.  10.      Prevacid 10.  11.      Toprol XL 25 mg.   PAST MEDICAL HISTORY:  1. Colostomy two years ago.  2. Hysterectomy.  3. Arthroscopy right knee.  4. Arthroscopy left knee.  5. Arthroscopy right elbow.  6. History of hypercholesterolemia.  7. Rheumatoid disease.  8. High blood pressure.  9. Thyroid disease.  10.      Bronchitis.  11.      Heart disease with cardiac enlargement.  12.      History of ischemia.   SOCIAL HISTORY:  The patient quit the use of alcohol two years ago as well  as quit smoking two years ago.  The patient had been two-pack-per-day.   FAMILY HISTORY:  Noncontributory.   REVIEW OF SYMPTOMS:  No shortness of breath, non-productive cough.  Stress  induced ischemia.  Ischemic chest pain.  The patient does have a colostomy,  otherwise rheumatoid joints involving both joints,  particularly the right  knee.   PHYSICAL EXAMINATION:  VITAL SIGNS:  Temperature 98.1, pulse 88, respiratory  rate 20, blood pressure 170/91. Height 5 feet, 2 inches.  Weight 299.  HEENT:  Head normocephalic ______________.  CHEST:  Clear to auscultation.  CARDIOVASCULAR:  S1 and S2 regular.  ABDOMEN:  Obese.  EXTREMITIES:  Genu varum bilateral knees.  Left knee range of motion is  limited in full extension and flexion.  Crepitus at all three compartments,  +2 effusion, old previous surgical scar.  Right knee range of motion is  limited.  Varus deformity ____________ medial compartment.  Patellofemoral  joint crepitus.  Negative Homan's test bilaterally.   IMPRESSION:  Rheumatoid arthritis bilateral knees.   PLAN:  Left total knee arthroplasty.                                                Marily Memos, M.D.    AC/MEDQ  D:  09/20/2003  T:  09/20/2003  Job:  [id number]

## 2010-06-14 NOTE — H&P (Signed)
[name], ALPER                            ACCOUNT NO.:  [id number]   MEDICAL RECORD NO.:  [id number]                   PATIENT TYPE:  INP   LOCATION:  0005                                 FACILITY:  [facility]   PHYSICIAN:  Odis Hollingshead, M.D.            DATE OF BIRTH:  1945/07/03   DATE OF ADMISSION:  06/17/2002  DATE OF DISCHARGE:                                HISTORY & PHYSICAL   REASON FOR ADMISSION:  Colostomy closure.   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  She underwent  partial colectomy, small-bowel resection, cholecystectomy, colostomy, and  bladder repair back in July 2003.  She is admitted now for elective  colostomy closure.   PAST MEDICAL HISTORY:  1. Complicated diverticulitis, as above.  2. Rheumatoid arthritis.  3. Chronic bronchitis.  4. Gout.  5. Hypercholesterolemia.  6. Cholelithiasis, status post cholecystectomy.  7. Hypothyroidism.   PREVIOUS OPERATIONS:  1. Tonsillectomy.  2. Abdominal hysterectomy.  3. Knee surgery.  4. Partial colectomy with colostomy, small-bowel resection, and     cholecystectomy.   ALLERGIES:  No known drug allergies.   CURRENT MEDICATIONS:  1. Centrum Silver daily.  2. Vicodin p.r.n. right shoulder pain.  3. Lipitor 10 mg daily.  4. Sulindac 20 mg daily.  5. Methotrexate 2.9 mg, 9 tablets every Friday.  6. Synthroid 0.05 mg daily.  7. Folic acid 1 mg daily.  8. Iron supplement daily.   SOCIAL HISTORY:  She has a 30-pack-year history of smoking and quit one year  ago.  No alcohol use.   REVIEW OF SYSTEMS:  CARDIAC:  No ischemic heart disease noted.  She does  have some angina but, by a study in June 2003, no significant vascular  disease.  PULMONARY:  Does have some shortness of breath with exertion.  GASTROINTESTINAL:  Has the colostomy and some irritation around the  colostomy site.  GENITOURINARY:  Has had  intermittent urinary tract  infections.  MUSCULOSKELETAL:  Has rheumatoid arthritis right shoulder and  hands.   PHYSICAL EXAMINATION:  GENERAL:  Obese female.  VITAL SIGNS:  Weight 257 pounds, 5 feet 3 inches tall.  Temperature is 99.1,  blood pressure is 110/60, pulse 84 and regular.  NECK:  Supple.  Without palpable masses or obvious thyroid enlargement.  CARDIOVASCULAR:  Heart demonstrates a regular rate and rhythm.  LUNGS:  Breath sounds equal and clear.  Respirations unlabored.  ABDOMEN:  Soft, obese, with a long midline scar present and a left lower  quadrant stoma.  No obvious masses.  No obvious hernias present.  EXTREMITIES:  Some deformities of the hands.  Trace edema.   LABORATORY DATA:  Barium enema demonstrates pandiverticulosis and a short  rectal stump.   IMPRESSION:  Colostomy with some diverticulosis.  She would like to have  colostomy closure.  PLAN:  Will plan to perform colostomy closure.  She does realize there could  be diverticular disease left behind, and there is a small chance of having  recurrence of the disease, but I do not think she would tolerate an ileal-to-  midrectal anastomosis.  We went over the procedure and risks including the  inability to close the colostomy.                                               Odis Hollingshead, M.D.    Katina Degree  D:  06/17/2002  T:  06/17/2002  Job:  [id number]

## 2010-06-14 NOTE — Discharge Summary (Signed)
NAMECHANTEA, [name]                            ACCOUNT NO.:  [id number]   MEDICAL RECORD NO.:  [id number]                   PATIENT TYPE:  INP   LOCATION:  5883                                 FACILITY:  [facility]   PHYSICIAN:  Odis Hollingshead, M.D.            DATE OF BIRTH:  11/06/1945   DATE OF ADMISSION:  06/17/2002  DATE OF DISCHARGE:  06/28/2002                                 DISCHARGE SUMMARY   PRINCIPAL DISCHARGE DIAGNOSIS:  Contained small bowel perforation with  pelvic abscess.   SECONDARY DIAGNOSES:  1. Colostomy.  2. Diverticulosis.  3. Rheumatoid arthritis.  4. Chronic bronchitis.  5. Gout.  6. Hypercholesterolemia.  7. Hypothyroidism.   PROCEDURE:  Exploratory laparotomy, lysis of adhesions, drainage of intra-  abdominal/pelvic abscess, small bowel resection 06/17/2002.   REASON FOR ADMISSION:  The patient is a 65 year old female who had  complicated diverticulitis requiring a partial colectomy and colostomy.  She  was admitted with plans for a colostomy reversal.   HOSPITAL COURSE:  Intraoperative findings were as described above in the  procedure and postoperative diagnosis, requiring the colostomy to be left.  Also noted intraoperatively was a very thin-walled rectal stump, and I  doubted that it would hold any sutures.  Postoperatively, she had an NG tube  in and was maintained on cefoxitin.  She did have a slightly prolonged  ileus, but we were able to give her an NG tube clamping trial and by her  seventh postoperative day started her on a liquid diet.  Because the pelvic  abscess was densely adherent to the bladder, I left her Foley in and did a  cystogram on her seventh postoperative day which was read on postoperative  day eight and did not demonstrate any leak.  Subsequently, her Foley was  removed.  Her diet was advanced.  Her wound was healing fairly well.  Her  colostomy began functioning.  Of note was that she was concerned about  financial issues  related to her being able to continue to afford colostomy  supplies.  The [employer] representative was contacted.  She wears a H8631  type appliance.  They stated they would see what they could do and contact  us, but we have yet to hear from them at this time.  On 06/28/2002, she was  tolerating a diet, was stable, and was ready for discharge.   DISPOSITION:  Discharged home on 06/28/2002 in satisfactory condition.   DISCHARGE MEDICATIONS:  She will continue on all of her home medications,  and I gave her Vicodin for pain.   ACTIVITY:  Activity restrictions were given to her.    FOLLOW UP:  I will see her back in the office in two weeks.  We will  continue to work with her trying to get her colostomy supplies.  Odis Hollingshead, M.D.    Katina Degree  D:  06/28/2002  T:  06/28/2002  Job:  [id number]   cc:   Allegra Lai. Terrence Dupont, M.D.  [address]  [place] [address]  Fax: [phone]   Lindaann Slough, M.D.

## 2010-11-08 LAB — COMPREHENSIVE METABOLIC PANEL
ALT: 13
AST: 16
Alkaline Phosphatase: 75
CO2: 25
Calcium: 10.2
GFR calc Af Amer: >60
GFR calc non Af Amer: >60
Glucose, Bld: 94
Potassium: 4.2
Sodium: 143

## 2010-11-08 LAB — ABO/RH: ABO/RH(D): B POS

## 2010-11-08 LAB — CBC
HCT: 24.3 — ABNORMAL LOW
HCT: 26.6 — ABNORMAL LOW
Hemoglobin: 8.1 — ABNORMAL LOW
Hemoglobin: 8.7 — ABNORMAL LOW
Hemoglobin: 11.1 — ABNORMAL LOW
MCHC: 32.9
MCHC: 33.3
MCHC: 33.3
MCV: 86.9
MCV: 87
RBC: 2.79 — ABNORMAL LOW
RBC: 3.06 — ABNORMAL LOW
RBC: 3.9
RDW: 14.6 — ABNORMAL HIGH
WBC: 5.9
WBC: 6

## 2010-11-08 LAB — URINALYSIS, ROUTINE W REFLEX MICROSCOPIC
Protein, ur: NEGATIVE
Specific Gravity, Urine: 1.017
Urobilinogen, UA: 0.2

## 2010-11-08 LAB — PROTIME-INR
INR: 1.5
INR: 1.6 — ABNORMAL HIGH
Prothrombin Time: 15.7 — ABNORMAL HIGH
Prothrombin Time: 19.4 — ABNORMAL HIGH

## 2010-11-08 LAB — TYPE AND SCREEN
ABO/RH(D): B POS
Antibody Screen: NEGATIVE

## 2010-12-16 ENCOUNTER — Other Ambulatory Visit: Payer: Self-pay | Admitting: Ophthalmology

## 2010-12-16 NOTE — H&P (Signed)
  Pre-operative History and Physical for Ophthalmic Surgery  [name] 12/16/2010                  Chief Complaint: decreased vision left eye  Diagnosis: Combined Cataract Left Eye Allergies not on file  no   Planned Procedure: Phacoemulsification, Posterior Chamber Intra-ocular Lens  There were no vitals filed for this visit.  Pulse: 80, irreg irreg          Resp:  18       ROS:  non-contributory  No past medical history on file.  No past surgical history on file.   History   Social History  . Marital Status: Single    Spouse Name: N/A    Number of Children: N/A  . Years of Education: N/A   Occupational History  . Not on file.   Social History Main Topics  . Smoking status: Not on file  . Smokeless tobacco: Not on file  . Alcohol Use: Not on file  . Drug Use: Not on file  . Sexually Active: Not on file   Other Topics Concern  . Not on file   Social History Narrative  . No narrative on file     The following examination is for anesthesia clearance for minimally invasive Ophthalmic surgery. It is primarily to document heart and lung findings and is not intended to elucidate unknown general medical conditions inclusive of abdominal masses, lung lesions, etc.   General Constitution:  stable   Alertness/Orientation:  Person, time place     yes   HEENT:  Eye Findings:  Combined Cataract   left eye  Neck: supple without masses    Chest/Lungs: clear to auscultation  Cardiac: Normal S1 and S2 with SEM  Murmur, irregularly irregular   Neuro: non-focal   Other pertinent findings: none  Impression:  Combined Cataract  OS  Planned Procedure: Phacoemulsification with Posterior Chamber Intraocular Lens     [name], MD

## 2010-12-23 ENCOUNTER — Encounter (HOSPITAL_COMMUNITY): Payer: Self-pay | Admitting: Pharmacy Technician

## 2010-12-23 NOTE — H&P (Signed)
  Pre-operative History and Physical for Ophthalmic Surgery  [name] 12/16/2010             Chief Complaint: decreased vision left eye Diagnosis: Cataract Left Eye No Known Allergies    Planned Procedure:  Phacoemulsification, Posterior Chamber Intra-ocular Lens  There were no vitals filed for this visit.  Pulse: 70             Resp: 17    ROS: non-contributory No past medical history on file.  No past surgical history on file.   History   Social History  . Marital Status: Single    Spouse Name: N/A    Number of Children: N/A  . Years of Education: N/A   Occupational History  . Not on file.   Social History Main Topics  . Smoking status: Not on file  . Smokeless tobacco: Not on file  . Alcohol Use: Not on file  . Drug Use: Not on file  . Sexually Active: Not on file   Other Topics Concern  . Not on file   Social History Narrative  . No narrative on file     The following examination is for anesthesia clearance for minimally invasive Ophthalmic surgery. It is primarily to document heart and lung findings and is not intended to elucidate unknown general medical conditions inclusive of abdominal masses, lung lesions, etc.   General Constitution: WNL Alertness/Orientation:  Person, time place     yes   HEENT:  Eye Findings: Visually significant Cataract OS, otehrwise WNL     Neck: supple without masses   Chest/Lungs: clear to auscultation   Cardiac: Normal S1 and S2 without Murmur, S3 or S4   Neuro: non-focal    Impression: Cataract Left Eye  Planned Procedure: Phacoemulsification, Posterior Chamber Intraocular Lens     [name], MD

## 2010-12-28 ENCOUNTER — Encounter: Payer: Self-pay | Admitting: *Deleted

## 2010-12-28 ENCOUNTER — Emergency Department (HOSPITAL_COMMUNITY)
Admission: EM | Admit: 2010-12-28 | Discharge: 2010-12-29 | Disposition: A | Discharge status: Home / Self Care | Payer: Medicaid Other | Attending: Emergency Medicine | Admitting: Emergency Medicine

## 2010-12-28 DIAGNOSIS — M129 Arthropathy, unspecified: Secondary | ICD-10-CM | POA: Insufficient documentation

## 2010-12-28 DIAGNOSIS — E669 Obesity, unspecified: Secondary | ICD-10-CM | POA: Insufficient documentation

## 2010-12-28 DIAGNOSIS — I2510 Atherosclerotic heart disease of native coronary artery without angina pectoris: Secondary | ICD-10-CM | POA: Insufficient documentation

## 2010-12-28 DIAGNOSIS — M545 Low back pain, unspecified: Secondary | ICD-10-CM | POA: Insufficient documentation

## 2010-12-28 DIAGNOSIS — R7989 Other specified abnormal findings of blood chemistry: Secondary | ICD-10-CM

## 2010-12-28 DIAGNOSIS — R799 Abnormal finding of blood chemistry, unspecified: Secondary | ICD-10-CM | POA: Insufficient documentation

## 2010-12-28 DIAGNOSIS — E119 Type 2 diabetes mellitus without complications: Secondary | ICD-10-CM | POA: Insufficient documentation

## 2010-12-28 DIAGNOSIS — M549 Dorsalgia, unspecified: Secondary | ICD-10-CM

## 2010-12-28 DIAGNOSIS — Z79899 Other long term (current) drug therapy: Secondary | ICD-10-CM | POA: Insufficient documentation

## 2010-12-28 HISTORY — DX: Diverticulosis of intestine, part unspecified, without perforation or abscess without bleeding: K57.90

## 2010-12-28 HISTORY — DX: Unspecified osteoarthritis, unspecified site: M19.90

## 2010-12-28 HISTORY — DX: Encounter for attention to colostomy: Z43.3

## 2010-12-28 HISTORY — DX: Atherosclerotic heart disease of native coronary artery without angina pectoris: I25.10

## 2010-12-28 LAB — URINALYSIS, ROUTINE W REFLEX MICROSCOPIC
Hgb urine dipstick: NEGATIVE
Ketones, ur: NEGATIVE mg/dL
Protein, ur: NEGATIVE mg/dL
Urobilinogen, UA: 1 mg/dL (ref 0.0–1.0)

## 2010-12-28 LAB — URINE MICROSCOPIC-ADD ON

## 2010-12-28 NOTE — ED Notes (Signed)
Pt took Ibuprofin with out relief. Pt reports back pain when she sits up for a long time

## 2010-12-29 ENCOUNTER — Emergency Department (HOSPITAL_COMMUNITY): Payer: Medicaid Other

## 2010-12-29 LAB — CBC
HCT: 30.4 % — ABNORMAL LOW (ref 36.0–46.0)
Hemoglobin: 9.5 g/dL — ABNORMAL LOW (ref 12.0–15.0)
MCHC: 31.3 g/dL (ref 30.0–36.0)
RBC: 3.22 MIL/uL — ABNORMAL LOW (ref 3.87–5.11)

## 2010-12-29 LAB — POCT I-STAT, CHEM 8
BUN: 33 mg/dL — ABNORMAL HIGH (ref 6–23)
Calcium, Ion: 1.28 mmol/L (ref 1.12–1.32)
Chloride: 109 mEq/L (ref 96–112)
Creatinine, Ser: 1.4 mg/dL — ABNORMAL HIGH (ref 0.50–1.10)
Glucose, Bld: 106 mg/dL — ABNORMAL HIGH (ref 70–99)
HCT: 30 % — ABNORMAL LOW (ref 36.0–46.0)
Potassium: 4 mEq/L (ref 3.5–5.1)

## 2010-12-29 LAB — DIFFERENTIAL
Basophils Relative: 0 % (ref 0–1)
Lymphs Abs: 1.6 10*3/uL (ref 0.7–4.0)
Monocytes Absolute: 0.7 10*3/uL (ref 0.1–1.0)
Monocytes Relative: 7 % (ref 3–12)
Neutro Abs: 7.4 10*3/uL (ref 1.7–7.7)
Neutrophils Relative %: 74 % (ref 43–77)

## 2010-12-29 IMAGING — CR DG LUMBAR SPINE COMPLETE 4+V
5 series · 5 of 5 positions shown · non-contrast
Comparison: CT of the abdomen and pelvis performed [DATE], and
lumbar spine radiographs performed [DATE]

CLINICAL DATA: Lower back pain for 1 week.

LUMBAR SPINE - COMPLETE 4+ VIEW

[t l-spine a.p. *]
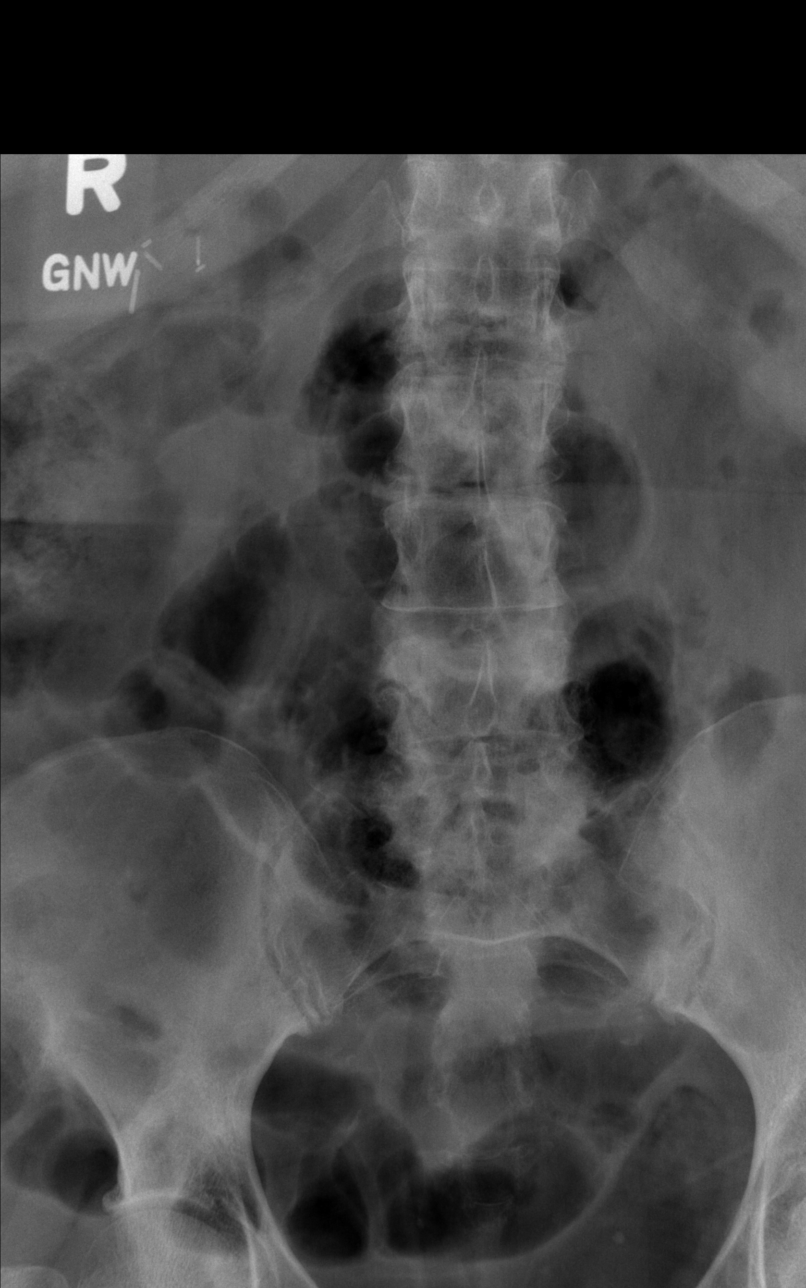

[t l-spine oblique exposure * (1 of 2)]
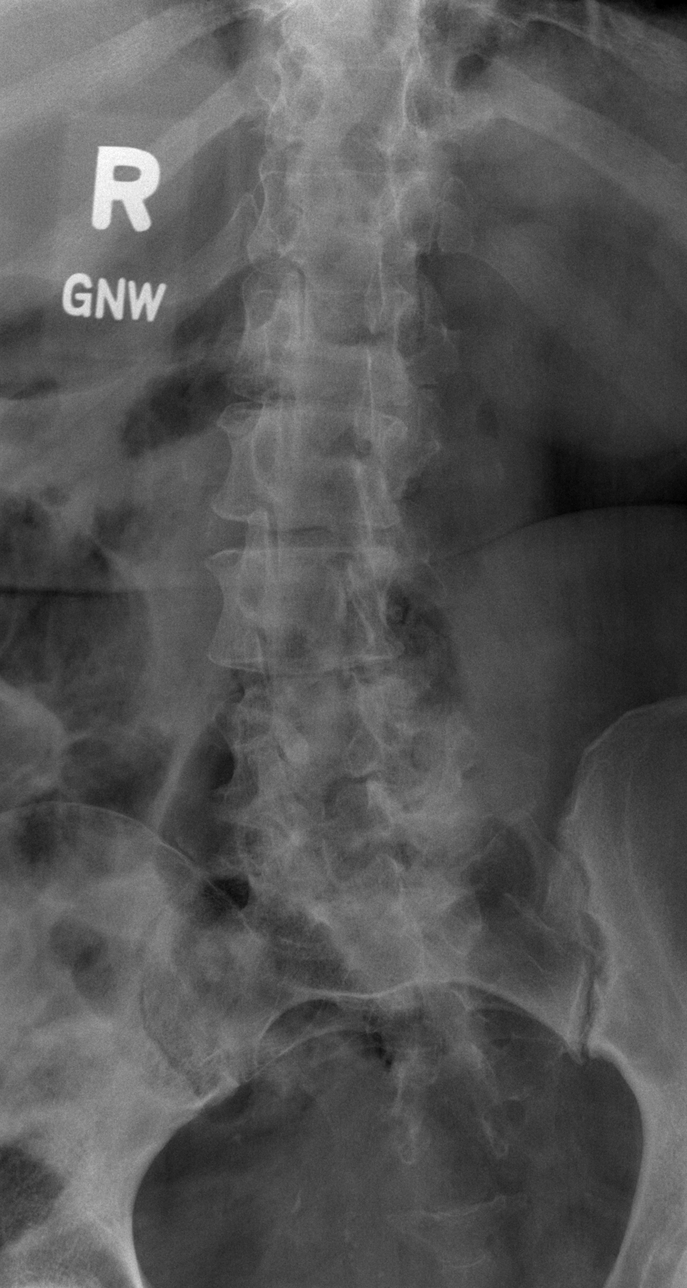

[t l-spine oblique exposure * (2 of 2)]
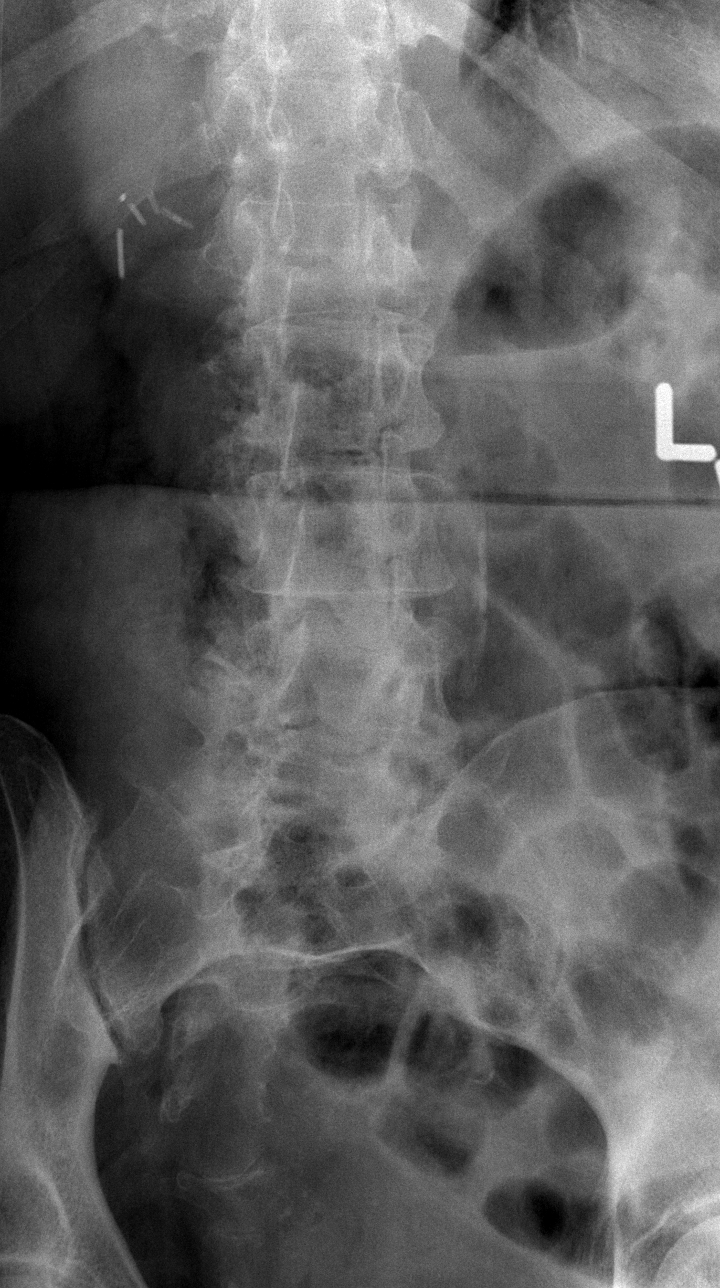

[t l-spine lat *]
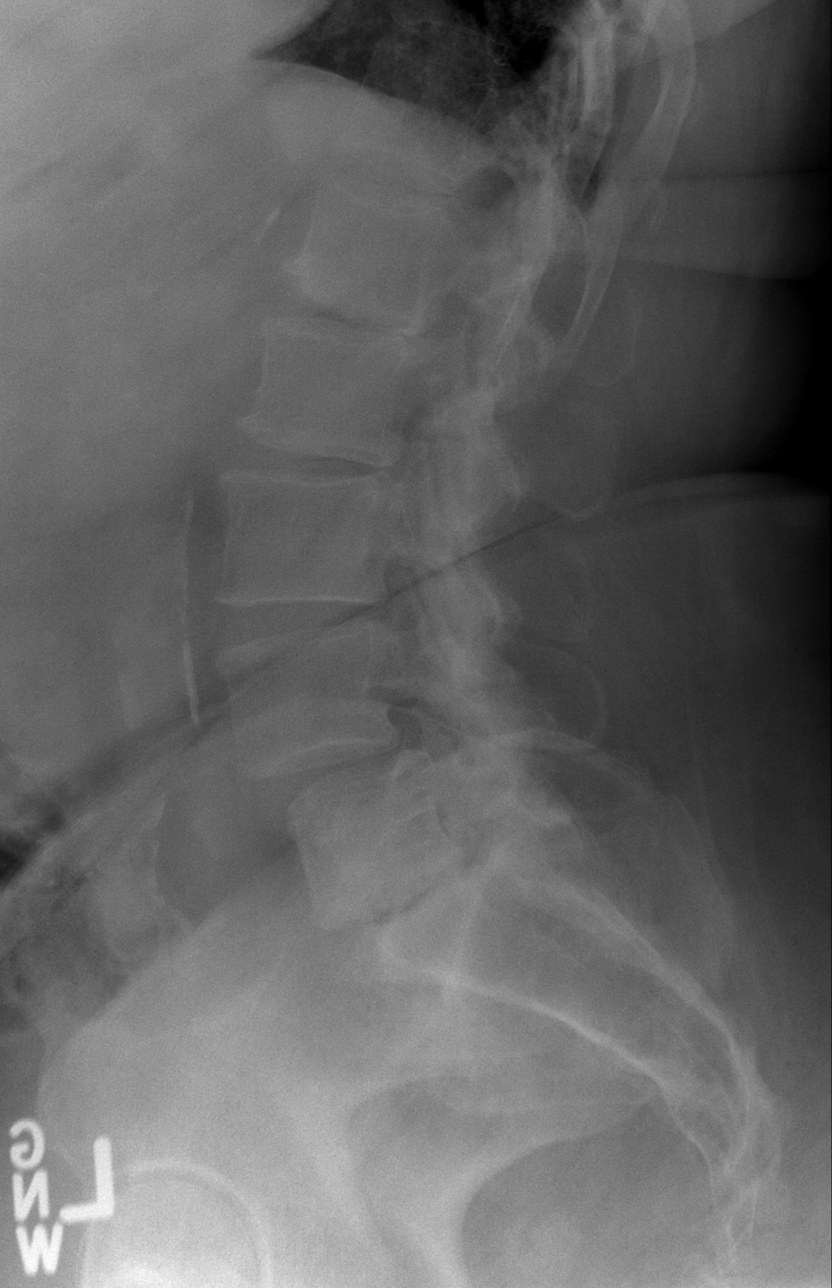

[t l-spine l5-s1 spot *]
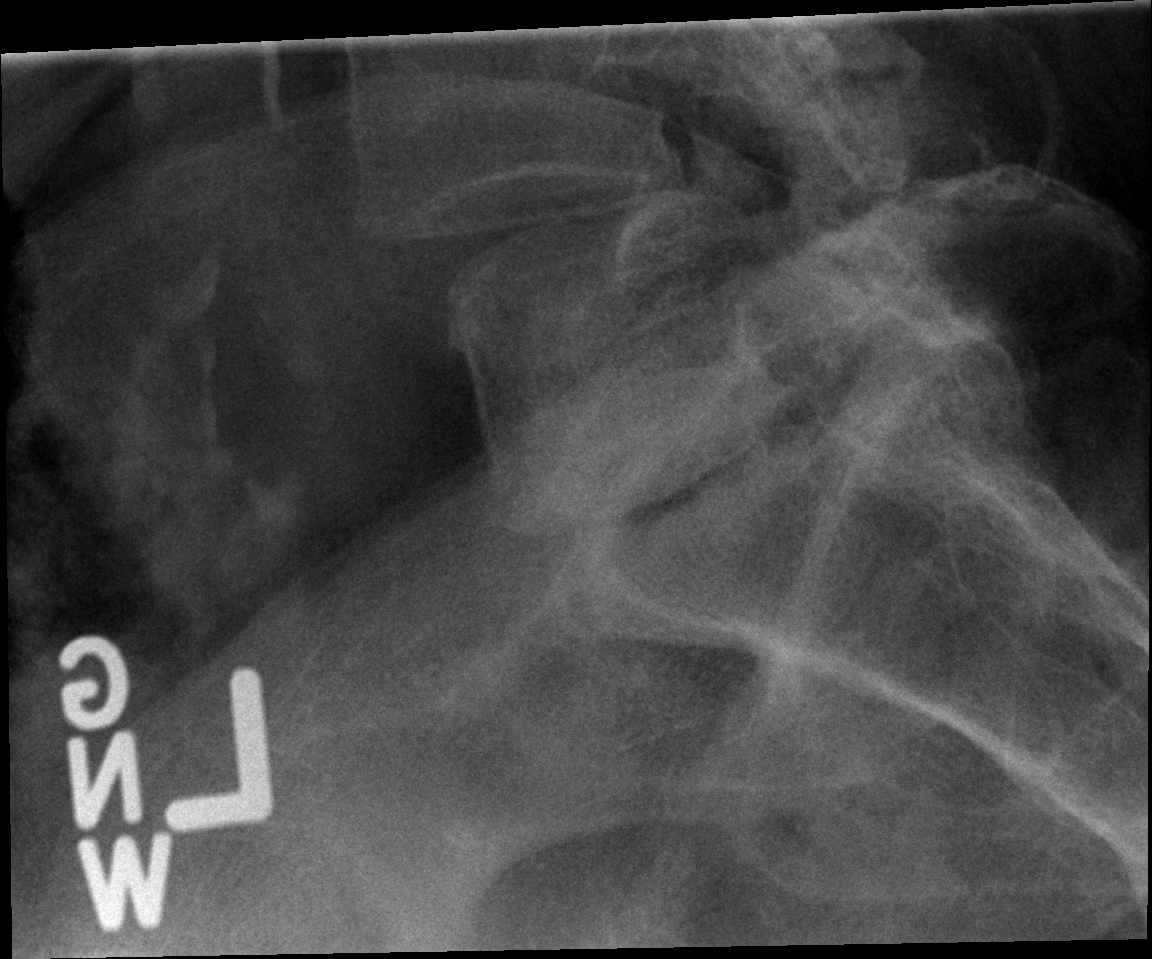

[5 of 5 positions shown; findings below may reference images not displayed]

FINDINGS: There is no evidence of acute fracture or subluxation.
There is mildly worsened grade 1 anterolisthesis of L4 on L5,
measuring approximately 6 mm.  Intervertebral disc space narrowing
at L5-S1 appears worsened, with associated endplate sclerotic
change.  Vertebral bodies otherwise demonstrate normal height and
alignment.  Facet disease is noted along the lumbar spine.

The visualized bowel gas pattern is unremarkable in appearance; air
and stool are noted within the colon.  The sacroiliac joints are
within normal limits.
IMPRESSION: 1.  No evidence of acute fracture or subluxation along the lumbar
spine.
2.  Mildly worsened grade 1 anterolisthesis of L4 on L5; mildly
worsened degenerative change at L5-S1.

## 2010-12-29 MED ORDER — HYDROCODONE-ACETAMINOPHEN 5-500 MG PO TABS: 1.0000 | ORAL_TABLET | Freq: Four times a day (QID) | ORAL | Status: AC | PRN

## 2010-12-29 NOTE — ED Notes (Signed)
Pt stated decrease back pain 3 level since lying down.

## 2010-12-29 NOTE — ED Provider Notes (Signed)
History     CSN: [id number] Arrival date & time: 12/28/2010  7:50 PM   First MD Initiated Contact with Patient 12/29/10 0205      Chief Complaint  Patient presents with  . Back Pain    (Consider location/radiation/quality/duration/timing/severity/associated sxs/prior treatment) Patient is a 65 y.o. female presenting with back pain. The history is provided by the patient.  Back Pain  This is a new problem. The current episode started 2 days ago. The problem occurs constantly. The problem has not changed since onset.The pain is associated with no known injury. The pain is present in the lumbar spine. The pain does not radiate. The pain is mild. The symptoms are aggravated by bending and twisting. The pain is the same all the time. Pertinent negatives include no chest pain, no fever, no numbness, no weight loss, no headaches, no abdominal pain, no abdominal swelling, no bowel incontinence, no perianal numbness, no bladder incontinence, no dysuria, no pelvic pain, no leg pain, no paresthesias, no paresis, no tingling and no weakness. She has tried NSAIDs for the symptoms. The treatment provided no relief. Risk factors include obesity.   lower back pain across her back. No known injury and no history of same. Patient is concerned she may have a bladder infection although she denies any dysuria, urgency or frequency. No fevers or vomiting. No other symptoms  Past Medical History  Diagnosis Date  . Arthritis   . Diverticular disease   . Colostomy care 2004  . Diabetes mellitus   . Coronary artery disease   . Thyroid disease     Past Surgical History  Procedure Date  . Colon surgery     History reviewed. No pertinent family history.  History  Substance Use Topics  . Smoking status: Never Smoker   . Smokeless tobacco: Not on file  . Alcohol Use: No    OB History    Grav Para Term Preterm Abortions TAB SAB Ect Mult Living                  Review of Systems  Constitutional:  Negative for fever, chills and weight loss.  HENT: Negative for neck pain and neck stiffness.   Eyes: Negative for pain.  Respiratory: Negative for shortness of breath.   Cardiovascular: Negative for chest pain.  Gastrointestinal: Negative for abdominal pain and bowel incontinence.  Genitourinary: Negative for bladder incontinence, dysuria and pelvic pain.  Musculoskeletal: Positive for back pain.  Skin: Negative for rash.  Neurological: Negative for tingling, weakness, numbness, headaches and paresthesias.  All other systems reviewed and are negative.    Allergies  Review of patient's allergies indicates no known allergies.  Home Medications   Current Outpatient Rx  Name Route Sig Dispense Refill  . OPTIVE OP Both Eyes Place 1 drop into both eyes daily as needed. For dry eyes     . CYCLOSPORINE 0.05 % OP EMUL Both Eyes Place 1 drop into both eyes 2 (two) times daily.      . ETODOLAC 400 MG PO TABS Oral Take 400 mg by mouth 2 (two) times daily.      Marland Kitchen FOLIC ACID 1 MG PO TABS Oral Take 1 mg by mouth daily.      . FUROSEMIDE 20 MG PO TABS Oral Take 20 mg by mouth daily.      Marland Kitchen LEVOTHYROXINE SODIUM 50 MCG PO TABS Oral Take 50 mcg by mouth daily.      Marland Kitchen METHOTREXATE 2.5 MG PO TABS Oral  Take 10 mg by mouth 2 (two) times a week. Take 4 tablets on Saturday & Sunday. Caution:Chemotherapy. Protect from light.     . OLMESARTAN MEDOXOMIL 20 MG PO TABS Oral Take 10 mg by mouth daily.      Marland Kitchen SIMVASTATIN 40 MG PO TABS Oral Take 40 mg by mouth at bedtime.      Marland Kitchen HYDROCODONE-ACETAMINOPHEN 5-500 MG PO TABS Oral Take 1-2 tablets by mouth every 6 (six) hours as needed for pain. 15 tablet 0    BP 123/61  Pulse 84  Temp(Src) 98.4 F (36.9 C) (Oral)  Resp 18  SpO2 98%  Physical Exam  Constitutional: She is oriented to person, place, and time. She appears well-developed and well-nourished.  HENT:  Head: Normocephalic and atraumatic.  Eyes: Conjunctivae and EOM are normal. Pupils are equal,  round, and reactive to light.  Neck: Full passive range of motion without pain. Neck supple. No thyromegaly present.       No meningismus  Cardiovascular: Normal rate, regular rhythm, S1 normal, S2 normal and intact distal pulses.   Pulmonary/Chest: Effort normal and breath sounds normal.  Abdominal: Soft. Bowel sounds are normal. There is no tenderness. There is no CVA tenderness.  Musculoskeletal: Normal range of motion.       Localizes discomfort to lower lumbar region both midline and right and left paraspinal without any significant reproducible tenderness. No CVA tenderness. No deformity. No lower extremity deficits with equal strength sensorium and DTRs  Neurological: She is alert and oriented to person, place, and time. She has normal strength and normal reflexes. No cranial nerve deficit or sensory deficit. She displays a negative Romberg sign. GCS eye subscore is 4. GCS verbal subscore is 5. GCS motor subscore is 6.       Normal Gait  Skin: Skin is warm and dry. No rash noted. No cyanosis. Nails show no clubbing.  Psychiatric: She has a normal mood and affect. Her speech is normal and behavior is normal.    ED Course  Procedures (including critical care time)  Labs Reviewed  URINALYSIS, ROUTINE W REFLEX MICROSCOPIC - Abnormal; Notable for the following:    APPearance CLOUDY (*)    Bilirubin Urine MODERATE (*)    Leukocytes, UA MODERATE (*)    All other components within normal limits  URINE MICROSCOPIC-ADD ON - Abnormal; Notable for the following:    Squamous Epithelial / LPF MANY (*)    Bacteria, UA MANY (*)    Casts HYALINE CASTS (*)    All other components within normal limits  CBC - Abnormal; Notable for the following:    RBC 3.22 (*)    Hemoglobin 9.5 (*)    HCT 30.4 (*)    All other components within normal limits  POCT I-STAT, CHEM 8 - Abnormal; Notable for the following:    BUN 33 (*)    Creatinine, Ser 1.40 (*)    Glucose, Bld 106 (*)    Hemoglobin 10.2 (*)     HCT 30.0 (*)    All other components within normal limits  DIFFERENTIAL  URINE CULTURE   Dg Lumbar Spine Complete  12/29/2010  *RADIOLOGY REPORT*  Clinical Data: Lower back pain for 1 week.  LUMBAR SPINE - COMPLETE 4+ VIEW  Comparison: CT of the abdomen and pelvis performed 07/05/2009, and lumbar spine radiographs performed 07/04/2005  Findings: There is no evidence of acute fracture or subluxation. There is mildly worsened grade 1 anterolisthesis of L4 on L5, measuring approximately 6  mm.  Intervertebral disc space narrowing at L5-S1 appears worsened, with associated endplate sclerotic change.  Vertebral bodies otherwise demonstrate normal height and alignment.  Facet disease is noted along the lumbar spine.  The visualized bowel gas pattern is unremarkable in appearance; air and stool are noted within the colon.  The sacroiliac joints are within normal limits.  IMPRESSION:  1.  No evidence of acute fracture or subluxation along the lumbar spine. 2.  Mildly worsened grade 1 anterolisthesis of L4 on L5; mildly worsened degenerative change at L5-S1.  Original Report Authenticated By: Santa Lighter, M.D.     1. Back pain   2. Elevated serum creatinine       MDM   Workup as above for lumbar pain. UA reviewed and urine culture sent. Likely musculoskeletal given nature of complaints and clinical presentation. No red flights to suggest indication for emergent MRI at this time. Plan primary care followup for back pain and elevated creatinine        Teressa Lower, MD 12/29/10 2259

## 2010-12-30 ENCOUNTER — Ambulatory Visit (HOSPITAL_COMMUNITY): Admission: RE | Admit: 2010-12-30 | Payer: Medicaid Other | Source: Ambulatory Visit

## 2010-12-30 ENCOUNTER — Encounter (HOSPITAL_COMMUNITY): Payer: Self-pay

## 2010-12-30 ENCOUNTER — Encounter (HOSPITAL_COMMUNITY)
Admission: RE | Admit: 2010-12-30 | Discharge: 2010-12-30 | Disposition: A | Discharge status: Home / Self Care | Payer: Medicaid Other | Source: Ambulatory Visit | Attending: Ophthalmology | Admitting: Ophthalmology

## 2010-12-30 ENCOUNTER — Other Ambulatory Visit (HOSPITAL_COMMUNITY): Payer: Self-pay | Admitting: Ophthalmology

## 2010-12-30 ENCOUNTER — Other Ambulatory Visit: Payer: Self-pay | Admitting: Ophthalmology

## 2010-12-30 ENCOUNTER — Ambulatory Visit (HOSPITAL_COMMUNITY)
Admission: RE | Admit: 2010-12-30 | Discharge: 2010-12-30 | Disposition: A | Discharge status: Home / Self Care | Payer: Medicaid Other | Source: Ambulatory Visit | Attending: Ophthalmology | Admitting: Ophthalmology

## 2010-12-30 DIAGNOSIS — Z01811 Encounter for preprocedural respiratory examination: Secondary | ICD-10-CM

## 2010-12-30 DIAGNOSIS — I10 Essential (primary) hypertension: Secondary | ICD-10-CM | POA: Insufficient documentation

## 2010-12-30 DIAGNOSIS — Z01818 Encounter for other preprocedural examination: Secondary | ICD-10-CM | POA: Insufficient documentation

## 2010-12-30 HISTORY — DX: Essential (primary) hypertension: I10

## 2010-12-30 HISTORY — DX: Shortness of breath: R06.02

## 2010-12-30 HISTORY — DX: Hyperlipidemia, unspecified: E78.5

## 2010-12-30 HISTORY — DX: Rheumatoid arthritis, unspecified: M06.9

## 2010-12-30 HISTORY — DX: Hypothyroidism, unspecified: E03.9

## 2010-12-30 IMAGING — CR DG CHEST 2V
2 series · 2 of 2 positions shown · non-contrast
Comparison: [DATE]

CLINICAL DATA: Preop

CHEST - 2 VIEW

[view not recorded (1 of 2)]
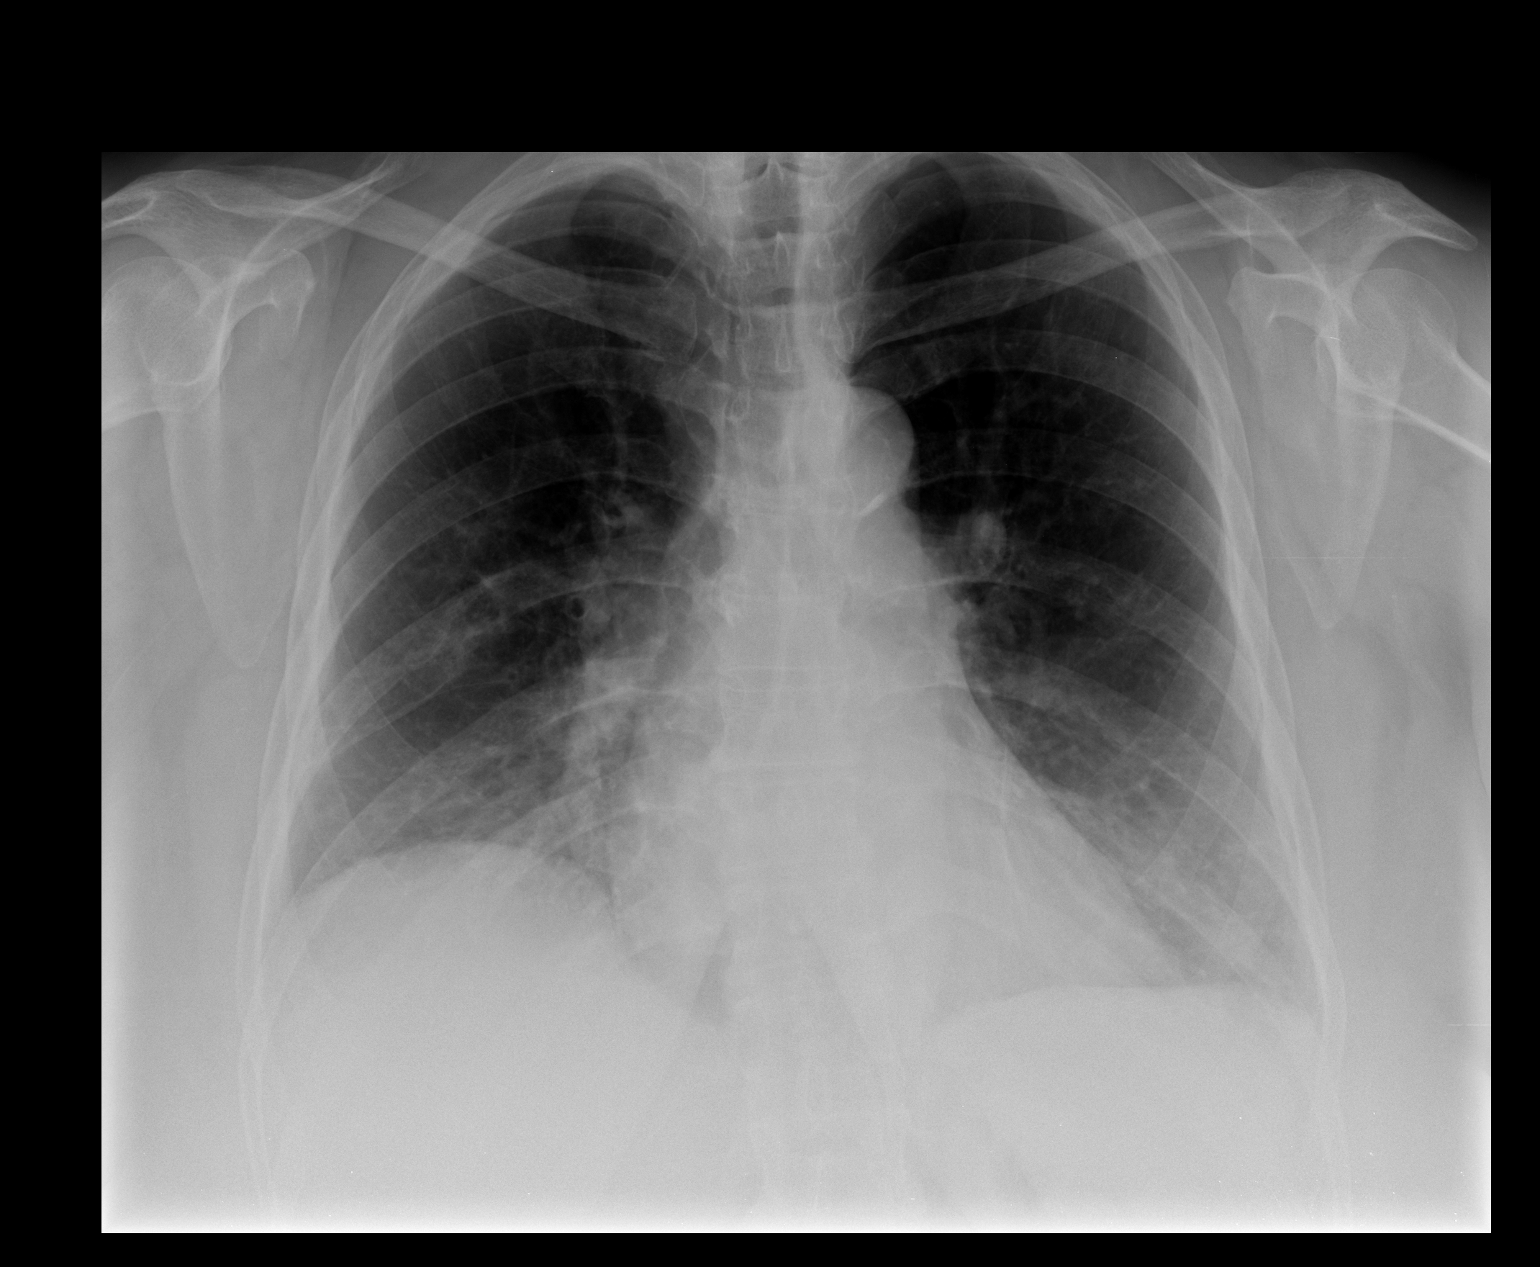

[view not recorded (2 of 2)]
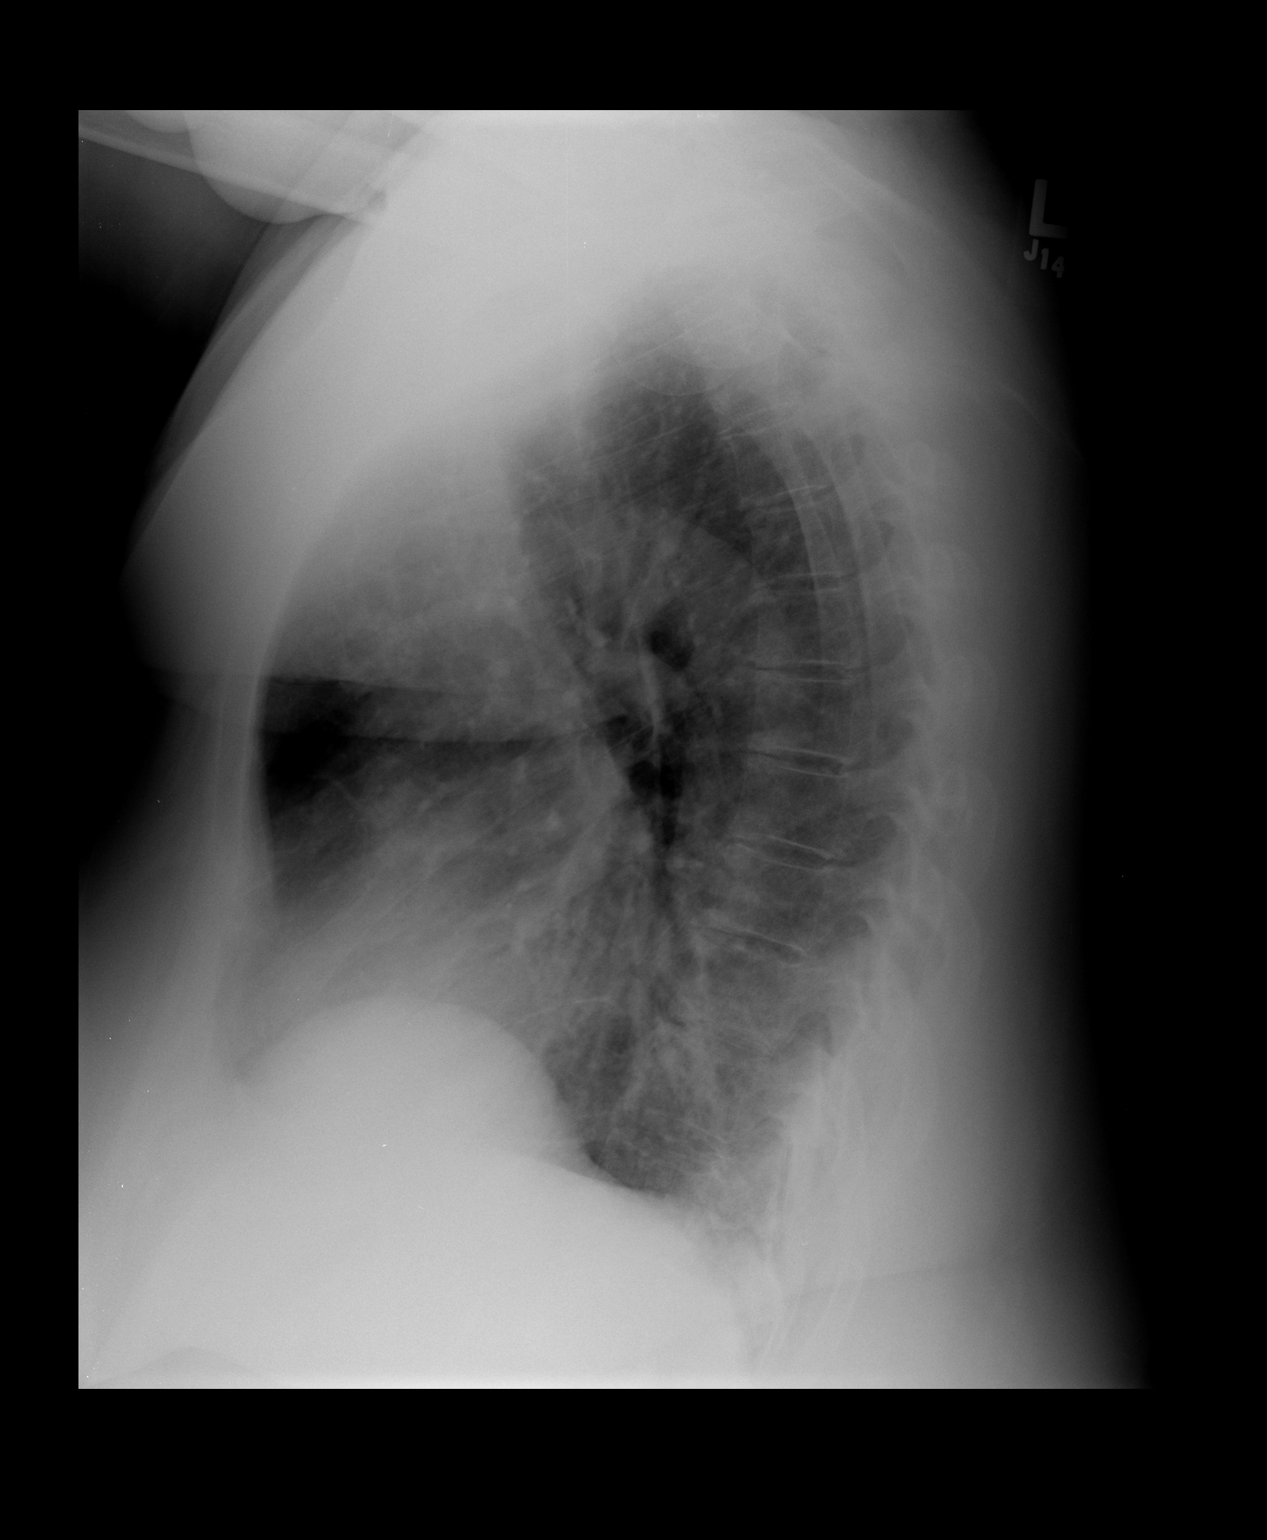

[2 of 2 positions shown; findings below may reference images not displayed]

FINDINGS: Cardiomediastinal silhouette is stable.  No acute
infiltrate or pulmonary edema.  Mild elevation of the right
hemidiaphragm again noted.  Mild degenerative changes thoracic
spine.  Stable bilateral basilar atelectasis or scarring.
IMPRESSION: No active disease.  No significant change.

## 2010-12-30 NOTE — Pre-Procedure Instructions (Signed)
[place]  12/30/2010   Your procedure is scheduled on:  Wed,Dec 5th  Report to [facility] at Call at 8 AM to find out arrival time  Call this number if you have problems the morning of surgery: [phone]   Remember:   Do not eat food:After Midnight.  May have clear liquids: up to 4 Hours before arrival.(nothing after 4am)  Clear liquids include soda, tea, black coffee, apple or grape juice, broth.  Take these medicines the morning of surgery with A SIP OF WATER: Pain Pill(if needed),Levothyroxine   Do not wear jewelry, make-up or nail polish.  Do not wear lotions, powders, or perfumes. You may wear deodorant.  Do not shave 48 hours prior to surgery.  Do not bring valuables to the hospital.  Contacts, dentures or bridgework may not be worn into surgery.  Leave suitcase in the car. After surgery it may be brought to your room.  For patients admitted to the hospital, checkout time is 11:00 AM the day of discharge.   Patients discharged the day of surgery will not be allowed to drive home.  Name and phone number of your driver:   Special Instructions: CHG Shower Use Special Wash: 1/2 bottle night before surgery and 1/2 bottle morning of surgery.   Please read over the following fact sheets that you were given: Pain Booklet, Coughing and Deep Breathing and Surgical Site Infection Prevention

## 2010-12-30 NOTE — Progress Notes (Signed)
Heart cath in epic  Nuclear Stress Test 4+yrs ago

## 2011-01-01 ENCOUNTER — Ambulatory Visit (HOSPITAL_COMMUNITY)
Admission: RE | Admit: 2011-01-01 | Discharge: 2011-01-01 | Disposition: A | Discharge status: Home / Self Care | Payer: Medicaid Other | Source: Ambulatory Visit | Attending: Ophthalmology | Admitting: Ophthalmology

## 2011-01-01 ENCOUNTER — Encounter (HOSPITAL_COMMUNITY): Payer: Self-pay | Admitting: Anesthesiology

## 2011-01-01 ENCOUNTER — Encounter (HOSPITAL_COMMUNITY): Payer: Self-pay | Admitting: *Deleted

## 2011-01-01 ENCOUNTER — Ambulatory Visit (HOSPITAL_COMMUNITY): Payer: Medicaid Other | Admitting: Anesthesiology

## 2011-01-01 ENCOUNTER — Encounter (HOSPITAL_COMMUNITY)
Admission: RE | Disposition: A | Discharge status: Home / Self Care | Payer: Self-pay | Source: Ambulatory Visit | Attending: Ophthalmology

## 2011-01-01 DIAGNOSIS — K219 Gastro-esophageal reflux disease without esophagitis: Secondary | ICD-10-CM | POA: Insufficient documentation

## 2011-01-01 DIAGNOSIS — E039 Hypothyroidism, unspecified: Secondary | ICD-10-CM | POA: Insufficient documentation

## 2011-01-01 DIAGNOSIS — M129 Arthropathy, unspecified: Secondary | ICD-10-CM | POA: Insufficient documentation

## 2011-01-01 DIAGNOSIS — E119 Type 2 diabetes mellitus without complications: Secondary | ICD-10-CM | POA: Insufficient documentation

## 2011-01-01 DIAGNOSIS — H269 Unspecified cataract: Secondary | ICD-10-CM | POA: Insufficient documentation

## 2011-01-01 DIAGNOSIS — R0602 Shortness of breath: Secondary | ICD-10-CM | POA: Insufficient documentation

## 2011-01-01 DIAGNOSIS — I10 Essential (primary) hypertension: Secondary | ICD-10-CM | POA: Insufficient documentation

## 2011-01-01 DIAGNOSIS — I2510 Atherosclerotic heart disease of native coronary artery without angina pectoris: Secondary | ICD-10-CM | POA: Insufficient documentation

## 2011-01-01 HISTORY — PX: CATARACT EXTRACTION W/PHACO: SHX586

## 2011-01-01 SURGERY — PHACOEMULSIFICATION, CATARACT, WITH IOL INSERTION
Anesthesia: Monitor Anesthesia Care | Site: Eye | Laterality: Left | Wound class: Clean

## 2011-01-01 MED ORDER — DEXAMETHASONE SODIUM PHOSPHATE 10 MG/ML IJ SOLN
INTRAMUSCULAR | Status: DC | PRN
  Administered 2011-01-01: 10 mg

## 2011-01-01 MED ORDER — NA CHONDROIT SULF-NA HYALURON 40-30 MG/ML IO SOLN
INTRAOCULAR | Status: DC | PRN
  Administered 2011-01-01: 0.5 mL via INTRAOCULAR

## 2011-01-01 MED ORDER — BACITRACIN-POLYMYXIN B 500-10000 UNIT/GM OP OINT
TOPICAL_OINTMENT | OPHTHALMIC | Status: DC | PRN
  Administered 2011-01-01: 1 via OPHTHALMIC

## 2011-01-01 MED ORDER — PROVISC 10 MG/ML IO SOLN
INTRAOCULAR | Status: DC | PRN
  Administered 2011-01-01: .85 mL via INTRAOCULAR

## 2011-01-01 MED ORDER — ONDANSETRON HCL 4 MG/2ML IJ SOLN
INTRAMUSCULAR | Status: DC | PRN
  Administered 2011-01-01: 4 mg via INTRAVENOUS

## 2011-01-01 MED ORDER — PREDNISOLONE ACETATE 1 % OP SUSP
1.0000 [drp] | OPHTHALMIC | Status: AC
  Administered 2011-01-01: 1 [drp] via OPHTHALMIC
  Filled 2011-01-01: qty 5

## 2011-01-01 MED ORDER — SODIUM CHLORIDE 0.9 % IV SOLN
INTRAVENOUS | Status: DC
  Administered 2011-01-01: 15:00:00 via INTRAVENOUS

## 2011-01-01 MED ORDER — HYPROMELLOSE (GONIOSCOPIC) 2.5 % OP SOLN
OPHTHALMIC | Status: DC | PRN
  Administered 2011-01-01: 2 [drp] via OPHTHALMIC

## 2011-01-01 MED ORDER — BSS IO SOLN
INTRAOCULAR | Status: DC | PRN
  Administered 2011-01-01: 500 mL via INTRAOCULAR

## 2011-01-01 MED ORDER — CEFAZOLIN SUBCONJUNCTIVAL INJECTION 100 MG/0.5 ML
200.0000 mg | INJECTION | SUBCONJUNCTIVAL | Status: DC
  Filled 2011-01-01: qty 1

## 2011-01-01 MED ORDER — BUPIVACAINE HCL 0.75 % IJ SOLN
INTRAMUSCULAR | Status: DC | PRN
  Administered 2011-01-01: 5 mL

## 2011-01-01 MED ORDER — SODIUM CHLORIDE 0.9 % IV SOLN
INTRAVENOUS | Status: DC | PRN
  Administered 2011-01-01: 15:00:00 via INTRAVENOUS

## 2011-01-01 MED ORDER — GATIFLOXACIN 0.5 % OP SOLN
1.0000 [drp] | OPHTHALMIC | Status: AC
  Administered 2011-01-01: 1 [drp] via OPHTHALMIC
  Filled 2011-01-01 (×2): qty 2.5

## 2011-01-01 MED ORDER — PHENYLEPHRINE HCL 2.5 % OP SOLN
1.0000 [drp] | OPHTHALMIC | Status: AC
  Administered 2011-01-01: 1 [drp] via OPHTHALMIC
  Filled 2011-01-01 (×2): qty 3

## 2011-01-01 MED ORDER — TETRACAINE HCL 0.5 % OP SOLN
2.0000 [drp] | OPHTHALMIC | Status: AC
  Administered 2011-01-01: 2 [drp] via OPHTHALMIC
  Filled 2011-01-01: qty 2

## 2011-01-01 MED ORDER — PROPOFOL 10 MG/ML IV EMUL
INTRAVENOUS | Status: DC | PRN
  Administered 2011-01-01: 30 mg via INTRAVENOUS

## 2011-01-01 MED ORDER — LACTATED RINGERS IV SOLN: INTRAVENOUS | Status: DC

## 2011-01-01 MED ORDER — LIDOCAINE HCL (PF) 2 % IJ SOLN
INTRAMUSCULAR | Status: DC | PRN
  Administered 2011-01-01: 5 mL

## 2011-01-01 MED ORDER — CEFAZOLIN SUBCONJUNCTIVAL INJECTION 100 MG/0.5 ML
INJECTION | SUBCONJUNCTIVAL | Status: DC | PRN
  Administered 2011-01-01: 1 mL via SUBCONJUNCTIVAL

## 2011-01-01 MED ORDER — EPINEPHRINE HCL 1 MG/ML IJ SOLN
INTRAOCULAR | Status: DC | PRN
  Administered 2011-01-01: 15:00:00

## 2011-01-01 MED ORDER — HOMATROPINE HBR 2 % OP SOLN
1.0000 [drp] | OPHTHALMIC | Status: AC
  Administered 2011-01-01: 1 [drp] via OPHTHALMIC
  Filled 2011-01-01: qty 5

## 2011-01-01 MED ORDER — TETRACAINE HCL 0.5 % OP SOLN
OPHTHALMIC | Status: DC | PRN
  Administered 2011-01-01: 2 [drp] via OPHTHALMIC

## 2011-01-01 SURGICAL SUPPLY — 55 items
APPLICATOR COTTON TIP 6IN STRL (MISCELLANEOUS) ×2 IMPLANT
APPLICATOR DR MATTHEWS STRL (MISCELLANEOUS) IMPLANT
BLADE EYE MINI 60D BEAVER (BLADE) IMPLANT
BLADE KERATOME 2.75 (BLADE) ×2 IMPLANT
BLADE STAB KNIFE 15DEG (BLADE) IMPLANT
CANNULA ANTERIOR CHAMBER 27GA (MISCELLANEOUS) IMPLANT
CLOTH BEACON ORANGE TIMEOUT ST (SAFETY) ×2 IMPLANT
DRAPE OPHTHALMIC 40X48 W POUCH (DRAPES) ×2 IMPLANT
DRAPE OPHTHALMIC 77X100 STRL (CUSTOM PROCEDURE TRAY) ×2 IMPLANT
DRAPE POUCH INSTRU U-SHP 10X18 (DRAPES) ×2 IMPLANT
DRSG TEGADERM 4X4.75 (GAUZE/BANDAGES/DRESSINGS) ×2 IMPLANT
ETHILON ×2 IMPLANT
EYE SHIELD UNIVERSAL CLEAR (GAUZE/BANDAGES/DRESSINGS) ×2 IMPLANT
FILTER BLUE MILLIPORE (MISCELLANEOUS) IMPLANT
GLOVE SS BIOGEL STRL SZ 6.5 (GLOVE) ×2 IMPLANT
GLOVE SUPERSENSE BIOGEL SZ 6.5 (GLOVE) ×2
GLOVE SURG SS PI 6.5 STRL IVOR (GLOVE) ×4 IMPLANT
GOWN PREVENTION PLUS XLARGE (GOWN DISPOSABLE) ×2 IMPLANT
GOWN STRL NON-REIN LRG LVL3 (GOWN DISPOSABLE) ×2 IMPLANT
KIT ROOM TURNOVER OR (KITS) IMPLANT
KNIFE GRIESHABER SHARP 2.5MM (MISCELLANEOUS) ×2 IMPLANT
LENS IOL ACRYSOF MP POST 20.5 (Intraocular Lens) ×2 IMPLANT
MARKER SKIN DUAL TIP RULER LAB (MISCELLANEOUS) ×2 IMPLANT
NEEDLE 18GX1X1/2 (RX/OR ONLY) (NEEDLE) ×4 IMPLANT
NEEDLE 22X1 1/2 (OR ONLY) (NEEDLE) ×2 IMPLANT
NEEDLE 25GX 5/8IN NON SAFETY (NEEDLE) ×4 IMPLANT
NEEDLE HYPO 30X.5 LL (NEEDLE) ×4 IMPLANT
NS IRRIG 1000ML POUR BTL (IV SOLUTION) ×2 IMPLANT
PACK CATARACT CUSTOM (CUSTOM PROCEDURE TRAY) ×2 IMPLANT
PACK CATARACT MCHSCP (PACKS) ×2 IMPLANT
PACK COMBINED CATERACT/VIT 23G (OPHTHALMIC RELATED) IMPLANT
PAD ARMBOARD 7.5X6 YLW CONV (MISCELLANEOUS) ×4 IMPLANT
PAD EYE OVAL STERILE LF (GAUZE/BANDAGES/DRESSINGS) ×2 IMPLANT
PHACO TIP KELMAN 45DEG (TIP) ×2 IMPLANT
PROBE ANTERIOR 20G W/INFUS NDL (MISCELLANEOUS) IMPLANT
ROLLS DENTAL (MISCELLANEOUS) IMPLANT
SHUTTLE MONARCH TYPE A (NEEDLE) ×2 IMPLANT
SOLUTION ANTI FOG 6CC (MISCELLANEOUS) ×2 IMPLANT
SPEAR EYE SURG WECK-CEL (MISCELLANEOUS) ×2 IMPLANT
SUT ETHILON 10 0 CS140 6 (SUTURE) IMPLANT
SUT ETHILON 10-0 CS-B-6CS-B-6 (SUTURE) ×2
SUT ETHILON 5 0 P 3 18 (SUTURE)
SUT ETHILON 9 0 TG140 8 (SUTURE) IMPLANT
SUT NYLON ETHILON 5-0 P-3 1X18 (SUTURE) IMPLANT
SUT PLAIN 6 0 TG1408 (SUTURE) IMPLANT
SUT POLY NON ABSORB 10-0 8 STR (SUTURE) IMPLANT
SUT VICRYL 6 0 S 29 12 (SUTURE) IMPLANT
SUTURE EHLN 10-0 CS-B-6CS-B-6 (SUTURE) ×1 IMPLANT
SYR 20CC LL (SYRINGE) IMPLANT
SYR 50ML LL SCALE MARK (SYRINGE) IMPLANT
SYR 5ML LL (SYRINGE) IMPLANT
SYR TB 1ML LUER SLIP (SYRINGE) ×2 IMPLANT
SYRINGE 10CC LL (SYRINGE) IMPLANT
TOWEL OR 17X24 6PK STRL BLUE (TOWEL DISPOSABLE) ×4 IMPLANT
WATER STERILE IRR 1000ML POUR (IV SOLUTION) ×2 IMPLANT

## 2011-01-01 NOTE — Op Note (Signed)
[name] 01/01/2011 _0 @  Procedure: Phacoemulsification, Posterior Chamber Intra-ocular Lens Operative Eye:  left eye  Surgeon: [name] Estimated Blood Loss: minimal Specimens for Pathology:  None Complications: none  The patient was prepared and draped in the usual manner for ocular surgery on the left eye. A Cook lid speculum was placed. A peripheral clear corneal incision was made at the surgical limbus centered at the 11:00 meridian. A separate clear corneal stab incision was made with a 15 degree blade at the 2:00 meridian to permit bi-manual technique. Provisc was instilled into the anterior chamber through that incision.  A keratome was used to create a self sealing incision entering the anterior chamber at the 11:00 meridian. A capsulorhexis was performed using a bent 25g needle. The lens was hydrodissected and the nucleus was hydrodilineated using a Nichammin cannula. The Chang chopper was inserted and used to rotate the lens to insure adequate lens mobility. The phacoemulsification handpiece was inserted and a combined phaco-chop technique was employed, fracturing the lens into separate sections with subsequent removal with the phaco handpiece.   The I/A cannula was used to remove remaining lens cortex. Provisc was instilled and used to deepen the anterior chamber and posterior capsule bag. The Monarch injector was used to place a folded Acrysof [id number] PC IOL, + 20.50  diopters, into the capsule bag. A Sinskey lens hook was used to dial in the trailing haptic.  The I/A cannula was used to remove the viscoelastic from the anterior chamber. BSS was used to bring IOP to the desired range and the wound was checked to insure it was watertight. Subconjunctival injections of Ance 100/0.90m and Dexamethasone 458m1ml were placed without complication. The lid speculum and drapes were removed and the patient's eye was patched with Polymixin/Bacitracin ophthalmic ointment. An eye shield was  placed and the patient was transferred alert and conversant from the operating room to the post-operative recovery area.   Marjory Meints, GRLavella Hammock

## 2011-01-01 NOTE — Anesthesia Preprocedure Evaluation (Addendum)
Anesthesia Evaluation  Patient identified by MRN, date of birth, ID band Patient awake    Reviewed: Allergy & Precautions, H&P , NPO status , Patient's Chart, lab work & pertinent test results  History of Anesthesia Complications Negative for: history of anesthetic complications  Airway Mallampati: III  Neck ROM: full    Dental   Pulmonary shortness of breath and with exertion,          Cardiovascular hypertension, Pt. on medications + CAD     Neuro/Psych    GI/Hepatic GERD-  ,  Endo/Other  Diabetes mellitus-Hypothyroidism Morbid obesity  Renal/GU      Musculoskeletal  (+) Arthritis -, Rheumatoid disorders,    Abdominal   Peds  Hematology   Anesthesia Other Findings   Reproductive/Obstetrics                          Anesthesia Physical Anesthesia Plan  ASA: III  Anesthesia Plan: MAC   Post-op Pain Management:    Induction: Intravenous  Airway Management Planned: Nasal Cannula  Additional Equipment:   Intra-op Plan:   Post-operative Plan:   Informed Consent: I have reviewed the patients History and Physical, chart, labs and discussed the procedure including the risks, benefits and alternatives for the proposed anesthesia with the patient or authorized representative who has indicated his/her understanding and acceptance.     Plan Discussed with: CRNA and Surgeon  Anesthesia Plan Comments:         Anesthesia Quick Evaluation

## 2011-01-01 NOTE — Preoperative (Signed)
Beta Blockers   Reason not to administer Beta Blockers:Not Applicable 

## 2011-01-01 NOTE — Transfer of Care (Signed)
Immediate Anesthesia Transfer of Care Note  Patient: [name]  Procedure(s) Performed:  CATARACT EXTRACTION PHACO AND INTRAOCULAR LENS PLACEMENT ([facility])  Patient Location: PACU and Short Stay  Anesthesia Type: MAC  Level of Consciousness: awake, alert , oriented and patient cooperative  Airway & Oxygen Therapy: Patient Spontanous Breathing  Post-op Assessment: Report given to PACU RN, Post -op Vital signs reviewed and stable and Patient moving all extremities  Post vital signs: Reviewed and stable  Complications: No apparent anesthesia complications

## 2011-01-01 NOTE — Interval H&P Note (Signed)
History and Physical Interval Note:  01/01/2011 2:56 PM  [name]  has presented today for surgery, with the diagnosis of Combined Cataract  The various methods of treatment have been discussed with the patient and family. After consideration of risks, benefits and other options for treatment, the patient has consented to  Procedure(s): CATARACT EXTRACTION PHACO AND INTRAOCULAR LENS PLACEMENT ([place]) as a surgical intervention .  The patients' history has been reviewed, patient examined, no change in status, stable for surgery.  I have reviewed the patients' chart and labs.  Questions were answered to the patient's satisfaction.     [name], MD

## 2011-01-02 NOTE — Anesthesia Postprocedure Evaluation (Signed)
Anesthesia Post Note  Patient: [name]  Procedure(s) Performed:  CATARACT EXTRACTION PHACO AND INTRAOCULAR LENS PLACEMENT ([facility])  Anesthesia type: MAC  Patient location: PACU  Post pain: Pain level controlled and Adequate analgesia  Post assessment: Post-op Vital signs reviewed, Patient's Cardiovascular Status Stable, Respiratory Function Stable, Patent Airway and Pain level controlled  Last Vitals:  Filed Vitals:   01/01/11 1656  BP:   Pulse: 98  Temp: 36.4 C  Resp: 16    Post vital signs: Reviewed and stable  Level of consciousness: awake, alert  and oriented  Complications: No apparent anesthesia complications

## 2011-01-03 ENCOUNTER — Encounter (HOSPITAL_COMMUNITY): Payer: Self-pay | Admitting: Ophthalmology

## 2011-02-25 ENCOUNTER — Other Ambulatory Visit: Payer: Self-pay | Admitting: Orthopedic Surgery

## 2011-02-25 ENCOUNTER — Ambulatory Visit
Admission: RE | Admit: 2011-02-25 | Discharge: 2011-02-25 | Disposition: A | Discharge status: Home / Self Care | Payer: Medicaid Other | Source: Ambulatory Visit | Attending: Orthopedic Surgery | Admitting: Orthopedic Surgery

## 2011-02-25 DIAGNOSIS — R52 Pain, unspecified: Secondary | ICD-10-CM

## 2011-02-25 IMAGING — CR DG WRIST COMPLETE 3+V*R*
4 series · 4 of 4 positions shown · non-contrast
Comparison: [DATE]
COMPARISON: [DATE]

<!--  IDXRADR:ADDEND:BEGIN -->Addendum Begins
<!--  IDXRADR:ADDEND:INNER_BEGIN -->***ADDENDUM*** CREATED: [DATE] [DATE]

The study was reviewed with Dr. AMUNDSEN along with outside films.
The cystic changes and sclerosis in the proximal lunate are most
compatible with ulnocarpal abutment.  The patient is relatively
asymptomatic.  There is some degenerative change at the second
carpometacarpal joint with a nonspecific carpal cyst in the
trapezoid bone.
Interestingly, the cystic changes and sclerosis of the lunate are
present bilaterally, again more suggestive of ulnocarpal abutment.
This could also represent secondary osteoarthritis associated with
prior rheumatoid although the left hand has been asymptomatic.
***END ADDENDUM*** SIGNED BY: AMUNDSEN, M.D.
CLINICAL DATA: Medial wrist pain.  History of surgery in [ML].  No
recent injury.
RIGHT WRIST - COMPLETE 3+ VIEW

[view not recorded (1 of 4)]
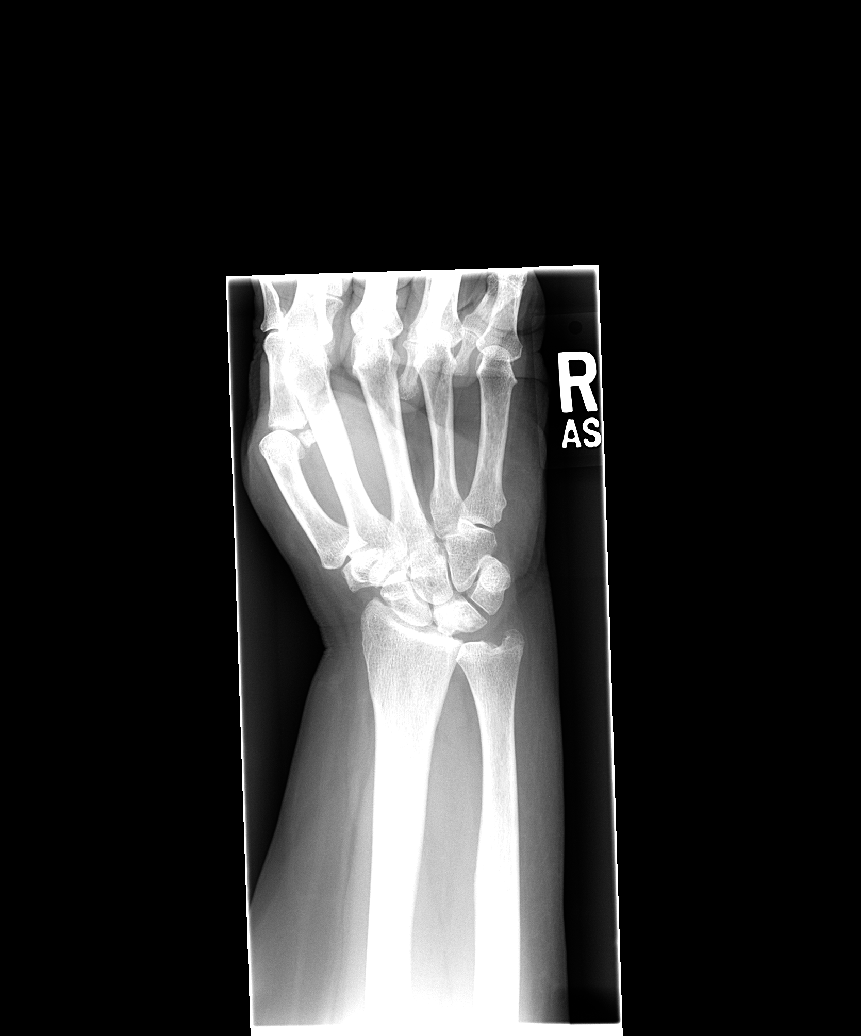

[view not recorded (2 of 4)]
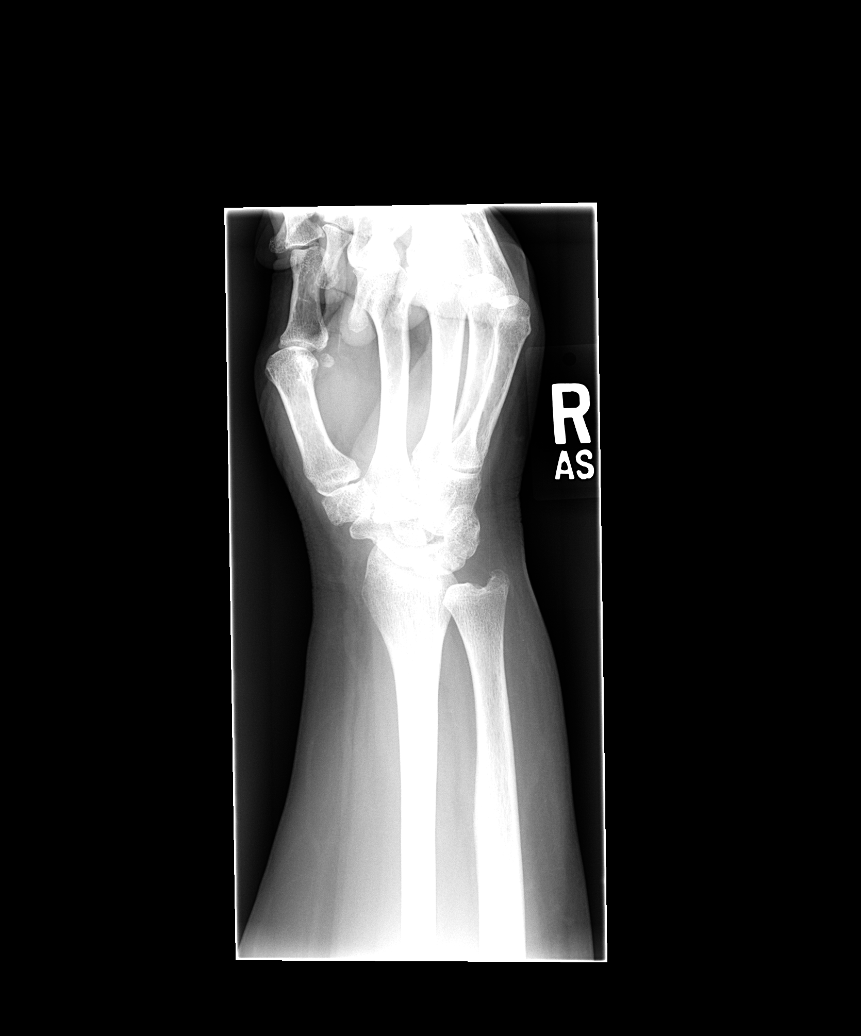

[view not recorded (3 of 4)]
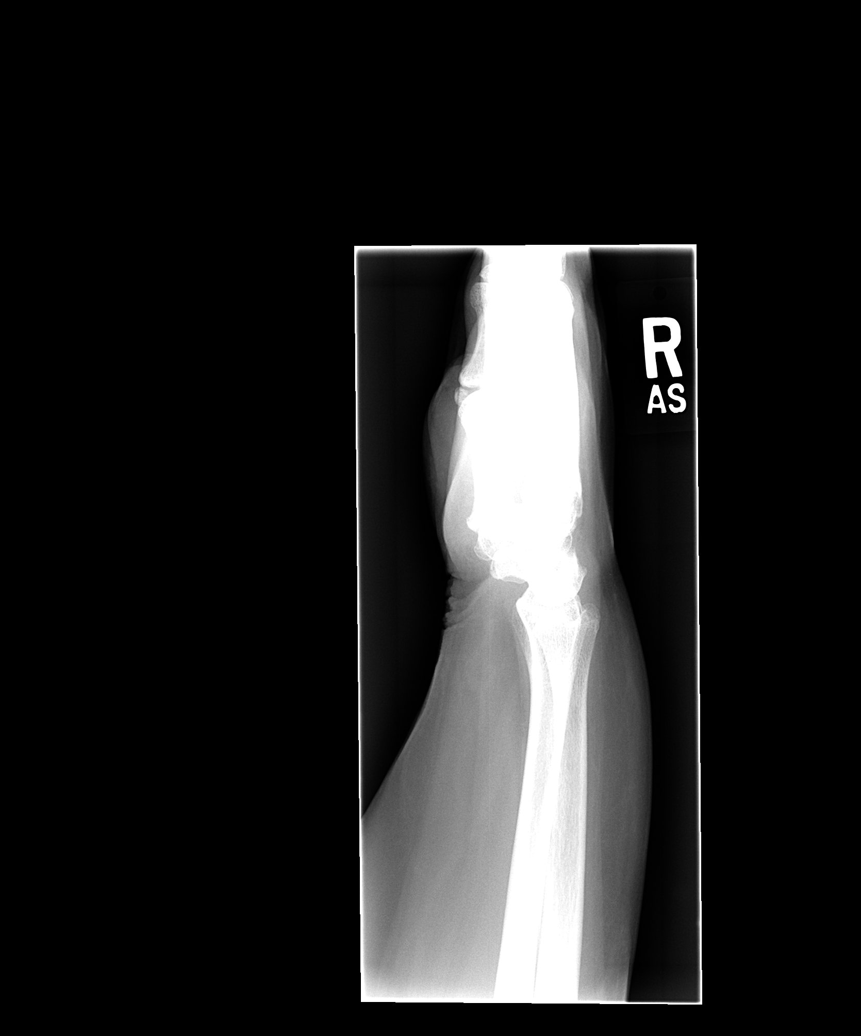

[view not recorded (4 of 4)]
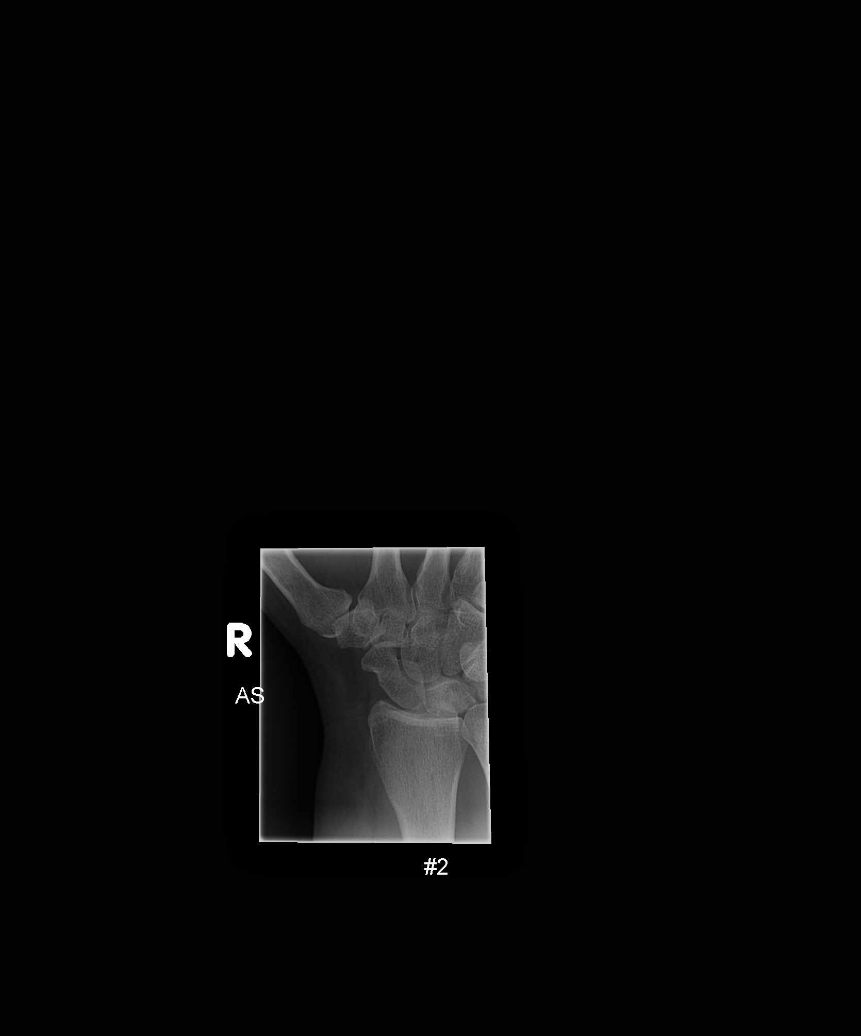

[4 of 4 positions shown; findings below may reference images not displayed]

FINDINGS: Degenerative changes of the radiocarpal joint. No acute
fracture or dislocation.  Scaphoid intact.

Vague increased density within the lunate.
IMPRESSION: 1.  Degeneration changes without acute fracture or dislocation.
2.  Vague increased density within the lunate.  Suspicious for
avascular necrosis.  MRI could confirm.

<!--  IDXRADR:ADDEND:INNER_END -->Addendum Ends
<!--  IDXRADR:ADDEND:END -->*RADIOLOGY REPORT*
FINDINGS: Degenerative changes of the radiocarpal joint. No acute
fracture or dislocation.  Scaphoid intact.

Vague increased density within the lunate.
IMPRESSION: 1.  Degeneration changes without acute fracture or dislocation.
2.  Vague increased density within the lunate.  Suspicious for
avascular necrosis.  MRI could confirm.

## 2011-11-24 ENCOUNTER — Other Ambulatory Visit: Payer: Self-pay | Admitting: Ophthalmology

## 2011-11-24 NOTE — H&P (Signed)
Pre-operative History and Physical for Ophthalmic Surgery  [name] 11/24/2011                  Chief Complaint: Decreased  Vision Right Eye  Diagnosis:  Combined Cataract  No Known Allergies   Prior to Admission medications   Medication Sig Start Date End Date Taking? Authorizing Provider  Carboxymethylcellul-Glycerin (OPTIVE OP) Place 1 drop into both eyes daily as needed. For dry eyes     Historical Provider, MD  cycloSPORINE (RESTASIS) 0.05 % ophthalmic emulsion Place 1 drop into both eyes 2 (two) times daily.      Historical Provider, MD  etodolac (LODINE) 400 MG tablet Take 400 mg by mouth 2 (two) times daily.      Historical Provider, MD  folic acid (FOLVITE) 1 MG tablet Take 1 mg by mouth daily.      Historical Provider, MD  furosemide (LASIX) 20 MG tablet Take 20 mg by mouth daily.      Historical Provider, MD  levothyroxine (SYNTHROID, LEVOTHROID) 50 MCG tablet Take 50 mcg by mouth daily.      Historical Provider, MD  methotrexate (RHEUMATREX) 2.5 MG tablet Take 10 mg by mouth 2 (two) times a week. Take 4 tablets on Saturday & Sunday. Caution:Chemotherapy. Protect from light.     Historical Provider, MD  olmesartan (BENICAR) 20 MG tablet Take 10 mg by mouth daily.      Historical Provider, MD  simvastatin (ZOCOR) 40 MG tablet Take 40 mg by mouth at bedtime.      Historical Provider, MD    Planned Procedure:                                       Phacoemulsification, Posterior Chamber Intra-ocular Lens Right Eye   There were no vitals filed for this visit.  Pulse: 80         Temp: NE        Resp:  17      ROS: some history of shortness of breath related weight  Past Medical History  Diagnosis Date  . Arthritis   . Diverticular disease   . Colostomy care 2004  . Diabetes mellitus   . Coronary artery disease   . Thyroid disease   . Hypertension     takes Benicar daily  . Hyperlipidemia     takes Zocor daily  . Bronchitis     recent diagnosis  . Cough    d/t bronchitis  . Shortness of breath     with exertion  . Vertigo   . Rheumatoid arthritis   . Gout     hx of  . Abdominal hernia   . Diverticulitis     portion of colon removed and pt has a colostomy since 2002-attempted in 2005 to reverse it and not successful  . Urinary frequency     d/t being on Lasix;pt was having a lot of fluid in lower legs  . Psoriasis   . Blood transfusion     yrs ago  . Hypothyroidism     takes Synthroid daily  . Cataracts, bilateral     Past Surgical History  Procedure Date  . Abdominal hysterectomy 1965  . Total knee arthroplasty 2005/2008    bilateral  . Knee arthroscopy     bilateral  . Colon surgery 2002    d/t diverticulosis  . Colostomy   .   Tonsillectomy   . Cataract extraction w/phaco 01/01/2011    Procedure: CATARACT EXTRACTION PHACO AND INTRAOCULAR LENS PLACEMENT ([facility]);  Surgeon: [name], MD;  Location: [facility];  Service: Ophthalmology;  Laterality: Left;     History   Social History  . Marital Status: Single    Spouse Name: N/A    Number of Children: N/A  . Years of Education: N/A   Occupational History  . Not on file.   Social History Main Topics  . Smoking status: Former Research scientist (life sciences)  . Smokeless tobacco: Not on file  . Alcohol Use: No  . Drug Use: No  . Sexually Active: No   Other Topics Concern  . Not on file   Social History Narrative  . No narrative on file     The following examination is for anesthesia clearance for minimally invasive Ophthalmic surgery. It is primarily to document heart and lung findings and is not intended to elucidate unknown general medical conditions inclusive of abdominal masses, lung lesions, etc.   General Constitution:  within normal limits   Alertness/Orientation:  Person, time place     yes   HEENT:  Eye Findings:  Combined Cataract                   right eye  Neck: supple without masses  Chest/Lungs: clear to auscultation  Cardiac: Normal S1 and S2 without Murmur, S3 or  S4  Neuro: non-focal  Impression:  Combined Cataract OD Planned Procedure:  Phacoemulsification, Posterior Chamber Intraocular Lens OD    [name], MD

## 2011-11-26 ENCOUNTER — Encounter (HOSPITAL_COMMUNITY): Payer: Self-pay | Admitting: Pharmacy Technician

## 2011-12-04 ENCOUNTER — Encounter (HOSPITAL_COMMUNITY): Admission: RE | Admit: 2011-12-04 | Payer: Medicaid Other | Source: Ambulatory Visit

## 2011-12-16 ENCOUNTER — Encounter (HOSPITAL_COMMUNITY)
Admission: RE | Admit: 2011-12-16 | Discharge: 2011-12-16 | Disposition: A | Discharge status: Home / Self Care | Payer: Medicaid Other | Source: Ambulatory Visit | Attending: Ophthalmology | Admitting: Ophthalmology

## 2011-12-16 ENCOUNTER — Encounter (HOSPITAL_COMMUNITY): Payer: Self-pay

## 2011-12-16 HISTORY — DX: Personal history of other diseases of the digestive system: Z87.19

## 2011-12-16 LAB — BASIC METABOLIC PANEL
BUN: 42 mg/dL — ABNORMAL HIGH (ref 6–23)
CO2: 26 mEq/L (ref 19–32)
Chloride: 105 mEq/L (ref 96–112)
Creatinine, Ser: 1.16 mg/dL — ABNORMAL HIGH (ref 0.50–1.10)
Potassium: 5.3 mEq/L — ABNORMAL HIGH (ref 3.5–5.1)

## 2011-12-16 LAB — CBC
HCT: 38.7 % (ref 36.0–46.0)
Hemoglobin: 12.4 g/dL (ref 12.0–15.0)
MCV: 85.4 fL (ref 78.0–100.0)
RDW: 13.3 % (ref 11.5–15.5)
WBC: 6.8 10*3/uL (ref 4.0–10.5)

## 2011-12-16 NOTE — Progress Notes (Signed)
Faxed Request to Dr. Doylene Canard office's requesting last office notes, stress test, EKG, Echo, cardiac cath,

## 2011-12-16 NOTE — Pre-Procedure Instructions (Addendum)
[place]  12/16/2011   Your procedure is scheduled on: Wednesday,  November 27  Report to [facility] at 08:00 AM.  Call this number if you have problems the morning of surgery: [phone]   Remember:   Do not eat or drink:After Midnight.  Take these medicines the morning of surgery with A SIP OF WATER: Tylenol, Synthroid, Prednisone   Do not wear jewelry, make-up or nail polish.  Do not wear lotions, powders, or perfumes.   Do not shave 48 hours prior to surgery. Men may shave face and neck.  Do not bring valuables to the hospital.  Contacts, dentures or bridgework may not be worn into surgery.  Leave suitcase in the car. After surgery it may be brought to your room.  For patients admitted to the hospital, checkout time is 11:00 AM the day of discharge.   Patients discharged the day of surgery will not be allowed to drive home.  Name and phone number of your driver: family / friend  Special Instructions: Shower using CHG 2 nights before surgery and the night before surgery.  If you shower the day of surgery use CHG.  Use special wash - you have one bottle of CHG for all showers.  You should use approximately 1/3 of the bottle for each shower.   Please read over the following fact sheets that you were given: Pain Booklet, Coughing and Deep Breathing and Surgical Site Infection Prevention

## 2011-12-18 NOTE — Consult Note (Addendum)
Anesthesia chart review: Patient is a 66 year old female scheduled for phacoemulsification posterior chamber intraocular lens, right eye by Dr. Anderson Malta on 12/24/2011. She is status post the same procedure on her left eye on 01/01/2011. Other history includes morbid obesity, former smoker, rheumatoid arthritis on daily prednisone, borderline diabetes mellitus type 2, hypertension, non-obstructive CAD (by 2008 cath), hypothyroidism, psoriasis, gout, hyperlipidemia, hiatal hernia, colon resection for diverticulitis in 2002 with colostomy, prior bilateral TKRs. Dr. Doylene Canard is her cardiologist/PCP--records re-requested today.  Her EKG on 12/16/11 showed NSR, possible LAE.   Cardiac cath on 09/01/06 (done after abnormal stress test on 08/26/06 showing mild to moderate anterior/anterolateral ischemia versus breast attenuation, EF 77%) showed: LV showed good LV systolic function, EF of 81% to 60%. Left main has 15% to 20% distal stenosis. LAD has 5% to 10% ostial stenosis and 20% to 25% proximal stenosis. Diagonal 1 and 2 were very small. Left circumflex has 20% to 30% proximal stenosis. OM1 was moderate size  which has 20% to 25% mid-stenosis. OM-2 and OM III were very small, which were patent. RCA was small, nondominant which was patent.   Chest x-ray on 12/30/2010 showed no active disease. Mild elevation right hemidiaphragm. Stable bilateral basilar atelectasis or scarring. No acute infiltrate or pulmonary edema.  Labs noted.  Further Anesthesia follow-up once records received from Dr. Doylene Canard.  Myra Gianotti, PA-C 12/18/11 1537  Addendum: 12/22/11 0945 I reviewed the last office note from Dr. Doylene Canard on 12/02/11.  Continued weight reduction was recommended, but otherwise no change in therapy.    Her recent EKG showed no acute ST/T wave changes, she had only mild CAD by cath in 2008, she tolerated cataract extraction within the past year, and she has seen her Cardiologist within the past month.  If no  new CV symptoms then anticipate she can proceed as planned.

## 2011-12-24 ENCOUNTER — Encounter (HOSPITAL_COMMUNITY): Payer: Self-pay | Admitting: Vascular Surgery

## 2011-12-24 ENCOUNTER — Ambulatory Visit (HOSPITAL_COMMUNITY): Payer: Medicaid Other | Admitting: Vascular Surgery

## 2011-12-24 ENCOUNTER — Encounter (HOSPITAL_COMMUNITY)
Admission: RE | Disposition: A | Discharge status: Home / Self Care | Payer: Self-pay | Source: Ambulatory Visit | Attending: Ophthalmology

## 2011-12-24 ENCOUNTER — Encounter (HOSPITAL_COMMUNITY): Payer: Self-pay | Admitting: *Deleted

## 2011-12-24 ENCOUNTER — Ambulatory Visit (HOSPITAL_COMMUNITY)
Admission: RE | Admit: 2011-12-24 | Discharge: 2011-12-24 | Disposition: A | Discharge status: Home / Self Care | Payer: Medicaid Other | Source: Ambulatory Visit | Attending: Ophthalmology | Admitting: Ophthalmology

## 2011-12-24 DIAGNOSIS — H2510 Age-related nuclear cataract, unspecified eye: Secondary | ICD-10-CM | POA: Insufficient documentation

## 2011-12-24 DIAGNOSIS — K449 Diaphragmatic hernia without obstruction or gangrene: Secondary | ICD-10-CM | POA: Insufficient documentation

## 2011-12-24 DIAGNOSIS — E785 Hyperlipidemia, unspecified: Secondary | ICD-10-CM | POA: Insufficient documentation

## 2011-12-24 DIAGNOSIS — M069 Rheumatoid arthritis, unspecified: Secondary | ICD-10-CM | POA: Insufficient documentation

## 2011-12-24 DIAGNOSIS — M129 Arthropathy, unspecified: Secondary | ICD-10-CM | POA: Insufficient documentation

## 2011-12-24 DIAGNOSIS — L408 Other psoriasis: Secondary | ICD-10-CM | POA: Insufficient documentation

## 2011-12-24 DIAGNOSIS — I10 Essential (primary) hypertension: Secondary | ICD-10-CM | POA: Insufficient documentation

## 2011-12-24 DIAGNOSIS — I2510 Atherosclerotic heart disease of native coronary artery without angina pectoris: Secondary | ICD-10-CM | POA: Insufficient documentation

## 2011-12-24 DIAGNOSIS — Z96659 Presence of unspecified artificial knee joint: Secondary | ICD-10-CM | POA: Insufficient documentation

## 2011-12-24 DIAGNOSIS — E119 Type 2 diabetes mellitus without complications: Secondary | ICD-10-CM | POA: Insufficient documentation

## 2011-12-24 DIAGNOSIS — Z90710 Acquired absence of both cervix and uterus: Secondary | ICD-10-CM | POA: Insufficient documentation

## 2011-12-24 DIAGNOSIS — Z87891 Personal history of nicotine dependence: Secondary | ICD-10-CM | POA: Insufficient documentation

## 2011-12-24 HISTORY — PX: CATARACT EXTRACTION W/PHACO: SHX586

## 2011-12-24 SURGERY — PHACOEMULSIFICATION, CATARACT, WITH IOL INSERTION
Anesthesia: Monitor Anesthesia Care | Site: Eye | Laterality: Right | Wound class: Clean

## 2011-12-24 MED ORDER — TETRACAINE HCL 0.5 % OP SOLN
2.0000 [drp] | OPHTHALMIC | Status: AC
  Administered 2011-12-24: 2 [drp] via OPHTHALMIC
  Filled 2011-12-24: qty 2

## 2011-12-24 MED ORDER — BSS IO SOLN
INTRAOCULAR | Status: AC
  Filled 2011-12-24: qty 500

## 2011-12-24 MED ORDER — HYPROMELLOSE (GONIOSCOPIC) 2.5 % OP SOLN
OPHTHALMIC | Status: DC | PRN
  Administered 2011-12-24: 2 [drp] via OPHTHALMIC

## 2011-12-24 MED ORDER — LIDOCAINE HCL (CARDIAC) 20 MG/ML IV SOLN
INTRAVENOUS | Status: DC | PRN
  Administered 2011-12-24: 20 mg via INTRAVENOUS

## 2011-12-24 MED ORDER — LIDOCAINE HCL (PF) 2 % IJ SOLN
INTRAMUSCULAR | Status: DC | PRN
  Administered 2011-12-24: 10 mL

## 2011-12-24 MED ORDER — EPINEPHRINE HCL 1 MG/ML IJ SOLN
INTRAMUSCULAR | Status: AC
  Filled 2011-12-24: qty 1

## 2011-12-24 MED ORDER — PROVISC 10 MG/ML IO SOLN
INTRAOCULAR | Status: DC | PRN
  Administered 2011-12-24: .85 mL via INTRAOCULAR

## 2011-12-24 MED ORDER — MIDAZOLAM HCL 5 MG/5ML IJ SOLN
INTRAMUSCULAR | Status: DC | PRN
  Administered 2011-12-24: 1 mg via INTRAVENOUS

## 2011-12-24 MED ORDER — CEFAZOLIN SUBCONJUNCTIVAL INJECTION 100 MG/0.5 ML
200.0000 mg | INJECTION | Freq: Once | SUBCONJUNCTIVAL | Status: AC
  Administered 2011-12-24: 200 mg via SUBCONJUNCTIVAL
  Filled 2011-12-24: qty 1

## 2011-12-24 MED ORDER — SODIUM CHLORIDE 0.9 % IV SOLN
INTRAVENOUS | Status: DC
  Administered 2011-12-24: 10:00:00 via INTRAVENOUS

## 2011-12-24 MED ORDER — BACITRACIN-POLYMYXIN B 500-10000 UNIT/GM OP OINT
TOPICAL_OINTMENT | OPHTHALMIC | Status: AC
  Filled 2011-12-24: qty 3.5

## 2011-12-24 MED ORDER — PROPOFOL 10 MG/ML IV BOLUS
INTRAVENOUS | Status: DC | PRN
  Administered 2011-12-24: 30 mg via INTRAVENOUS

## 2011-12-24 MED ORDER — NA CHONDROIT SULF-NA HYALURON 40-30 MG/ML IO SOLN
INTRAOCULAR | Status: DC | PRN
  Administered 2011-12-24: 0.5 mL via INTRAOCULAR

## 2011-12-24 MED ORDER — BACITRACIN-POLYMYXIN B 500-10000 UNIT/GM OP OINT
TOPICAL_OINTMENT | OPHTHALMIC | Status: DC | PRN
  Administered 2011-12-24: 1 via OPHTHALMIC

## 2011-12-24 MED ORDER — HYPROMELLOSE (GONIOSCOPIC) 2.5 % OP SOLN
OPHTHALMIC | Status: AC
  Filled 2011-12-24: qty 15

## 2011-12-24 MED ORDER — DEXAMETHASONE SODIUM PHOSPHATE 10 MG/ML IJ SOLN
INTRAMUSCULAR | Status: DC | PRN
  Administered 2011-12-24: 10 mg via INTRAVENOUS

## 2011-12-24 MED ORDER — BUPIVACAINE HCL (PF) 0.75 % IJ SOLN
INTRAMUSCULAR | Status: AC
  Filled 2011-12-24: qty 10

## 2011-12-24 MED ORDER — FENTANYL CITRATE 0.05 MG/ML IJ SOLN
INTRAMUSCULAR | Status: DC | PRN
  Administered 2011-12-24: 25 ug via INTRAVENOUS

## 2011-12-24 MED ORDER — EPINEPHRINE HCL 1 MG/ML IJ SOLN
INTRAOCULAR | Status: DC | PRN
  Administered 2011-12-24: 11:00:00

## 2011-12-24 MED ORDER — ACETAZOLAMIDE SODIUM 500 MG IJ SOLR
INTRAMUSCULAR | Status: AC
  Filled 2011-12-24: qty 500

## 2011-12-24 MED ORDER — DEXAMETHASONE SODIUM PHOSPHATE 10 MG/ML IJ SOLN
INTRAMUSCULAR | Status: AC
  Filled 2011-12-24: qty 1

## 2011-12-24 MED ORDER — GATIFLOXACIN 0.5 % OP SOLN
1.0000 [drp] | OPHTHALMIC | Status: AC | PRN
  Administered 2011-12-24 (×3): 1 [drp] via OPHTHALMIC
  Filled 2011-12-24: qty 2.5

## 2011-12-24 MED ORDER — ONDANSETRON HCL 4 MG/2ML IJ SOLN
INTRAMUSCULAR | Status: DC | PRN
  Administered 2011-12-24: 4 mg via INTRAVENOUS

## 2011-12-24 MED ORDER — PHENYLEPHRINE HCL 2.5 % OP SOLN
1.0000 [drp] | OPHTHALMIC | Status: AC | PRN
  Administered 2011-12-24 (×3): 1 [drp] via OPHTHALMIC
  Filled 2011-12-24: qty 3

## 2011-12-24 MED ORDER — TRIAMCINOLONE ACETONIDE 40 MG/ML IJ SUSP
INTRAMUSCULAR | Status: AC
  Filled 2011-12-24: qty 1

## 2011-12-24 MED ORDER — PREDNISOLONE ACETATE 1 % OP SUSP
1.0000 [drp] | OPHTHALMIC | Status: AC
  Administered 2011-12-24: 1 [drp] via OPHTHALMIC
  Filled 2011-12-24: qty 5

## 2011-12-24 MED ORDER — NA CHONDROIT SULF-NA HYALURON 40-30 MG/ML IO SOLN
INTRAOCULAR | Status: AC
  Filled 2011-12-24: qty 0.5

## 2011-12-24 MED ORDER — LIDOCAINE HCL 2 % IJ SOLN
INTRAMUSCULAR | Status: AC
  Filled 2011-12-24: qty 20

## 2011-12-24 MED ORDER — SODIUM CHLORIDE 0.9 % IV SOLN
INTRAVENOUS | Status: DC | PRN
  Administered 2011-12-24: 10:00:00 via INTRAVENOUS

## 2011-12-24 MED ORDER — BUPIVACAINE HCL (PF) 0.75 % IJ SOLN
INTRAMUSCULAR | Status: DC | PRN
  Administered 2011-12-24: 10 mL

## 2011-12-24 MED ORDER — SODIUM HYALURONATE 10 MG/ML IO SOLN
INTRAOCULAR | Status: AC
  Filled 2011-12-24: qty 0.85

## 2011-12-24 MED ORDER — WATER FOR IRRIGATION, STERILE IR SOLN
Status: DC | PRN
  Administered 2011-12-24: 1000 mL via SURGICAL_CAVITY

## 2011-12-24 SURGICAL SUPPLY — 57 items
APPLICATOR COTTON TIP 6IN STRL (MISCELLANEOUS) ×2 IMPLANT
APPLICATOR DR MATTHEWS STRL (MISCELLANEOUS) ×2 IMPLANT
BAG MINI COLL DRAIN (WOUND CARE) ×2 IMPLANT
BLADE EYE MINI 60D BEAVER (BLADE) IMPLANT
BLADE KERATOME 2.75 (BLADE) ×2 IMPLANT
BLADE STAB KNIFE 15DEG (BLADE) IMPLANT
CANNULA ANTERIOR CHAMBER 27GA (MISCELLANEOUS) IMPLANT
CLOTH BEACON ORANGE TIMEOUT ST (SAFETY) ×2 IMPLANT
DRAPE OPHTHALMIC 77X100 STRL (CUSTOM PROCEDURE TRAY) ×2 IMPLANT
DRAPE POUCH INSTRU U-SHP 10X18 (DRAPES) ×2 IMPLANT
DRSG TEGADERM 4X4.75 (GAUZE/BANDAGES/DRESSINGS) ×2 IMPLANT
FILTER BLUE MILLIPORE (MISCELLANEOUS) IMPLANT
GLOVE SS BIOGEL STRL SZ 6.5 (GLOVE) ×1 IMPLANT
GLOVE SUPERSENSE BIOGEL SZ 6.5 (GLOVE) ×1
GOWN SRG XL XLNG 56XLVL 4 (GOWN DISPOSABLE) ×1 IMPLANT
GOWN STRL NON-REIN LRG LVL3 (GOWN DISPOSABLE) ×2 IMPLANT
GOWN STRL NON-REIN XL XLG LVL4 (GOWN DISPOSABLE) ×1
KIT BASIN OR (CUSTOM PROCEDURE TRAY) ×2 IMPLANT
KIT ROOM TURNOVER OR (KITS) IMPLANT
KNIFE GRIESHABER SHARP 2.5MM (MISCELLANEOUS) ×2 IMPLANT
LENS INTRAOCULAR MA50BM 20.0 (Intraocular Lens) ×2 IMPLANT
MASK EYE SHIELD (GAUZE/BANDAGES/DRESSINGS) ×2 IMPLANT
NEEDLE 18GX1X1/2 (RX/OR ONLY) (NEEDLE) ×2 IMPLANT
NEEDLE 22X1 1/2 (OR ONLY) (NEEDLE) ×2 IMPLANT
NEEDLE 25GX 5/8IN NON SAFETY (NEEDLE) ×4 IMPLANT
NEEDLE FILTER BLUNT 18X 1/2SAF (NEEDLE) ×1
NEEDLE FILTER BLUNT 18X1 1/2 (NEEDLE) ×1 IMPLANT
NEEDLE HYPO 30X.5 LL (NEEDLE) ×4 IMPLANT
NS IRRIG 1000ML POUR BTL (IV SOLUTION) ×2 IMPLANT
PACK CATARACT CUSTOM (CUSTOM PROCEDURE TRAY) ×2 IMPLANT
PACK CATARACT MCHSCP (PACKS) ×2 IMPLANT
PACK COMBINED CATERACT/VIT 23G (OPHTHALMIC RELATED) IMPLANT
PAD ARMBOARD 7.5X6 YLW CONV (MISCELLANEOUS) ×4 IMPLANT
PAD EYE OVAL STERILE LF (GAUZE/BANDAGES/DRESSINGS) ×2 IMPLANT
PHACO TIP KELMAN 45DEG (TIP) ×2 IMPLANT
PROBE ANTERIOR 20G W/INFUS NDL (MISCELLANEOUS) IMPLANT
RING MALYGIN (MISCELLANEOUS) IMPLANT
ROLLS DENTAL (MISCELLANEOUS) IMPLANT
SHUTTLE MONARCH TYPE A (NEEDLE) ×2 IMPLANT
SOLUTION ANTI FOG 6CC (MISCELLANEOUS) ×2 IMPLANT
SPEAR EYE SURG WECK-CEL (MISCELLANEOUS) ×2 IMPLANT
SUT ETHILON 10-0 CS-B-6CS-B-6 (SUTURE)
SUT ETHILON 5 0 P 3 18 (SUTURE)
SUT ETHILON 9 0 TG140 8 (SUTURE) IMPLANT
SUT NYLON ETHILON 5-0 P-3 1X18 (SUTURE) IMPLANT
SUT PLAIN 6 0 TG1408 (SUTURE) IMPLANT
SUT POLY NON ABSORB 10-0 8 STR (SUTURE) IMPLANT
SUT VICRYL 6 0 S 29 12 (SUTURE) IMPLANT
SUTURE EHLN 10-0 CS-B-6CS-B-6 (SUTURE) IMPLANT
SYR 20CC LL (SYRINGE) IMPLANT
SYR 5ML LL (SYRINGE) IMPLANT
SYR TB 1ML LUER SLIP (SYRINGE) ×2 IMPLANT
SYRINGE 10CC LL (SYRINGE) IMPLANT
TAPE PAPER MEDFIX 1IN X 10YD (GAUZE/BANDAGES/DRESSINGS) ×2 IMPLANT
TOWEL OR 17X24 6PK STRL BLUE (TOWEL DISPOSABLE) ×4 IMPLANT
WATER STERILE IRR 1000ML POUR (IV SOLUTION) ×2 IMPLANT
WIPE INSTRUMENT VISIWIPE 73X73 (MISCELLANEOUS) ×2 IMPLANT

## 2011-12-24 NOTE — Transfer of Care (Signed)
Immediate Anesthesia Transfer of Care Note  Patient: [name]  Procedure(s) Performed: Procedure(s) (LRB) with comments: CATARACT EXTRACTION PHACO AND INTRAOCULAR LENS PLACEMENT ([facility]) (Right)  Patient Location: Short Stay  Anesthesia Type:MAC  Level of Consciousness: awake, alert , oriented and patient cooperative  Airway & Oxygen Therapy: Patient Spontanous Breathing  Post-op Assessment: Report given to PACU RN and Post -op Vital signs reviewed and stable  Post vital signs: Reviewed and stable  Complications: No apparent anesthesia complications

## 2011-12-24 NOTE — Op Note (Signed)
[place] Redditt Elsayed 12/24/2011 Cataract: Combined, Nuclear  Procedure: Phacoemulsification, Posterior Chamber Intra-ocular Lens Operative Eye:  right eye  Surgeon: [name] Estimated Blood Loss: minimal Specimens for Pathology:  None Complications: none  The patient was prepared and draped in the usual manner for ocular surgery on the right eye. A Cook lid speculum was placed. A peripheral clear corneal incision was made at the surgical limbus centered at the 11:00 meridian. A separate clear corneal stab incision was made with a 15 degree blade at the 2:00 meridian to permit bi-manual technique. Viscoat and  Provisc as an underlying layer next to the capsule was instilled into the anterior chamber through that incision.  A keratome was used to create a self sealing incision entering the anterior chamber at the 11:00 meridian. A capsulorhexis was performed using a bent 25g needle. The lens was hydrodissected and the nucleus was hydrodilineated using a Nichammin cannula. The Chang chopper was inserted and used to rotate the lens to insure adequate lens mobility. The phacoemulsification handpiece was inserted and a combined phaco-chop technique was employed, fracturing the lens into separate sections with subsequent removal with the phaco handpiece.   The I/A cannula was used to remove remaining lens cortex. Provisc was instilled and used to deepen the anterior chamber and posterior capsule bag. The Monarch injector was used to place a folded Acrysof MA50BM PC IOL, + 20.00  diopters, into the capsule bag. A Sinskey lens hook was used to dial in the trailing haptic. One of the haptics dislodged during rotation and was removed. In that is is a 45m optic it centered well and entirely encompasses the visual axis and as such was left in place.  The I/A cannula was used to remove the viscoelastic from the anterior chamber. BSS was used to bring IOP to the desired range and the wound was checked to insure it was  watertight. Subconjunctival injections of Ancef 100/0.564mand Dexamethasone 0.5 ml of a 1032mml solution were placed without complication. The lid speculum and drapes were removed and the patient's eye was patched with Polymixin/Bacitracin ophthalmic ointment. An eye shield was placed and the patient was transferred alert and conversant from the operating room to the post-operative recovery area.   GEIAdonis BrookD

## 2011-12-24 NOTE — Preoperative (Signed)
Beta Blockers   Reason not to administer Beta Blockers:Not Applicable

## 2011-12-24 NOTE — Anesthesia Postprocedure Evaluation (Signed)
  Anesthesia Post-op Note  Patient: [name]  Procedure(s) Performed: Procedure(s) (LRB) with comments: CATARACT EXTRACTION PHACO AND INTRAOCULAR LENS PLACEMENT ([facility]) (Right)  Patient Location: Short Stay  Anesthesia Type:MAC  Level of Consciousness: awake, alert , oriented and patient cooperative  Airway and Oxygen Therapy: Patient Spontanous Breathing  Post-op Pain: none  Post-op Assessment: Post-op Vital signs reviewed, Patient's Cardiovascular Status Stable, Respiratory Function Stable, Patent Airway and No signs of Nausea or vomiting  Post-op Vital Signs: Reviewed and stable  Complications: No apparent anesthesia complications

## 2011-12-24 NOTE — H&P (View-Only) (Signed)
Pre-operative History and Physical for Ophthalmic Surgery  [name] 11/24/2011                  Chief Complaint: Decreased  Vision Right Eye  Diagnosis:  Combined Cataract  No Known Allergies   Prior to Admission medications   Medication Sig Start Date End Date Taking? Authorizing Provider  Carboxymethylcellul-Glycerin (OPTIVE OP) Place 1 drop into both eyes daily as needed. For dry eyes     Historical Provider, MD  cycloSPORINE (RESTASIS) 0.05 % ophthalmic emulsion Place 1 drop into both eyes 2 (two) times daily.      Historical Provider, MD  etodolac (LODINE) 400 MG tablet Take 400 mg by mouth 2 (two) times daily.      Historical Provider, MD  folic acid (FOLVITE) 1 MG tablet Take 1 mg by mouth daily.      Historical Provider, MD  furosemide (LASIX) 20 MG tablet Take 20 mg by mouth daily.      Historical Provider, MD  levothyroxine (SYNTHROID, LEVOTHROID) 50 MCG tablet Take 50 mcg by mouth daily.      Historical Provider, MD  methotrexate (RHEUMATREX) 2.5 MG tablet Take 10 mg by mouth 2 (two) times a week. Take 4 tablets on Saturday & Sunday. Caution:Chemotherapy. Protect from light.     Historical Provider, MD  olmesartan (BENICAR) 20 MG tablet Take 10 mg by mouth daily.      Historical Provider, MD  simvastatin (ZOCOR) 40 MG tablet Take 40 mg by mouth at bedtime.      Historical Provider, MD    Planned Procedure:                                       Phacoemulsification, Posterior Chamber Intra-ocular Lens Right Eye   There were no vitals filed for this visit.  Pulse: 80         Temp: NE        Resp:  17      ROS: some history of shortness of breath related weight  Past Medical History  Diagnosis Date  . Arthritis   . Diverticular disease   . Colostomy care 2004  . Diabetes mellitus   . Coronary artery disease   . Thyroid disease   . Hypertension     takes Benicar daily  . Hyperlipidemia     takes Zocor daily  . Bronchitis     recent diagnosis  . Cough    d/t bronchitis  . Shortness of breath     with exertion  . Vertigo   . Rheumatoid arthritis   . Gout     hx of  . Abdominal hernia   . Diverticulitis     portion of colon removed and pt has a colostomy since 2002-attempted in 2005 to reverse it and not successful  . Urinary frequency     d/t being on Lasix;pt was having a lot of fluid in lower legs  . Psoriasis   . Blood transfusion     yrs ago  . Hypothyroidism     takes Synthroid daily  . Cataracts, bilateral     Past Surgical History  Procedure Date  . Abdominal hysterectomy 1965  . Total knee arthroplasty 2005/2008    bilateral  . Knee arthroscopy     bilateral  . Colon surgery 2002    d/t diverticulosis  . Colostomy   .  Tonsillectomy   . Cataract extraction w/phaco 01/01/2011    Procedure: CATARACT EXTRACTION PHACO AND INTRAOCULAR LENS PLACEMENT ([facility]);  Surgeon: [name], MD;  Location: [place];  Service: Ophthalmology;  Laterality: Left;     History   Social History  . Marital Status: Single    Spouse Name: N/A    Number of Children: N/A  . Years of Education: N/A   Occupational History  . Not on file.   Social History Main Topics  . Smoking status: Former Research scientist (life sciences)  . Smokeless tobacco: Not on file  . Alcohol Use: No  . Drug Use: No  . Sexually Active: No   Other Topics Concern  . Not on file   Social History Narrative  . No narrative on file     The following examination is for anesthesia clearance for minimally invasive Ophthalmic surgery. It is primarily to document heart and lung findings and is not intended to elucidate unknown general medical conditions inclusive of abdominal masses, lung lesions, etc.   General Constitution:  within normal limits   Alertness/Orientation:  Person, time place     yes   HEENT:  Eye Findings:  Combined Cataract                   right eye  Neck: supple without masses  Chest/Lungs: clear to auscultation  Cardiac: Normal S1 and S2 without Murmur, S3 or  S4  Neuro: non-focal  Impression:  Combined Cataract OD Planned Procedure:  Phacoemulsification, Posterior Chamber Intraocular Lens OD    [name], MD

## 2011-12-24 NOTE — Interval H&P Note (Signed)
History and Physical Interval Note:  12/24/2011 10:10 AM  [name]  has presented today for surgery, with the diagnosis of Combined Cataract Right Eye  The various methods of treatment have been discussed with the patient and family. After consideration of risks, benefits and other options for treatment, the patient has consented to  Procedure(s) (LRB) with comments: CATARACT EXTRACTION PHACO AND INTRAOCULAR LENS PLACEMENT ([facility]) (Right) as a surgical intervention .  The patient's history has been reviewed, patient examined, no change in status, stable for surgery.  I have reviewed the patient's chart and labs.  Questions were answered to the patient's satisfaction.     [name], MD

## 2011-12-24 NOTE — Anesthesia Preprocedure Evaluation (Addendum)
Anesthesia Evaluation  Patient identified by MRN, date of birth, ID band Patient awake    Reviewed: Allergy & Precautions, H&P , NPO status , Patient's Chart, lab work & pertinent test results  Airway Mallampati: II TM Distance: >3 FB Neck ROM: Full    Dental  (+) Poor Dentition, Dental Advisory Given and Teeth Intact   Pulmonary neg pulmonary ROS, shortness of breath and with exertion, former smoker,  breath sounds clear to auscultation  Pulmonary exam normal       Cardiovascular hypertension, Pt. on medications + CAD Rhythm:Regular Rate:Normal     Neuro/Psych negative neurological ROS     GI/Hepatic hiatal hernia,   Endo/Other  diabetesHypothyroidism   Renal/GU      Musculoskeletal  (+) Arthritis -, Rheumatoid disorders,    Abdominal   Peds  Hematology   Anesthesia Other Findings   Reproductive/Obstetrics                          Anesthesia Physical Anesthesia Plan  ASA: III  Anesthesia Plan: MAC   Post-op Pain Management:    Induction: Intravenous  Airway Management Planned: Simple Face Mask  Additional Equipment:   Intra-op Plan:   Post-operative Plan:   Informed Consent: I have reviewed the patients History and Physical, chart, labs and discussed the procedure including the risks, benefits and alternatives for the proposed anesthesia with the patient or authorized representative who has indicated his/her understanding and acceptance.   Dental advisory given  Plan Discussed with: CRNA, Anesthesiologist and Surgeon  Anesthesia Plan Comments:         Anesthesia Quick Evaluation

## 2011-12-29 ENCOUNTER — Encounter (HOSPITAL_COMMUNITY): Payer: Self-pay | Admitting: Ophthalmology

## 2012-04-01 ENCOUNTER — Ambulatory Visit (HOSPITAL_COMMUNITY)
Admission: RE | Admit: 2012-04-01 | Discharge: 2012-04-01 | Disposition: A | Discharge status: Home / Self Care | Payer: Medicare Other | Source: Ambulatory Visit | Attending: Cardiovascular Disease | Admitting: Cardiovascular Disease

## 2012-04-01 ENCOUNTER — Other Ambulatory Visit (HOSPITAL_COMMUNITY): Payer: Self-pay | Admitting: Cardiovascular Disease

## 2012-04-01 DIAGNOSIS — M79609 Pain in unspecified limb: Secondary | ICD-10-CM | POA: Insufficient documentation

## 2012-05-27 ENCOUNTER — Encounter (HOSPITAL_COMMUNITY): Payer: Self-pay | Admitting: Pharmacy Technician

## 2012-06-01 ENCOUNTER — Encounter: Payer: Self-pay | Admitting: Ophthalmology

## 2012-06-01 ENCOUNTER — Ambulatory Visit (HOSPITAL_COMMUNITY)
Admission: RE | Admit: 2012-06-01 | Discharge: 2012-06-01 | Disposition: A | Discharge status: Home / Self Care | Payer: Medicare Other | Source: Ambulatory Visit | Attending: Ophthalmology | Admitting: Ophthalmology

## 2012-06-01 ENCOUNTER — Other Ambulatory Visit: Payer: Self-pay | Admitting: Ophthalmology

## 2012-06-01 ENCOUNTER — Encounter (HOSPITAL_COMMUNITY)
Admission: RE | Disposition: A | Discharge status: Home / Self Care | Payer: Self-pay | Source: Ambulatory Visit | Attending: Ophthalmology

## 2012-06-01 DIAGNOSIS — E079 Disorder of thyroid, unspecified: Secondary | ICD-10-CM | POA: Insufficient documentation

## 2012-06-01 DIAGNOSIS — H2640 Unspecified secondary cataract: Secondary | ICD-10-CM | POA: Insufficient documentation

## 2012-06-01 DIAGNOSIS — M069 Rheumatoid arthritis, unspecified: Secondary | ICD-10-CM | POA: Insufficient documentation

## 2012-06-01 DIAGNOSIS — Z961 Presence of intraocular lens: Secondary | ICD-10-CM | POA: Insufficient documentation

## 2012-06-01 DIAGNOSIS — I10 Essential (primary) hypertension: Secondary | ICD-10-CM | POA: Insufficient documentation

## 2012-06-01 DIAGNOSIS — H04129 Dry eye syndrome of unspecified lacrimal gland: Secondary | ICD-10-CM | POA: Insufficient documentation

## 2012-06-01 DIAGNOSIS — Z9849 Cataract extraction status, unspecified eye: Secondary | ICD-10-CM | POA: Insufficient documentation

## 2012-06-01 DIAGNOSIS — E7800 Pure hypercholesterolemia, unspecified: Secondary | ICD-10-CM | POA: Insufficient documentation

## 2012-06-01 HISTORY — PX: CAPSULOTOMY: SHX5412

## 2012-06-01 HISTORY — PX: YAG LASER APPLICATION: SHX6189

## 2012-06-01 SURGERY — MINOR CAPSULOTOMY
Anesthesia: LOCAL | Laterality: Bilateral

## 2012-06-01 MED ORDER — APRACLONIDINE HCL 1 % OP SOLN: 1.0000 [drp] | Freq: Once | OPHTHALMIC | Status: DC

## 2012-06-01 MED ORDER — CYCLOPENTOLATE-PHENYLEPHRINE 0.2-1 % OP SOLN
OPHTHALMIC | Status: AC
  Filled 2012-06-01: qty 2

## 2012-06-01 MED ORDER — APRACLONIDINE HCL 0.5 % OP SOLN: 1.0000 [drp] | Freq: Once | OPHTHALMIC | Status: DC

## 2012-06-01 MED ORDER — APRACLONIDINE HCL 0.5 % OP SOLN
OPHTHALMIC | Status: AC
  Filled 2012-06-01: qty 5

## 2012-06-01 MED ORDER — CYCLOPENTOLATE-PHENYLEPHRINE 0.2-1 % OP SOLN
1.0000 [drp] | OPHTHALMIC | Status: AC
  Administered 2012-06-01 (×2): 1 [drp] via OPHTHALMIC
  Filled 2012-06-01: qty 2

## 2012-06-01 MED ORDER — APRACLONIDINE HCL 1 % OP SOLN
1.0000 [drp] | OPHTHALMIC | Status: AC
  Administered 2012-06-01 (×2): 1 [drp] via OPHTHALMIC
  Filled 2012-06-01 (×2): qty 0.1

## 2012-06-01 MED ORDER — CYCLOPENTOLATE-PHENYLEPHRINE 0.2-1 % OP SOLN: 1.0000 [drp] | OPHTHALMIC | Status: AC

## 2012-06-01 MED ORDER — CYCLOPENTOLATE-PHENYLEPHRINE 0.2-1 % OP SOLN: 1.0000 [drp] | OPHTHALMIC | Status: DC

## 2012-06-01 SURGICAL SUPPLY — 27 items
APPLICATOR COTTON TIP 6IN STRL (MISCELLANEOUS) ×2 IMPLANT
BAG MINI COLL DRAIN (WOUND CARE) IMPLANT
BLADE KERATOME 2.75 (BLADE) ×2 IMPLANT
BLADE MINI RND TIP GREEN BEAV (BLADE) IMPLANT
CLOTH BEACON ORANGE TIMEOUT ST (SAFETY) ×2 IMPLANT
CORDS BIPOLAR (ELECTRODE) ×2 IMPLANT
DRAPE OPHTHALMIC 40X48 W POUCH (DRAPES) ×2 IMPLANT
DRAPE RETRACTOR (MISCELLANEOUS) ×2 IMPLANT
GLOVE ECLIPSE 7.0 STRL STRAW (GLOVE) ×2 IMPLANT
GOWN STRL NON-REIN LRG LVL3 (GOWN DISPOSABLE) ×4 IMPLANT
KIT BASIN OR (CUSTOM PROCEDURE TRAY) ×2 IMPLANT
KIT ROOM TURNOVER OR (KITS) ×2 IMPLANT
KNIFE CRESCENT 2.5 55 ANG (BLADE) ×2 IMPLANT
MARKER SKIN DUAL TIP RULER LAB (MISCELLANEOUS) IMPLANT
NS IRRIG 1000ML POUR BTL (IV SOLUTION) ×2 IMPLANT
PACK CATARACT CUSTOM (CUSTOM PROCEDURE TRAY) ×2 IMPLANT
PACK CATARACT MCHSCP (PACKS) IMPLANT
PAD ARMBOARD 7.5X6 YLW CONV (MISCELLANEOUS) ×4 IMPLANT
PROBE ANTERIOR 20G W/INFUS NDL (MISCELLANEOUS) IMPLANT
SPEAR EYE SURG WECK-CEL (MISCELLANEOUS) IMPLANT
SUT ETHILON 10 0 CS140 6 (SUTURE) IMPLANT
SUT VICRYL 8 0 TG140 8 (SUTURE) IMPLANT
SYR 3ML LL SCALE MARK (SYRINGE) IMPLANT
TIP SILICONE STR (MISCELLANEOUS)
TIP SILICONE STR 0.3MM UFLOW (MISCELLANEOUS) IMPLANT
TOWEL OR 17X24 6PK STRL BLUE (TOWEL DISPOSABLE) ×4 IMPLANT
WATER STERILE IRR 1000ML POUR (IV SOLUTION) ×2 IMPLANT

## 2012-06-01 NOTE — H&P (Signed)
  67 yo female has had cataract surgery both eyes.  Complains of blurred vision because of a cloudy posterior capsule both eyes.  Admitted for yag laser capsulotomy both eyes. H & P submitted to medical records to be scanned.

## 2012-06-01 NOTE — Brief Op Note (Signed)
06/01/2012  9:14 AM  PATIENT:  [name]  67 y.o. female  PRE-OPERATIVE DIAGNOSIS:  Opaque Posterior Capsule  POST-OPERATIVE DIAGNOSIS:  * No post-op diagnosis entered *  PROCEDURE:  Procedure(s): MINOR CAPSULOTOMY (Bilateral) YAG LASER APPLICATION (Bilateral)  SURGEON:  Surgeon(s) and Role:    Myrtha Mantis., MD - Primary  PHYSICIAN ASSISTANT:   ASSISTANTS: none   ANESTHESIA:   none  EBL:     BLOOD ADMINISTERED:none  DRAINS: none   LOCAL MEDICATIONS USED:  NONE  SPECIMEN:  No Specimen  DISPOSITION OF SPECIMEN:  N/A  COUNTS:  YES  TOURNIQUET:  * No tourniquets in log *  DICTATION: .Other Dictation: Dictation Number [id number]  PLAN OF CARE: Discharge to home after PACU  PATIENT DISPOSITION:  Short Stay   Delay start of Pharmacological VTE agent (>24hrs) due to surgical blood loss or risk of bleeding: not applicable

## 2012-06-01 NOTE — H&P (Signed)
  67 yo female has had cataract surgery ou.  Has blurred vision due to opaque posterior capsules both eyes.  Admitted for yag laser capsulotomy both eyes. H & P submitted to medical records to be scanned into chart.

## 2012-06-02 ENCOUNTER — Encounter (HOSPITAL_COMMUNITY): Payer: Self-pay | Admitting: Ophthalmology

## 2012-06-03 NOTE — Op Note (Signed)
[name], INNES NO.:  [id number]  MEDICAL RECORD NO.:  [id number]  LOCATION:  [facility]                         FACILITY:  [facility]  PHYSICIAN:  Garlan Fair., M.D.DATE OF BIRTH:  [birth year]  DATE OF PROCEDURE: DATE OF DISCHARGE:  06/01/2012                              OPERATIVE REPORT   PREOPERATIVE DIAGNOSIS:  Opaque posterior capsule, both eyes.  POSTOPERATIVE DIAGNOSIS:  Opaque posterior capsule, both eyes.  OPERATION:  YAG laser capsulotomy.  JUSTIFICATION FOR PROCEDURE:  This is a 67 year old lady who has had bilateral cataract extractions.  She complains of blurring of vision. She was examined and found to have a cloudy posterior capsules of each eye.  YAG laser capsulotomy was recommended.  She is admitted at this time without problems.  [name] was brought to the laser room and positioned appropriately behind the YAG laser.  Attention was first given to the right eye where an 18 applications of laser energy at 2.8 mJ were applied to the posterior capsule obtaining a satisfactory opening.  Attention was then turned to the left eye where 10 applications of laser energy were applied to the posterior capsule. Also obtain a satisfactory opening.  The patient tolerated procedure well and was discharged in satisfactory condition with instructions to see me in office this afternoon for evaluation of intra-ocular pressure.  DISCHARGE DIAGNOSIS:  Opaque posterior capsule, both eyes.     Garlan Fair., M.D.     TB/MEDQ  D:  06/02/2012  T:  06/03/2012  Job:  [id number]

## 2013-02-03 ENCOUNTER — Encounter (HOSPITAL_COMMUNITY): Payer: Self-pay | Admitting: Emergency Medicine

## 2013-02-03 ENCOUNTER — Emergency Department (HOSPITAL_COMMUNITY)
Admission: EM | Admit: 2013-02-03 | Discharge: 2013-02-03 | Disposition: A | Discharge status: Home / Self Care | Payer: Medicare Other | Attending: Emergency Medicine | Admitting: Emergency Medicine

## 2013-02-03 ENCOUNTER — Emergency Department (HOSPITAL_COMMUNITY): Payer: Medicare Other

## 2013-02-03 DIAGNOSIS — E039 Hypothyroidism, unspecified: Secondary | ICD-10-CM | POA: Insufficient documentation

## 2013-02-03 DIAGNOSIS — R071 Chest pain on breathing: Secondary | ICD-10-CM | POA: Insufficient documentation

## 2013-02-03 DIAGNOSIS — E119 Type 2 diabetes mellitus without complications: Secondary | ICD-10-CM | POA: Insufficient documentation

## 2013-02-03 DIAGNOSIS — I10 Essential (primary) hypertension: Secondary | ICD-10-CM | POA: Insufficient documentation

## 2013-02-03 DIAGNOSIS — Z8719 Personal history of other diseases of the digestive system: Secondary | ICD-10-CM | POA: Insufficient documentation

## 2013-02-03 DIAGNOSIS — Z79899 Other long term (current) drug therapy: Secondary | ICD-10-CM | POA: Insufficient documentation

## 2013-02-03 DIAGNOSIS — R509 Fever, unspecified: Secondary | ICD-10-CM | POA: Insufficient documentation

## 2013-02-03 DIAGNOSIS — M129 Arthropathy, unspecified: Secondary | ICD-10-CM | POA: Insufficient documentation

## 2013-02-03 DIAGNOSIS — M069 Rheumatoid arthritis, unspecified: Secondary | ICD-10-CM | POA: Insufficient documentation

## 2013-02-03 DIAGNOSIS — J40 Bronchitis, not specified as acute or chronic: Secondary | ICD-10-CM

## 2013-02-03 DIAGNOSIS — E669 Obesity, unspecified: Secondary | ICD-10-CM | POA: Insufficient documentation

## 2013-02-03 DIAGNOSIS — R062 Wheezing: Secondary | ICD-10-CM | POA: Insufficient documentation

## 2013-02-03 DIAGNOSIS — Z87891 Personal history of nicotine dependence: Secondary | ICD-10-CM | POA: Insufficient documentation

## 2013-02-03 DIAGNOSIS — Z9849 Cataract extraction status, unspecified eye: Secondary | ICD-10-CM | POA: Insufficient documentation

## 2013-02-03 DIAGNOSIS — E079 Disorder of thyroid, unspecified: Secondary | ICD-10-CM | POA: Insufficient documentation

## 2013-02-03 DIAGNOSIS — I2510 Atherosclerotic heart disease of native coronary artery without angina pectoris: Secondary | ICD-10-CM | POA: Insufficient documentation

## 2013-02-03 DIAGNOSIS — E785 Hyperlipidemia, unspecified: Secondary | ICD-10-CM | POA: Insufficient documentation

## 2013-02-03 DIAGNOSIS — J209 Acute bronchitis, unspecified: Secondary | ICD-10-CM | POA: Insufficient documentation

## 2013-02-03 LAB — CBC WITH DIFFERENTIAL/PLATELET
Basophils Absolute: 0 10*3/uL (ref 0.0–0.1)
Basophils Relative: 1 % (ref 0–1)
Eosinophils Absolute: 0.1 10*3/uL (ref 0.0–0.7)
Eosinophils Relative: 2 % (ref 0–5)
HCT: 33.3 % — ABNORMAL LOW (ref 36.0–46.0)
Hemoglobin: 10.3 g/dL — ABNORMAL LOW (ref 12.0–15.0)
Lymphocytes Relative: 22 % (ref 12–46)
Lymphs Abs: 1.6 10*3/uL (ref 0.7–4.0)
MCH: 28.5 pg (ref 26.0–34.0)
MCHC: 30.9 g/dL (ref 30.0–36.0)
MCV: 92.2 fL (ref 78.0–100.0)
Monocytes Absolute: 1.1 10*3/uL — ABNORMAL HIGH (ref 0.1–1.0)
Monocytes Relative: 15 % — ABNORMAL HIGH (ref 3–12)
Neutro Abs: 4.5 10*3/uL (ref 1.7–7.7)
Neutrophils Relative %: 62 % (ref 43–77)
Platelets: 320 10*3/uL (ref 150–400)
RBC: 3.61 MIL/uL — ABNORMAL LOW (ref 3.87–5.11)
RDW: 15.8 % — ABNORMAL HIGH (ref 11.5–15.5)
WBC: 7.3 10*3/uL (ref 4.0–10.5)

## 2013-02-03 LAB — BASIC METABOLIC PANEL
BUN: 15 mg/dL (ref 6–23)
CO2: 24 mEq/L (ref 19–32)
Calcium: 9.9 mg/dL (ref 8.4–10.5)
Chloride: 106 mEq/L (ref 96–112)
Creatinine, Ser: 1.08 mg/dL (ref 0.50–1.10)
GFR calc Af Amer: 61 mL/min — ABNORMAL LOW (ref >90)
GFR calc non Af Amer: 53 mL/min — ABNORMAL LOW (ref >90)
Glucose, Bld: 100 mg/dL — ABNORMAL HIGH (ref 70–99)
Potassium: 4.6 mEq/L (ref 3.7–5.3)
Sodium: 142 mEq/L (ref 137–147)

## 2013-02-03 LAB — PRO B NATRIURETIC PEPTIDE: Pro B Natriuretic peptide (BNP): 550.2 pg/mL — ABNORMAL HIGH (ref 0–125)

## 2013-02-03 LAB — TROPONIN I: Troponin I: <0.3 ng/mL (ref <0.30)

## 2013-02-03 IMAGING — CR DG CHEST 2V
2 series · 2 of 2 positions shown · non-contrast
Comparison: DG CHEST 2 VIEW dated [DATE]

CLINICAL DATA: Cough and shortness of breath for 6 days

EXAM:
CHEST  2 VIEW

[w chest lat]
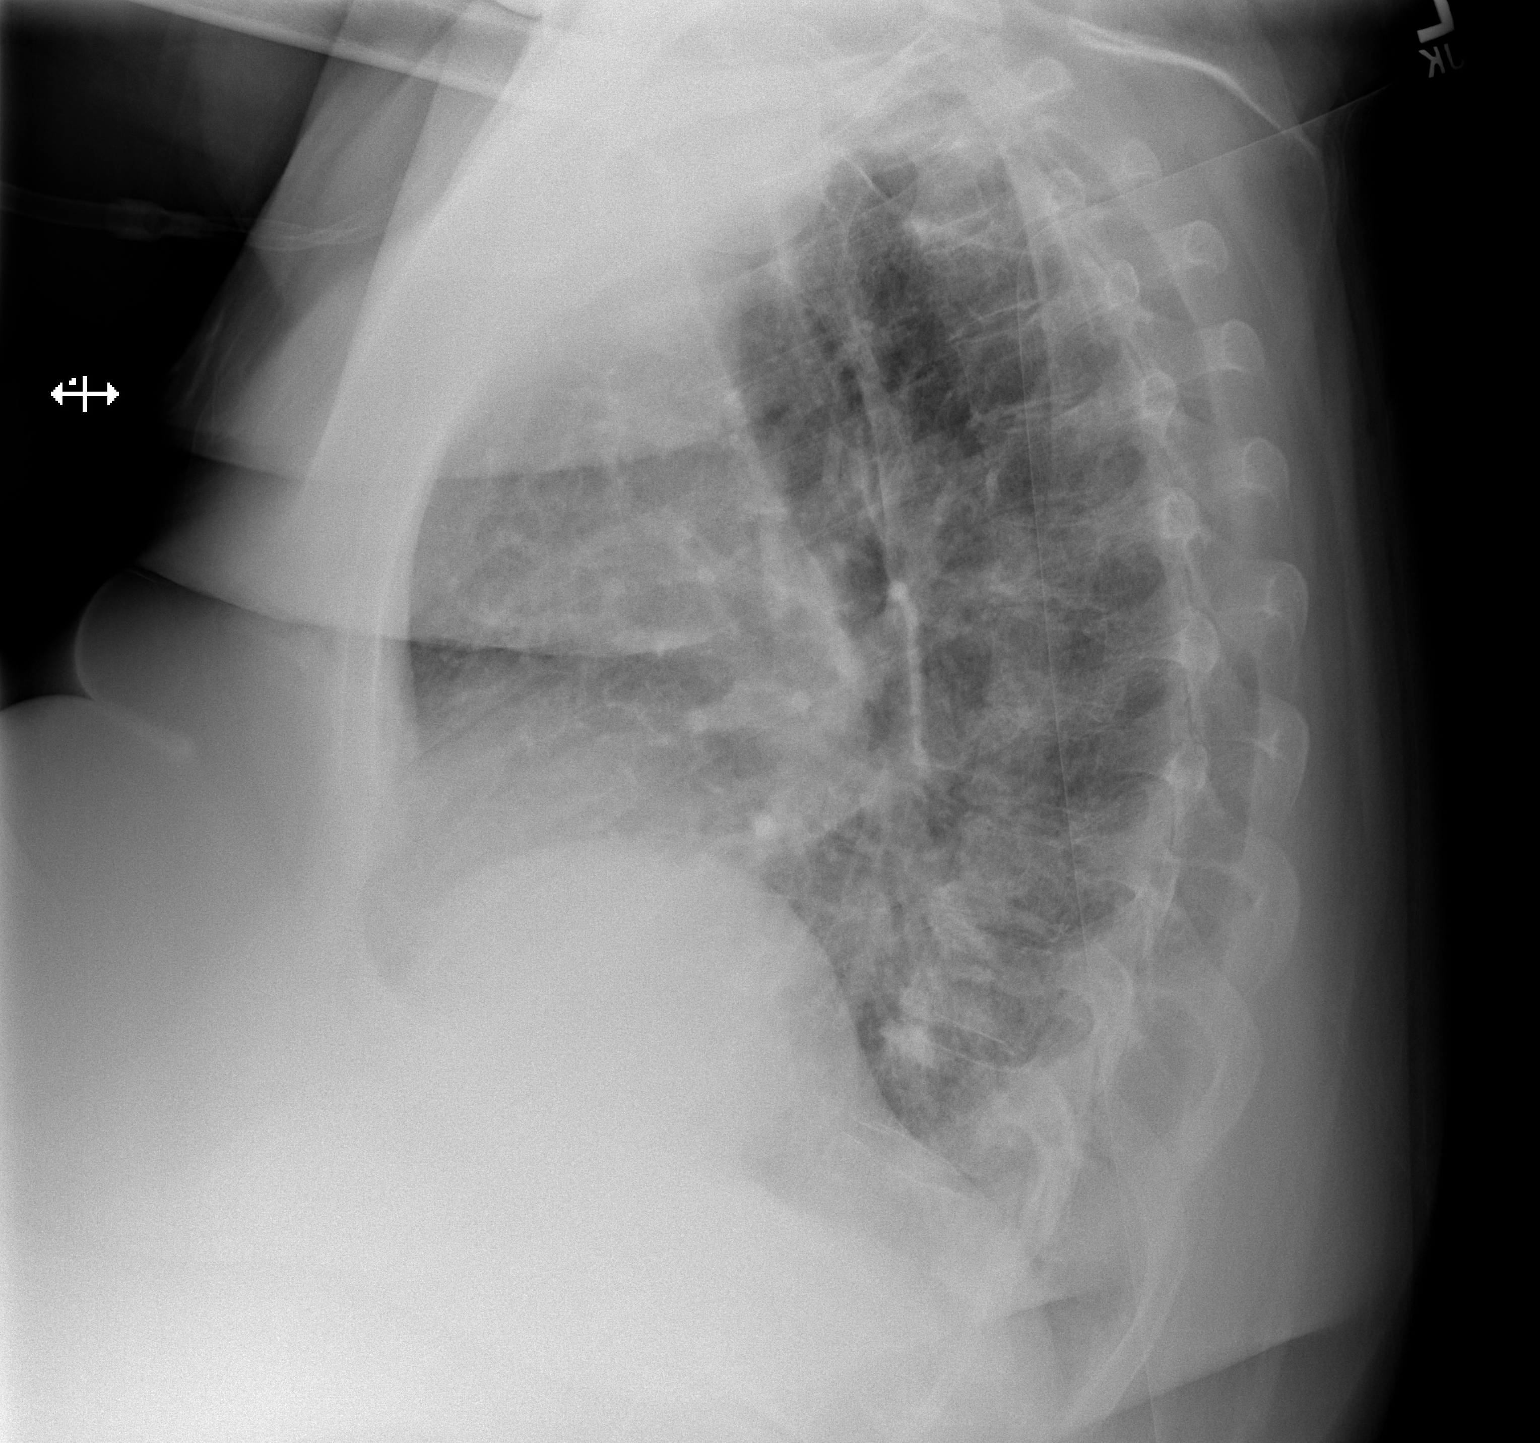

[x chest ap]
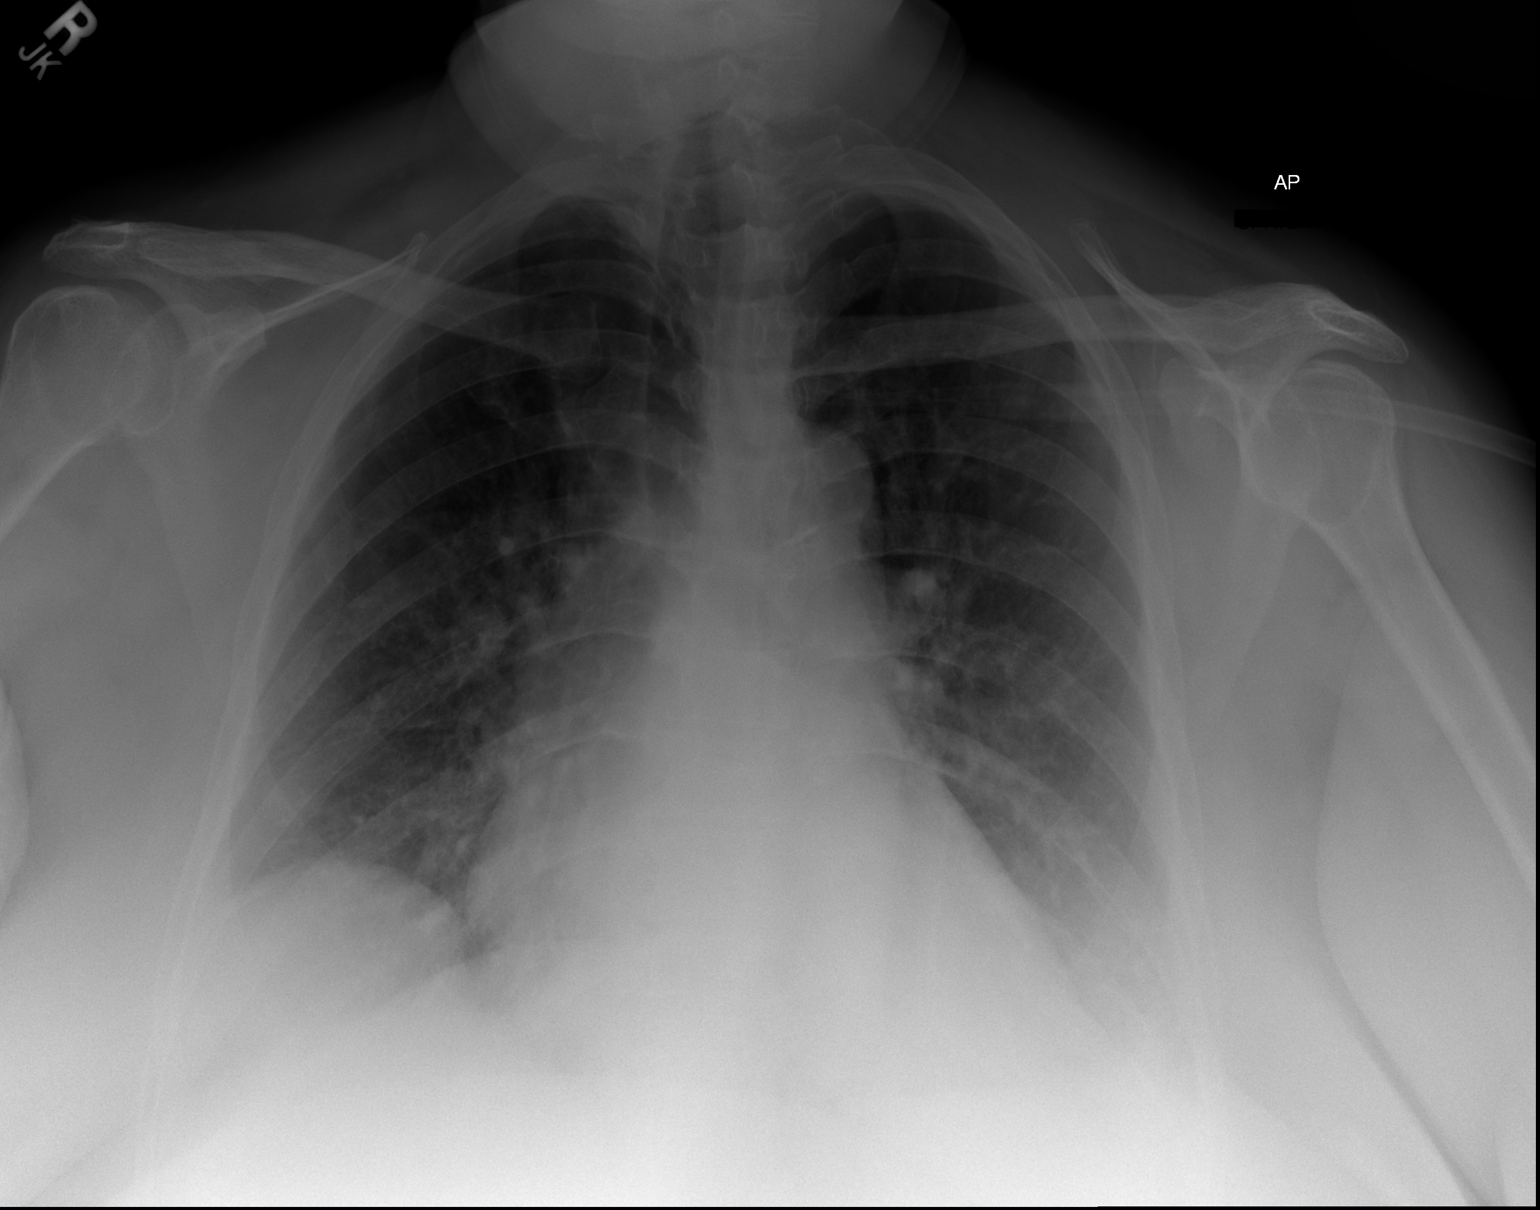

[2 of 2 positions shown; findings below may reference images not displayed]

FINDINGS: Mild degradation secondary to patient body habitus. Lateral view
degraded by patient arm position.

Midline trachea. Patient rotated to the right. Mild cardiomegaly
with atherosclerosis in the transverse aorta. No pleural effusion or
pneumothorax. No congestive failure. Clear lungs.
IMPRESSION: Cardiomegaly, without acute disease.

Aortic atherosclerosis.

Decreased sensitivity and specificity exam due to technique related
factors, as described above.

## 2013-02-03 MED ORDER — PREDNISONE 20 MG PO TABS
40.0000 mg | ORAL_TABLET | Freq: Once | ORAL | Status: AC
  Administered 2013-02-03: 40 mg via ORAL
  Filled 2013-02-03: qty 2

## 2013-02-03 MED ORDER — ALBUTEROL SULFATE HFA 108 (90 BASE) MCG/ACT IN AERS
2.0000 | INHALATION_SPRAY | Freq: Once | RESPIRATORY_TRACT | Status: AC
  Administered 2013-02-03: 2 via RESPIRATORY_TRACT
  Filled 2013-02-03: qty 6.7

## 2013-02-03 MED ORDER — PREDNISONE 20 MG PO TABS: 40.0000 mg | ORAL_TABLET | Freq: Every day | ORAL | Status: DC

## 2013-02-03 NOTE — ED Notes (Signed)
Bed: XN23 Expected date:  Expected time:  Means of arrival:  Comments: EMS-[facility]

## 2013-02-03 NOTE — Discharge Instructions (Signed)
Bronchitis Bronchitis is the body's way of reacting to injury and/or infection (inflammation) of the bronchi. Bronchi are the air tubes that extend from the windpipe into the lungs. If the inflammation becomes severe, it may cause shortness of breath. CAUSES  Inflammation may be caused by:  A virus.  Germs (bacteria).  Dust.  Allergens.  Pollutants and many other irritants. The cells lining the bronchial tree are covered with tiny hairs (cilia). These constantly beat upward, away from the lungs, toward the mouth. This keeps the lungs free of pollutants. When these cells become too irritated and are unable to do their job, mucus begins to develop. This causes the characteristic cough of bronchitis. The cough clears the lungs when the cilia are unable to do their job. Without either of these protective mechanisms, the mucus would settle in the lungs. Then you would develop pneumonia. Smoking is a common cause of bronchitis and can contribute to pneumonia. Stopping this habit is the single most important thing you can do to help yourself. TREATMENT   Your caregiver may prescribe an antibiotic if the cough is caused by bacteria. Also, medicines that open up your airways make it easier to breathe. Your caregiver may also recommend or prescribe an expectorant. It will loosen the mucus to be coughed up. Only take over-the-counter or prescription medicines for pain, discomfort, or fever as directed by your caregiver.  Removing whatever causes the problem (smoking, for example) is critical to preventing the problem from getting worse.  Cough suppressants may be prescribed for relief of cough symptoms.  Inhaled medicines may be prescribed to help with symptoms now and to help prevent problems from returning.  For those with recurrent (chronic) bronchitis, there may be a need for steroid medicines. SEEK IMMEDIATE MEDICAL CARE IF:   During treatment, you develop more pus-like mucus (purulent  sputum).  You have a fever.  You become progressively more ill.  You have increased difficulty breathing, wheezing, or shortness of breath. It is necessary to seek immediate medical care if you are elderly or sick from any other disease. MAKE SURE YOU:   Understand these instructions.  Will watch your condition.  Will get help right away if you are not doing well or get worse. Document Released: 01/13/2005 Document Revised: 09/15/2012 Document Reviewed: 09/07/2012 [facility] Patient Information 2014 [place].

## 2013-02-03 NOTE — ED Notes (Signed)
Per EMS pt woke up with Shob more than normal and right side chest discomfort when she coughs or takes a deep breathe. Pt also has diarrhea for 3 days. Pt states she has yellow phlegm with productive cough. EMS vital signs BP150/90, HR 82, O2 98% on RA but was placed on Mellen for comfort.

## 2013-02-08 NOTE — ED Provider Notes (Signed)
CSN: [id number]     Arrival date & time    History   First MD Initiated Contact with Patient 02/03/13 1021     Chief Complaint  Patient presents with  . Shortness of Breath   (Consider location/radiation/quality/duration/timing/severity/associated sxs/prior Treatment) HPI  68 year old female with shortness of breath. Onset last night. Intermittent sharp right-sided chest pain with deep inspiration and coughing, sneezing or laughing. Cough is productive for yellowish sputum. Subjective fever. No chills. No abdominal pain. No unusual leg pain or swelling. She has not tried taking anything for her presenting symptoms. No intervention prior to arrival.  Past Medical History  Diagnosis Date  . Arthritis   . Diverticular disease   . Colostomy care 2004  . Thyroid disease   . Hyperlipidemia     takes Zocor daily  . Bronchitis     recent diagnosis  . Cough     d/t bronchitis  . Shortness of breath     with exertion  . Vertigo   . Rheumatoid arthritis(714.0)   . Gout     hx of  . Abdominal hernia   . Diverticulitis     portion of colon removed and pt has a colostomy since 2002-attempted in 2005 to reverse it and not successful  . Urinary frequency     d/t being on Lasix;pt was having a lot of fluid in lower legs  . Psoriasis   . Blood transfusion     yrs ago  . Hypothyroidism     takes Synthroid daily  . Cataracts, bilateral   . Coronary artery disease     sees Dr. Doylene Canard, [phone], fax [phone]  . Bronchitis     hx of   . Diabetes mellitus     "borderline"  . H/O hiatal hernia   . Hypertension     patient states sees Dr. Doylene Canard as a primary and cardiac   Past Surgical History  Procedure Laterality Date  . Abdominal hysterectomy  1965  . Total knee arthroplasty  2005/2008    bilateral  . Knee arthroscopy      bilateral  . Colon surgery  2002    d/t diverticulosis  . Colostomy    . Tonsillectomy    . Cataract extraction w/phaco  01/01/2011    Procedure:  CATARACT EXTRACTION PHACO AND INTRAOCULAR LENS PLACEMENT (IOC);  Surgeon: [name], MD;  Location: [facility];  Service: Ophthalmology;  Laterality: Left;  . Cataract extraction w/phaco  12/24/2011    Procedure: CATARACT EXTRACTION PHACO AND INTRAOCULAR LENS PLACEMENT (IOC);  Surgeon: [name], MD;  Location: [facility];  Service: Ophthalmology;  Laterality: Right;  . Capsulotomy Bilateral 06/01/2012    Procedure: MINOR CAPSULOTOMY;  Surgeon: Myrtha Mantis., MD;  Location: [facility];  Service: Ophthalmology;  Laterality: Bilateral;  . Yag laser application Bilateral 08/05/238    Procedure: YAG LASER APPLICATION;  Surgeon: Myrtha Mantis., MD;  Location: [facility];  Service: Ophthalmology;  Laterality: Bilateral;   Family History  Problem Relation Age of Onset  . Anesthesia problems Neg Hx   . Hypotension Neg Hx   . Malignant hyperthermia Neg Hx   . Pseudochol deficiency Neg Hx    History  Substance Use Topics  . Smoking status: Former Research scientist (life sciences)  . Smokeless tobacco: Not on file  . Alcohol Use: No   OB History   Grav Para Term Preterm Abortions TAB SAB Ect Mult Living  Review of Systems  All systems reviewed and negative, other than as noted in HPI.   Allergies  Review of patient's allergies indicates no known allergies.  Home Medications   Current Outpatient Rx  Name  Route  Sig  Dispense  Refill  . acetaminophen-codeine (TYLENOL #3) 300-30 MG per tablet   Oral   Take 1 tablet by mouth every 4 (four) hours as needed for moderate pain.         Marland Kitchen amoxicillin (AMOXIL) 500 MG capsule   Oral   Take 500 mg by mouth 3 (three) times daily.         . colesevelam (WELCHOL) 625 MG tablet   Oral   Take 625 mg by mouth 2 (two) times daily with a meal.         . cycloSPORINE (RESTASIS) 0.05 % ophthalmic emulsion   Both Eyes   Place 1 drop into both eyes 2 (two) times daily.           . ferrous sulfate 325 (65 FE) MG tablet   Oral   Take 325 mg by  mouth daily.         . folic acid (FOLVITE) 1 MG tablet   Oral   Take 1 mg by mouth daily.           . furosemide (LASIX) 20 MG tablet   Oral   Take 20 mg by mouth daily.           Marland Kitchen HYDROcodone-acetaminophen (LORTAB) 7.5-500 MG per tablet   Oral   Take 1 tablet by mouth every 6 (six) hours as needed. For pain         . Loperamide HCl (IMODIUM PO)   Oral   Take 30 mLs by mouth daily as needed. Give after each loose stool.         . prednisoLONE acetate (PRED FORTE) 1 % ophthalmic suspension   Both Eyes   Place 1 drop into both eyes 4 (four) times daily.         Marland Kitchen levothyroxine (SYNTHROID, LEVOTHROID) 50 MCG tablet   Oral   Take 50 mcg by mouth daily.           . methotrexate (RHEUMATREX) 2.5 MG tablet   Oral   Take 10 mg by mouth 2 (two) times a week. Caution:Chemotherapy. Protect from light. Takes 4 tabs on Sat and Sun         . prasugrel (EFFIENT) 10 MG TABS   Oral   Take 10 mg by mouth daily.         . predniSONE (DELTASONE) 20 MG tablet   Oral   Take 2 tablets (40 mg total) by mouth daily.   8 tablet   0   . simvastatin (ZOCOR) 40 MG tablet   Oral   Take 40 mg by mouth at bedtime.            BP 155/73  Pulse 75  Temp(Src) 98.6 F (37 C) (Oral)  Resp 20  SpO2 100% Physical Exam  Nursing note and vitals reviewed. Constitutional: She appears well-developed and well-nourished. No distress.  Sitting in bed. No acute distress. Obese.  HENT:  Head: Normocephalic and atraumatic.  Eyes: Conjunctivae are normal. Right eye exhibits no discharge. Left eye exhibits no discharge.  Neck: Neck supple.  Cardiovascular: Normal rate, regular rhythm and normal heart sounds.  Exam reveals no gallop and no friction rub.   No murmur heard. Pulmonary/Chest: Effort normal. No  respiratory distress. She has wheezes.  Faint expiratory wheezing bilaterally. No Accessory muscle usage. Speaking in complete sentences.  Abdominal: Soft. She exhibits no  distension. There is no tenderness.  Musculoskeletal: She exhibits no edema and no tenderness.  Lower extremities symmetric as compared to each other. No calf tenderness. Negative Homan's. No palpable cords.   Neurological: She is alert.  Skin: Skin is warm and dry.  Psychiatric: She has a normal mood and affect. Her behavior is normal. Thought content normal.    ED Course  Procedures (including critical care time) Labs Review Labs Reviewed  PRO B NATRIURETIC PEPTIDE - Abnormal; Notable for the following:    Pro B Natriuretic peptide (BNP) 550.2 (*)    All other components within normal limits  CBC WITH DIFFERENTIAL - Abnormal; Notable for the following:    RBC 3.61 (*)    Hemoglobin 10.3 (*)    HCT 33.3 (*)    RDW 15.8 (*)    Monocytes Relative 15 (*)    Monocytes Absolute 1.1 (*)    All other components within normal limits  BASIC METABOLIC PANEL - Abnormal; Notable for the following:    Glucose, Bld 100 (*)    GFR calc non Af Amer 53 (*)    GFR calc Af Amer 61 (*)    All other components within normal limits  TROPONIN I   Imaging Review No results found.  EKG Interpretation    Date/Time:  Thursday February 03 2013 11:04:53 EST Ventricular Rate:  70 PR Interval:  175 QRS Duration: 135 QT Interval:  420 QTC Calculation: 453 R Axis:   90 Text Interpretation:  Sinus rhythm Consider left atrial enlargement Right bundle branch block ED PHYSICIAN INTERPRETATION AVAILABLE IN [facility] Confirmed by TEST, RECORD ([id number]) on 02/05/2013 2:57:55 PM            MDM   1. Bronchitis    68 year old female with shortness of breath. Likely viral bronchitis. Wheezing on exam. Chest x-ray without any acute abnormality. No significantly increased work of breathing. Oxygen saturations are good on room air. Plan course of steroids. As needed albuterol inhaler. Return precautions were discussed.    Virgel Manifold, MD 02/08/13 [phone]

## 2013-09-05 ENCOUNTER — Ambulatory Visit (INDEPENDENT_AMBULATORY_CARE_PROVIDER_SITE_OTHER): Payer: Medicare Other

## 2013-09-05 DIAGNOSIS — R209 Unspecified disturbances of skin sensation: Secondary | ICD-10-CM

## 2013-09-05 NOTE — Procedures (Signed)
[facility]  NCS (NERVE CONDUCTION STUDY) REPORT   STUDY DATE: 09/05/13 PATIENT NAME: [name] DOB: 1945/10/16 MRN: [id number]  ORDERING CLINICIAN: Jodell Cipro   TECHNOLOGIST: Laretta Alstrom  ELECTROMYOGRAPHER: Earlean Polka. Penumalli, MD  CLINICAL INFORMATION: 68 year old female with bilateral hand numbness.  FINDINGS: NERVE CONDUCTION STUDY: Bilateral median and ulnar motor responses and F-wave latencies are normal. Bilateral median and ulnar sensory responses are normal.  NEEDLE ELECTROMYOGRAPHY: Needle EMG was not ordered.  IMPRESSION:  This is a normal nerve conduction study.   INTERPRETING PHYSICIAN:  Penni Bombard, MD Certified in Neurology, Neurophysiology and Neuroimaging  [facility] [address] [place], [place] [address] [phone]

## 2013-10-14 ENCOUNTER — Other Ambulatory Visit: Payer: Self-pay | Admitting: Orthopedic Surgery

## 2013-10-14 ENCOUNTER — Ambulatory Visit
Admission: RE | Admit: 2013-10-14 | Discharge: 2013-10-14 | Disposition: A | Discharge status: Home / Self Care | Payer: Medicare Other | Source: Ambulatory Visit | Attending: Orthopedic Surgery | Admitting: Orthopedic Surgery

## 2013-10-14 ENCOUNTER — Encounter (INDEPENDENT_AMBULATORY_CARE_PROVIDER_SITE_OTHER): Payer: Self-pay

## 2013-10-14 DIAGNOSIS — M069 Rheumatoid arthritis, unspecified: Secondary | ICD-10-CM

## 2013-10-14 IMAGING — CR DG WRIST 2V*R*
2 series · 2 of 2 positions shown · non-contrast
Comparison: [DATE]

CLINICAL DATA: Osteoarthritis,  wrist pain for 7 months

EXAM:
RIGHT WRIST - 2 VIEW

[x wrist pa right]
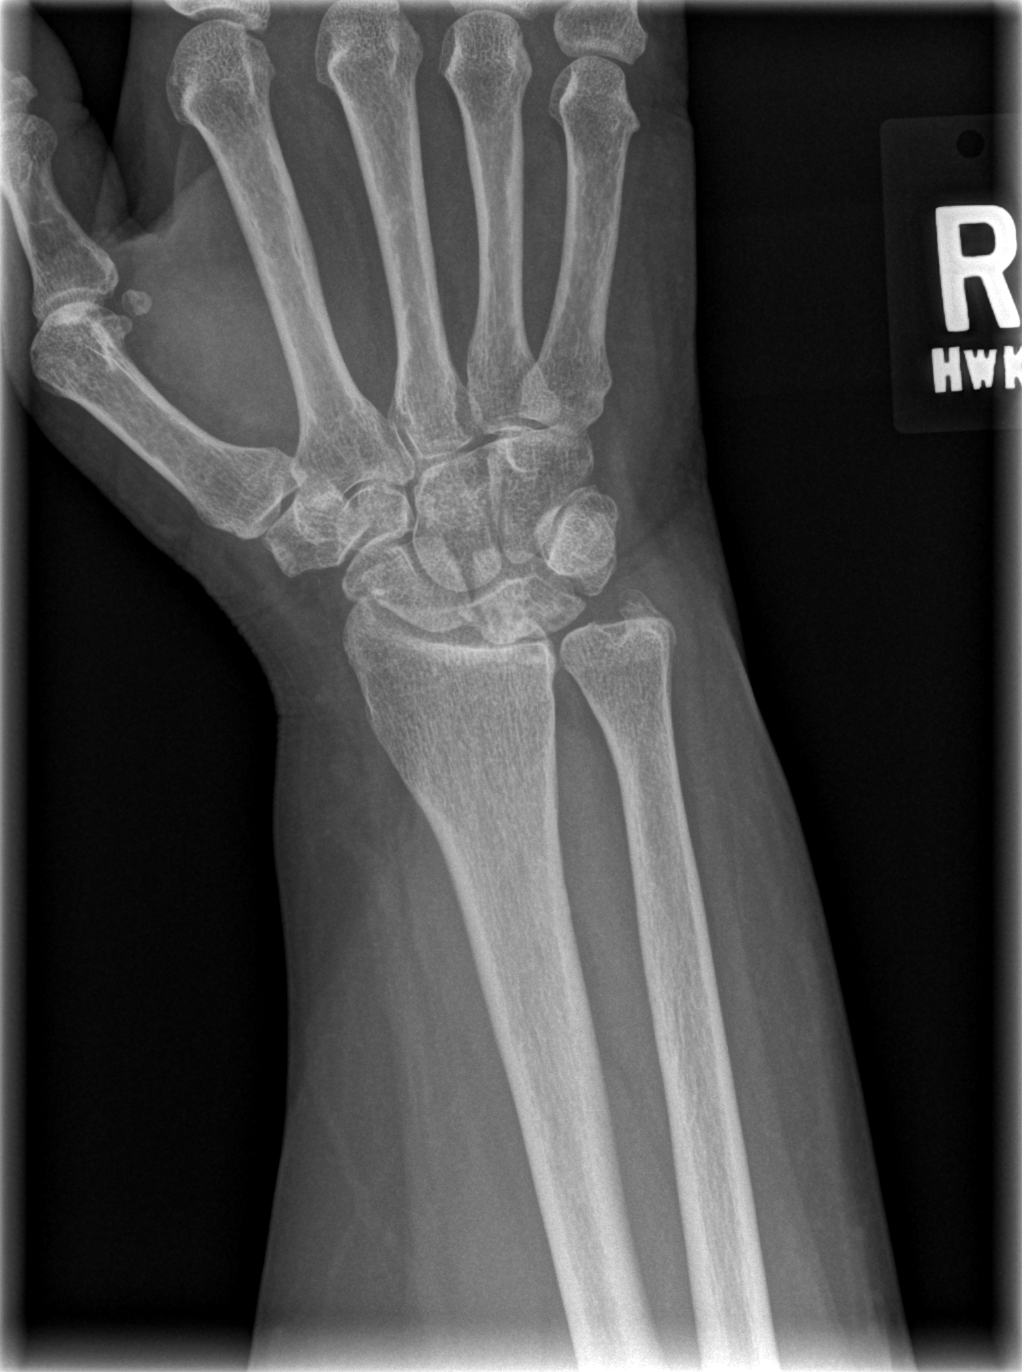

[x wrist lat right]
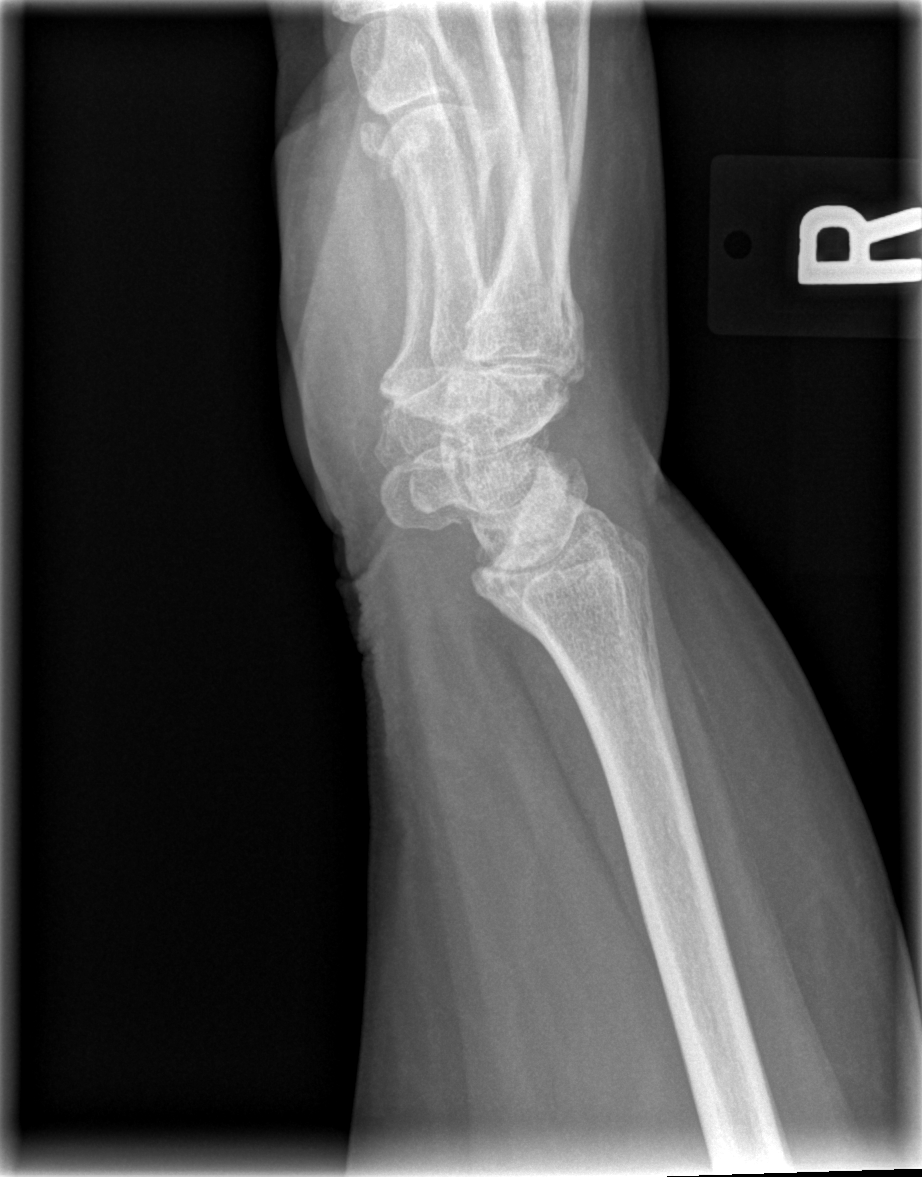

[2 of 2 positions shown; findings below may reference images not displayed]

FINDINGS: Two views of the left wrist submitted. No acute fracture or
subluxation. Significant narrowing of radiocarpal joint space.
Cystic and sclerotic changes noted lunate bone.
IMPRESSION: No acute fracture or subluxation. Osteoarthritic changes as
described above.

## 2013-10-14 IMAGING — CR DG WRIST 2V*L*
2 series · 2 of 2 positions shown · non-contrast
Comparison: None.

CLINICAL DATA: Rheumatoid arthritis

EXAM:
LEFT WRIST - 2 VIEW

[x wrist pa left]
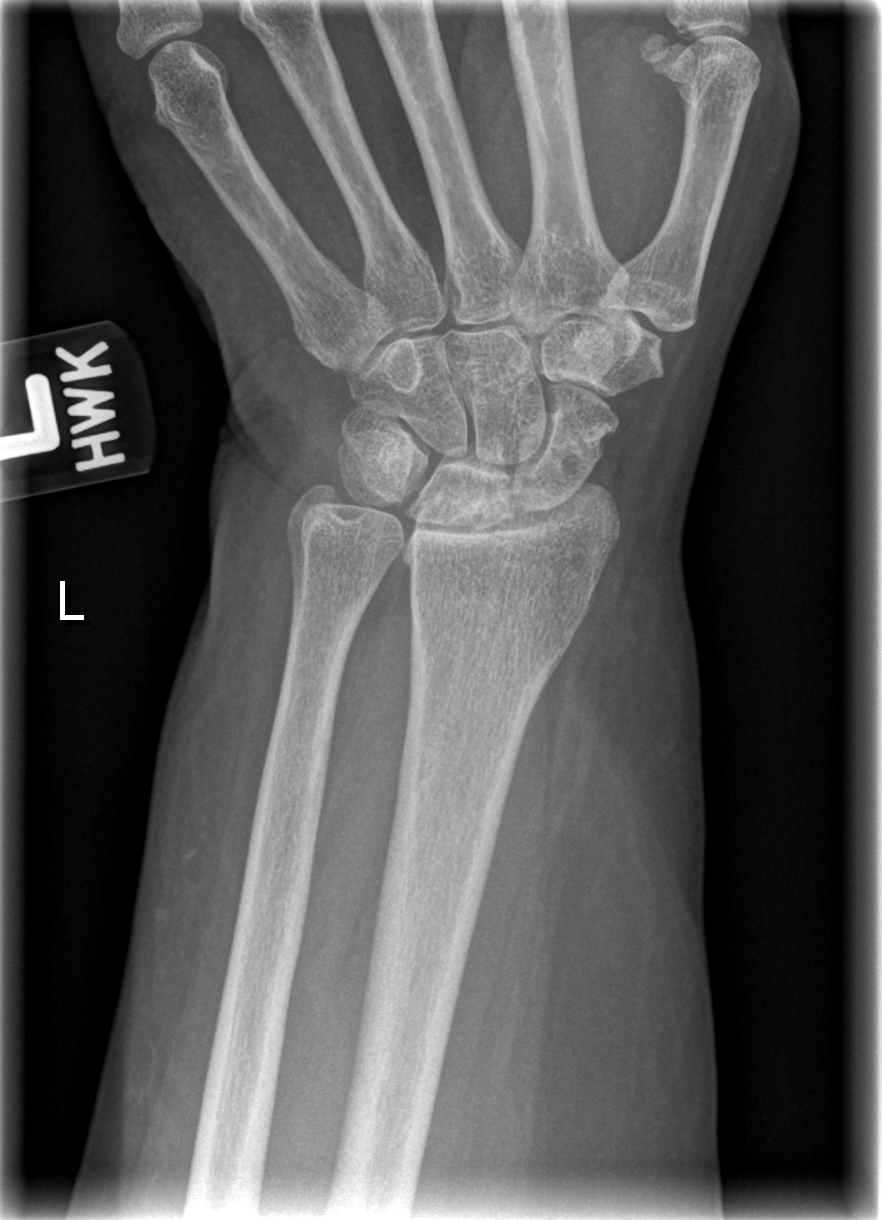

[x wrist lat left]
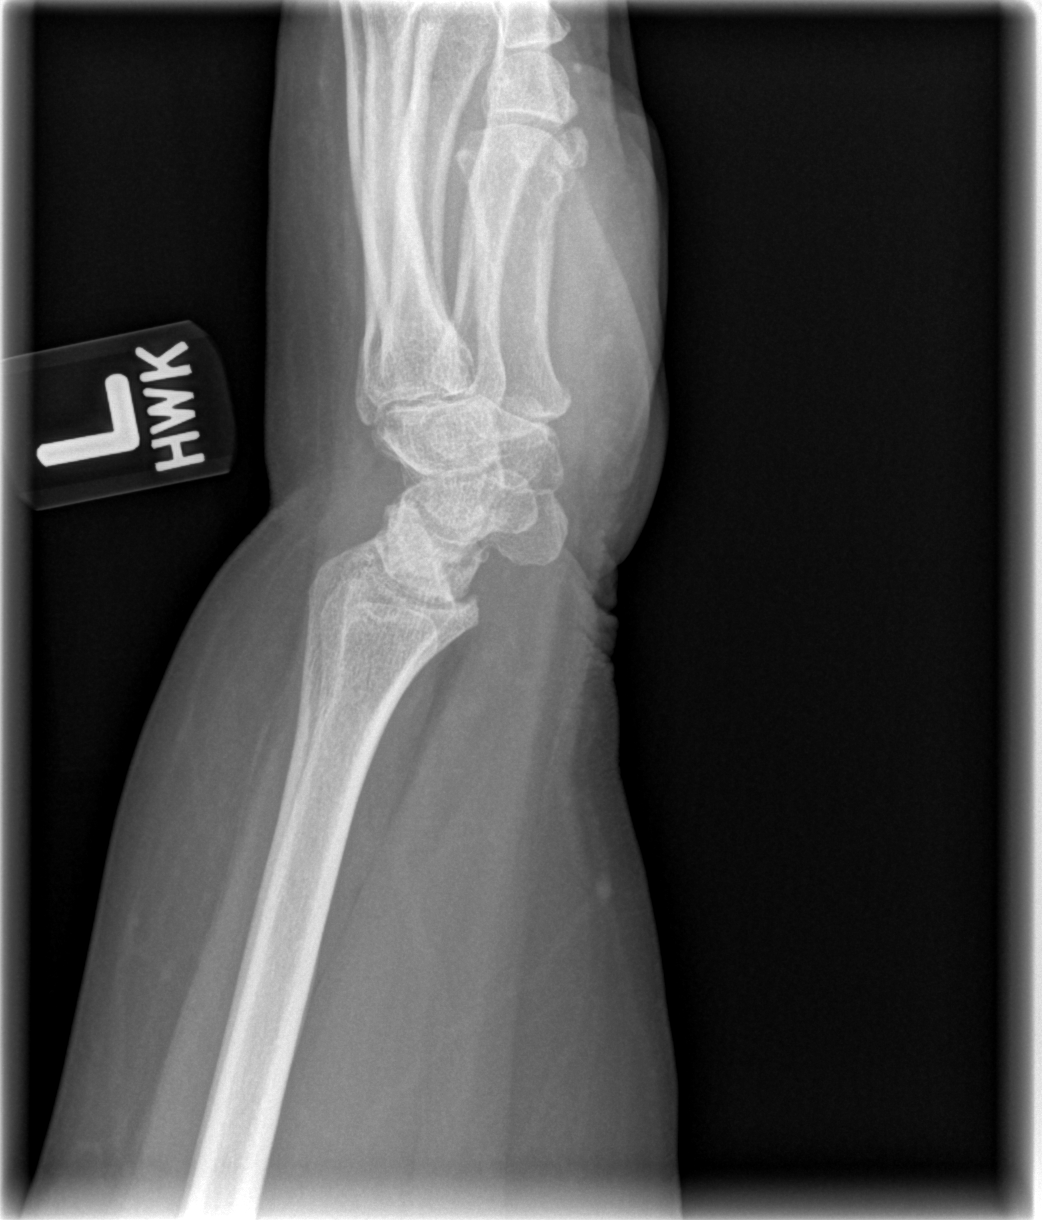

[2 of 2 positions shown; findings below may reference images not displayed]

FINDINGS: Two views of the left wrist submitted. No acute fracture or
subluxation. Narrowing of radiocarpal joint space. Mild cystic
degenerative changes are noted scaphoid. Small bony erosion is noted
in the scaphoid.
IMPRESSION: No acute fracture or subluxation. Osteoarthritic changes as
described above.

## 2013-12-15 ENCOUNTER — Other Ambulatory Visit: Payer: Self-pay | Admitting: Orthopedic Surgery

## 2013-12-15 ENCOUNTER — Ambulatory Visit
Admission: RE | Admit: 2013-12-15 | Discharge: 2013-12-15 | Disposition: A | Discharge status: Home / Self Care | Payer: Medicare Other | Source: Ambulatory Visit | Attending: Orthopedic Surgery | Admitting: Orthopedic Surgery

## 2013-12-15 ENCOUNTER — Encounter (INDEPENDENT_AMBULATORY_CARE_PROVIDER_SITE_OTHER): Payer: Self-pay

## 2013-12-15 DIAGNOSIS — M7541 Impingement syndrome of right shoulder: Secondary | ICD-10-CM

## 2013-12-15 IMAGING — CR DG SHOULDER 2+V*R*
3 series · 3 of 3 positions shown · non-contrast
Comparison: [DATE]

CLINICAL DATA: Impingement syndrome of right shoulder. Right
shoulder pain that sometimes radiates down to the right elbow.

EXAM:
RIGHT SHOULDER - 2+ VIEW

[w shoulder ap external righ]
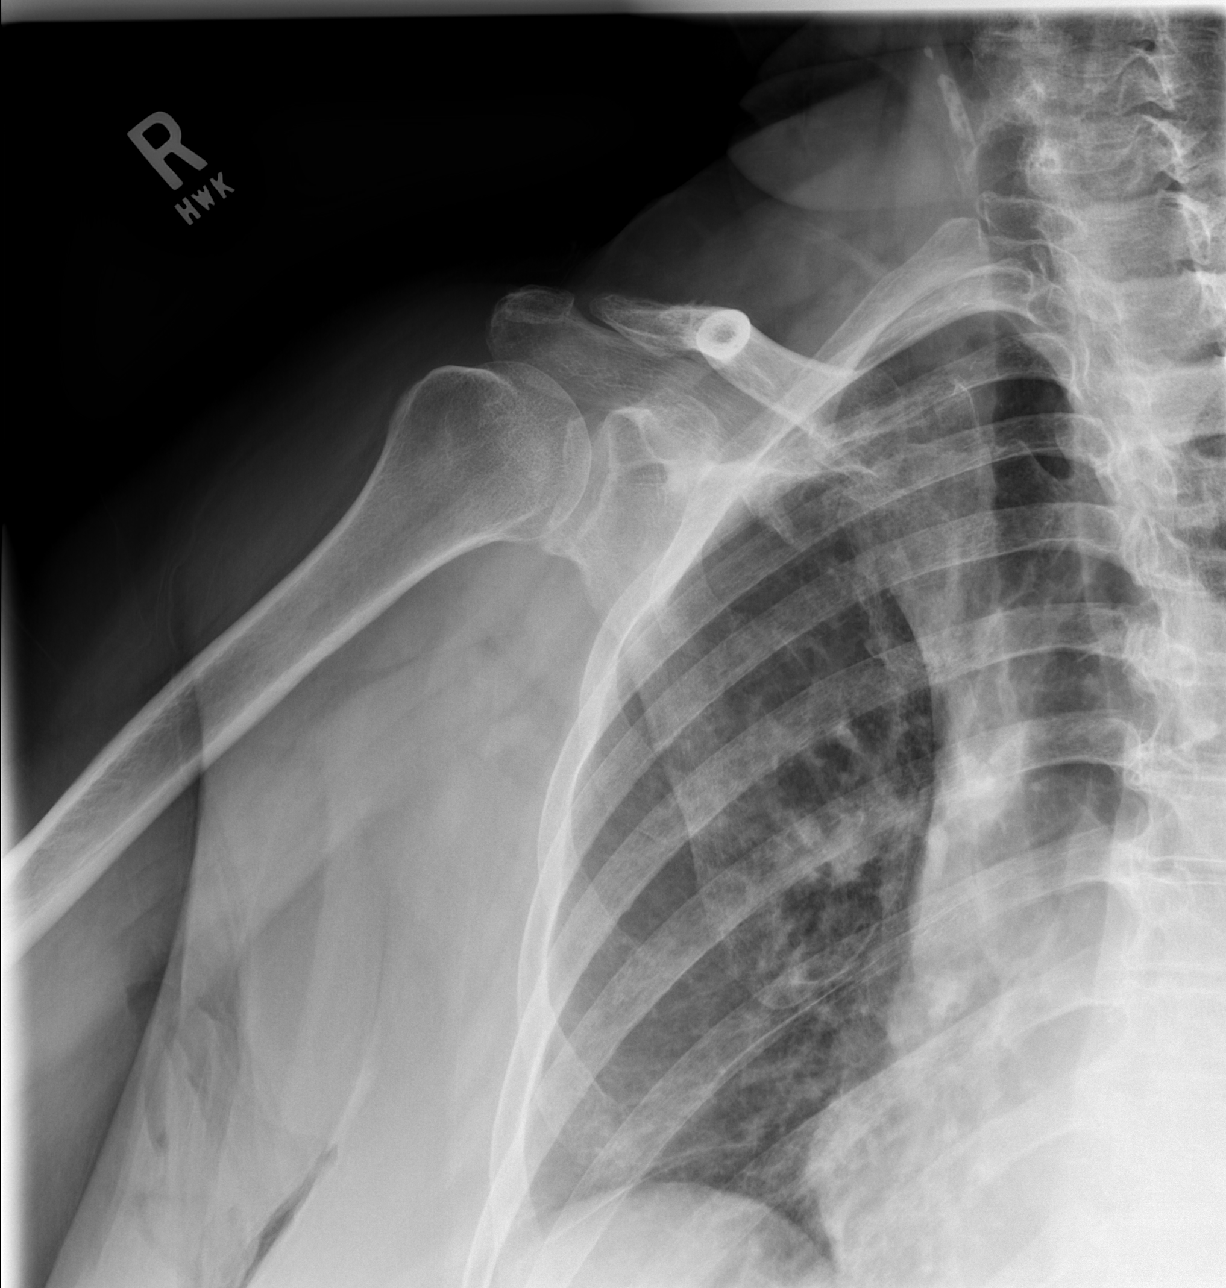

[w shoulder y view right *]
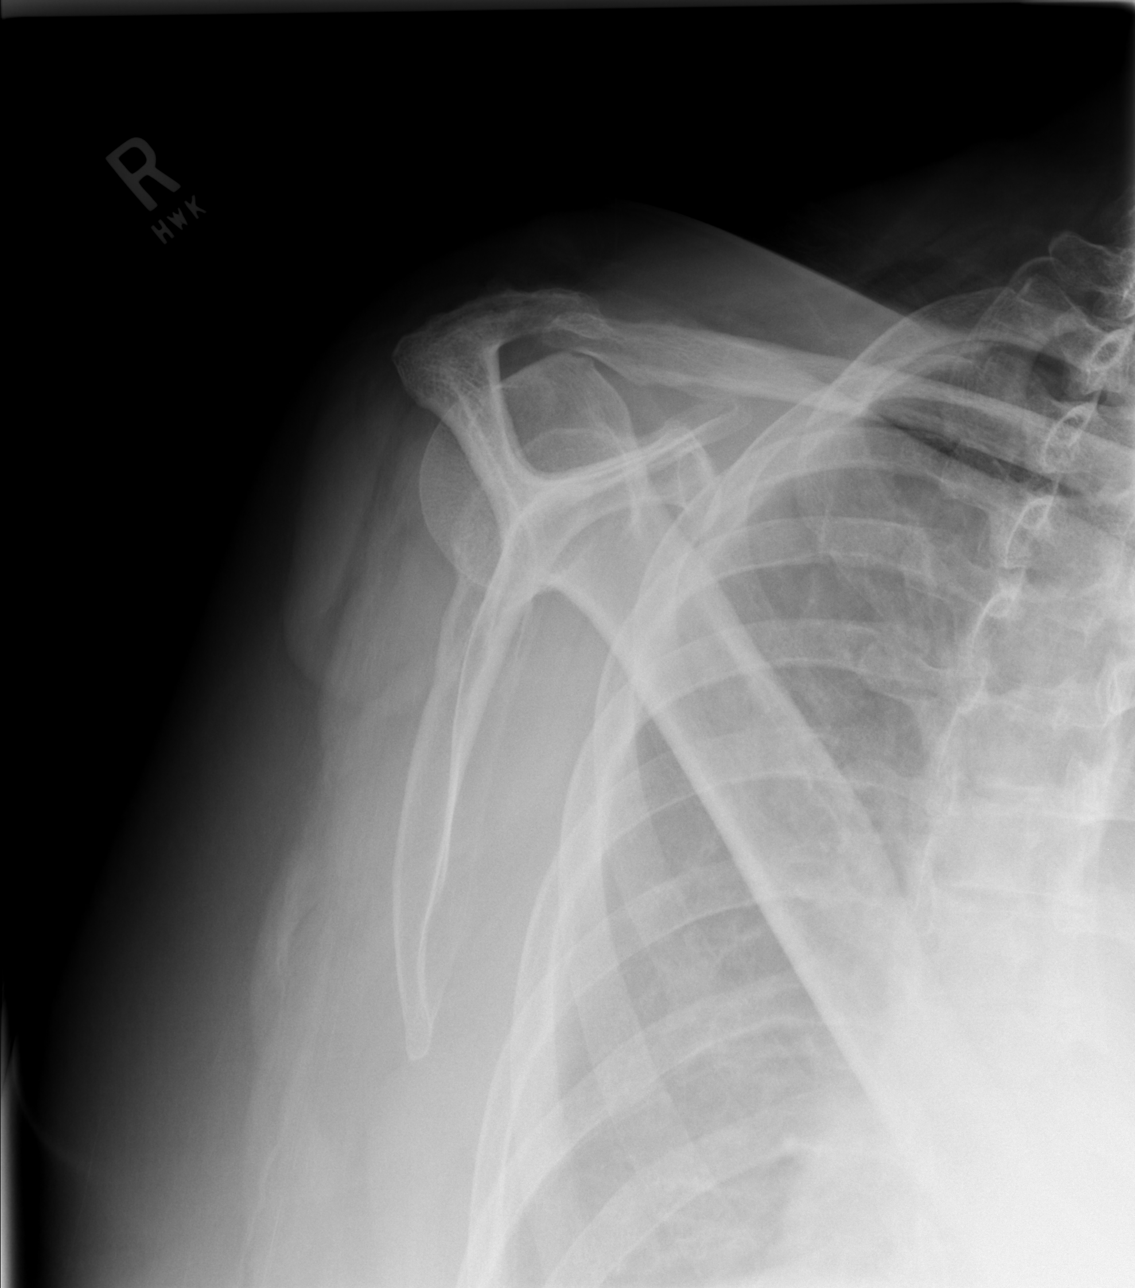

[w shoulder axillary right *]
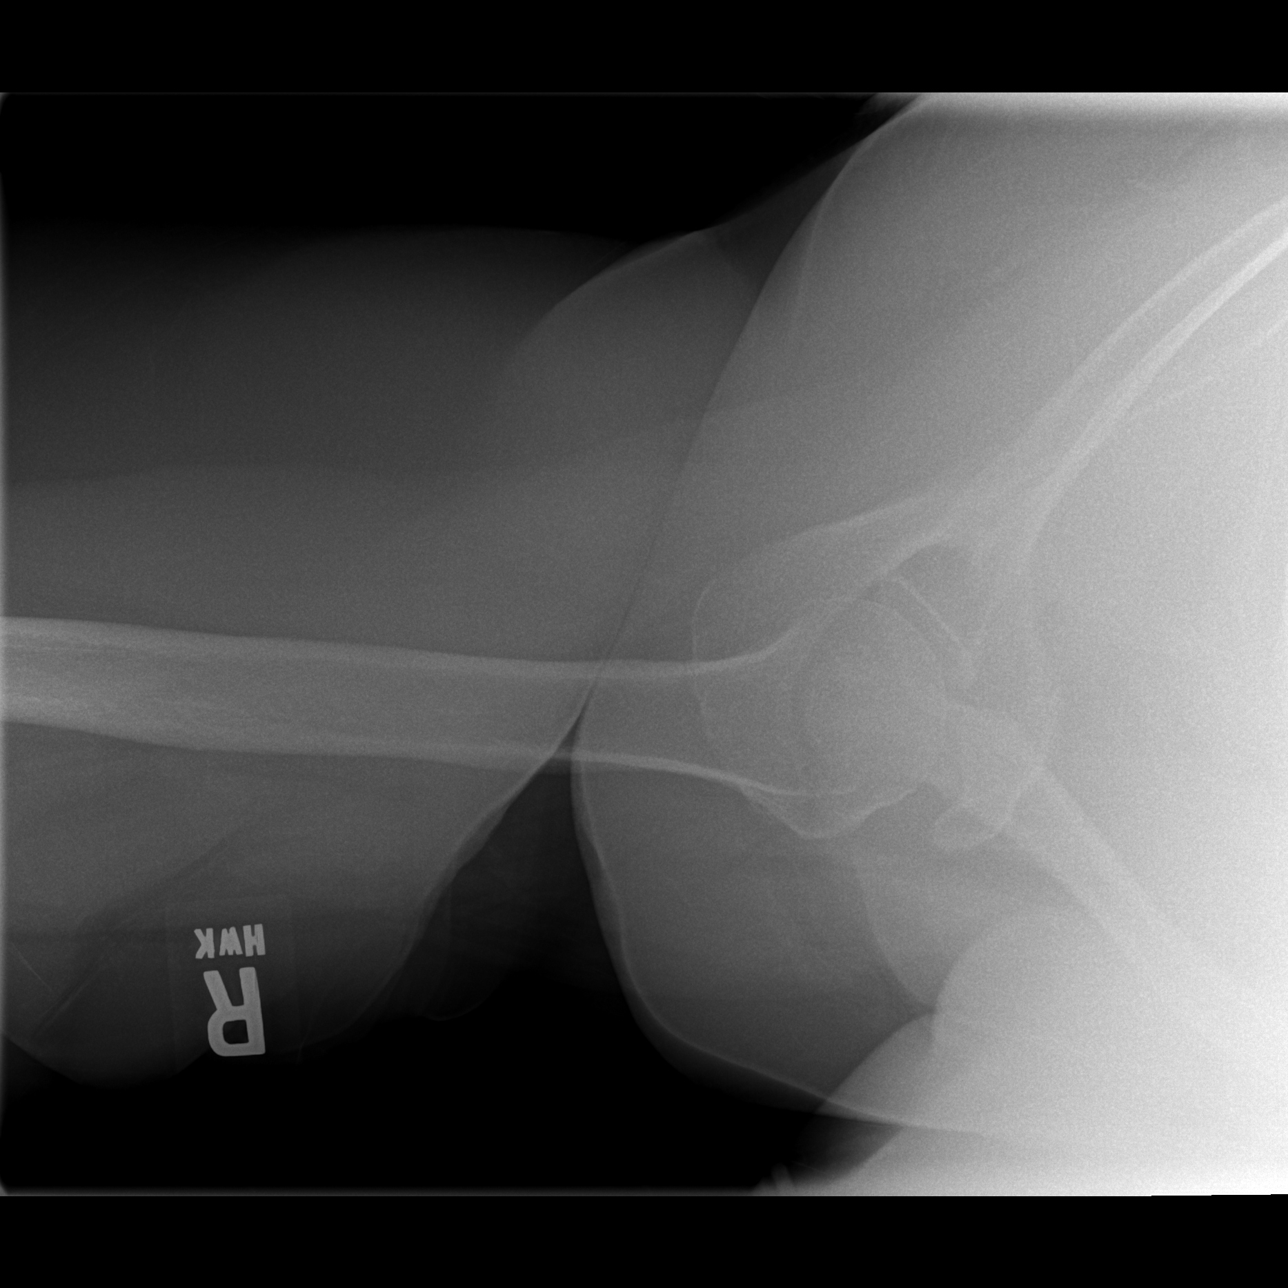

[3 of 3 positions shown; findings below may reference images not displayed]

FINDINGS: Widening of the acromioclavicular joint described on the prior study
is less conspicuous on the current examination. There is no evidence
of acute fracture or dislocation. No significant degenerative change
is identified. No lytic or blastic osseous lesion.
IMPRESSION: No acute osseous abnormality or significant degenerative change
identified.

## 2014-03-15 ENCOUNTER — Other Ambulatory Visit: Payer: Self-pay | Admitting: Cardiovascular Disease

## 2014-03-15 DIAGNOSIS — Z1231 Encounter for screening mammogram for malignant neoplasm of breast: Secondary | ICD-10-CM

## 2014-03-31 ENCOUNTER — Ambulatory Visit
Admission: RE | Admit: 2014-03-31 | Discharge: 2014-03-31 | Disposition: A | Discharge status: Home / Self Care | Payer: Medicare Other | Source: Ambulatory Visit | Attending: Cardiovascular Disease | Admitting: Cardiovascular Disease

## 2014-03-31 DIAGNOSIS — Z1231 Encounter for screening mammogram for malignant neoplasm of breast: Secondary | ICD-10-CM

## 2014-06-16 ENCOUNTER — Ambulatory Visit
Admission: RE | Admit: 2014-06-16 | Discharge: 2014-06-16 | Disposition: A | Discharge status: Home / Self Care | Payer: Medicare Other | Source: Ambulatory Visit | Attending: Orthopedic Surgery | Admitting: Orthopedic Surgery

## 2014-06-16 ENCOUNTER — Other Ambulatory Visit: Payer: Self-pay | Admitting: Orthopedic Surgery

## 2014-06-16 DIAGNOSIS — M659 Unspecified synovitis and tenosynovitis, unspecified site: Secondary | ICD-10-CM

## 2014-06-16 IMAGING — CR DG KNEE COMPLETE 4+V*R*
3 series · 3 of 3 positions shown · non-contrast
Comparison: Plain film [DATE]

CLINICAL DATA: 69-year-old female with a history of right knee
pain.

EXAM:
RIGHT KNEE - COMPLETE 4+ VIEW

[w knee obl. right * (1 of 2)]
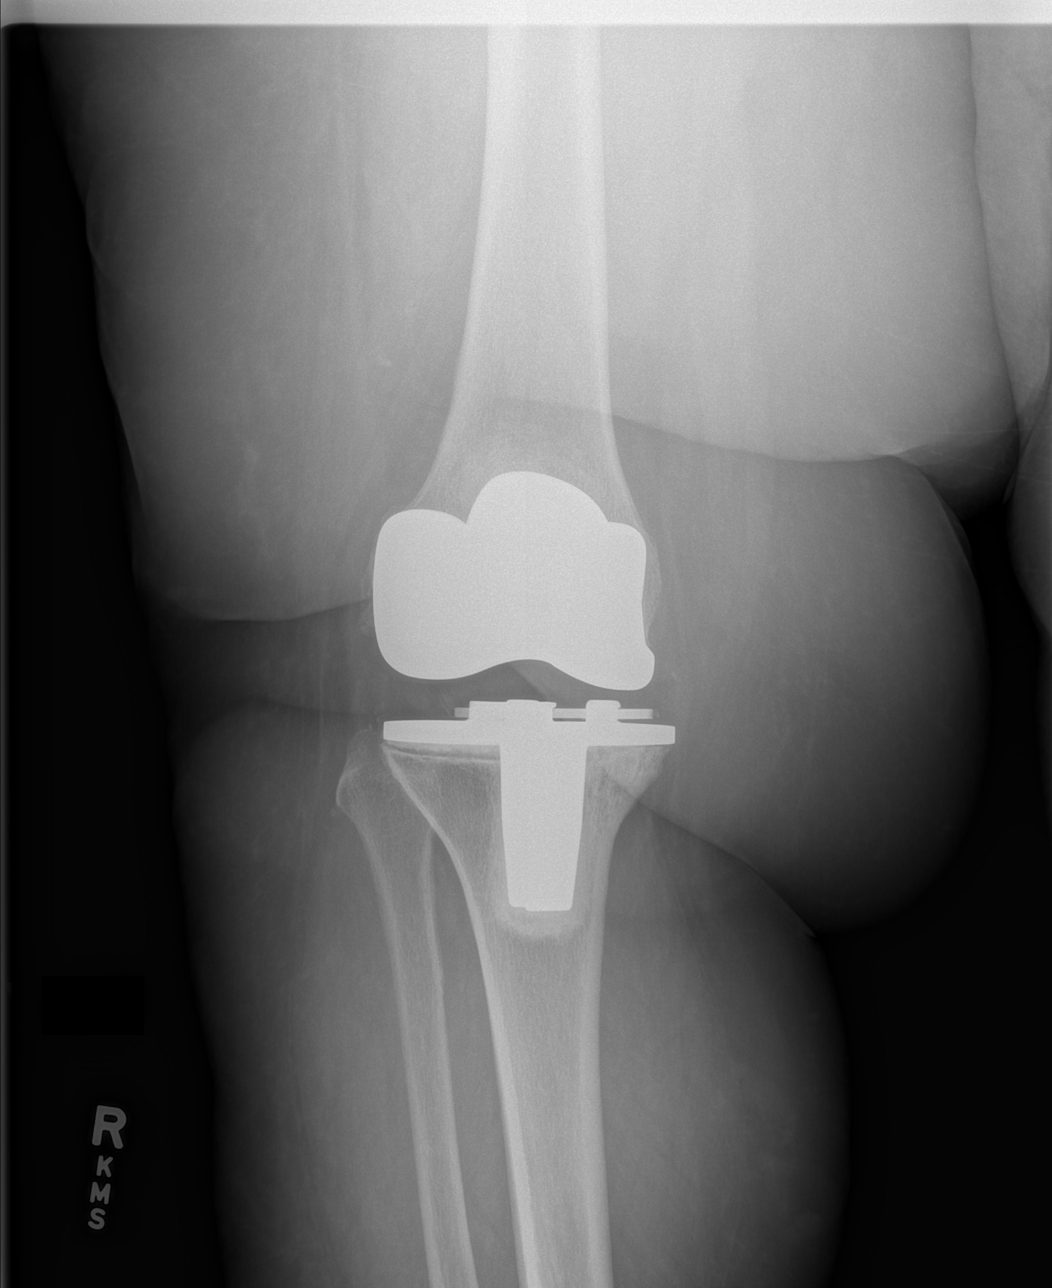

[w knee obl. right * (2 of 2)]
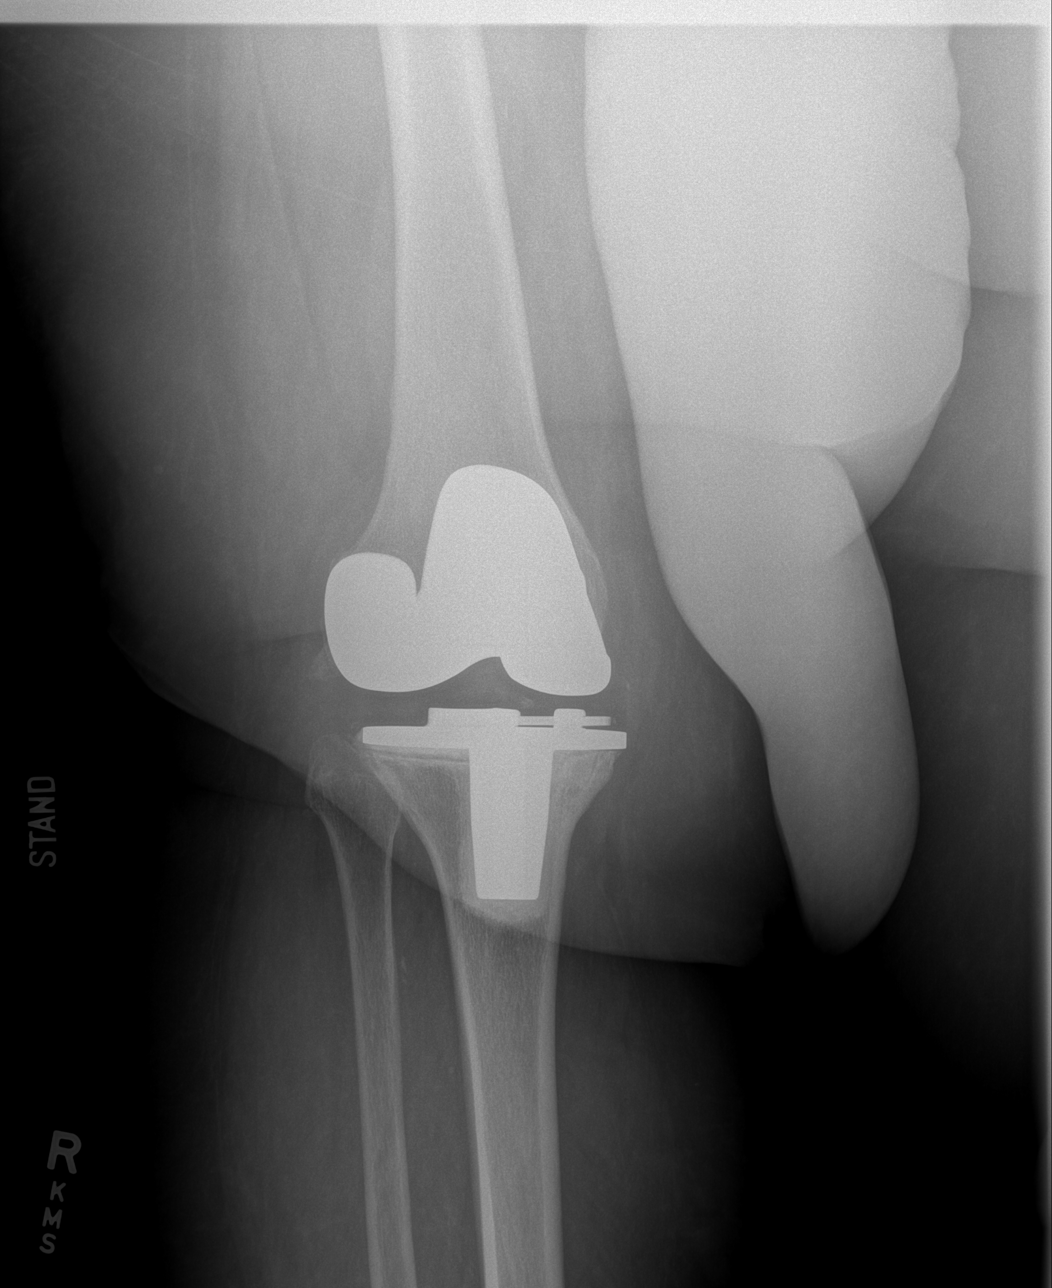

[w knee lat. right *]
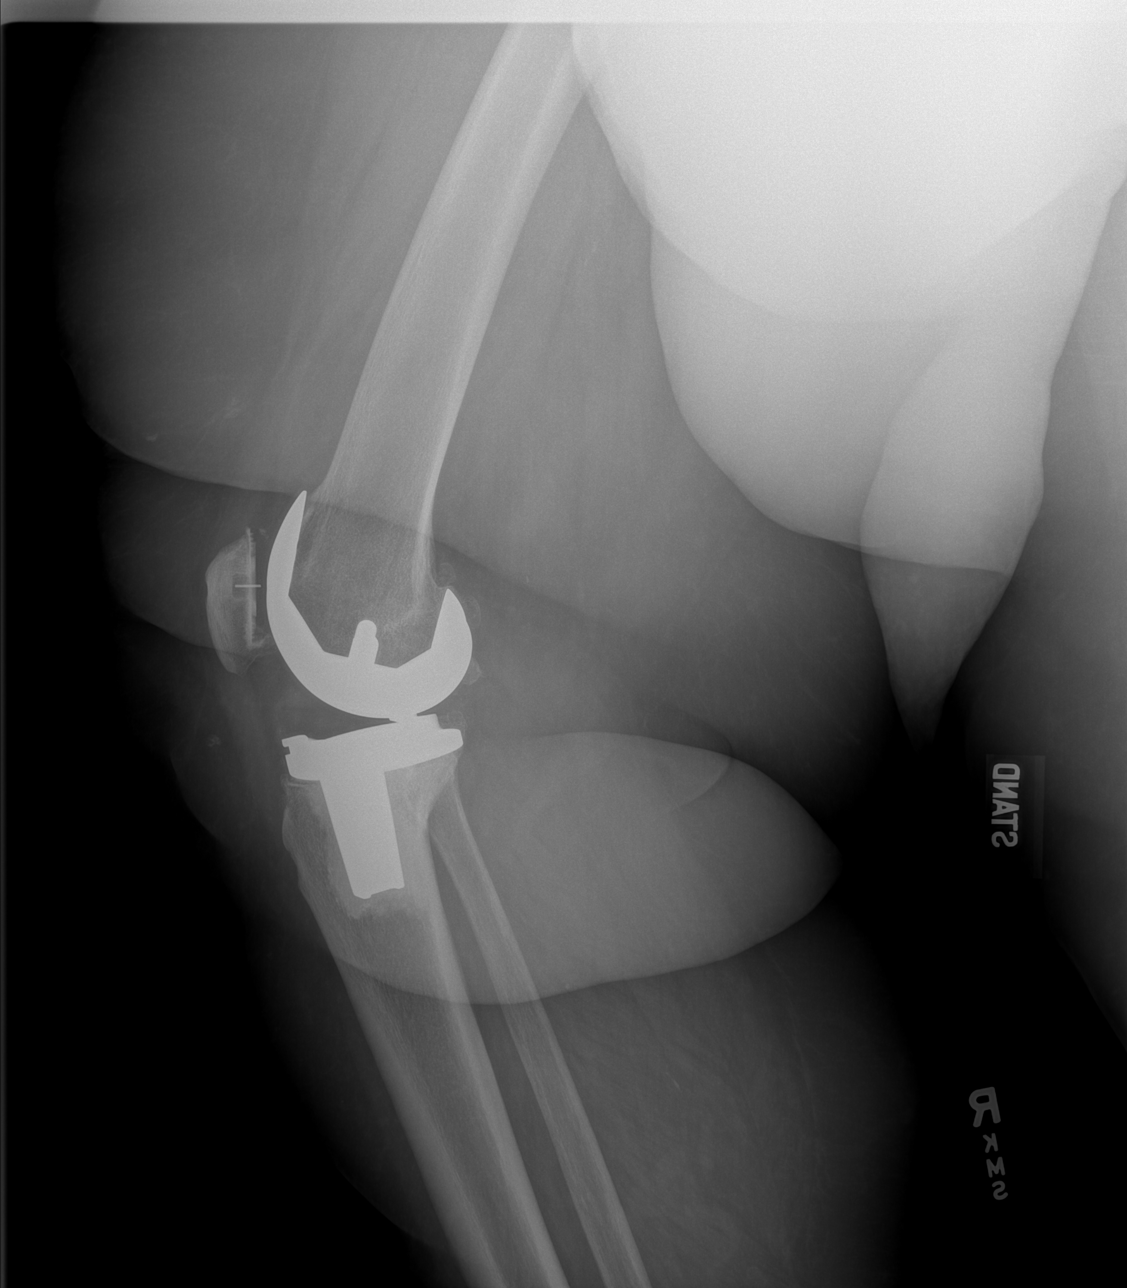

[3 of 3 positions shown; findings below may reference images not displayed]

FINDINGS: Surgical changes of right total knee arthroplasty. No perihardware
fracture. No joint effusion. No radiopaque foreign body. Alignment
maintained.
IMPRESSION: Since the prior plain film there has been interval right total knee
arthroplasty.

No acute bony abnormality or perihardware fracture identified.

## 2014-06-16 IMAGING — CR DG KNEE COMPLETE 4+V*R*
1 series · 1 of 1 positions shown · non-contrast
Comparison: Plain film [DATE]

CLINICAL DATA: 69-year-old female with a history of right knee
pain.

EXAM:
RIGHT KNEE - COMPLETE 4+ VIEW

[view not recorded]
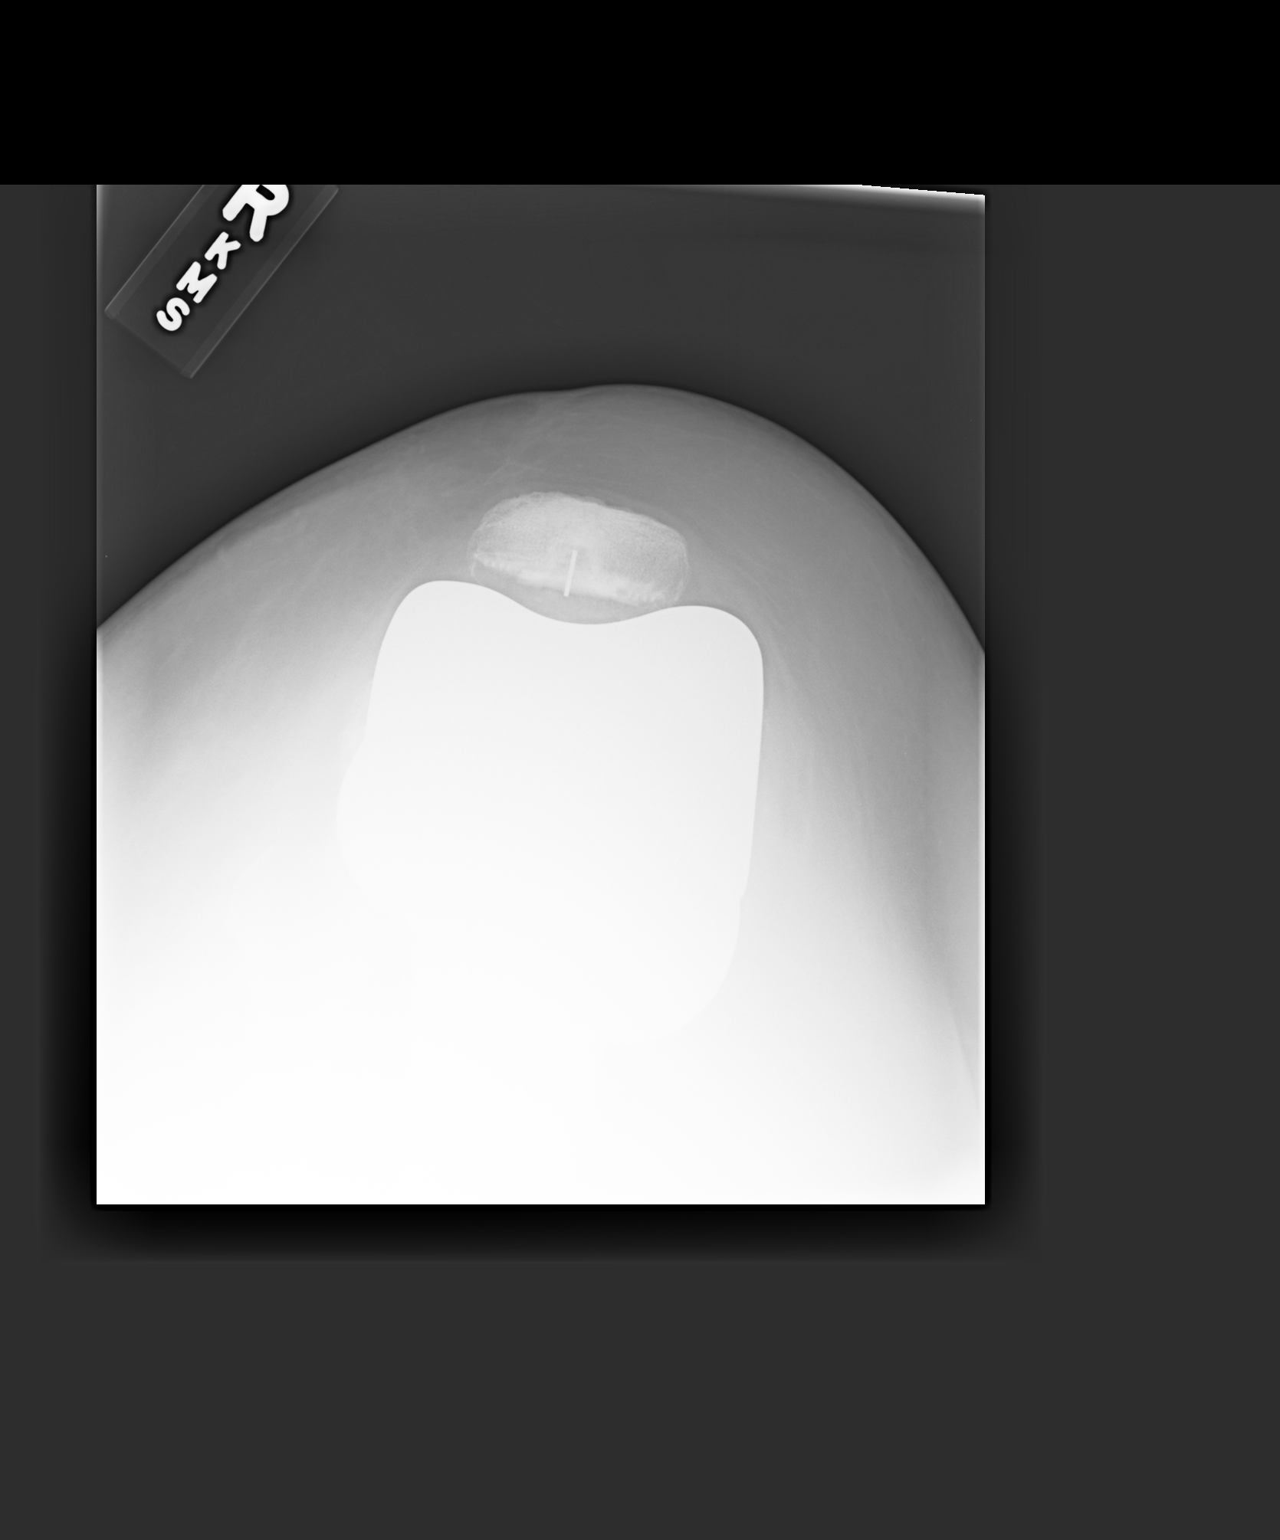

[1 of 1 positions shown; findings below may reference images not displayed]

FINDINGS: Surgical changes of right total knee arthroplasty. No perihardware
fracture. No joint effusion. No radiopaque foreign body. Alignment
maintained.
IMPRESSION: Since the prior plain film there has been interval right total knee
arthroplasty.

No acute bony abnormality or perihardware fracture identified.

## 2015-05-04 ENCOUNTER — Encounter (HOSPITAL_COMMUNITY): Payer: Self-pay

## 2015-05-04 ENCOUNTER — Encounter (HOSPITAL_COMMUNITY)
Admission: RE | Admit: 2015-05-04 | Discharge: 2015-05-04 | Disposition: A | Discharge status: Home / Self Care | Payer: Medicare Other | Source: Ambulatory Visit | Attending: Orthopaedic Surgery | Admitting: Orthopaedic Surgery

## 2015-05-04 DIAGNOSIS — Z0183 Encounter for blood typing: Secondary | ICD-10-CM | POA: Insufficient documentation

## 2015-05-04 DIAGNOSIS — E785 Hyperlipidemia, unspecified: Secondary | ICD-10-CM | POA: Insufficient documentation

## 2015-05-04 DIAGNOSIS — R7303 Prediabetes: Secondary | ICD-10-CM | POA: Insufficient documentation

## 2015-05-04 DIAGNOSIS — I10 Essential (primary) hypertension: Secondary | ICD-10-CM | POA: Diagnosis not present

## 2015-05-04 DIAGNOSIS — E039 Hypothyroidism, unspecified: Secondary | ICD-10-CM | POA: Insufficient documentation

## 2015-05-04 DIAGNOSIS — I2510 Atherosclerotic heart disease of native coronary artery without angina pectoris: Secondary | ICD-10-CM | POA: Insufficient documentation

## 2015-05-04 DIAGNOSIS — M069 Rheumatoid arthritis, unspecified: Secondary | ICD-10-CM | POA: Diagnosis not present

## 2015-05-04 DIAGNOSIS — Z01818 Encounter for other preprocedural examination: Secondary | ICD-10-CM | POA: Diagnosis not present

## 2015-05-04 DIAGNOSIS — Z01812 Encounter for preprocedural laboratory examination: Secondary | ICD-10-CM | POA: Diagnosis not present

## 2015-05-04 DIAGNOSIS — Z79899 Other long term (current) drug therapy: Secondary | ICD-10-CM | POA: Insufficient documentation

## 2015-05-04 HISTORY — DX: Unspecified chronic bronchitis: J42

## 2015-05-04 HISTORY — DX: Personal history of colon polyps, unspecified: Z86.0100

## 2015-05-04 HISTORY — DX: Paresthesia of skin: R20.0

## 2015-05-04 HISTORY — DX: Anemia, unspecified: D64.9

## 2015-05-04 HISTORY — DX: Personal history of other medical treatment: Z92.89

## 2015-05-04 HISTORY — DX: Dry eye syndrome of bilateral lacrimal glands: H04.123

## 2015-05-04 HISTORY — DX: Dizziness and giddiness: R42

## 2015-05-04 HISTORY — DX: Paresthesia of skin: R20.2

## 2015-05-04 HISTORY — DX: Edema, unspecified: R60.9

## 2015-05-04 HISTORY — DX: Nocturia: R35.1

## 2015-05-04 HISTORY — DX: Localized edema: R60.0

## 2015-05-04 HISTORY — DX: Personal history of colonic polyps: Z86.010

## 2015-05-04 LAB — URINALYSIS, ROUTINE W REFLEX MICROSCOPIC
Bilirubin Urine: NEGATIVE
GLUCOSE, UA: NEGATIVE mg/dL
HGB URINE DIPSTICK: NEGATIVE
Ketones, ur: NEGATIVE mg/dL
Nitrite: NEGATIVE
PH: 5.5 (ref 5.0–8.0)
Protein, ur: NEGATIVE mg/dL
SPECIFIC GRAVITY, URINE: 1.021 (ref 1.005–1.030)

## 2015-05-04 LAB — COMPREHENSIVE METABOLIC PANEL
ALK PHOS: 52 U/L (ref 38–126)
ALT: 9 U/L — ABNORMAL LOW (ref 14–54)
ANION GAP: 10 (ref 5–15)
AST: 16 U/L (ref 15–41)
Albumin: 3.6 g/dL (ref 3.5–5.0)
BILIRUBIN TOTAL: 0.6 mg/dL (ref 0.3–1.2)
BUN: 21 mg/dL — ABNORMAL HIGH (ref 6–20)
CALCIUM: 9.9 mg/dL (ref 8.9–10.3)
CO2: 25 mmol/L (ref 22–32)
Chloride: 111 mmol/L (ref 101–111)
Creatinine, Ser: 1.07 mg/dL — ABNORMAL HIGH (ref 0.44–1.00)
GFR calc non Af Amer: 52 mL/min — ABNORMAL LOW (ref >60)
Glucose, Bld: 92 mg/dL (ref 65–99)
POTASSIUM: 3.9 mmol/L (ref 3.5–5.1)
SODIUM: 146 mmol/L — AB (ref 135–145)
TOTAL PROTEIN: 6.8 g/dL (ref 6.5–8.1)

## 2015-05-04 LAB — CBC WITH DIFFERENTIAL/PLATELET
BASOS PCT: 1 %
Basophils Absolute: 0.1 10*3/uL (ref 0.0–0.1)
EOS ABS: 0.1 10*3/uL (ref 0.0–0.7)
Eosinophils Relative: 2 %
HCT: 33.3 % — ABNORMAL LOW (ref 36.0–46.0)
HEMOGLOBIN: 9.9 g/dL — AB (ref 12.0–15.0)
LYMPHS ABS: 2.2 10*3/uL (ref 0.7–4.0)
Lymphocytes Relative: 40 %
MCH: 28 pg (ref 26.0–34.0)
MCHC: 29.7 g/dL — AB (ref 30.0–36.0)
MCV: 94.3 fL (ref 78.0–100.0)
MONO ABS: 0.7 10*3/uL (ref 0.1–1.0)
MONOS PCT: 12 %
NEUTROS PCT: 45 %
Neutro Abs: 2.5 10*3/uL (ref 1.7–7.7)
Platelets: 297 10*3/uL (ref 150–400)
RBC: 3.53 MIL/uL — ABNORMAL LOW (ref 3.87–5.11)
RDW: 15.4 % (ref 11.5–15.5)
WBC: 5.6 10*3/uL (ref 4.0–10.5)

## 2015-05-04 LAB — TYPE AND SCREEN
ABO/RH(D): B POS
ANTIBODY SCREEN: NEGATIVE

## 2015-05-04 LAB — URINE MICROSCOPIC-ADD ON: RBC / HPF: NONE SEEN RBC/hpf (ref 0–5)

## 2015-05-04 LAB — PROTIME-INR
INR: 1.12 (ref 0.00–1.49)
Prothrombin Time: 14.6 seconds (ref 11.6–15.2)

## 2015-05-04 LAB — SURGICAL PCR SCREEN
MRSA, PCR: NEGATIVE
Staphylococcus aureus: NEGATIVE

## 2015-05-04 LAB — APTT: aPTT: 35 seconds (ref 24–37)

## 2015-05-04 MED ORDER — CHLORHEXIDINE GLUCONATE 4 % EX LIQD: 60.0000 mL | Freq: Once | CUTANEOUS | Status: DC

## 2015-05-04 NOTE — Pre-Procedure Instructions (Signed)
[name]  05/04/2015      [facility], [place] [address] Phone: [phone] Fax: [phone]    Your procedure is scheduled on Mon, April 17 @ 12:30 PM  Report to [facility] Admitting at 10:30 AM  Call this number if you have problems the morning of surgery:  [phone]   Remember:  Do not eat food or drink liquids after midnight.  Take these medicines the morning of surgery with A SIP OF WATER Eye Drops,Pain Pill(if needed),Combivent<Bring Your Inhaler With You>,Synthroid(Levothyroxine),and Metoprolol(Toprol)               Stop taking your Lodine and Methotrexate. No Goody's,BC's,Aleve,Advil,Motrin,Fish Oil,Aspirin,Ibuprofen,Fish Oil,or any Herbal Medications.    Do not wear jewelry, make-up or nail polish.  Do not wear lotions, powders, or perfumes.  You may wear deodorant.  Do not shave 48 hours prior to surgery.    Do not bring valuables to the hospital.  [facility] is not responsible for any belongings or valuables.  Contacts, dentures or bridgework may not be worn into surgery.  Leave your suitcase in the car.  After surgery it may be brought to your room.  For patients admitted to the hospital, discharge time will be determined by your treatment team.  Patients discharged the day of surgery will not be allowed to drive home.    Special instructions:  [facility] - Preparing for Surgery  Before surgery, you can play an important role.  Because skin is not sterile, your skin needs to be as free of germs as possible.  You can reduce the number of germs on you skin by washing with CHG (chlorahexidine gluconate) soap before surgery.  CHG is an antiseptic cleaner which kills germs and bonds with the skin to continue killing germs even after washing.  Please DO NOT use if you have an allergy to CHG or antibacterial soaps.  If your skin becomes reddened/irritated  stop using the CHG and inform your nurse when you arrive at Short Stay.  Do not shave (including legs and underarms) for at least 48 hours prior to the first CHG shower.  You may shave your face.  Please follow these instructions carefully:   1.  Shower with CHG Soap the night before surgery and the                                morning of Surgery.  2.  If you choose to wash your hair, wash your hair first as usual with your       normal shampoo.  3.  After you shampoo, rinse your hair and body thoroughly to remove the                      Shampoo.  4.  Use CHG as you would any other liquid soap.  You can apply chg directly       to the skin and wash gently with scrungie or a clean washcloth.  5.  Apply the CHG Soap to your body ONLY FROM THE NECK DOWN.        Do not use on open wounds or open sores.  Avoid contact with your eyes,       ears, mouth and genitals (private parts).  Wash genitals (private parts)  with your normal soap.  6.  Wash thoroughly, paying special attention to the area where your surgery        will be performed.  7.  Thoroughly rinse your body with warm water from the neck down.  8.  DO NOT shower/wash with your normal soap after using and rinsing off       the CHG Soap.  9.  Pat yourself dry with a clean towel.            10.  Wear clean pajamas.            11.  Place clean sheets on your bed the night of your first shower and do not        sleep with pets.  Day of Surgery  Do not apply any lotions/deoderants the morning of surgery.  Please wear clean clothes to the hospital/surgery center.    Please read over the following fact sheets that you were given. Pain Booklet, Coughing and Deep Breathing, Blood Transfusion Information, MRSA Information and Surgical Site Infection Prevention

## 2015-05-04 NOTE — Progress Notes (Addendum)
Cardiologist is Dr.Kadakia last visit a month ago-to request  Medical Md is Dr.McKenzie  Echo denies  Stress test reports in epic from 2002/2005/2008  Heart cath > 5 yrs ago  EKG done a month -to request from [place] denies

## 2015-05-07 ENCOUNTER — Encounter (HOSPITAL_COMMUNITY): Payer: Self-pay | Admitting: Emergency Medicine

## 2015-05-07 NOTE — Progress Notes (Signed)
Anesthesia Chart Review:  Pt is a 70 year old female scheduled for revision R total knee arthroplasty on 05/14/2015 with Dr. Lorin Mercy.   Cardiologist is Dr. Doylene Canard who has cleared pt for surgery.   PMH includes:  CAD, HTN, hyperlipidemia, hypothyroidism, chronic bronchitis, anemia, borderline DM, RA. Former smoker. BMI 52. S/p cataract extraction 12/24/11 and 01/01/11. S/p colon surgery (has colostomy) 2002.   Medications include: azilsartan, dexamethasone, iron, lasix, combivent, levothyroxine, methotrexate, metoprolol, simvastatin  Preoperative labs reviewed.   - H/H 9.9/33.3.  -Many bacteria on UA - Notified Cheryl in Dr. Lorin Mercy' office of lab results  EKG requested from Dr. Merrilee Jansky office.   By notes from Dr. Merrilee Jansky office, pt had echo 03/14/15 that showed normal LV size and systolic function, EF 18%, mild LVH and diastolic dysfunction, mild MR and mild TR.   Cardiac cath on 09/01/06 (done after abnormal stress test): - LV showed good LV systolic function, EF of 55% to 60%.  - Left main has 15% to 20% distal stenosis.  - LAD has 5% to 10% ostial stenosis and 20% to 25% proximal stenosis. Diagonal 1 and 2 were very small.  - Left circumflex has 20% to 30% proximal stenosis. OM1 was moderate size which has 20% to 25% mid-stenosis. OM-2 and OM III were very small, which were patent.  - RCA was small, nondominant which was patent.   Willeen Cass, FNP-BC [facility]/Anesthesiology Phone: [phone] 05/07/2015 4:23 PM

## 2015-05-14 ENCOUNTER — Encounter (HOSPITAL_COMMUNITY): Admission: RE | Payer: Self-pay | Source: Ambulatory Visit

## 2015-05-14 ENCOUNTER — Inpatient Hospital Stay (HOSPITAL_COMMUNITY): Admission: RE | Admit: 2015-05-14 | Payer: Medicaid Other | Source: Ambulatory Visit | Admitting: Orthopaedic Surgery

## 2015-05-14 ENCOUNTER — Other Ambulatory Visit: Payer: Self-pay

## 2015-05-14 DIAGNOSIS — Z1231 Encounter for screening mammogram for malignant neoplasm of breast: Secondary | ICD-10-CM

## 2015-05-14 SURGERY — TOTAL KNEE REVISION
Anesthesia: General | Laterality: Right

## 2015-10-22 ENCOUNTER — Encounter (HOSPITAL_COMMUNITY)
Admission: RE | Admit: 2015-10-22 | Discharge: 2015-10-22 | Disposition: A | Discharge status: Home / Self Care | Payer: Medicare Other | Source: Ambulatory Visit | Attending: Orthopaedic Surgery | Admitting: Orthopaedic Surgery

## 2015-10-22 ENCOUNTER — Ambulatory Visit (HOSPITAL_COMMUNITY)
Admission: RE | Admit: 2015-10-22 | Discharge: 2015-10-22 | Disposition: A | Discharge status: Home / Self Care | Payer: Medicare Other | Source: Ambulatory Visit | Attending: Surgery | Admitting: Surgery

## 2015-10-22 ENCOUNTER — Encounter (HOSPITAL_COMMUNITY): Payer: Self-pay

## 2015-10-22 DIAGNOSIS — I10 Essential (primary) hypertension: Secondary | ICD-10-CM

## 2015-10-22 DIAGNOSIS — Z01818 Encounter for other preprocedural examination: Secondary | ICD-10-CM

## 2015-10-22 DIAGNOSIS — I700 Atherosclerosis of aorta: Secondary | ICD-10-CM | POA: Diagnosis present

## 2015-10-22 DIAGNOSIS — R918 Other nonspecific abnormal finding of lung field: Secondary | ICD-10-CM | POA: Insufficient documentation

## 2015-10-22 DIAGNOSIS — I2510 Atherosclerotic heart disease of native coronary artery without angina pectoris: Secondary | ICD-10-CM | POA: Insufficient documentation

## 2015-10-22 DIAGNOSIS — Z87891 Personal history of nicotine dependence: Secondary | ICD-10-CM

## 2015-10-22 DIAGNOSIS — T84012A Broken internal right knee prosthesis, initial encounter: Principal | ICD-10-CM | POA: Diagnosis present

## 2015-10-22 DIAGNOSIS — D649 Anemia, unspecified: Secondary | ICD-10-CM

## 2015-10-22 DIAGNOSIS — M659 Synovitis and tenosynovitis, unspecified: Secondary | ICD-10-CM | POA: Diagnosis present

## 2015-10-22 DIAGNOSIS — T8484XA Pain due to internal orthopedic prosthetic devices, implants and grafts, initial encounter: Secondary | ICD-10-CM | POA: Diagnosis present

## 2015-10-22 DIAGNOSIS — E785 Hyperlipidemia, unspecified: Secondary | ICD-10-CM

## 2015-10-22 DIAGNOSIS — Z90710 Acquired absence of both cervix and uterus: Secondary | ICD-10-CM

## 2015-10-22 DIAGNOSIS — Z79899 Other long term (current) drug therapy: Secondary | ICD-10-CM

## 2015-10-22 DIAGNOSIS — M069 Rheumatoid arthritis, unspecified: Secondary | ICD-10-CM | POA: Insufficient documentation

## 2015-10-22 DIAGNOSIS — Z01812 Encounter for preprocedural laboratory examination: Secondary | ICD-10-CM | POA: Insufficient documentation

## 2015-10-22 DIAGNOSIS — M1711 Unilateral primary osteoarthritis, right knee: Secondary | ICD-10-CM | POA: Diagnosis present

## 2015-10-22 DIAGNOSIS — E039 Hypothyroidism, unspecified: Secondary | ICD-10-CM | POA: Diagnosis present

## 2015-10-22 DIAGNOSIS — Z6841 Body Mass Index (BMI) 40.0 and over, adult: Secondary | ICD-10-CM

## 2015-10-22 LAB — URINALYSIS, ROUTINE W REFLEX MICROSCOPIC
Glucose, UA: NEGATIVE mg/dL
HGB URINE DIPSTICK: NEGATIVE
KETONES UR: NEGATIVE mg/dL
Nitrite: NEGATIVE
Protein, ur: NEGATIVE mg/dL
SPECIFIC GRAVITY, URINE: 1.022 (ref 1.005–1.030)
pH: 5.5 (ref 5.0–8.0)

## 2015-10-22 LAB — COMPREHENSIVE METABOLIC PANEL
ALBUMIN: 3.3 g/dL — AB (ref 3.5–5.0)
ALT: 10 U/L — ABNORMAL LOW (ref 14–54)
ANION GAP: 6 (ref 5–15)
AST: 19 U/L (ref 15–41)
Alkaline Phosphatase: 62 U/L (ref 38–126)
BILIRUBIN TOTAL: 0.7 mg/dL (ref 0.3–1.2)
BUN: 19 mg/dL (ref 6–20)
CALCIUM: 10.1 mg/dL (ref 8.9–10.3)
CO2: 25 mmol/L (ref 22–32)
Chloride: 112 mmol/L — ABNORMAL HIGH (ref 101–111)
Creatinine, Ser: 1.14 mg/dL — ABNORMAL HIGH (ref 0.44–1.00)
GFR calc non Af Amer: 48 mL/min — ABNORMAL LOW (ref >60)
GFR, EST AFRICAN AMERICAN: 56 mL/min — AB (ref >60)
Glucose, Bld: 91 mg/dL (ref 65–99)
Potassium: 4.5 mmol/L (ref 3.5–5.1)
SODIUM: 143 mmol/L (ref 135–145)
TOTAL PROTEIN: 7.8 g/dL (ref 6.5–8.1)

## 2015-10-22 LAB — APTT: APTT: 38 s — AB (ref 24–36)

## 2015-10-22 LAB — CBC WITH DIFFERENTIAL/PLATELET
BASOS ABS: 0 10*3/uL (ref 0.0–0.1)
BASOS PCT: 1 %
EOS ABS: 0.2 10*3/uL (ref 0.0–0.7)
Eosinophils Relative: 4 %
HEMATOCRIT: 35.8 % — AB (ref 36.0–46.0)
Hemoglobin: 10.4 g/dL — ABNORMAL LOW (ref 12.0–15.0)
Lymphocytes Relative: 40 %
Lymphs Abs: 2.4 10*3/uL (ref 0.7–4.0)
MCH: 26.6 pg (ref 26.0–34.0)
MCHC: 29.1 g/dL — AB (ref 30.0–36.0)
MCV: 91.6 fL (ref 78.0–100.0)
MONO ABS: 0.5 10*3/uL (ref 0.1–1.0)
MONOS PCT: 9 %
NEUTROS ABS: 2.9 10*3/uL (ref 1.7–7.7)
NEUTROS PCT: 46 %
Platelets: 304 10*3/uL (ref 150–400)
RBC: 3.91 MIL/uL (ref 3.87–5.11)
RDW: 15.4 % (ref 11.5–15.5)
WBC: 6.1 10*3/uL (ref 4.0–10.5)

## 2015-10-22 LAB — SURGICAL PCR SCREEN
MRSA, PCR: NEGATIVE
Staphylococcus aureus: NEGATIVE

## 2015-10-22 LAB — URINE MICROSCOPIC-ADD ON

## 2015-10-22 LAB — PROTIME-INR
INR: 1.11
PROTHROMBIN TIME: 14.3 s (ref 11.4–15.2)

## 2015-10-22 IMAGING — CR DG CHEST 2V
2 series · 2 of 2 positions shown · non-contrast
Comparison: PA and lateral chest x-ray [DATE]

CLINICAL DATA: Preoperative examination prior to knee replacement
surgery. No current chest complaints. History of rheumatoid
arthritis, hypertension, coronary artery disease, former
smoker-discontinued in [DI].

EXAM:
CHEST  2 VIEW

[w chest pa]
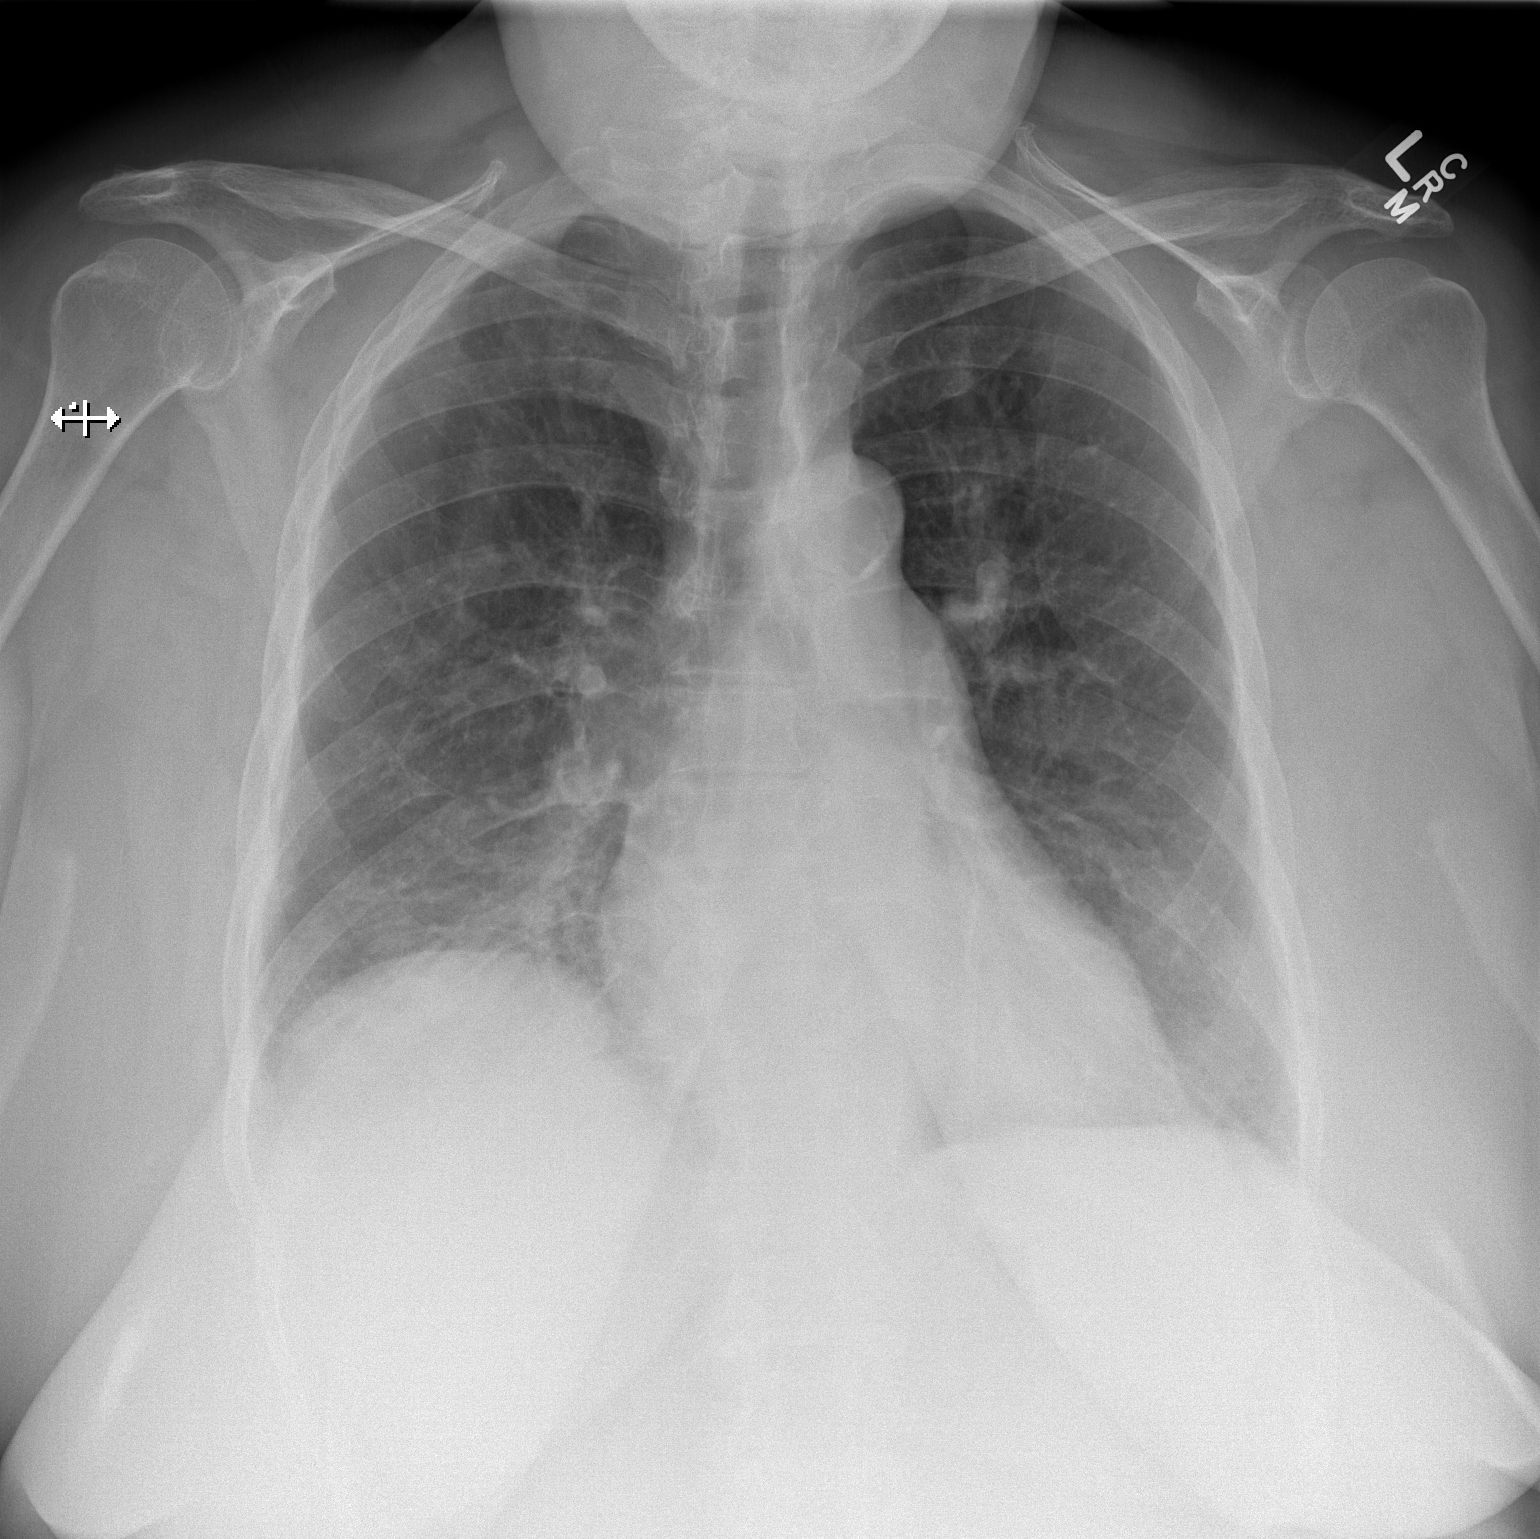

[w chest lat]
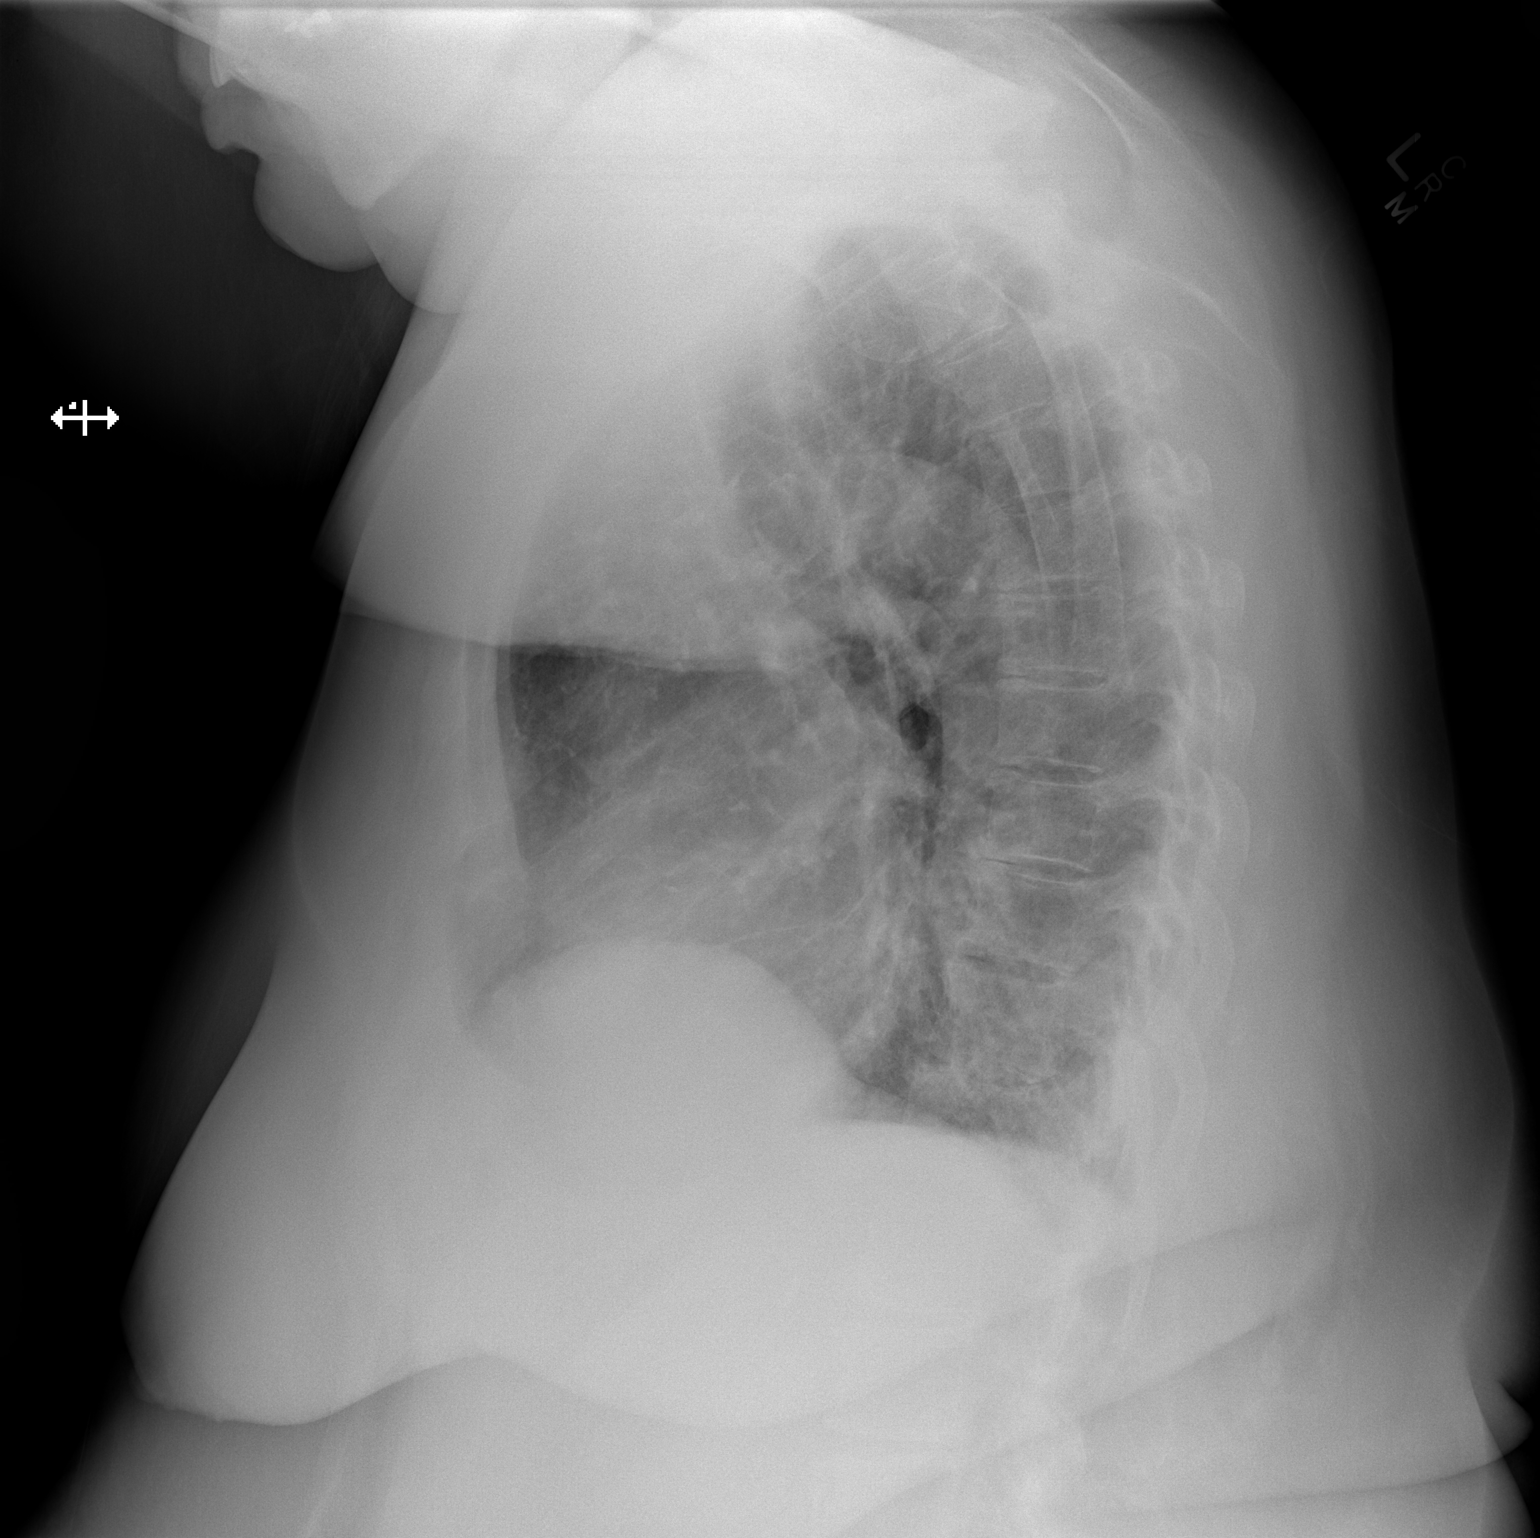

[2 of 2 positions shown; findings below may reference images not displayed]

FINDINGS: The lungs are well-expanded. The interstitial markings are
chronically increased. There is no alveolar infiltrate. There is no
pleural effusion. The heart and pulmonary vascularity are normal.
There is calcification in the wall of the aortic arch. The bony
thorax exhibits no acute abnormality. There is resorption of the
distal aspects of both clavicles consistent with known rheumatoid
arthritis.
IMPRESSION: 1. Chronic interstitial prominence may reflect the patient's smoking
history or may reflect pulmonary involvement by known rheumatoid
arthritis.
2. Aortic atherosclerosis.

## 2015-10-22 NOTE — Pre-Procedure Instructions (Signed)
[name]  10/22/2015      [facility], [place] [address] Phone: [phone] Fax: [phone]    Your procedure is scheduled on 10/24/15.  Report to [facility] Admitting at 1030 A.M.  Call this number if you have problems the morning of surgery:  [phone]   Remember:  Do not eat food or drink liquids after midnight.  Take these medicines the morning of surgery with A SIP OF WATER --lodine,hydrocodone,all inhalers,synthroid   Do not wear jewelry, make-up or nail polish.  Do not wear lotions, powders, or perfumes, or deoderant.  Do not shave 48 hours prior to surgery.  Men may shave face and neck.  Do not bring valuables to the hospital.  [facility] is not responsible for any belongings or valuables.  Contacts, dentures or bridgework may not be worn into surgery.  Leave your suitcase in the car.  After surgery it may be brought to your room.  For patients admitted to the hospital, discharge time will be determined by your treatment team.  Patients discharged the day of surgery will not be allowed to drive home.   Name and phone number of your driver:    Special instructions:  Do not take any aspirin,anti-inflammatories,vitamins,or herbal supplements 5-7 days prior to surgery.  Please read over the following fact sheets that you were given. MRSA Information

## 2015-10-23 MED ORDER — DEXTROSE 5 % IV SOLN
3.0000 g | INTRAVENOUS | Status: AC
  Administered 2015-10-24: 3 g via INTRAVENOUS
  Filled 2015-10-23 (×2): qty 3000

## 2015-10-23 NOTE — Progress Notes (Addendum)
Anesthesia chart review: Patient is 70 year-old female scheduled for revision of right TKA on 10/24/15 by Dr. Lorin Mercy. Surgery was initially scheduled for 05/14/15, but reportedly postponed due to + UA. She was also anemic with H/H 9.9/33.3.  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   Dr. Doylene Canard is her cardiologist/PCP. Clearance note from 04/04/15 on chart, but most recent office note is pending.   Meds include Restasis, etodolac, folvite, Lasix, Norco, Combivent, levothyroxine, methotrexate (on hold for surgery), Lopressor, Zocor.  BP (!) 172/74   Pulse 91   Temp 36.6 C   Resp 20   Ht _0  (1.6 m)   Wt 297 lb 4.8 oz (134.9 kg)   SpO2 95%   BMI 52.66 kg/m   07/30/15 EKG (Dr. Doylene Canard): NSR, right BBB, LPFB, bifascicular block.  03/14/15 Echo (Dr. Doylene Canard): Impression: Normal LV size and systolic function, EF 37%, mild LVH and diastolic dysfunction, mild MR and mild TR.   09/01/06 Cardiac cath (done after abnormal stress test): - LV showed good LV systolic function, EF of 16% to 60%.  - Left main has 15% to 20% distal stenosis.  - LAD has 5% to 10% ostial stenosis and 20% to 25% proximal stenosis. Diagonal 1 and 2 were very small.  - Left circumflex has 20% to 30% proximal stenosis. OM1 was moderate size which has 20% to 25% mid-stenosis. OM-2 and OM III were very small, which were patent.  - RCA was small, nondominant which was patent.   10/22/15 CXR: IMPRESSION: 1. Chronic interstitial prominence may reflect the patient's smoking history or may reflect pulmonary involvement by known rheumatoid arthritis. 2. Aortic atherosclerosis.  Preoperative labs noted. Cr  1.14, glucose 91, H/H 10.4/35.8, PT/INR 14.3/1.11, PTT 38. T&S done. UA showed moderate leukocytes, negative nitrites, cloudy, many bacteria--left voice message with Malachy Mood at Dr. Lorin Mercy' office earlier today. I also called report to Abigail Butts at Dr. Lorin Mercy' office.  Dr. Doylene Canard previously cleared for surgery. UA looks similar to one in April--called to Dr. Lorin Mercy' office. Will defer additional recommendations to surgeon. Further evaluation on the day of surgery by her anesthesia team to ensure no acute changes prior to proceeding.   George Hugh [facility] Short Stay Center/Anesthesiology Phone [phone] 10/23/2015 2:19 PM   (Addendum: I received a phone call from [facility] at Dr. Lorin Mercy' office. Apparently, Dr. Lorin Mercy had communicated with Dr. Doylene Canard. She is being started on Cipro 500 mg BID X 3 days with plans to proceed with surgery as scheduled.)  George Hugh [facility] Short Stay Center/Anesthesiology Phone [phone] 10/23/2015 6:01 PM

## 2015-10-24 ENCOUNTER — Inpatient Hospital Stay (HOSPITAL_COMMUNITY): Payer: Medicare Other

## 2015-10-24 ENCOUNTER — Inpatient Hospital Stay (HOSPITAL_COMMUNITY): Payer: Medicare Other | Admitting: Certified Registered Nurse Anesthetist

## 2015-10-24 ENCOUNTER — Encounter (HOSPITAL_COMMUNITY): Payer: Self-pay | Admitting: *Deleted

## 2015-10-24 ENCOUNTER — Inpatient Hospital Stay (HOSPITAL_COMMUNITY): Payer: Medicare Other | Admitting: Vascular Surgery

## 2015-10-24 ENCOUNTER — Inpatient Hospital Stay (HOSPITAL_COMMUNITY)
Admission: RE | Admit: 2015-10-24 | Discharge: 2015-10-29 | DRG: 488 | Disposition: A | Discharge status: Skilled Nursing Facility | Payer: Medicare Other | Source: Ambulatory Visit | Attending: Orthopaedic Surgery | Admitting: Orthopaedic Surgery

## 2015-10-24 ENCOUNTER — Encounter (HOSPITAL_COMMUNITY)
Admission: RE | Disposition: A | Discharge status: Skilled Nursing Facility | Payer: Self-pay | Source: Ambulatory Visit | Attending: Orthopaedic Surgery

## 2015-10-24 DIAGNOSIS — Z87891 Personal history of nicotine dependence: Secondary | ICD-10-CM | POA: Diagnosis not present

## 2015-10-24 DIAGNOSIS — I700 Atherosclerosis of aorta: Secondary | ICD-10-CM | POA: Diagnosis present

## 2015-10-24 DIAGNOSIS — E039 Hypothyroidism, unspecified: Secondary | ICD-10-CM | POA: Diagnosis present

## 2015-10-24 DIAGNOSIS — Z96659 Presence of unspecified artificial knee joint: Secondary | ICD-10-CM

## 2015-10-24 DIAGNOSIS — R262 Difficulty in walking, not elsewhere classified: Secondary | ICD-10-CM

## 2015-10-24 DIAGNOSIS — R918 Other nonspecific abnormal finding of lung field: Secondary | ICD-10-CM | POA: Diagnosis present

## 2015-10-24 DIAGNOSIS — M25561 Pain in right knee: Secondary | ICD-10-CM | POA: Diagnosis present

## 2015-10-24 DIAGNOSIS — E785 Hyperlipidemia, unspecified: Secondary | ICD-10-CM | POA: Diagnosis present

## 2015-10-24 DIAGNOSIS — T8484XA Pain due to internal orthopedic prosthetic devices, implants and grafts, initial encounter: Secondary | ICD-10-CM | POA: Diagnosis present

## 2015-10-24 DIAGNOSIS — M1711 Unilateral primary osteoarthritis, right knee: Secondary | ICD-10-CM | POA: Diagnosis present

## 2015-10-24 DIAGNOSIS — M659 Synovitis and tenosynovitis, unspecified: Secondary | ICD-10-CM | POA: Diagnosis present

## 2015-10-24 DIAGNOSIS — I2510 Atherosclerotic heart disease of native coronary artery without angina pectoris: Secondary | ICD-10-CM | POA: Diagnosis present

## 2015-10-24 DIAGNOSIS — M069 Rheumatoid arthritis, unspecified: Secondary | ICD-10-CM | POA: Diagnosis present

## 2015-10-24 DIAGNOSIS — Z90710 Acquired absence of both cervix and uterus: Secondary | ICD-10-CM | POA: Diagnosis not present

## 2015-10-24 DIAGNOSIS — Z79899 Other long term (current) drug therapy: Secondary | ICD-10-CM | POA: Diagnosis not present

## 2015-10-24 DIAGNOSIS — D649 Anemia, unspecified: Secondary | ICD-10-CM | POA: Diagnosis present

## 2015-10-24 DIAGNOSIS — I10 Essential (primary) hypertension: Secondary | ICD-10-CM | POA: Diagnosis present

## 2015-10-24 DIAGNOSIS — T84012A Broken internal right knee prosthesis, initial encounter: Secondary | ICD-10-CM | POA: Diagnosis present

## 2015-10-24 DIAGNOSIS — Z09 Encounter for follow-up examination after completed treatment for conditions other than malignant neoplasm: Secondary | ICD-10-CM

## 2015-10-24 DIAGNOSIS — Z96651 Presence of right artificial knee joint: Secondary | ICD-10-CM

## 2015-10-24 DIAGNOSIS — Z6841 Body Mass Index (BMI) 40.0 and over, adult: Secondary | ICD-10-CM | POA: Diagnosis not present

## 2015-10-24 HISTORY — PX: TOTAL KNEE REVISION: SHX996

## 2015-10-24 LAB — TYPE AND SCREEN
ABO/RH(D): B POS
ANTIBODY SCREEN: NEGATIVE

## 2015-10-24 IMAGING — CR DG KNEE 1-2V PORT*R*
2 series · 2 of 2 positions shown · non-contrast
Comparison: [DATE]

CLINICAL DATA: Right knee revision, postop chest

EXAM:
PORTABLE RIGHT KNEE - 1-2 VIEW

[AP]
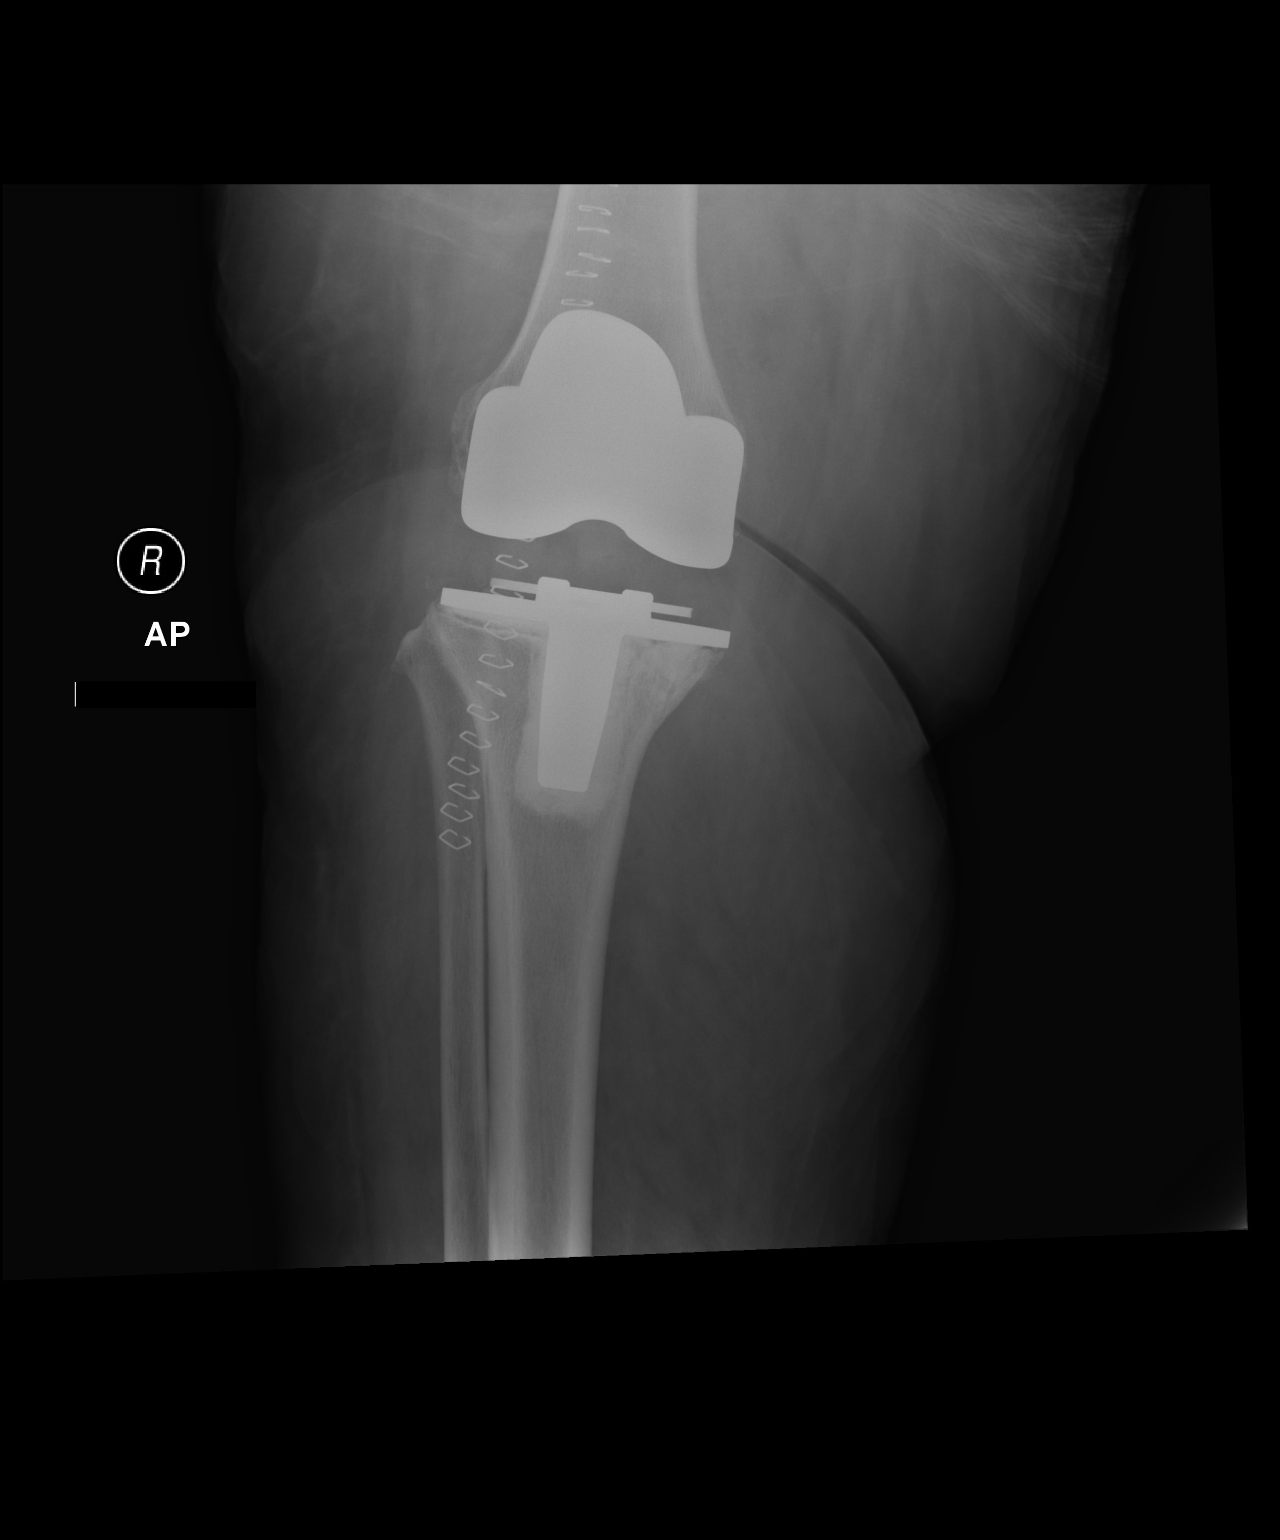

[xtable lateral]
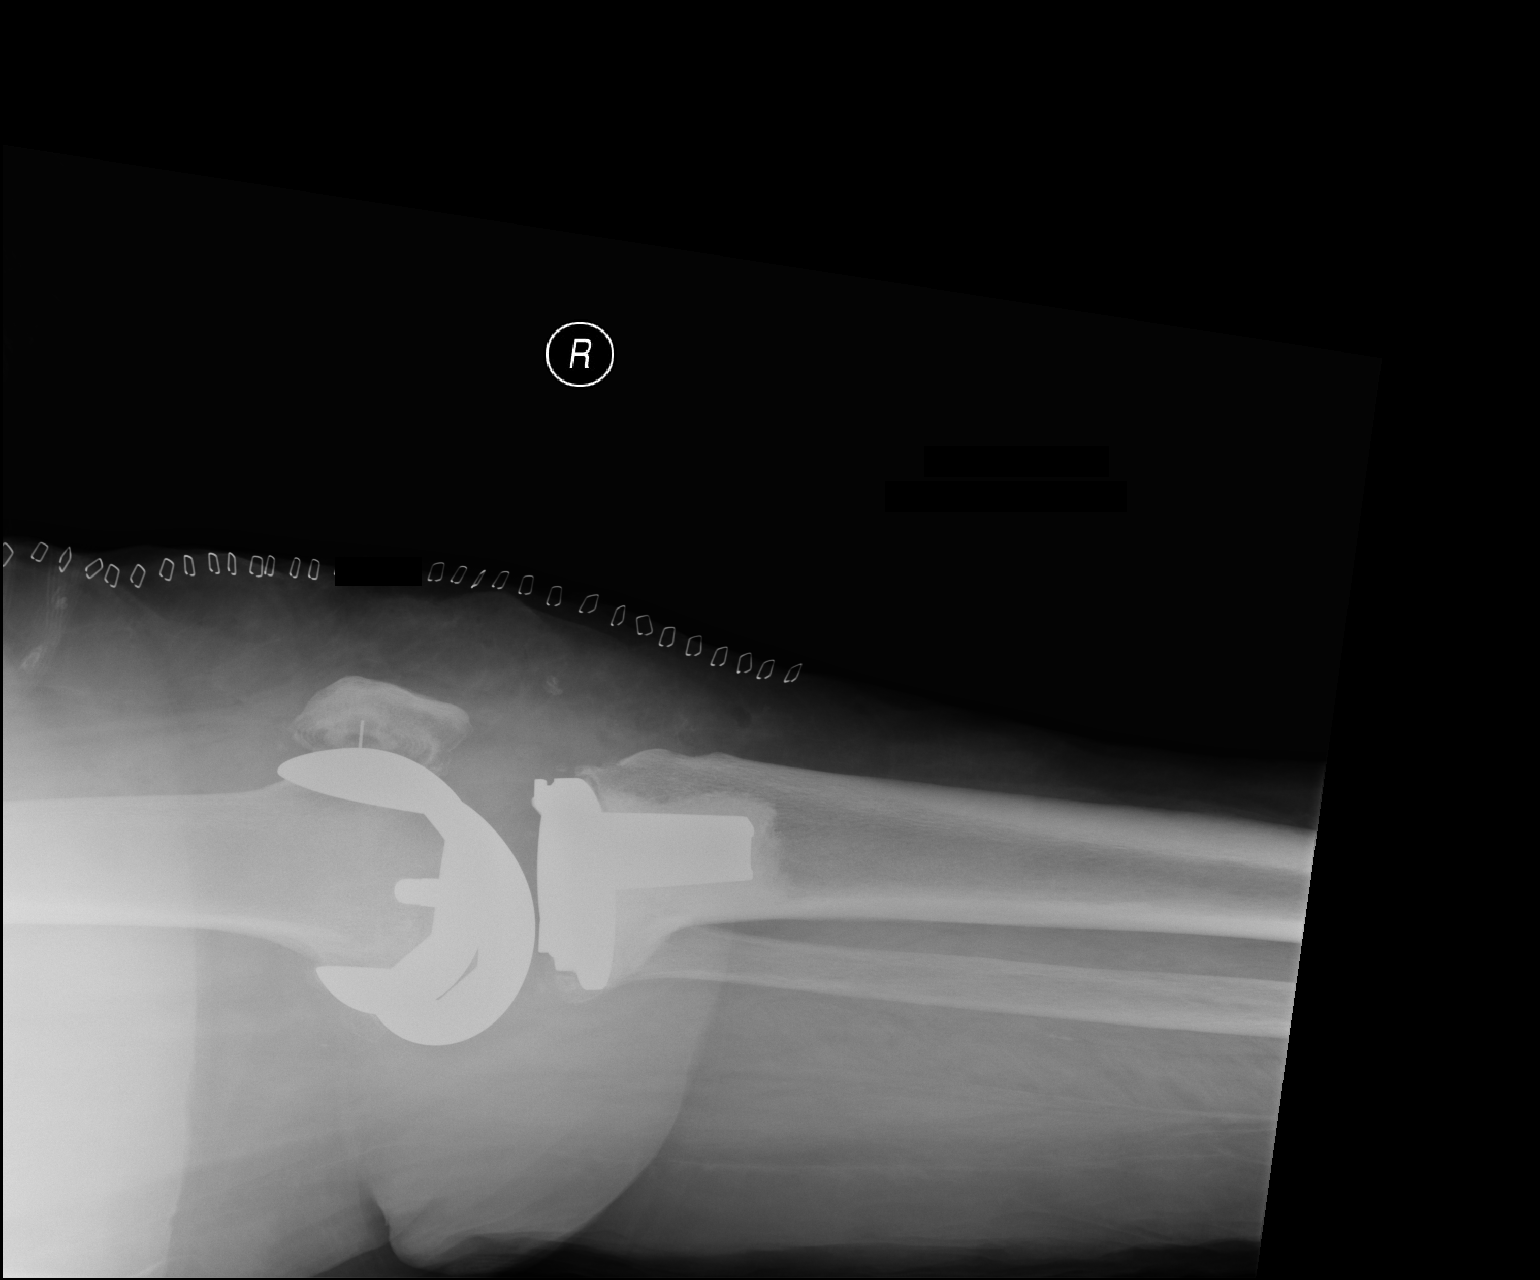

[2 of 2 positions shown; findings below may reference images not displayed]

FINDINGS: Right total knee arthroplasty noted without malalignment. Anterior
skin staples present present. No hardware failure nor postoperative
fracture.
IMPRESSION: Revision of right knee arthroplasty without postoperative fracture.

## 2015-10-24 SURGERY — TOTAL KNEE REVISION
Anesthesia: General | Laterality: Right

## 2015-10-24 MED ORDER — HYDROCODONE-ACETAMINOPHEN 10-325 MG PO TABS
1.0000 | ORAL_TABLET | ORAL | Status: DC | PRN
  Administered 2015-10-24 – 2015-10-29 (×12): 1 via ORAL
  Filled 2015-10-24 (×13): qty 1

## 2015-10-24 MED ORDER — DIPHENHYDRAMINE HCL 25 MG PO CAPS
25.0000 mg | ORAL_CAPSULE | Freq: Four times a day (QID) | ORAL | Status: DC | PRN
  Administered 2015-10-24: 25 mg via ORAL
  Filled 2015-10-24: qty 1

## 2015-10-24 MED ORDER — ASPIRIN EC 325 MG PO TBEC
325.0000 mg | DELAYED_RELEASE_TABLET | Freq: Every day | ORAL | Status: DC
  Administered 2015-10-25 – 2015-10-29 (×5): 325 mg via ORAL
  Filled 2015-10-24 (×5): qty 1

## 2015-10-24 MED ORDER — MENTHOL 3 MG MT LOZG
1.0000 | LOZENGE | OROMUCOSAL | Status: DC | PRN
Start: 2015-10-24 — End: 2015-10-29

## 2015-10-24 MED ORDER — METHOTREXATE 2.5 MG PO TABS
10.0000 mg | ORAL_TABLET | ORAL | Status: DC
Start: 2015-10-25 — End: 2015-10-25

## 2015-10-24 MED ORDER — FENTANYL CITRATE (PF) 100 MCG/2ML IJ SOLN
INTRAMUSCULAR | Status: AC
  Filled 2015-10-24: qty 2

## 2015-10-24 MED ORDER — FENTANYL CITRATE (PF) 100 MCG/2ML IJ SOLN
INTRAMUSCULAR | Status: AC
  Administered 2015-10-24: 100 ug via INTRAVENOUS
  Filled 2015-10-24: qty 2

## 2015-10-24 MED ORDER — PHENYLEPHRINE 40 MCG/ML (10ML) SYRINGE FOR IV PUSH (FOR BLOOD PRESSURE SUPPORT)
PREFILLED_SYRINGE | INTRAVENOUS | Status: AC
  Filled 2015-10-24: qty 10

## 2015-10-24 MED ORDER — FUROSEMIDE 20 MG PO TABS
20.0000 mg | ORAL_TABLET | Freq: Every day | ORAL | Status: DC
  Administered 2015-10-24 – 2015-10-29 (×6): 20 mg via ORAL
  Filled 2015-10-24 (×6): qty 1

## 2015-10-24 MED ORDER — SUCCINYLCHOLINE CHLORIDE 200 MG/10ML IV SOSY
PREFILLED_SYRINGE | INTRAVENOUS | Status: DC | PRN
  Administered 2015-10-24: 140 mg via INTRAVENOUS

## 2015-10-24 MED ORDER — METHOCARBAMOL 500 MG PO TABS
500.0000 mg | ORAL_TABLET | Freq: Four times a day (QID) | ORAL | Status: DC | PRN
  Administered 2015-10-25: 500 mg via ORAL
  Filled 2015-10-24 (×2): qty 1

## 2015-10-24 MED ORDER — MIDAZOLAM HCL 5 MG/5ML IJ SOLN
INTRAMUSCULAR | Status: DC | PRN
  Administered 2015-10-24: 1 mg via INTRAVENOUS

## 2015-10-24 MED ORDER — FENTANYL CITRATE (PF) 100 MCG/2ML IJ SOLN
100.0000 ug | Freq: Once | INTRAMUSCULAR | Status: AC
  Administered 2015-10-24: 100 ug via INTRAVENOUS

## 2015-10-24 MED ORDER — DOCUSATE SODIUM 100 MG PO CAPS
100.0000 mg | ORAL_CAPSULE | Freq: Two times a day (BID) | ORAL | Status: DC
  Administered 2015-10-24 – 2015-10-29 (×9): 100 mg via ORAL
  Filled 2015-10-24 (×10): qty 1

## 2015-10-24 MED ORDER — MEPERIDINE HCL 25 MG/ML IJ SOLN: 6.2500 mg | INTRAMUSCULAR | Status: DC | PRN

## 2015-10-24 MED ORDER — SODIUM CHLORIDE 0.9 % IV SOLN
INTRAVENOUS | Status: DC
  Administered 2015-10-25: 04:00:00 via INTRAVENOUS

## 2015-10-24 MED ORDER — METOCLOPRAMIDE HCL 5 MG/ML IJ SOLN: 5.0000 mg | Freq: Three times a day (TID) | INTRAMUSCULAR | Status: DC | PRN

## 2015-10-24 MED ORDER — METOCLOPRAMIDE HCL 5 MG PO TABS: 5.0000 mg | ORAL_TABLET | Freq: Three times a day (TID) | ORAL | Status: DC | PRN

## 2015-10-24 MED ORDER — PHENOL 1.4 % MT LIQD: 1.0000 | OROMUCOSAL | Status: DC | PRN

## 2015-10-24 MED ORDER — ONDANSETRON HCL 4 MG PO TABS: 4.0000 mg | ORAL_TABLET | Freq: Four times a day (QID) | ORAL | Status: DC | PRN

## 2015-10-24 MED ORDER — CHLORHEXIDINE GLUCONATE 4 % EX LIQD: 60.0000 mL | Freq: Once | CUTANEOUS | Status: DC

## 2015-10-24 MED ORDER — SODIUM CHLORIDE 0.9 % IR SOLN
Status: DC | PRN
  Administered 2015-10-24: 3000 mL

## 2015-10-24 MED ORDER — SIMVASTATIN 40 MG PO TABS
40.0000 mg | ORAL_TABLET | Freq: Every day | ORAL | Status: DC
  Administered 2015-10-24 – 2015-10-29 (×6): 40 mg via ORAL
  Filled 2015-10-24 (×6): qty 1

## 2015-10-24 MED ORDER — HYDROMORPHONE HCL 1 MG/ML IJ SOLN
INTRAMUSCULAR | Status: AC
  Filled 2015-10-24: qty 1

## 2015-10-24 MED ORDER — LIDOCAINE HCL (CARDIAC) 20 MG/ML IV SOLN: INTRAVENOUS | Status: DC | PRN

## 2015-10-24 MED ORDER — LIDOCAINE 2% (20 MG/ML) 5 ML SYRINGE
INTRAMUSCULAR | Status: DC | PRN
  Administered 2015-10-24: 20 mg via INTRAVENOUS

## 2015-10-24 MED ORDER — MIDAZOLAM HCL 2 MG/2ML IJ SOLN
INTRAMUSCULAR | Status: AC
  Filled 2015-10-24: qty 2

## 2015-10-24 MED ORDER — PROPOFOL 10 MG/ML IV BOLUS
INTRAVENOUS | Status: DC | PRN
  Administered 2015-10-24: 150 mg via INTRAVENOUS

## 2015-10-24 MED ORDER — ONDANSETRON HCL 4 MG/2ML IJ SOLN: 4.0000 mg | Freq: Four times a day (QID) | INTRAMUSCULAR | Status: DC | PRN

## 2015-10-24 MED ORDER — LEVOTHYROXINE SODIUM 50 MCG PO TABS
50.0000 ug | ORAL_TABLET | Freq: Every day | ORAL | Status: DC
  Administered 2015-10-25 – 2015-10-29 (×6): 50 ug via ORAL
  Filled 2015-10-24 (×6): qty 1

## 2015-10-24 MED ORDER — PROMETHAZINE HCL 25 MG/ML IJ SOLN: 6.2500 mg | INTRAMUSCULAR | Status: DC | PRN

## 2015-10-24 MED ORDER — LACTATED RINGERS IV SOLN
INTRAVENOUS | Status: DC | PRN
  Administered 2015-10-24 (×2): via INTRAVENOUS

## 2015-10-24 MED ORDER — MIDAZOLAM HCL 2 MG/2ML IJ SOLN
2.0000 mg | Freq: Once | INTRAMUSCULAR | Status: AC
  Administered 2015-10-24: 2 mg via INTRAVENOUS

## 2015-10-24 MED ORDER — PHENYLEPHRINE HCL 10 MG/ML IJ SOLN
INTRAMUSCULAR | Status: DC | PRN
  Administered 2015-10-24: 80 ug via INTRAVENOUS

## 2015-10-24 MED ORDER — ONDANSETRON HCL 4 MG/2ML IJ SOLN
INTRAMUSCULAR | Status: AC
  Filled 2015-10-24: qty 2

## 2015-10-24 MED ORDER — ONDANSETRON HCL 4 MG/2ML IJ SOLN
INTRAMUSCULAR | Status: DC | PRN
  Administered 2015-10-24: 4 mg via INTRAVENOUS

## 2015-10-24 MED ORDER — HYDROMORPHONE HCL 1 MG/ML IJ SOLN
0.2500 mg | INTRAMUSCULAR | Status: DC | PRN
  Administered 2015-10-24 (×4): 0.5 mg via INTRAVENOUS

## 2015-10-24 MED ORDER — IPRATROPIUM-ALBUTEROL 0.5-2.5 (3) MG/3ML IN SOLN: 3.0000 mL | Freq: Two times a day (BID) | RESPIRATORY_TRACT | Status: DC | PRN

## 2015-10-24 MED ORDER — SUGAMMADEX SODIUM 500 MG/5ML IV SOLN
INTRAVENOUS | Status: DC | PRN
  Administered 2015-10-24: 270 mg via INTRAVENOUS

## 2015-10-24 MED ORDER — ACETAMINOPHEN 650 MG RE SUPP: 650.0000 mg | Freq: Four times a day (QID) | RECTAL | Status: DC | PRN

## 2015-10-24 MED ORDER — 0.9 % SODIUM CHLORIDE (POUR BTL) OPTIME
TOPICAL | Status: DC | PRN
Start: 2015-10-24 — End: 2015-10-24
  Administered 2015-10-24: 1000 mL

## 2015-10-24 MED ORDER — MIDAZOLAM HCL 2 MG/2ML IJ SOLN
INTRAMUSCULAR | Status: AC
  Administered 2015-10-24: 2 mg via INTRAVENOUS
  Filled 2015-10-24: qty 2

## 2015-10-24 MED ORDER — METHOCARBAMOL 1000 MG/10ML IJ SOLN
500.0000 mg | Freq: Four times a day (QID) | INTRAVENOUS | Status: DC | PRN
  Administered 2015-10-24: 500 mg via INTRAVENOUS
  Filled 2015-10-24 (×2): qty 5

## 2015-10-24 MED ORDER — ROPIVACAINE HCL 7.5 MG/ML IJ SOLN
INTRAMUSCULAR | Status: DC | PRN
Start: 2015-10-24 — End: 2015-10-24
  Administered 2015-10-24: 20 mL via PERINEURAL

## 2015-10-24 MED ORDER — METOPROLOL TARTRATE 25 MG PO TABS
25.0000 mg | ORAL_TABLET | Freq: Two times a day (BID) | ORAL | Status: DC
  Administered 2015-10-24 – 2015-10-28 (×9): 25 mg via ORAL
  Filled 2015-10-24 (×10): qty 1

## 2015-10-24 MED ORDER — LACTATED RINGERS IV SOLN
INTRAVENOUS | Status: DC
  Administered 2015-10-24: 12:00:00 via INTRAVENOUS

## 2015-10-24 MED ORDER — FENTANYL CITRATE (PF) 100 MCG/2ML IJ SOLN
INTRAMUSCULAR | Status: DC | PRN
  Administered 2015-10-24: 300 ug via INTRAVENOUS
  Administered 2015-10-24: 200 ug via INTRAVENOUS
  Administered 2015-10-24: 50 ug via INTRAVENOUS

## 2015-10-24 MED ORDER — CEFAZOLIN IN D5W 1 GM/50ML IV SOLN
1.0000 g | Freq: Three times a day (TID) | INTRAVENOUS | Status: AC
  Administered 2015-10-24 – 2015-10-25 (×2): 1 g via INTRAVENOUS
  Filled 2015-10-24 (×2): qty 50

## 2015-10-24 MED ORDER — SODIUM CHLORIDE 0.9 % IV SOLN: Freq: Once | INTRAVENOUS | Status: DC

## 2015-10-24 MED ORDER — METOPROLOL TARTRATE 25 MG PO TABS
25.0000 mg | ORAL_TABLET | Freq: Once | ORAL | Status: AC
  Administered 2015-10-24: 25 mg via ORAL
  Filled 2015-10-24: qty 1
  Filled 2015-10-24: qty 2

## 2015-10-24 MED ORDER — ROCURONIUM BROMIDE 10 MG/ML (PF) SYRINGE
PREFILLED_SYRINGE | INTRAVENOUS | Status: DC | PRN
  Administered 2015-10-24: 40 mg via INTRAVENOUS

## 2015-10-24 MED ORDER — FENTANYL CITRATE (PF) 100 MCG/2ML IJ SOLN
INTRAMUSCULAR | Status: AC
  Filled 2015-10-24: qty 4

## 2015-10-24 MED ORDER — POLYETHYLENE GLYCOL 3350 17 G PO PACK: 17.0000 g | PACK | Freq: Every day | ORAL | Status: DC | PRN

## 2015-10-24 MED ORDER — SUCCINYLCHOLINE CHLORIDE 200 MG/10ML IV SOSY
PREFILLED_SYRINGE | INTRAVENOUS | Status: AC
  Filled 2015-10-24: qty 10

## 2015-10-24 MED ORDER — ACETAMINOPHEN 325 MG PO TABS
650.0000 mg | ORAL_TABLET | Freq: Four times a day (QID) | ORAL | Status: DC | PRN
Start: 2015-10-24 — End: 2015-10-29

## 2015-10-24 MED ORDER — SUGAMMADEX SODIUM 500 MG/5ML IV SOLN
INTRAVENOUS | Status: AC
  Filled 2015-10-24: qty 5

## 2015-10-24 MED ORDER — HYDROMORPHONE HCL 1 MG/ML IJ SOLN
0.5000 mg | INTRAMUSCULAR | Status: DC | PRN
  Administered 2015-10-25: 0.5 mg via INTRAVENOUS
  Filled 2015-10-24 (×2): qty 1

## 2015-10-24 MED ORDER — FERROUS SULFATE 325 (65 FE) MG PO TABS
325.0000 mg | ORAL_TABLET | Freq: Every day | ORAL | Status: DC
  Administered 2015-10-24 – 2015-10-29 (×6): 325 mg via ORAL
  Filled 2015-10-24 (×6): qty 1

## 2015-10-24 MED ORDER — CYCLOSPORINE 0.05 % OP EMUL
1.0000 [drp] | Freq: Two times a day (BID) | OPHTHALMIC | Status: DC
  Administered 2015-10-24 – 2015-10-29 (×10): 1 [drp] via OPHTHALMIC
  Filled 2015-10-24 (×11): qty 1

## 2015-10-24 MED ORDER — FOLIC ACID 1 MG PO TABS
1.0000 mg | ORAL_TABLET | Freq: Every day | ORAL | Status: DC
  Administered 2015-10-24 – 2015-10-29 (×6): 1 mg via ORAL
  Filled 2015-10-24 (×6): qty 1

## 2015-10-24 MED ORDER — MIDAZOLAM HCL 2 MG/2ML IJ SOLN
0.5000 mg | Freq: Once | INTRAMUSCULAR | Status: AC | PRN
  Administered 2015-10-24: 0.5 mg via INTRAVENOUS

## 2015-10-24 SURGICAL SUPPLY — 72 items
BANDAGE ACE 4X5 VEL STRL LF (GAUZE/BANDAGES/DRESSINGS) ×3 IMPLANT
BANDAGE ACE 6X5 VEL STRL LF (GAUZE/BANDAGES/DRESSINGS) ×3 IMPLANT
BANDAGE ELASTIC 6 VELCRO ST LF (GAUZE/BANDAGES/DRESSINGS) ×3 IMPLANT
BANDAGE ESMARK 6X9 LF (GAUZE/BANDAGES/DRESSINGS) ×1 IMPLANT
BAR LOCKING TIBIAL (Orthopedic Implant) ×1 IMPLANT
BEARING VGRD TIBIAL KNEE 18X71 (Knees) ×3 IMPLANT
BLADE SAGITTAL 25.0X1.19X90 (BLADE) ×2 IMPLANT
BLADE SAGITTAL 25.0X1.19X90MM (BLADE) ×1
BLADE SAGITTAL 25.0X1.27X90 (BLADE) ×2 IMPLANT
BLADE SAGITTAL 25.0X1.27X90MM (BLADE) ×1
BLADE SAW SGTL 81X20 HD (BLADE) IMPLANT
BLADE SURG ROTATE 9660 (MISCELLANEOUS) IMPLANT
BNDG ESMARK 6X9 LF (GAUZE/BANDAGES/DRESSINGS) ×3
BOWL SMART MIX CTS (DISPOSABLE) IMPLANT
COVER BACK TABLE 24X17X13 BIG (DRAPES) IMPLANT
COVER SURGICAL LIGHT HANDLE (MISCELLANEOUS) ×3 IMPLANT
CUFF TOURNIQUET SINGLE 34IN LL (TOURNIQUET CUFF) IMPLANT
CUFF TOURNIQUET SINGLE 44IN (TOURNIQUET CUFF) ×3 IMPLANT
DISC DIAMOND MED (BURR) IMPLANT
DRAPE IMP U-DRAPE 54X76 (DRAPES) ×3 IMPLANT
DRAPE INCISE IOBAN 66X45 STRL (DRAPES) ×3 IMPLANT
DRAPE ORTHO SPLIT 77X108 STRL (DRAPES) ×4
DRAPE SURG ORHT 6 SPLT 77X108 (DRAPES) ×2 IMPLANT
DRAPE U-SHAPE 47X51 STRL (DRAPES) ×3 IMPLANT
DRSG PAD ABDOMINAL 8X10 ST (GAUZE/BANDAGES/DRESSINGS) ×3 IMPLANT
DURAPREP 26ML APPLICATOR (WOUND CARE) ×6 IMPLANT
ELECT REM PT RETURN 9FT ADLT (ELECTROSURGICAL) ×3
ELECTRODE REM PT RTRN 9FT ADLT (ELECTROSURGICAL) ×1 IMPLANT
EVACUATOR 1/8 PVC DRAIN (DRAIN) IMPLANT
FACESHIELD WRAPAROUND (MASK) ×6 IMPLANT
GAUZE SPONGE 4X4 12PLY STRL (GAUZE/BANDAGES/DRESSINGS) ×3 IMPLANT
GAUZE XEROFORM 1X8 LF (GAUZE/BANDAGES/DRESSINGS) ×3 IMPLANT
GLOVE BIOGEL PI IND STRL 8 (GLOVE) ×2 IMPLANT
GLOVE BIOGEL PI INDICATOR 8 (GLOVE) ×4
GLOVE ORTHO TXT STRL SZ7.5 (GLOVE) ×6 IMPLANT
GOWN STRL REUS W/ TWL LRG LVL3 (GOWN DISPOSABLE) ×1 IMPLANT
GOWN STRL REUS W/ TWL XL LVL3 (GOWN DISPOSABLE) ×1 IMPLANT
GOWN STRL REUS W/TWL 2XL LVL3 (GOWN DISPOSABLE) ×3 IMPLANT
GOWN STRL REUS W/TWL LRG LVL3 (GOWN DISPOSABLE) ×2
GOWN STRL REUS W/TWL XL LVL3 (GOWN DISPOSABLE) ×2
HANDPIECE INTERPULSE COAX TIP (DISPOSABLE)
KIT BASIN OR (CUSTOM PROCEDURE TRAY) ×3 IMPLANT
KIT ROOM TURNOVER OR (KITS) ×3 IMPLANT
MANIFOLD NEPTUNE II (INSTRUMENTS) ×3 IMPLANT
NEEDLE 1/2 CIR MAYO (NEEDLE) ×3 IMPLANT
NS IRRIG 1000ML POUR BTL (IV SOLUTION) ×3 IMPLANT
PACK TOTAL JOINT (CUSTOM PROCEDURE TRAY) ×3 IMPLANT
PACK UNIVERSAL I (CUSTOM PROCEDURE TRAY) IMPLANT
PAD ARMBOARD 7.5X6 YLW CONV (MISCELLANEOUS) ×6 IMPLANT
PAD CAST 4YDX4 CTTN HI CHSV (CAST SUPPLIES) ×1 IMPLANT
PADDING CAST COTTON 4X4 STRL (CAST SUPPLIES) ×2
PADDING CAST COTTON 6X4 STRL (CAST SUPPLIES) ×3 IMPLANT
RASP HELIOCORDIAL MED (MISCELLANEOUS) IMPLANT
SET HNDPC FAN SPRY TIP SCT (DISPOSABLE) IMPLANT
SPONGE GAUZE 4X4 12PLY STER LF (GAUZE/BANDAGES/DRESSINGS) ×3 IMPLANT
STAPLER VISISTAT 35W (STAPLE) ×3 IMPLANT
SUCTION FRAZIER HANDLE 10FR (MISCELLANEOUS)
SUCTION TUBE FRAZIER 10FR DISP (MISCELLANEOUS) IMPLANT
SUT VIC AB 0 CT1 27 (SUTURE) ×4
SUT VIC AB 0 CT1 27XBRD ANBCTR (SUTURE) ×2 IMPLANT
SUT VIC AB 1 CT1 27 (SUTURE) ×4
SUT VIC AB 1 CT1 27XBRD ANBCTR (SUTURE) ×2 IMPLANT
SUT VIC AB 1 CTX 36 (SUTURE) ×4
SUT VIC AB 1 CTX36XBRD ANBCTR (SUTURE) ×2 IMPLANT
SUT VIC AB 2-0 CT1 27 (SUTURE) ×4
SUT VIC AB 2-0 CT1 TAPERPNT 27 (SUTURE) ×2 IMPLANT
TIBIAL LOCKING BAR (Orthopedic Implant) ×3 IMPLANT
TOWEL OR 17X24 6PK STRL BLUE (TOWEL DISPOSABLE) ×3 IMPLANT
TOWEL OR 17X26 10 PK STRL BLUE (TOWEL DISPOSABLE) ×3 IMPLANT
TRAY FOLEY CATH 16FRSI W/METER (SET/KITS/TRAYS/PACK) IMPLANT
TUBE ANAEROBIC SPECIMEN COL (MISCELLANEOUS) IMPLANT
WATER STERILE IRR 1000ML POUR (IV SOLUTION) IMPLANT

## 2015-10-24 NOTE — Progress Notes (Signed)
Orthopedic Tech Progress Note Patient Details:  DMIYA MALPHRUS [birth year] [id number] Ortho visit put on cpm at 1640 Patient ID: [name], female   DOB: 1945-05-26, 70 y.o.   MRN: [id number]   Braulio Bosch 10/24/2015, 6:46 PM

## 2015-10-24 NOTE — Anesthesia Preprocedure Evaluation (Addendum)
Anesthesia Evaluation  Patient identified by MRN, date of birth, ID band Patient awake    Reviewed: Allergy & Precautions, NPO status , Patient's Chart, lab work & pertinent test results, reviewed documented beta blocker date and time   Airway Mallampati: I  TM Distance: >3 FB Neck ROM: Full    Dental  (+) Missing, Dental Advisory Given, Chipped   Pulmonary former smoker (quit 2014),    breath sounds clear to auscultation       Cardiovascular hypertension, Pt. on medications and Pt. on home beta blockers (-) angina Rhythm:Regular Rate:Normal     Neuro/Psych negative neurological ROS     GI/Hepatic negative GI ROS, Neg liver ROS,   Endo/Other  Hypothyroidism Morbid obesity  Renal/GU negative Renal ROS     Musculoskeletal  (+) Arthritis , Osteoarthritis,    Abdominal (+) + obese,   Peds  Hematology  (+) Blood dyscrasia (Hb 10.4), ,   Anesthesia Other Findings   Reproductive/Obstetrics                            Anesthesia Physical Anesthesia Plan  ASA: III  Anesthesia Plan: General   Post-op Pain Management: GA combined w/ Regional for post-op pain   Induction: Intravenous  Airway Management Planned: Oral ETT  Additional Equipment:   Intra-op Plan:   Post-operative Plan: Extubation in OR  Informed Consent: I have reviewed the patients History and Physical, chart, labs and discussed the procedure including the risks, benefits and alternatives for the proposed anesthesia with the patient or authorized representative who has indicated his/her understanding and acceptance.   Dental advisory given  Plan Discussed with: CRNA and Surgeon  Anesthesia Plan Comments: (Plan routine monitors, GETA with adductor canal block for post op analgesia)        Anesthesia Quick Evaluation

## 2015-10-24 NOTE — Brief Op Note (Signed)
10/24/2015  3:04 PM  PATIENT:  [name]  70 y.o. female  PRE-OPERATIVE DIAGNOSIS:  Broken Poly Right Total Knee Arthroplasty  POST-OPERATIVE DIAGNOSIS:  Broken Poly Right Total Knee Arthroplasty  PROCEDURE:  Procedure(s): TOTAL KNEE REVISION (Right)  SURGEON:  Surgeon(s) and Role:    * [name], MD - Primary  PHYSICIAN ASSISTANT: Benjiman Core pa-c    ANESTHESIA:   general  EBL:  Total I/O In: 1000 [I.V.:1000] Out: 200 [Blood:200]  BLOOD ADMINISTERED:none  DRAINS: none   LOCAL MEDICATIONS USED:  NONE  SPECIMEN:  No Specimen  DISPOSITION OF SPECIMEN:  N/A  COUNTS:  YES  TOURNIQUET:   Total Tourniquet Time Documented: Thigh (Right) - 40 minutes Total: Thigh (Right) - 40 minutes   DICTATION: .Viviann Spare Dictation  PLAN OF CARE: Admit to inpatient   PATIENT DISPOSITION:  PACU - hemodynamically stable.

## 2015-10-24 NOTE — Progress Notes (Signed)
PT Cancellation Note  Patient Details Name: [name] MRN: [id number] DOB: 22-Feb-1945   Cancelled Treatment:    Reason Eval/Treat Not Completed: Fatigue/lethargy limiting ability to participate;Patient declined, no reason specified;Pain limiting ability to participate. Pt declined PT session at this time secondary to pain and lethargy. PT will continue to f/u with pt as appropriate.   Sidney 10/24/2015, 5:17 PM Sherie Don, [place], DPT [phone]

## 2015-10-24 NOTE — Anesthesia Postprocedure Evaluation (Signed)
Anesthesia Post Note  Patient: [name]  Procedure(s) Performed: Procedure(s) (LRB): TOTAL KNEE REVISION (Right)  Patient location during evaluation: PACU Anesthesia Type: General and Regional Level of consciousness: oriented, patient cooperative and sedated Pain management: pain level controlled Vital Signs Assessment: post-procedure vital signs reviewed and stable Respiratory status: spontaneous breathing, nonlabored ventilation, respiratory function stable and patient connected to nasal cannula oxygen Cardiovascular status: blood pressure returned to baseline and stable Postop Assessment: no signs of nausea or vomiting Anesthetic complications: no    Last Vitals:  Vitals:   10/24/15 1600 10/24/15 1610  BP: (!) 158/69 (!) 156/77  Pulse: 66 68  Resp: 11 11  Temp:  36.1 C    Last Pain:  Vitals:   10/24/15 1610  TempSrc:   PainSc: Asleep        RLE Motor Response: Purposeful movement (10/24/15 1610) RLE Sensation: Full sensation (10/24/15 1610)      Shandell Giovanni,E. Brylee Mcgreal

## 2015-10-24 NOTE — Progress Notes (Signed)
Orthopedic Tech Progress Note Patient Details:  [name] [birth year] [id number] Applied ohf to bed CPM Right Knee CPM Right Knee: On Right Knee Flexion (Degrees): 90 Right Knee Extension (Degrees): 0 Additional Comments: on at 1540  Ortho Devices Type of Ortho Device: Knee Immobilizer Ortho Device/Splint Location: RLE   Braulio Bosch 10/24/2015, 3:53 PM

## 2015-10-24 NOTE — Transfer of Care (Signed)
Immediate Anesthesia Transfer of Care Note  Patient: [name]  Procedure(s) Performed: Procedure(s): TOTAL KNEE REVISION (Right)  Patient Location: PACU  Anesthesia Type:General  Level of Consciousness: awake, alert , oriented and patient cooperative  Airway & Oxygen Therapy: Patient Spontanous Breathing and Patient connected to nasal cannula oxygen  Post-op Assessment: Report given to RN and Post -op Vital signs reviewed and stable  Post vital signs: Reviewed and stable  Last Vitals:  Vitals:   10/24/15 1240 10/24/15 1448  BP: (!) 148/60   Pulse: 65   Resp: 15 (P) 14  Temp:  (P) 36.1 C    Last Pain:  Vitals:   10/24/15 1141  TempSrc: Oral  PainSc:          Complications: No apparent anesthesia complications

## 2015-10-24 NOTE — Interval H&P Note (Signed)
History and Physical Interval Note:  10/24/2015 12:38 PM  [name]  has presented today for surgery, with the diagnosis of Broken Poly Right Total Knee Arthroplasty  The various methods of treatment have been discussed with the patient and family. After consideration of risks, benefits and other options for treatment, the patient has consented to  Procedure(s): TOTAL KNEE REVISION (Right) as a surgical intervention .  The patient's history has been reviewed, patient examined, no change in status, stable for surgery.  I have reviewed the patient's chart and labs.  Questions were answered to the patient's satisfaction.     [name]

## 2015-10-24 NOTE — H&P (Signed)
TOTAL KNEE REVISION ADMISSION H&P  Patient is being admitted for right revision total knee arthroplasty.  Subjective:  Chief Complaint:right knee pain.  HPI: [name], 70 y.o. female, has a history of pain and functional disability in the right knee(s) due to failed previous arthroplasty and patient has failed non-surgical conservative treatments for greater than 12 weeks to include NSAID's and/or analgesics, use of assistive devices and activity modification. The indications for the revision of the total knee arthroplasty are bearing surface wear leading to implant or knee misalignment. Onset of symptoms was gradual starting 10 years ago with gradually worsening course since that time.  Prior procedures on the right knee(s) include arthroplasty.  Patient currently rates pain in the right knee(s) at 10 out of 10 with activity. There is worsening of pain with activity and weight bearing, pain with passive range of motion and joint swelling.  Patient has evidence of prosthetic loosening by imaging studies. This condition presents safety issues increasing the risk of falls.  There is no current active infection.  There are no active problems to display for this patient.  Past Medical History:  Diagnosis Date  . Anemia    takes Ferrous Sulfate daily  . Arthritis   . Chronic bronchitis ([place])    Combivent if needed)  . Colostomy care [facility]) 2004  . Coronary artery disease   . Diverticular disease   . Dizziness   . Dry eyes    uses Restasis daily  . H/O hiatal hernia   . History of blood transfusion    no abnormal reaction noted  . History of colon polyps    benign  . Hyperlipidemia    takes Zocor daily  . Hypertension    takes Metoprolol daily  . Hypothyroidism    takes Synthroid daily  . Nocturia    states only d/t being on Lasix  . Numbness and tingling    left foot  . Peripheral edema    takes Lasix daily  . Rheumatoid arthritis(714.0)    Methotrexate 2 times a week  .  Shortness of breath    with exertion    Past Surgical History:  Procedure Laterality Date  . ABDOMINAL HYSTERECTOMY  1965  . CAPSULOTOMY Bilateral 06/01/2012   Procedure: MINOR CAPSULOTOMY;  Surgeon: Myrtha Mantis., MD;  Location: [facility];  Service: Ophthalmology;  Laterality: Bilateral;  . CATARACT EXTRACTION W/PHACO  01/01/2011   Procedure: CATARACT EXTRACTION PHACO AND INTRAOCULAR LENS PLACEMENT (IOC);  Surgeon: [name], MD;  Location: [facility];  Service: Ophthalmology;  Laterality: Left;  . CATARACT EXTRACTION W/PHACO  12/24/2011   Procedure: CATARACT EXTRACTION PHACO AND INTRAOCULAR LENS PLACEMENT (IOC);  Surgeon: [name], MD;  Location: [facility];  Service: Ophthalmology;  Laterality: Right;  . COLON SURGERY  2002   d/t diverticulosis  . COLONOSCOPY    . COLOSTOMY    . ESOPHAGOGASTRODUODENOSCOPY    . EYE SURGERY    . KNEE ARTHROSCOPY     bilateral  . TONSILLECTOMY    . TOTAL KNEE ARTHROPLASTY  2005/2008   bilateral  . YAG LASER APPLICATION Bilateral 08/29/9935   Procedure: YAG LASER APPLICATION;  Surgeon: Myrtha Mantis., MD;  Location: [facility];  Service: Ophthalmology;  Laterality: Bilateral;    Prescriptions Prior to Admission  Medication Sig Dispense Refill Last Dose  . cycloSPORINE (RESTASIS) 0.05 % ophthalmic emulsion Place 1 drop into both eyes 2 (two) times daily.     Past Week at Unknown time  .  etodolac (LODINE) 400 MG tablet Take 400 mg by mouth 2 (two) times daily.   Past Week at Unknown time  . ferrous sulfate 325 (65 FE) MG tablet Take 325 mg by mouth daily.   10/23/2015 at Unknown time  . folic acid (FOLVITE) 1 MG tablet Take 1 mg by mouth daily.     10/23/2015 at Unknown time  . furosemide (LASIX) 20 MG tablet Take 20 mg by mouth daily.     10/23/2015 at Unknown time  . HYDROcodone-acetaminophen (NORCO) 10-325 MG tablet Take 1 tablet by mouth every 6 (six) hours as needed for moderate pain.   Past Week at Unknown time  . Ipratropium-Albuterol (COMBIVENT  RESPIMAT) 20-100 MCG/ACT AERS respimat Inhale 1 puff into the lungs 2 (two) times daily as needed for wheezing.   Past Week at Unknown time  . levothyroxine (SYNTHROID, LEVOTHROID) 50 MCG tablet Take 50 mcg by mouth daily.     10/23/2015 at Unknown time  . Loperamide HCl (IMODIUM PO) Take 30 mLs by mouth daily as needed. Give after each loose stool.   10/23/2015 at Unknown time  . metoprolol tartrate (LOPRESSOR) 25 MG tablet Take 25 mg by mouth 2 (two) times daily.   10/24/2015 at Unknown time  . simvastatin (ZOCOR) 40 MG tablet Take 40 mg by mouth daily.    10/23/2015 at Unknown time  . methotrexate (RHEUMATREX) 2.5 MG tablet Take 10 mg by mouth 2 (two) times a week. Caution:Chemotherapy. Protect from light. Takes 4 tabs on Sat and Sun   1/12015  . naproxen sodium (ANAPROX) 220 MG tablet Take 440 mg by mouth 2 (two) times daily as needed (pain).   Unknown at Unknown time  . Pseudoeph-Doxylamine-DM-APAP (NYQUIL PO) Take 1 Dose by mouth at bedtime as needed (cold symptoms).   Unknown at Unknown time   No Known Allergies  Social History  Substance Use Topics  . Smoking status: Former Smoker    Quit date: 10/22/2002  . Smokeless tobacco: Never Used     Comment: quit smoking in 2004  . Alcohol use No     Comment: quit drinking in 2004    Family History  Problem Relation Age of Onset  . Anesthesia problems Neg Hx   . Hypotension Neg Hx   . Malignant hyperthermia Neg Hx   . Pseudochol deficiency Neg Hx       Review of Systems  Constitutional: Negative.   HENT: Negative.   Respiratory: Negative.   Cardiovascular: Negative.   Gastrointestinal: Negative.   Genitourinary: Negative.   Musculoskeletal: Positive for joint pain.  Skin: Negative.   Neurological: Negative.   Psychiatric/Behavioral: Negative.      Objective:  Physical Exam  Constitutional: She is oriented to person, place, and time. No distress.  HENT:  Head: Normocephalic.  Eyes: EOM are normal. Pupils are equal, round, and  reactive to light.  Neck: Normal range of motion.  GI: She exhibits no distension.  Musculoskeletal: She exhibits tenderness.  Neurological: She is alert and oriented to person, place, and time.  Skin: Skin is warm and dry.  Psychiatric: She has a normal mood and affect.    Vital signs in last 24 hours: Temp:  [98.4 F (36.9 C)] 98.4 F (36.9 C) (09/27 1141) Pulse Rate:  [71-73] 73 (09/27 1141) Resp:  [20] 20 (09/27 1141) BP: (169)/(67) 169/67 (09/27 1137) SpO2:  [98 %] 98 % (09/27 1141)  Labs:  Estimated body mass index is 52.66 kg/m as calculated from the following:  Height as of 10/22/15: _0  (1.6 m).   Weight as of 10/22/15: 134.9 kg (297 lb 4.8 oz).  Imaging Review Plain radiographs demonstrate There is evidence of loosening of the poly components. The bone quality appears to be good for age and reported activity level.   Assessment/Plan:  End stage arthritis, right knee(s) with failed previous arthroplasty.   The patient history, physical examination, clinical judgment of the provider and imaging studies are consistent with end stage degenerative joint disease of the right knee(s), previous total knee arthroplasty. Revision total knee arthroplasty is deemed medically necessary. The treatment options including medical management, injection therapy, arthroscopy and revision arthroplasty were discussed at length. The risks and benefits of revision total knee arthroplasty were presented and reviewed. The risks due to aseptic loosening, infection, stiffness, patella tracking problems, thromboembolic complications and other imponderables were discussed. The patient acknowledged the explanation, agreed to proceed with the plan and consent was signed. Patient is being admitted for inpatient treatment for surgery, pain control, PT, OT, prophylactic antibiotics, VTE prophylaxis, progressive ambulation and ADL's and discharge planning.The patient is planning to be discharged home with  home health services

## 2015-10-24 NOTE — Anesthesia Procedure Notes (Signed)
Procedure Name: Intubation Date/Time: 10/24/2015 12:58 PM Performed by: Garrison Columbus T Pre-anesthesia Checklist: Patient identified, Emergency Drugs available, Suction available and Patient being monitored Patient Re-evaluated:Patient Re-evaluated prior to inductionOxygen Delivery Method: Circle System Utilized Preoxygenation: Pre-oxygenation with 100% oxygen Intubation Type: IV induction Ventilation: Mask ventilation without difficulty and Two handed mask ventilation required Laryngoscope Size: Miller and 2 Grade View: Grade I Tube type: Oral Tube size: 7.0 mm Number of attempts: 1 Airway Equipment and Method: Stylet and Oral airway Placement Confirmation: ETT inserted through vocal cords under direct vision,  positive ETCO2 and breath sounds checked- equal and bilateral Secured at: 22 cm Tube secured with: Tape Dental Injury: Teeth and Oropharynx as per pre-operative assessment

## 2015-10-25 LAB — BASIC METABOLIC PANEL
ANION GAP: 6 (ref 5–15)
BUN: 17 mg/dL (ref 6–20)
CALCIUM: 9.2 mg/dL (ref 8.9–10.3)
CHLORIDE: 104 mmol/L (ref 101–111)
CO2: 28 mmol/L (ref 22–32)
CREATININE: 1.33 mg/dL — AB (ref 0.44–1.00)
GFR calc non Af Amer: 40 mL/min — ABNORMAL LOW (ref >60)
GFR, EST AFRICAN AMERICAN: 46 mL/min — AB (ref >60)
Glucose, Bld: 97 mg/dL (ref 65–99)
Potassium: 4.1 mmol/L (ref 3.5–5.1)
SODIUM: 138 mmol/L (ref 135–145)

## 2015-10-25 LAB — CBC
HCT: 31.2 % — ABNORMAL LOW (ref 36.0–46.0)
HEMOGLOBIN: 8.9 g/dL — AB (ref 12.0–15.0)
MCH: 26.3 pg (ref 26.0–34.0)
MCHC: 28.5 g/dL — AB (ref 30.0–36.0)
MCV: 92.3 fL (ref 78.0–100.0)
Platelets: 257 10*3/uL (ref 150–400)
RBC: 3.38 MIL/uL — AB (ref 3.87–5.11)
RDW: 15.4 % (ref 11.5–15.5)
WBC: 6.8 10*3/uL (ref 4.0–10.5)

## 2015-10-25 NOTE — Progress Notes (Signed)
Subjective: 1 Day Post-Op Procedure(s) (LRB): TOTAL KNEE REVISION (Right) Patient reports pain as moderate.    Objective: Vital signs in last 24 hours: Temp:  [97 F (36.1 C)-99 F (37.2 C)] 98.7 F (37.1 C) (09/28 0245) Pulse Rate:  [64-78] 75 (09/28 0245) Resp:  [7-21] 17 (09/28 0245) BP: (126-180)/(49-98) 143/56 (09/28 0245) SpO2:  [94 %-100 %] 100 % (09/28 0245)  Intake/Output from previous day: 09/27 0701 - 09/28 0700 In: 1914.2 [I.V.:1809.2; IV Piggyback:105] Out: 200 [Blood:200] Intake/Output this shift: No intake/output data recorded.   Recent Labs  10/22/15 1447 10/25/15 0403  HGB 10.4* 8.9*    Recent Labs  10/22/15 1447 10/25/15 0403  WBC 6.1 6.8  RBC 3.91 3.38*  HCT 35.8* 31.2*  PLT 304 257    Recent Labs  10/22/15 1447 10/25/15 0403  NA 143 138  K 4.5 4.1  CL 112* 104  CO2 25 28  BUN 19 17  CREATININE 1.14* 1.33*  GLUCOSE 91 97  CALCIUM 10.1 9.2    Recent Labs  10/22/15 1447  INR 1.11    Neurologically intact  Assessment/Plan: 1 Day Post-Op Procedure(s) (LRB): TOTAL KNEE REVISION (Right) Up with therapy  Will need SNF  [name] 10/25/2015, 8:04 AM

## 2015-10-25 NOTE — Op Note (Signed)
NAMECEAIRA, [name]                  ACCOUNT NO.:  [id number]  MEDICAL RECORD NO.:  [id number]  LOCATION:  5N11C                        FACILITY:  [facility]  PHYSICIAN:  Yulia Ulrich C. Lorin Mercy, M.D.    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  PROCEDURE:  Right total knee arthroplasty, revision of broken poly, synovectomy, removal of multiple polyethylene fragments.  SURGEON:  Kyndel Egger C. Lorin Mercy, M.D.  ASSISTANT:  Alyson Locket. Ricard Dillon, PA-C, medically necessary and present for the entire procedure.  TOURNIQUET TIME:  Less than 1 hour.  BRIEF HISTORY:  This 70 year old female had previous total knee arthroplasty done by Dr. Marily Memos many years ago.  She has had greater than 10 years of progressive increased pain with subluxation and plain radiograph shows that the femur posteriorly subluxed over the tibia.  She did not have evidence of femoral or tibial component loosening.  DESCRIPTION OF PROCEDURE:  After a proximal thigh tourniquet standard prepping and draping, we prepped all the way to the foot with the potential for revision of both components.  The patient was large at 135 kg.  Ancef 3 g were given prophylactically.  Midline incision was made after time-out procedure, wrapping the leg with an Esmarch and Betadine Steri-Drape x2 had been applied in order to seal the skin.  A medial retinacular incision was made.  There was clear synovitis, multiple fragments of polyethylene that were present with synovitis present. Synovectomy was performed.  Near complete synovectomy suprapatellar, medial and lateral gutters.  Incision had to be extended more proximal and quad had to be split.  Multiple fragments of poly were removed and the polyethylene of the  Biomet prosthesis was removed pulling up the anterior locking ring.  The posterior medial portion was completely worn off, which was allowing the knee to sublux.  This was 71 femur and the polyethylene was 14.  We tried trial originally 14, it was little bit loose, 16 was slightly better, so we selected an 18 permanent polyethylene prosthesis since it was very difficult to get the trials in and out despite use of a bone hook, multiple Homans, the Ree Kida due to the patient's body habitus and extensive time.  She had multiple pieces of polyethylene floating around the knee causing synovitis.  Permanent poly was with great difficulty finally inserted.  Locking ring was introduced anteriorly.  There was full extension, no hyperextension, good balance of the collateral ligaments and flexion/extension.  Pulse lavage was used.  All pieces of broken poly had been removed.  Tourniquet was deflated.  Hemostasis obtained and then standard closure, deep retinaculum, superficial retinaculum, subcu and skin staple closure, postop dressing, knee immobilizer was applied.  The patient tolerated the procedure well.     Virdie Penning C. Lorin Mercy, M.D.     MCY/MEDQ  D:  10/24/2015  T:  10/25/2015  Job:  [id number]

## 2015-10-25 NOTE — Evaluation (Signed)
Physical Therapy Evaluation Patient Details Name: [name] MRN: [id number] DOB: 03/22/1945 Today's Date: 10/25/2015   History of Present Illness  70 y/o female s/p R TKA revision (10/24/15). Pt has a past medical history significant for anemia, chronic bronchitis, colostomy care, coronary artery disease, dizziness, hypperlipidemia, hypertension, hypothyroidism, rheumatoid arthritis, and peripheral edema.  Clinical Impression  Pt admitted with above diagnosis. Pt currently with functional limitations due to the deficits listed below (see PT Problem List).  Pt will benefit from skilled PT to increase their independence and safety with mobility to allow discharge to the venue listed below.  Pt moving at MIN/MOD level with pain limiting her at time of eval.  She lives alone and would benefit from SNF rehab at time of d/c.     Follow Up Recommendations SNF    Equipment Recommendations  None recommended by PT    Recommendations for Other Services       Precautions / Restrictions Precautions Precautions: Fall Required Braces or Orthoses: Knee Immobilizer - Right Knee Immobilizer - Right: On except when in CPM Restrictions Weight Bearing Restrictions: Yes RLE Weight Bearing: Weight bearing as tolerated      Mobility  Bed Mobility Overal bed mobility: Needs Assistance Bed Mobility: Supine to Sit     Supine to sit: Min assist;HOB elevated     General bed mobility comments: MIN A for LE, able to get trunk upright  Transfers Overall transfer level: Needs assistance Equipment used: Rolling walker (2 wheeled) Transfers: Sit to/from Stand Sit to Stand: Min assist;From elevated surface Stand pivot transfers: Min assist       General transfer comment: Stood with MIN A from elevated bed and performed SPT to [facility].  MOD A stand from [facility] and then turned 180 degrees to recliner with pain with R LE WB.  Ambulation/Gait             General Gait Details: SPT only  Stairs             Wheelchair Mobility    Modified Rankin (Stroke Patients Only)       Balance Overall balance assessment: Needs assistance Sitting-balance support: Single extremity supported;Feet supported Sitting balance-Leahy Scale: Fair     Standing balance support: Bilateral upper extremity supported;During functional activity Standing balance-Leahy Scale: Poor Standing balance comment: requires UE support.  Was able to stand with unilateral support while performing hygiene after using BSC.                             Pertinent Vitals/Pain Pain Assessment: 0-10 Pain Score: 6  Pain Location: R knee Pain Descriptors / Indicators: Discomfort;Operative site guarding;Grimacing Pain Intervention(s): Premedicated before session;Limited activity within patient's tolerance;Repositioned    Home Living Family/patient expects to be discharged to:: Skilled nursing facility Living Arrangements: Alone Available Help at Discharge: Family Type of Home: House Home Access: Ramped entrance     Home Layout: One level Home Equipment: Walker - 4 wheels;Grab bars - tub/shower;Shower seat;Bedside commode      Prior Function Level of Independence: Independent with assistive device(s)         Comments: Amb with rollator     Hand Dominance   Dominant Hand: Right    Extremity/Trunk Assessment   Upper Extremity Assessment: Generalized weakness           Lower Extremity Assessment: RLE deficits/detail RLE Deficits / Details: Decreased strength and ROM as expected post-operatively.  Communication   Communication: No difficulties  Cognition Arousal/Alertness: Lethargic Behavior During Therapy: WFL for tasks assessed/performed Overall Cognitive Status: Within Functional Limits for tasks assessed                      General Comments      Exercises Total Joint Exercises Ankle Circles/Pumps: AROM;Both;10 reps;Supine Quad Sets: Strengthening;Right;10  reps Heel Slides: AAROM;Right;10 reps Hip ABduction/ADduction: AAROM;Right;10 reps   Assessment/Plan    PT Assessment Patient needs continued PT services  PT Problem List Decreased strength;Decreased range of motion;Decreased mobility;Decreased knowledge of precautions          PT Treatment Interventions DME instruction;Gait training;Functional mobility training;Therapeutic activities;Therapeutic exercise;Patient/family education    PT Goals (Current goals can be found in the Care Plan section)  Acute Rehab PT Goals Patient Stated Goal: to go to rehab PT Goal Formulation: With patient Time For Goal Achievement: 11/01/15 Potential to Achieve Goals: Good    Frequency 7X/week   Barriers to discharge Decreased caregiver support      Co-evaluation               End of Session Equipment Utilized During Treatment: Gait belt Activity Tolerance: Patient tolerated treatment well;Patient limited by pain Patient left: in chair;with call bell/phone within reach Nurse Communication: Patient requests pain meds         Time: 7871-8367 PT Time Calculation (min) (ACUTE ONLY): 35 min   Charges:   PT Evaluation $PT Eval Moderate Complexity: 1 Procedure PT Treatments $Therapeutic Activity: 8-22 mins   PT G Codes:        Mitul Hallowell LUBECK 10/25/2015, 12:20 PM

## 2015-10-25 NOTE — Evaluation (Signed)
Occupational Therapy Evaluation/Discharge Patient Details Name: [name] MRN: [id number] DOB: Apr 24, 1945 Today's Date: 10/25/2015    History of Present Illness 70 y/o female s/p R TKA revision (10/24/15). Pt has a past medical history significant for anemia, chronic bronchitis, colostomy care, coronary artery disease, dizziness, hypperlipidemia, hypertension, hypothyroidism, rheumatoid arthritis, and peripheral edema.   Clinical Impression   PTA, pt was independent with assistive devices for all ADL but received assistance from a housekeeping service for IADL. Pt s/p R TKA revision and currently requires maximum assistance for LB ADLs, minimum assistance for ADL transfers and functional mobility and minimum assistance for UB ADLs. Pt would benefit from SNF placement for further OT services after D/C from acute setting to maximize independence and safety with ADLs. OT will defer further services to subsequent venue of care.     Follow Up Recommendations  SNF    Equipment Recommendations  Other (comment) (Defer to subsequent venue)    Recommendations for Other Services       Precautions / Restrictions Precautions Precautions: Fall Required Braces or Orthoses: Knee Immobilizer - Right Knee Immobilizer - Right: On except when in CPM Restrictions Weight Bearing Restrictions: Yes RLE Weight Bearing: Weight bearing as tolerated      Mobility Bed Mobility Overal bed mobility: Needs Assistance Bed Mobility: Supine to Sit     Supine to sit: Min assist;HOB elevated     General bed mobility comments: Pt up in chair on OT arrival  Transfers Overall transfer level: Needs assistance Equipment used: Rolling walker (2 wheeled) Transfers: Sit to/from Stand Sit to Stand: Min assist;From elevated surface Stand pivot transfers: Min assist       General transfer comment: Stood with MIN A from elevated bed and performed SPT to [facility].  MOD A stand from [facility] and then turned 180 degrees to  recliner with pain with R LE WB.    Balance Overall balance assessment: Needs assistance Sitting-balance support: Single extremity supported;Feet supported Sitting balance-Leahy Scale: Fair     Standing balance support: Bilateral upper extremity supported;During functional activity Standing balance-Leahy Scale: Poor Standing balance comment: requires UE support.  Was able to stand with unilateral support while performing hygiene after using BSC.                            ADL Overall ADL's : Needs assistance/impaired Eating/Feeding: Supervision/ safety;Set up;Sitting   Grooming: Oral care;Supervision/safety;Set up;Sitting   Upper Body Bathing: Minimal assitance;Sitting   Lower Body Bathing: Sit to/from stand;Maximal assistance   Upper Body Dressing : Minimal assistance;Sitting   Lower Body Dressing: Maximal assistance;Sit to/from stand   Toilet Transfer: Minimal assistance;Ambulation;RW;BSC Toilet Transfer Details (indicate cue type and reason): BSC in room; ambulated approximately 5 feet Toileting- Clothing Manipulation and Hygiene: Sit to/from stand;Minimal assistance Toileting - Clothing Manipulation Details (indicate cue type and reason): Able to complete pericare, requires min assist to maintain balance.     Functional mobility during ADLs: Rolling walker;Minimal assistance General ADL Comments: Min assist for functional ambulation; Increased assistance for LB ADLs.     Vision     Perception     Praxis      Pertinent Vitals/Pain Pain Assessment: 0-10 Pain Score: 6  Pain Location: R knee Pain Descriptors / Indicators: Discomfort;Operative site guarding;Grimacing Pain Intervention(s): Premedicated before session;Monitored during session;Repositioned     Hand Dominance Right   Extremity/Trunk Assessment Upper Extremity Assessment Upper Extremity Assessment: Generalized weakness   Lower Extremity Assessment Lower  Extremity Assessment: RLE  deficits/detail RLE Deficits / Details: Decreased strength and ROM as expected post-operatively. RLE: Unable to fully assess due to pain       Communication Communication Communication: No difficulties   Cognition Arousal/Alertness: Lethargic Behavior During Therapy: WFL for tasks assessed/performed Overall Cognitive Status: Within Functional Limits for tasks assessed                     General Comments       Exercises       Shoulder Instructions      Home Living Family/patient expects to be discharged to:: Skilled nursing facility Living Arrangements: Alone Available Help at Discharge: Family Type of Home: House Home Access: Ramped entrance     [place]: One level     Bathroom Shower/Tub: Tub/shower unit Shower/tub characteristics: Curtain Biochemist, clinical: [place]: Environmental consultant - 4 wheels;Grab bars - tub/shower;Shower seat;Bedside commode          Prior Functioning/Environment Level of Independence: Independent with assistive device(s)        Comments: Amb with rollator        OT Problem List: Decreased strength;Decreased range of motion;Decreased activity tolerance;Decreased knowledge of use of DME or AE;Decreased knowledge of precautions;Pain;Decreased safety awareness;Impaired balance (sitting and/or standing)   OT Treatment/Interventions:      OT Goals(Current goals can be found in the care plan section) Acute Rehab OT Goals Patient Stated Goal: to go to rehab OT Goal Formulation: With patient Time For Goal Achievement: 11/08/15  OT Frequency: Min 1X/week   Barriers to D/C:            Co-evaluation              End of Session Equipment Utilized During Treatment: Gait belt;Rolling walker;Right knee immobilizer CPM Right Knee CPM Right Knee: Off  Activity Tolerance: Patient limited by lethargy;Patient limited by pain Patient left: in chair;with call bell/phone within reach   Time: 1052-1126 OT Time  Calculation (min): 34 min Charges:  OT General Charges $OT Visit: 1 Procedure OT Evaluation $OT Eval Moderate Complexity: 1 Procedure OT Treatments $Self Care/Home Management : 8-22 mins   Norman Herrlich, OTR/L [phone] 10/25/2015, 12:36 PM

## 2015-10-25 NOTE — Progress Notes (Signed)
Physical Therapy Treatment Patient Details Name: [name] MRN: [id number] DOB: 08/08/1945 Today's Date: 10/25/2015    History of Present Illness 70 y/o female s/p R TKA revision (10/24/15). Pt has a past medical history significant for anemia, chronic bronchitis, colostomy care, coronary artery disease, dizziness, hypperlipidemia, hypertension, hypothyroidism, rheumatoid arthritis, and peripheral edema.    PT Comments    Pt performed progression to gait training and reviewed supine exercises.  Pt presents with limited R knee flexion.  Encouraged patient to sit up and eat tray with bed at 75 degree angle.  Will f/u in am.    Follow Up Recommendations  SNF     Equipment Recommendations  None recommended by PT    Recommendations for Other Services       Precautions / Restrictions Precautions Precautions: Fall Required Braces or Orthoses: Knee Immobilizer - Right Knee Immobilizer - Right: On except when in CPM Restrictions Weight Bearing Restrictions: Yes RLE Weight Bearing: Weight bearing as tolerated    Mobility  Bed Mobility   Bed Mobility: Supine to Sit     Supine to sit: Mod assist;HOB elevated     General bed mobility comments: Pt required increased time to advance B LE to edge of bed.    Transfers Overall transfer level: Needs assistance Equipment used: Rolling walker (2 wheeled) Transfers: Sit to/from Stand Sit to Stand: Min assist;Mod assist;From elevated surface (min from [facility] x2 and mod assist from edge of bed.  )         General transfer comment: Bed placed in standard seat height and patient required increased assist from low seated surface.  Pt required cues for hand placement, forward weight shiftng and trunk control    Ambulation/Gait Ambulation/Gait assistance: Mod assist Ambulation Distance (Feet): 12 Feet (x2) Assistive device: Rolling walker (2 wheeled) Gait Pattern/deviations: Step-to pattern;Shuffle;Wide base of support;Antalgic;Decreased  stride length     General Gait Details: Cues for sequencing and forward gaze.  Pt required cues for RW safety to keep hands on RW and avoid leaning on RW when fatigued.  Pt required several standing breaks.  Step by step instruction for backing and turning.     Stairs            Wheelchair Mobility    Modified Rankin (Stroke Patients Only)       Balance Overall balance assessment: Needs assistance   Sitting balance-Leahy Scale: Fair       Standing balance-Leahy Scale: Poor                      Cognition Arousal/Alertness: Awake/alert Behavior During Therapy: WFL for tasks assessed/performed Overall Cognitive Status: Within Functional Limits for tasks assessed                      Exercises Total Joint Exercises Ankle Circles/Pumps: AROM;Both;10 reps;Supine Quad Sets: AROM;Right;10 reps;Supine Heel Slides: AAROM;Right;10 reps;Supine Hip ABduction/ADduction: AAROM;Right;10 reps;Supine    General Comments        Pertinent Vitals/Pain Pain Assessment: 0-10 Pain Score: 6  Pain Location: R knee Pain Descriptors / Indicators: Grimacing;Guarding;Operative site guarding Pain Intervention(s): Monitored during session;Repositioned    Home Living                      Prior Function            PT Goals (current goals can now be found in the care plan section) Acute Rehab PT Goals Patient Stated Goal:  to go to rehab Potential to Achieve Goals: Good Progress towards PT goals: Progressing toward goals    Frequency    7X/week      PT Plan Current plan remains appropriate    Co-evaluation             End of Session Equipment Utilized During Treatment: Gait belt Activity Tolerance: Patient tolerated treatment well;Patient limited by pain Patient left: in chair;with call bell/phone within reach     Time: 1635-1705 PT Time Calculation (min) (ACUTE ONLY): 30 min  Charges:  $Gait Training: 8-22 mins $Therapeutic Activity:  8-22 mins                    G Codes:      Cristela Blue 11-06-2015, 5:11 PM  Governor [place], PTA pager [phone]

## 2015-10-25 NOTE — Progress Notes (Signed)
Orthopedic Tech Progress Note Patient Details:  [name] 29-Jun-1945 [id number]  Patient ID: [name], female   DOB: 18-Dec-1945, 70 y.o.   MRN: [id number] Applied cpm 0-60  [name] 10/25/2015, 6:15 AM

## 2015-10-25 NOTE — NC FL2 (Signed)
[place] MEDICAID FL2 LEVEL OF CARE SCREENING TOOL     IDENTIFICATION  Patient Name: [name] Birthdate: July 31, 1945 Sex: female Admission Date (Current Location): 10/24/2015  [facility] and [place] Number:  Herbalist and Address:  [facility]. [facility], [place] [address] [place], [place] [address]      Provider Number: [id number]  Attending Physician Name and Address:  [name], MD  Relative Name and Phone Number:       Current Level of Care: Hospital Recommended Level of Care: [place] Prior Approval Number:    Date Approved/Denied:   PASRR Number:    Discharge Plan: SNF    Current Diagnoses: Patient Active Problem List   Diagnosis Date Noted  . S/P revision of total knee 10/24/2015    Orientation RESPIRATION BLADDER Height & Weight     Self, Time, Situation, Place  Normal Continent Weight:   Height:     BEHAVIORAL SYMPTOMS/MOOD NEUROLOGICAL BOWEL NUTRITION STATUS      Continent    AMBULATORY STATUS COMMUNICATION OF NEEDS Skin   Limited Assist Verbally Normal                       Personal Care Assistance Level of Assistance  Bathing, Dressing Bathing Assistance: Limited assistance   Dressing Assistance: Limited assistance     Functional Limitations Info             SPECIAL CARE FACTORS FREQUENCY                       Contractures      Additional Factors Info   (Full)               Current Medications (10/25/2015):  This is the current hospital active medication list Current Facility-Administered Medications  Medication Dose Route Frequency Provider Last Rate Last Dose  . 0.9 %  sodium chloride infusion   Intravenous Once Harden Mo, CRNA      . 0.9 %  sodium chloride infusion   Intravenous Continuous Lanae Crumbly, PA-C 95 mL/hr at 10/25/15 0347    . acetaminophen (TYLENOL) tablet 650 mg  650 mg Oral Q6H PRN Lanae Crumbly, PA-C       Or  . acetaminophen (TYLENOL) suppository  650 mg  650 mg Rectal Q6H PRN Lanae Crumbly, PA-C      . aspirin EC tablet 325 mg  325 mg Oral Q breakfast Lanae Crumbly, PA-C   325 mg at 10/25/15 1093  . cycloSPORINE (RESTASIS) 0.05 % ophthalmic emulsion 1 drop  1 drop Both Eyes BID Lanae Crumbly, PA-C   1 drop at 10/25/15 2355  . diphenhydrAMINE (BENADRYL) capsule 25 mg  25 mg Oral Q6H PRN [name], MD   25 mg at 10/24/15 1756  . docusate sodium (COLACE) capsule 100 mg  100 mg Oral BID Lanae Crumbly, PA-C   100 mg at 10/25/15 7322  . ferrous sulfate tablet 325 mg  325 mg Oral Daily Lanae Crumbly, PA-C   325 mg at 10/25/15 0941  . folic acid (FOLVITE) tablet 1 mg  1 mg Oral Daily Lanae Crumbly, PA-C   1 mg at 10/25/15 0254  . furosemide (LASIX) tablet 20 mg  20 mg Oral Daily Lanae Crumbly, PA-C   20 mg at 10/25/15 2706  . HYDROcodone-acetaminophen (NORCO) 10-325 MG per tablet 1 tablet  1  tablet Oral Q4H PRN Lanae Crumbly, PA-C   1 tablet at 10/25/15 6503  . HYDROmorphone (DILAUDID) injection 0.5 mg  0.5 mg Intravenous Q3H PRN Lanae Crumbly, PA-C   0.5 mg at 10/25/15 5465  . ipratropium-albuterol (DUONEB) 0.5-2.5 (3) MG/3ML nebulizer solution 3 mL  3 mL Inhalation BID PRN Lanae Crumbly, PA-C      . lactated ringers infusion   Intravenous Continuous Annye Asa, MD 50 mL/hr at 10/24/15 1136    . levothyroxine (SYNTHROID, LEVOTHROID) tablet 50 mcg  50 mcg Oral QAC breakfast Lanae Crumbly, PA-C   50 mcg at 10/25/15 0804  . menthol-cetylpyridinium (CEPACOL) lozenge 3 mg  1 lozenge Oral PRN Lanae Crumbly, PA-C       Or  . phenol (CHLORASEPTIC) mouth spray 1 spray  1 spray Mouth/Throat PRN Lanae Crumbly, PA-C      . methocarbamol (ROBAXIN) tablet 500 mg  500 mg Oral Q6H PRN Lanae Crumbly, PA-C   500 mg at 10/25/15 0553   Or  . methocarbamol (ROBAXIN) 500 mg in dextrose 5 % 50 mL IVPB  500 mg Intravenous Q6H PRN Lanae Crumbly, PA-C   500 mg at 10/24/15 1527  . methotrexate (RHEUMATREX) tablet 10 mg  10 mg Oral Once per day on Mon Thu James M  Owens, PA-C      . metoCLOPramide [facility]) tablet 5-10 mg  5-10 mg Oral Q8H PRN Lanae Crumbly, PA-C       Or  . metoCLOPramide (REGLAN) injection 5-10 mg  5-10 mg Intravenous Q8H PRN Lanae Crumbly, PA-C      . metoprolol tartrate (LOPRESSOR) tablet 25 mg  25 mg Oral BID Lanae Crumbly, PA-C   25 mg at 10/25/15 6812  . ondansetron (ZOFRAN) tablet 4 mg  4 mg Oral Q6H PRN Lanae Crumbly, PA-C       Or  . ondansetron [facility]) injection 4 mg  4 mg Intravenous Q6H PRN Lanae Crumbly, PA-C      . polyethylene glycol [facility] / GLYCOLAX) packet 17 g  17 g Oral Daily PRN Lanae Crumbly, PA-C      . simvastatin (ZOCOR) tablet 40 mg  40 mg Oral Daily Lanae Crumbly, PA-C   40 mg at 10/25/15 7517     Discharge Medications: Please see discharge summary for a list of discharge medications.  Relevant Imaging Results:  Relevant Lab Results:   Additional Information    Venetia Maxon, [address]

## 2015-10-26 ENCOUNTER — Encounter (HOSPITAL_COMMUNITY): Payer: Self-pay | Admitting: Orthopaedic Surgery

## 2015-10-26 LAB — CBC
HEMATOCRIT: 30.2 % — AB (ref 36.0–46.0)
Hemoglobin: 8.8 g/dL — ABNORMAL LOW (ref 12.0–15.0)
MCH: 26.4 pg (ref 26.0–34.0)
MCHC: 29.1 g/dL — ABNORMAL LOW (ref 30.0–36.0)
MCV: 90.7 fL (ref 78.0–100.0)
PLATELETS: 248 10*3/uL (ref 150–400)
RBC: 3.33 MIL/uL — AB (ref 3.87–5.11)
RDW: 15.1 % (ref 11.5–15.5)
WBC: 8.3 10*3/uL (ref 4.0–10.5)

## 2015-10-26 NOTE — Progress Notes (Signed)
PASRR: [id number]

## 2015-10-26 NOTE — Progress Notes (Signed)
Physical Therapy Treatment Patient Details Name: [name] MRN: [id number] DOB: November 24, 1945 Today's Date: 10/26/2015    History of Present Illness 70 y/o female s/p R TKA revision (10/24/15). Pt has a past medical history significant for anemia, chronic bronchitis, colostomy care, coronary artery disease, dizziness, hypperlipidemia, hypertension, hypothyroidism, rheumatoid arthritis, and peripheral edema.    PT Comments    Current plan remains appropriate.   Follow Up Recommendations  SNF     Equipment Recommendations  None recommended by PT    Recommendations for Other Services       Precautions / Restrictions Precautions Precautions: Fall Required Braces or Orthoses: Knee Immobilizer - Right Knee Immobilizer - Right: On except when in CPM Restrictions Weight Bearing Restrictions: Yes RLE Weight Bearing: Weight bearing as tolerated    Mobility  Bed Mobility Overal bed mobility: Needs Assistance Bed Mobility: Supine to Sit;Sit to Supine     Supine to sit: Min assist;HOB elevated Sit to supine: Mod assist   General bed mobility comments: assist to mobilize bilat LE; cues for sequencing/technique; use of rails  Transfers Overall transfer level: Needs assistance Equipment used: Rolling walker (2 wheeled) Transfers: Sit to/from Stand Sit to Stand: Min guard         General transfer comment: cues for hand placement  Ambulation/Gait Ambulation/Gait assistance: Min assist Ambulation Distance (Feet): 75 Feet Assistive device: Rolling walker (2 wheeled) Gait Pattern/deviations: Step-to pattern;Decreased stance time - right;Decreased step length - left;Decreased step length - right;Decreased weight shift to right;Antalgic;Trunk flexed;Wide base of support     General Gait Details: slow, steady but antalgic gait; cues for posture and increased WB R LE   Stairs            Wheelchair Mobility    Modified Rankin (Stroke Patients Only)       Balance     Sitting balance-Leahy Scale: Fair       Standing balance-Leahy Scale: Poor                      Cognition Arousal/Alertness: Awake/alert Behavior During Therapy: WFL for tasks assessed/performed Overall Cognitive Status: Within Functional Limits for tasks assessed                      Exercises      General Comments        Pertinent Vitals/Pain Pain Assessment: Faces Faces Pain Scale: Hurts a little bit Pain Location: R knee Pain Descriptors / Indicators: Guarding;Sore Pain Intervention(s): Limited activity within patient's tolerance;Monitored during session;Premedicated before session;Repositioned    Home Living                      Prior Function            PT Goals (current goals can now be found in the care plan section) Acute Rehab PT Goals Patient Stated Goal: to go to rehab Progress towards PT goals: Progressing toward goals    Frequency    7X/week      PT Plan Current plan remains appropriate    Co-evaluation             End of Session Equipment Utilized During Treatment: Gait belt Activity Tolerance: Patient tolerated treatment well Patient left: in bed;with call bell/phone within reach     Time: 2500-3704 PT Time Calculation (min) (ACUTE ONLY): 25 min  Charges:  $Gait Training: 8-22 mins $Therapeutic Activity: 8-22 mins  G Codes:      Salina April, PTA Pager: [phone]   10/26/2015, 4:48 PM

## 2015-10-26 NOTE — Care Management Note (Signed)
Case Management Note  Patient Details  Name: KIMIAH HIBNER MRN: [id number] Date of Birth: August 15, 1945  Subjective/Objective:     Right TKA               Action/Plan: Discharge Planning: Chart reviewed. Pt scheduled for SNF placement. CSW following for SNF.   Expected Discharge Date:                  Expected Discharge Plan:  Skilled Nursing Facility  In-House Referral:  Clinical Social Work  Discharge planning Services  CM Consult  Post Acute Care Choice:  NA Choice offered to:  NA  DME Arranged:  N/A DME Agency:  NA  HH Arranged:  NA HH Agency:  NA  Status of Service:  Completed, signed off  If discussed at [place] of Stay Meetings, dates discussed:    Additional Comments:  Erenest Rasher, RN 10/26/2015, 4:42 PM

## 2015-10-26 NOTE — Progress Notes (Signed)
Physical Therapy Treatment Patient Details Name: [name] MRN: [id number] DOB: [birth year] Today's Date: 10/26/2015    History of Present Illness 70 y/o female s/p R TKA revision (10/24/15). Pt has a past medical history significant for anemia, chronic bronchitis, colostomy care, coronary artery disease, dizziness, hypperlipidemia, hypertension, hypothyroidism, rheumatoid arthritis, and peripheral edema.    PT Comments    Pt progressed gait distance from previous session.  Pt required cues for safety and continues to benefit from rehab in a post acute setting to improve independence and increase motion before returning home.    Follow Up Recommendations  SNF     Equipment Recommendations  None recommended by PT    Recommendations for Other Services       Precautions / Restrictions Precautions Precautions: Fall Required Braces or Orthoses: Knee Immobilizer - Right Knee Immobilizer - Right: On except when in CPM Restrictions Weight Bearing Restrictions: Yes RLE Weight Bearing: Weight bearing as tolerated    Mobility  Bed Mobility               General bed mobility comments: Pt received sitting in recliner chair, required min assist with RLE to scoot to edge of chair and for scooting back into recliner chair.    Transfers Overall transfer level: Needs assistance Equipment used: Rolling walker (2 wheeled) Transfers: Sit to/from Stand Sit to Stand: Min assist (from recliner chair.  )         General transfer comment: Cues for hand placement, forward weight shifting and LE positioning.    Ambulation/Gait Ambulation/Gait assistance: Min assist   Assistive device: Rolling walker (2 wheeled) Gait Pattern/deviations: Step-to pattern;Wide base of support;Antalgic;Decreased stride length;Shuffle   Gait velocity interpretation: Below normal speed for age/gender General Gait Details: Slow waddling step to gait pattern.  Cues for upper trunk control and RW safety.  Pt  required x3 standing rest breaks.     Stairs            Wheelchair Mobility    Modified Rankin (Stroke Patients Only)       Balance     Sitting balance-Leahy Scale: Fair       Standing balance-Leahy Scale: Poor                      Cognition Arousal/Alertness: Awake/alert Behavior During Therapy: WFL for tasks assessed/performed Overall Cognitive Status: Within Functional Limits for tasks assessed                      Exercises Total Joint Exercises Ankle Circles/Pumps: AROM;Both;10 reps;Supine Quad Sets: AROM;Right;10 reps;Supine Heel Slides: AAROM;Right;10 reps;Supine Hip ABduction/ADduction: AAROM;Right;10 reps;Supine Straight Leg Raises: AAROM;Right;10 reps;Supine Goniometric ROM: 57 degrees flexion in R knee, difficult to assess if knee flexion is hindered from surgery or soft end feel due to adipose tissue.      General Comments        Pertinent Vitals/Pain Pain Assessment: 0-10 Pain Score: 6  Pain Location: R knee Pain Descriptors / Indicators: Operative site guarding;Guarding;Grimacing Pain Intervention(s): Monitored during session;Repositioned;Ice applied    Home Living                      Prior Function            PT Goals (current goals can now be found in the care plan section) Acute Rehab PT Goals Patient Stated Goal: to go to rehab Potential to Achieve Goals: Good Progress towards PT goals: Progressing  toward goals    Frequency    7X/week      PT Plan Current plan remains appropriate    Co-evaluation             End of Session Equipment Utilized During Treatment: Gait belt Activity Tolerance: Patient tolerated treatment well;Patient limited by pain Patient left: in chair;with call bell/phone within reach     Time: 0945-1012 PT Time Calculation (min) (ACUTE ONLY): 27 min  Charges:  $Gait Training: 8-22 mins $Therapeutic Exercise: 8-22 mins                    G Codes:      Cristela Blue November 21, 2015, 10:18 AM [name], PTA pager [phone]

## 2015-10-26 NOTE — Progress Notes (Signed)
Orthopedic Tech Progress Note Patient Details:  [name] 03/23/1945 [id number]  Patient ID: [name], female   DOB: 05-16-1945, 70 y.o.   MRN: [id number] Came to put pt in cpm. Pt wants to wait to get in cpm. Will call when ready.  Karolee Stamps 10/26/2015, 5:53 AM

## 2015-10-26 NOTE — Clinical Social Work Note (Signed)
Clinical Social Work Assessment  Patient Details  Name: [name] MRN: [id number] Date of Birth: 01/03/1946  Date of referral:  10/26/15               Reason for consult:  Facility Placement                Permission sought to share information with:   (fACILITY) Permission granted to share information::   Acupuncturist)  Name::        Agency::     Relationship::     Contact Information:     Housing/Transportation Living arrangements for the past 2 months:  Single Family Home Source of Information:  Patient Patient Interpreter Needed:  None Criminal Activity/Legal Involvement Pertinent to Current Situation/Hospitalization:  No - Comment as needed Significant Relationships:  Adult Children (Daughter/Keisha [phone]) Lives with:  Self Do you feel safe going back to the place where you live?   (Patient is interested in SNF) Need for family participation in patient care:  Yes (Comment)  Care giving concerns:  Patient states that due to her having her R knee revision she now needs assistance with completing ADLs.   Social Worker assessment / plan:  Patient states that she lives home alone in [place]. Patient informed SW that she had a R knee revision. Patient states that she now is interested in SNF. SW referred pt to SNFs. Employment status:  Unemployed Forensic scientist:  Medicare PT Recommendations:  [place] / Referral to community resources:   (SNF)  Patient/Family's Response to care:  Appropriate.  Patient/Family's Understanding of and Emotional Response to Diagnosis, Current Treatment, and Prognosis:  No questions for SW.  Emotional Assessment Appearance:  Appears stated age Attitude/Demeanor/Rapport:   (Appropriate.) Affect (typically observed):  Accepting Orientation:  Oriented to Self, Oriented to Place, Oriented to  Time, Oriented to Situation Alcohol / Substance use:  Not Applicable Psych involvement (Current and /or in the  community):  No (Comment)  Discharge Needs  Concerns to be addressed:  No discharge needs identified Readmission within the last 30 days:  No Current discharge risk:  None Barriers to Discharge:  No Barriers Identified   Bernita Buffy 10/26/2015, 3:26 PM

## 2015-10-26 NOTE — Progress Notes (Signed)
MD on call paged but was referred to primary MD. Paged primary MD and left a message to call this writer Re: Discharge.

## 2015-10-26 NOTE — Progress Notes (Signed)
Subjective: 2 Days Post-Op Procedure(s) (LRB): TOTAL KNEE REVISION (Right) Patient reports pain as moderate.    Objective: Vital signs in last 24 hours: Temp:  [98.1 F (36.7 C)-99.1 F (37.3 C)] 99.1 F (37.3 C) (09/29 0439) Pulse Rate:  [80-89] 87 (09/29 0439) Resp:  [17-18] 17 (09/29 0439) BP: (116-156)/(56-74) 136/56 (09/29 0439) SpO2:  [92 %-100 %] 92 % (09/29 0439)  Intake/Output from previous day: 09/28 0701 - 09/29 0700 In: 750 [P.O.:750] Out: -  Intake/Output this shift: No intake/output data recorded.   Recent Labs  10/25/15 0403 10/26/15 0621  HGB 8.9* 8.8*    Recent Labs  10/25/15 0403 10/26/15 0621  WBC 6.8 8.3  RBC 3.38* 3.33*  HCT 31.2* 30.2*  PLT 257 248    Recent Labs  10/25/15 0403  NA 138  K 4.1  CL 104  CO2 28  BUN 17  CREATININE 1.33*  GLUCOSE 97  CALCIUM 9.2   No results for input(s): LABPT, INR in the last 72 hours.  dressing changed , incision looks good. OOB to chair.   Assessment/Plan: 2 Days Post-Op Procedure(s) (LRB): TOTAL KNEE REVISION (Right) Discharge to SNF when bed available. KI removed. Using CPM.   [name] 10/26/2015, 8:29 AM

## 2015-10-27 LAB — CBC
HCT: 29.4 % — ABNORMAL LOW (ref 36.0–46.0)
HEMOGLOBIN: 8.6 g/dL — AB (ref 12.0–15.0)
MCH: 26.4 pg (ref 26.0–34.0)
MCHC: 29.3 g/dL — ABNORMAL LOW (ref 30.0–36.0)
MCV: 90.2 fL (ref 78.0–100.0)
Platelets: 238 10*3/uL (ref 150–400)
RBC: 3.26 MIL/uL — AB (ref 3.87–5.11)
RDW: 15.1 % (ref 11.5–15.5)
WBC: 7.4 10*3/uL (ref 4.0–10.5)

## 2015-10-27 NOTE — Progress Notes (Signed)
Physical Therapy Treatment Patient Details Name: [name] MRN: [id number] DOB: [birth year] Today's Date: 10/27/2015    History of Present Illness 70 y/o female s/p R TKA revision (10/24/15). Pt has a past medical history significant for anemia, chronic bronchitis, colostomy care, coronary artery disease, dizziness, hypperlipidemia, hypertension, hypothyroidism, rheumatoid arthritis, and peripheral edema.    PT Comments    Pt presented supine in bed with HOB elevated, awake and willing to participate in therapy session. Pt making slow progress towards achieving her functional goals. Pt still with ROM limitations at R knee (flexion ~ 75 degrees and extension ~ lacking 10 degrees to neutral in sitting). Pt would continue to benefit from skilled physical therapy services at this time while admitted and after d/c to address her limitations in order to improve her overall safety and independence with functional mobility.  Follow Up Recommendations  SNF     Equipment Recommendations  None recommended by PT    Recommendations for Other Services       Precautions / Restrictions Precautions Precautions: Fall Required Braces or Orthoses: Knee Immobilizer - Right Knee Immobilizer - Right: On except when in CPM;Other (comment) (except with PT) Restrictions Weight Bearing Restrictions: Yes RLE Weight Bearing: Weight bearing as tolerated    Mobility  Bed Mobility Overal bed mobility: Needs Assistance Bed Mobility: Supine to Sit     Supine to sit: Mod assist;HOB elevated     General bed mobility comments: pt required increased time, heavy use of bed rails and mod A with movement of R LE and to scoot forwards in sitting EOB  Transfers Overall transfer level: Needs assistance Equipment used: Rolling walker (2 wheeled) Transfers: Sit to/from Stand Sit to Stand: Min guard         General transfer comment: pt required increased time and min guard for  safety  Ambulation/Gait Ambulation/Gait assistance: Min guard Ambulation Distance (Feet): 20 Feet (20' x3 with standing and sitting rest breaks) Assistive device: Rolling walker (2 wheeled) Gait Pattern/deviations: Step-to pattern;Decreased step length - left;Decreased stance time - right;Decreased weight shift to right;Trunk flexed Gait velocity: decreased Gait velocity interpretation: Below normal speed for age/gender General Gait Details: pt moving slowly and required standing and sitting rest breaks secondary to fatigue; pt required VC'ing for forward gaze   Stairs            Wheelchair Mobility    Modified Rankin (Stroke Patients Only)       Balance Overall balance assessment: Needs assistance Sitting-balance support: Feet supported;Bilateral upper extremity supported Sitting balance-Leahy Scale: Poor     Standing balance support: During functional activity;Bilateral upper extremity supported Standing balance-Leahy Scale: Poor Standing balance comment: pt reliant on bilateral UEs on RW                    Cognition Arousal/Alertness: Awake/alert Behavior During Therapy: WFL for tasks assessed/performed Overall Cognitive Status: Within Functional Limits for tasks assessed                      Exercises Total Joint Exercises Long Arc Quad: AROM;Strengthening;Right;10 reps;Seated Knee Flexion: AROM;Strengthening;Right;10 reps;Seated Marching in Standing: AROM;Strengthening;Right;10 reps;Seated    General Comments        Pertinent Vitals/Pain Pain Assessment: Faces Faces Pain Scale: Hurts little more Pain Location: R knee Pain Descriptors / Indicators: Guarding;Grimacing;Moaning Pain Intervention(s): Monitored during session;Repositioned    Home Living  Prior Function            PT Goals (current goals can now be found in the care plan section) Acute Rehab PT Goals Patient Stated Goal: to go to rehab PT  Goal Formulation: With patient Time For Goal Achievement: 11/01/15 Potential to Achieve Goals: Good Progress towards PT goals: Progressing toward goals    Frequency    7X/week      PT Plan Current plan remains appropriate    Co-evaluation             End of Session Equipment Utilized During Treatment: Gait belt Activity Tolerance: Patient limited by fatigue;Patient limited by pain Patient left: in chair;with call bell/phone within reach     Time: 1200-1226 PT Time Calculation (min) (ACUTE ONLY): 26 min  Charges:  $Gait Training: 8-22 mins $Therapeutic Activity: 8-22 mins                    G CodesClearnce Sorrel Pietrina Jagodzinski 10/30/2015, 12:35 PM Sherie Don, [place], DPT [phone]

## 2015-10-27 NOTE — Progress Notes (Signed)
Orthopedic Tech Progress Note Patient Details:  [name] 21-Oct-1945 [id number]  Patient ID: [name], female   DOB: 1945/07/28, 70 y.o.   MRN: [id number] Applied cpm 0-60  Karolee Stamps 10/27/2015, 6:04 AM

## 2015-10-27 NOTE — Progress Notes (Signed)
Subjective: 3 Days Post-Op Procedure(s) (LRB): TOTAL KNEE REVISION (Right) Patient reports pain as moderate.  Reports feeling better overall.  H/H stable.  Objective: Vital signs in last 24 hours: Temp:  [98.2 F (36.8 C)-99 F (37.2 C)] 99 F (37.2 C) (09/30 0358) Pulse Rate:  [74-88] 81 (09/30 1024) Resp:  [18] 18 (09/30 0358) BP: (96-126)/(45-72) 96/61 (09/30 1024) SpO2:  [95 %-98 %] 95 % (09/30 0358)  Intake/Output from previous day: 09/29 0701 - 09/30 0700 In: 780 [P.O.:780] Out: -  Intake/Output this shift: No intake/output data recorded.   Recent Labs  10/25/15 0403 10/26/15 0621 10/27/15 0451  HGB 8.9* 8.8* 8.6*    Recent Labs  10/26/15 0621 10/27/15 0451  WBC 8.3 7.4  RBC 3.33* 3.26*  HCT 30.2* 29.4*  PLT 248 238    Recent Labs  10/25/15 0403  NA 138  K 4.1  CL 104  CO2 28  BUN 17  CREATININE 1.33*  GLUCOSE 97  CALCIUM 9.2   No results for input(s): LABPT, INR in the last 72 hours.  Sensation intact distally Intact pulses distally Dorsiflexion/Plantar flexion intact Incision: scant drainage Compartment soft  Assessment/Plan: 3 Days Post-Op Procedure(s) (LRB): TOTAL KNEE REVISION (Right) Up with therapy Discharge to SNF early next week.  Mcarthur Rossetti 10/27/2015, 11:12 AM

## 2015-10-28 ENCOUNTER — Encounter (HOSPITAL_COMMUNITY): Payer: Self-pay | Admitting: *Deleted

## 2015-10-28 NOTE — Progress Notes (Signed)
Subjective: 4 Days Post-Op Procedure(s) (LRB): TOTAL KNEE REVISION (Right) Patient reports pain as moderate.  Working well with therapy.  Objective: Vital signs in last 24 hours: Temp:  [98 F (36.7 C)-99.4 F (37.4 C)] 98 F (36.7 C) (10/01 0442) Pulse Rate:  [73-84] 84 (10/01 0442) Resp:  [16] 16 (10/01 0442) BP: (96-135)/(52-61) 133/52 (10/01 0442) SpO2:  [97 %-98 %] 97 % (10/01 0442)  Intake/Output from previous day: No intake/output data recorded. Intake/Output this shift: No intake/output data recorded.   Recent Labs  10/26/15 0621 10/27/15 0451  HGB 8.8* 8.6*    Recent Labs  10/26/15 0621 10/27/15 0451  WBC 8.3 7.4  RBC 3.33* 3.26*  HCT 30.2* 29.4*  PLT 248 238   No results for input(s): NA, K, CL, CO2, BUN, CREATININE, GLUCOSE, CALCIUM in the last 72 hours. No results for input(s): LABPT, INR in the last 72 hours.  Sensation intact distally Intact pulses distally Dorsiflexion/Plantar flexion intact Incision: dressing C/D/I Compartment soft  Assessment/Plan: 4 Days Post-Op Procedure(s) (LRB): TOTAL KNEE REVISION (Right) Up with therapy Plan for discharge tomorrow Discharge to SNF  Mcarthur Rossetti 10/28/2015, 8:21 AM

## 2015-10-28 NOTE — Progress Notes (Signed)
Physical Therapy Treatment Patient Details Name: [name] MRN: [id number] DOB: 04/17/1945 Today's Date: 10/28/2015    History of Present Illness 70 y/o female s/p R TKA revision (10/24/15). Pt has a past medical history significant for anemia, chronic bronchitis, colostomy care, coronary artery disease, dizziness, hypperlipidemia, hypertension, hypothyroidism, rheumatoid arthritis, and peripheral edema.    PT Comments    Pt progressing with mobility/PT goals.  Increased gt distance.  Cont with current POC & discharge recommendation of SNF.    Follow Up Recommendations  SNF     Equipment Recommendations  None recommended by PT    Recommendations for Other Services       Precautions / Restrictions Precautions Precautions: Fall Required Braces or Orthoses: Knee Immobilizer - Right Knee Immobilizer - Right: On except when in CPM;Other (comment) Restrictions RLE Weight Bearing: Weight bearing as tolerated    Mobility  Bed Mobility Overal bed mobility: Needs Assistance Bed Mobility: Supine to Sit;Sit to Supine     Supine to sit: Min assist     General bed mobility comments: (A) for RLE management  Transfers Overall transfer level: Needs assistance Equipment used: Rolling walker (2 wheeled) Transfers: Sit to/from Stand Sit to Stand: Min guard         General transfer comment: cues for hand placement.  Guarding for safety  Ambulation/Gait Ambulation/Gait assistance: Min guard Ambulation Distance (Feet): 40 Feet Assistive device: Rolling walker (2 wheeled) Gait Pattern/deviations: Step-to pattern;Decreased weight shift to right;Decreased step length - left Gait velocity: decreased   General Gait Details: slow gait speed.  required frequent standing rest breaks due to fatigue.  relies heavily on RW with UE's.     Stairs            Wheelchair Mobility    Modified Rankin (Stroke Patients Only)       Balance                                    Cognition Arousal/Alertness: Awake/alert Behavior During Therapy: WFL for tasks assessed/performed Overall Cognitive Status: Within Functional Limits for tasks assessed                      Exercises      General Comments        Pertinent Vitals/Pain Pain Score: 0-No pain    Home Living                      Prior Function            PT Goals (current goals can now be found in the care plan section) Acute Rehab PT Goals PT Goal Formulation: With patient Time For Goal Achievement: 11/01/15 Potential to Achieve Goals: Good Progress towards PT goals: Progressing toward goals    Frequency    7X/week      PT Plan Current plan remains appropriate    Co-evaluation             End of Session Equipment Utilized During Treatment: Right knee immobilizer Activity Tolerance: Patient tolerated treatment well Patient left: in bed;with call bell/phone within reach     Time: 0909-0930 PT Time Calculation (min) (ACUTE ONLY): 21 min  Charges:  $Gait Training: 8-22 mins                    G Codes:      Sena Hitch  10/28/2015, 12:31 PM   Sarajane Marek, PTA [phone] 10/28/2015

## 2015-10-29 MED ORDER — HYDROCODONE-ACETAMINOPHEN 10-325 MG PO TABS: 1.0000 | ORAL_TABLET | ORAL | 0 refills | Status: DC | PRN

## 2015-10-29 MED ORDER — ASPIRIN 325 MG PO TABS: 325.0000 mg | ORAL_TABLET | Freq: Every day | ORAL | Status: DC

## 2015-10-29 NOTE — Care Management Note (Signed)
Case Management Note  Patient Details  Name: [name] MRN: [id number] Date of Birth: 1945/03/18  Subjective/Objective:  Pt medically stable for discharge today.                    Action/Plan: Plan dc to [facility] SNF today, per CSW arrangements.    Expected Discharge Date:   10/29/2015               Expected Discharge Plan:  Skilled Nursing Facility  In-House Referral:  Clinical Social Work  Discharge planning Services  CM Consult  Post Acute Care Choice:  NA Choice offered to:  NA  DME Arranged:  N/A DME Agency:  NA  HH Arranged:  NA HH Agency:  NA  Status of Service:  Completed, signed off  If discussed at [employer] of Stay Meetings, dates discussed:    Additional Comments:  Ella Bodo, RN 10/29/2015, 3:38 PM

## 2015-10-29 NOTE — Progress Notes (Signed)
Reviewed discharge instructions/medications with patient.  Called report to [facility]. Patient is ready for discharge.

## 2015-10-29 NOTE — Care Management Important Message (Signed)
Important Message  Patient Details  Name: [name] MRN: [id number] Date of Birth: [birth year]   Medicare Important Message Given:  Yes    [name] 10/29/2015, 1:26 PM

## 2015-10-29 NOTE — Discharge Instructions (Signed)
Weight bearing as tolerated with therapist. OK to shower.   See Dr. Lorin Mercy in 2 wks

## 2015-10-29 NOTE — Progress Notes (Signed)
Physical Therapy Treatment Patient Details Name: [name] MRN: [id number] DOB: [birth year] Today's Date: 10/29/2015    History of Present Illness 70 y/o female s/p R TKA revision (10/24/15). Pt has a past medical history significant for anemia, chronic bronchitis, colostomy care, coronary artery disease, dizziness, hypperlipidemia, hypertension, hypothyroidism, rheumatoid arthritis, and peripheral edema.    PT Comments    Pt remains to require min guard for safety.  Pt reports dizziness with position changes.  Pt should d/c to SNF today.  Follow Up Recommendations  SNF     Equipment Recommendations  None recommended by PT    Recommendations for Other Services       Precautions / Restrictions Precautions Precautions: Fall Restrictions Weight Bearing Restrictions: Yes RLE Weight Bearing: Weight bearing as tolerated    Mobility  Bed Mobility Overal bed mobility: Needs Assistance Bed Mobility: Supine to Sit;Sit to Supine     Supine to sit: Min guard Sit to supine: Min guard   General bed mobility comments: Pt performed with min guard for safety, reports dizziness with change in position.  Pt required cues for hand placement and trunk control.   Transfers Overall transfer level: Needs assistance Equipment used: Rolling walker (2 wheeled) Transfers: Sit to/from Stand Sit to Stand: Min guard Stand pivot transfers: Min guard       General transfer comment: cues for hand placement.  Guarding for safety  Ambulation/Gait Ambulation/Gait assistance: Min guard Ambulation Distance (Feet): 84 Feet Assistive device: Rolling walker (2 wheeled) Gait Pattern/deviations: Step-to pattern;Wide base of support;Antalgic;Decreased stride length Gait velocity: decreased   General Gait Details: Cues for sequencing and weight shifting.  Pt required cues for upper trunk control.  Pt performed without standing rest break.     Stairs            Wheelchair Mobility     Modified Rankin (Stroke Patients Only)       Balance Overall balance assessment: Needs assistance   Sitting balance-Leahy Scale: Fair       Standing balance-Leahy Scale: Fair                      Cognition Arousal/Alertness: Awake/alert Behavior During Therapy: WFL for tasks assessed/performed Overall Cognitive Status: Within Functional Limits for tasks assessed                      Exercises Total Joint Exercises Ankle Circles/Pumps: AROM;Both;10 reps;Supine Quad Sets: AROM;Right;10 reps;Supine Heel Slides: AROM;Right;10 reps;Supine Hip ABduction/ADduction: AAROM;Right;10 reps;Supine Straight Leg Raises: AAROM;Right;10 reps;Supine Goniometric ROM: 60 degrees flexion in R knee    General Comments        Pertinent Vitals/Pain Pain Assessment: 0-10 Pain Score: 4  Pain Location: R knee Pain Descriptors / Indicators: Aching;Grimacing;Guarding Pain Intervention(s): Monitored during session;Repositioned    Home Living                      Prior Function            PT Goals (current goals can now be found in the care plan section) Acute Rehab PT Goals Patient Stated Goal: to go to rehab Potential to Achieve Goals: Good Progress towards PT goals: Progressing toward goals    Frequency    7X/week      PT Plan Current plan remains appropriate    Co-evaluation             End of Session Equipment Utilized During Treatment: Gait belt Activity  Tolerance: Patient tolerated treatment well Patient left: in bed;with call bell/phone within reach     Time: 1136-1159 PT Time Calculation (min) (ACUTE ONLY): 23 min  Charges:  $Gait Training: 8-22 mins $Therapeutic Exercise: 8-22 mins                    G Codes:      Cristela Blue 2015/11/10, 12:03 PM  Governor Rooks, PTA pager [phone]

## 2015-10-29 NOTE — Discharge Summary (Signed)
Physician Discharge Summary  Patient ID: [name] MRN: [id number] DOB/AGE: September 06, 1945 70 y.o.  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 She had problems with urinary tract infections which have been recurrent. She had been on antibiotics by her primary care physician prior to her revision surgery. Patient was admitted after informed consent was taken to the operating room where she underwent removal of the broken poly-patient had posterior subluxed knee with instability severe pain with walking. Polyethylene was removed and a thicker polyethylene was placed for the Biomet total knee arthroplasty. This gave her good stability and she proceeded with the normal postoperative knee revision therapy with weightbearing as tolerated. She was transferred to skilled facility at this time for continued rehabilitation.  Consults: None  Significant Diagnostic Studies: labs: Hemoglobin was stable at 8.8 and 8.9 postoperatively patient was on calf pumpers and aspirin for DVT prophylaxis  Treatments: surgery: Knee revision surgery with removal of broken polyethylene and placement of new polyethylene spacer  Discharge Exam: Blood pressure (!) 114/44, pulse 79, temperature 98.5 F (36.9 C), temperature source Oral, resp. rate 16, SpO2 95 %. Patient was using CPM was flexing 0-80. Progressing with physical therapy.  Disposition: 01-Home or Self Care patient will need to restart her methotrexate in 2 weeks. Methotrexate was held before surgery due to risk of infection and wound healing.  Discharge Instructions    Weight bearing as tolerated    Complete by:  As directed        Medication List    STOP taking these medications    methotrexate 2.5 MG tablet Commonly known as:  RHEUMATREX     TAKE these medications   aspirin 325 MG tablet Commonly known as:  BAYER ASPIRIN Take 1 tablet (325 mg total) by mouth daily. Take daily for 4 wks   COMBIVENT RESPIMAT 20-100 MCG/ACT Aers respimat Generic drug:  Ipratropium-Albuterol Inhale 1 puff into the lungs 2 (two) times daily as needed for wheezing.   cycloSPORINE 0.05 % ophthalmic emulsion Commonly known as:  RESTASIS Place 1 drop into both eyes 2 (two) times daily.   etodolac 400 MG tablet Commonly known as:  LODINE Take 400 mg by mouth 2 (two) times daily.   ferrous sulfate 325 (65 FE) MG tablet Take 325 mg by mouth daily.   folic acid 1 MG tablet Commonly known as:  FOLVITE Take 1 mg by mouth daily.   furosemide 20 MG tablet Commonly known as:  LASIX Take 20 mg by mouth daily.   HYDROcodone-acetaminophen 10-325 MG tablet Commonly known as:  NORCO Take 1 tablet by mouth every 4 (four) hours as needed for severe pain. What changed:  when to take this  reasons to take this   IMODIUM PO Take 30 mLs by mouth daily as needed. Give after each loose stool.   levothyroxine 50 MCG tablet Commonly known as:  SYNTHROID, LEVOTHROID Take 50 mcg by mouth daily.   metoprolol tartrate 25 MG tablet Commonly known as:  LOPRESSOR Take 25 mg by mouth 2 (two) times daily.   naproxen sodium 220 MG tablet Commonly known as:  ANAPROX Take 440 mg by mouth 2 (two) times daily as needed (pain).   NYQUIL PO Take 1 Dose by mouth at bedtime as needed (cold symptoms).  simvastatin 40 MG tablet Commonly known as:  ZOCOR Take 40 mg by mouth daily.      Follow-up Information    [name], MD Follow up in 2 week(s).   Specialty:  Orthopedic Surgery Contact information: [place] [address] [phone]         Follow-up with Dr. Lorin Mercy in 2 weeks. Aspirin 325 mg by mouth daily 4 weeks. Weightbearing as tolerated with physical  therapy  Signed: [name] 10/29/2015, 11:43 AM

## 2015-10-29 NOTE — Progress Notes (Signed)
SW spoke with admissions/ Ivin Booty of [facility]. She states that pt is welcomed to come now. SW arranged transportation through [facility]. SW made nurse aware. SW made pt aware.  Tilda Burrow, MSW [phone]

## 2015-10-30 ENCOUNTER — Encounter: Payer: Self-pay | Admitting: Internal Medicine

## 2015-10-30 ENCOUNTER — Non-Acute Institutional Stay (SKILLED_NURSING_FACILITY): Payer: Medicare Other | Admitting: Internal Medicine

## 2015-10-30 DIAGNOSIS — D62 Acute posthemorrhagic anemia: Secondary | ICD-10-CM

## 2015-10-30 DIAGNOSIS — Z96651 Presence of right artificial knee joint: Secondary | ICD-10-CM | POA: Diagnosis not present

## 2015-10-30 DIAGNOSIS — M069 Rheumatoid arthritis, unspecified: Secondary | ICD-10-CM | POA: Diagnosis not present

## 2015-10-30 DIAGNOSIS — E785 Hyperlipidemia, unspecified: Secondary | ICD-10-CM | POA: Diagnosis not present

## 2015-10-30 DIAGNOSIS — R2681 Unsteadiness on feet: Secondary | ICD-10-CM

## 2015-10-30 DIAGNOSIS — I10 Essential (primary) hypertension: Secondary | ICD-10-CM

## 2015-10-30 DIAGNOSIS — E038 Other specified hypothyroidism: Secondary | ICD-10-CM

## 2015-10-30 DIAGNOSIS — N289 Disorder of kidney and ureter, unspecified: Secondary | ICD-10-CM

## 2015-10-30 NOTE — Progress Notes (Signed)
LOCATION: [place]  PCP: Birdie Riddle, MD   Code Status: Full Code  Goals of care: Advanced Directive information Advanced Directives 10/22/2015  Does patient have an advance directive? No  Would patient like information on creating an advanced directive? -  Pre-existing out of facility DNR order (yellow form or pink MOST form) -       Extended Emergency Contact Information Primary Emergency Contact: Turner,Linda Address: [address]          [place], [place] [address] [place] Phone: [phone] Mobile Phone: [phone] Relation: Sister Secondary Emergency Contact: Verdene Rio Address: [place], [place] [address] [place] Phone: [phone] Mobile Phone: [phone] Relation: Daughter   No Known Allergies  Chief Complaint  Patient presents with  . New Admit To SNF    New Admission Visit     HPI:  Patient is a 70 y.o. 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 She underwent right knee revision arthroplasty with placement of spacer.  She is seen in her room today.   Review of Systems:  Constitutional: Negative for fever, chills, diaphoresis. Energy level is slowly coming back.  HENT: Negative for headache, congestion, nasal discharge Eyes: Negative for blurred vision, double vision and discharge. Wears glasses.  Respiratory: Negative for shortness of breath and wheezing. Positive for occasional cough   Cardiovascular: Negative for chest pain, palpitations, leg swelling.  Gastrointestinal: Negative for heartburn, nausea, vomiting, abdominal pain. Last bowel movement was yesterday. Genitourinary: Negative for dysuria and flank pain.  Musculoskeletal: Negative for back pain, fall in the facilty.  Skin: Negative for itching, rash.  Neurological: Positive for dizziness with  change of position. Psychiatric/Behavioral: Negative for depression   Past Medical History:  Diagnosis Date  . Anemia    takes Ferrous Sulfate daily  . Arthritis   . Chronic bronchitis ([place])    Combivent if needed)  . Colostomy care [facility]) 2004  . Coronary artery disease   . Diverticular disease   . Dizziness   . Dry eyes    uses Restasis daily  . H/O hiatal hernia   . History of blood transfusion    no abnormal reaction noted  . History of colon polyps    benign  . Hyperlipidemia    takes Zocor daily  . Hypertension    takes Metoprolol daily  . Hypothyroidism    takes Synthroid daily  . Nocturia    states only d/t being on Lasix  . Numbness and tingling    left foot  . Peripheral edema    takes Lasix daily  . Rheumatoid arthritis(714.0)    Methotrexate 2 times a week  . Shortness of breath    with exertion   Past Surgical History:  Procedure Laterality Date  . ABDOMINAL HYSTERECTOMY  1965  . CAPSULOTOMY Bilateral 06/01/2012   Procedure: MINOR CAPSULOTOMY;  Surgeon: Myrtha Mantis., MD;  Location: [place];  Service: Ophthalmology;  Laterality: Bilateral;  . CATARACT EXTRACTION W/PHACO  01/01/2011   Procedure: CATARACT EXTRACTION PHACO AND INTRAOCULAR LENS PLACEMENT (IOC);  Surgeon: [name], MD;  Location: [facility];  Service: Ophthalmology;  Laterality: Left;  . CATARACT EXTRACTION W/PHACO  12/24/2011   Procedure: CATARACT EXTRACTION PHACO AND INTRAOCULAR LENS PLACEMENT (IOC);  Surgeon: [name], MD;  Location: [facility];  Service: Ophthalmology;  Laterality:  Right;  Marland Kitchen COLON SURGERY  2002   d/t diverticulosis  . COLONOSCOPY    . COLOSTOMY    . ESOPHAGOGASTRODUODENOSCOPY    . EYE SURGERY    . KNEE ARTHROSCOPY     bilateral  . TONSILLECTOMY    . TOTAL KNEE ARTHROPLASTY  2005/2008   bilateral  . TOTAL KNEE REVISION Right 10/24/2015   Procedure: TOTAL KNEE REVISION;  Surgeon: [name], MD;  Location: [facility];  Service: Orthopedics;  Laterality: Right;  .  YAG LASER APPLICATION Bilateral 02/28/3084   Procedure: YAG LASER APPLICATION;  Surgeon: Myrtha Mantis., MD;  Location: [facility];  Service: Ophthalmology;  Laterality: Bilateral;   Social History:   reports that she quit smoking about 13 years ago. She has never used smokeless tobacco. She reports that she does not drink alcohol or use drugs.  Family History  Problem Relation Age of Onset  . Anesthesia problems Neg Hx   . Hypotension Neg Hx   . Malignant hyperthermia Neg Hx   . Pseudochol deficiency Neg Hx     Medications:   Medication List       Accurate as of 10/30/15  2:11 PM. Always use your most recent med list.          aspirin 325 MG tablet Commonly known as:  BAYER ASPIRIN Take 1 tablet (325 mg total) by mouth daily. Take daily for 4 wks   COMBIVENT RESPIMAT 20-100 MCG/ACT Aers respimat Generic drug:  Ipratropium-Albuterol Inhale 1 puff into the lungs 2 (two) times daily as needed for wheezing.   cycloSPORINE 0.05 % ophthalmic emulsion Commonly known as:  RESTASIS Place 1 drop into both eyes 2 (two) times daily.   etodolac 400 MG tablet Commonly known as:  LODINE Take 400 mg by mouth 2 (two) times daily.   ferrous sulfate 325 (65 FE) MG tablet Take 325 mg by mouth daily.   folic acid 1 MG tablet Commonly known as:  FOLVITE Take 1 mg by mouth daily.   furosemide 20 MG tablet Commonly known as:  LASIX Take 20 mg by mouth daily.   HYDROcodone-acetaminophen 10-325 MG tablet Commonly known as:  NORCO Take 1 tablet by mouth every 4 (four) hours as needed for severe pain.   HYDROcodone-acetaminophen 5-325 MG tablet Commonly known as:  NORCO/VICODIN Take 1 tablet by mouth every 4 (four) hours as needed for moderate pain. Until the Norco 10-325 arrives then D/C 5-325   levothyroxine 50 MCG tablet Commonly known as:  SYNTHROID, LEVOTHROID Take 50 mcg by mouth daily.   methotrexate 2.5 MG tablet Take 2.5 mg by mouth 3 (three) times a week. Start on  11/12/15   metoprolol tartrate 25 MG tablet Commonly known as:  LOPRESSOR Take 25 mg by mouth 2 (two) times daily.   simvastatin 40 MG tablet Commonly known as:  ZOCOR Take 40 mg by mouth daily.       Immunizations: Immunization History  Administered Date(s) Administered  . PPD Test 10/29/2015     Physical Exam:  Vitals:   10/30/15 1406  BP: 139/80  Pulse: 79  Resp: 20  Temp: 98.4 F (36.9 C)  TempSrc: Oral  SpO2: 93%  Weight: 297 lb (134.7 kg)  Height: _0  (1.6 m)   Body mass index is 52.61 kg/m.  General- elderly female, morbidly obese, in no acute distress Head- normocephalic, atraumatic Nose- no nasal discharge Throat- moist mucus membrane Eyes- PERRLA, EOMI, no pallor, no icterus, no discharge, normal conjunctiva,  normal sclera Neck- no cervical lymphadenopathy Cardiovascular- normal s1,s2, no murmur, trace leg edema Respiratory- bilateral clear to auscultation, no wheeze, no rhonchi, no crackles, no use of accessory muscles Abdomen- bowel sounds present, soft, non tender, colostomy bag in place Musculoskeletal- able to move all 4 extremities, limited right knee ROM Neurological- alert and oriented to person, place and time Skin- warm and dry, right knee surgical incision with staples in place, dressing clean and dry Psychiatry- normal mood and affect    Labs reviewed: Basic Metabolic Panel:  Recent Labs  05/04/15 1433 10/22/15 1447 10/25/15 0403  NA 146* 143 138  K 3.9 4.5 4.1  CL 111 112* 104  CO2 _0 GLUCOSE 92 91 97  BUN 21* 19 17  CREATININE 1.07* 1.14* 1.33*  CALCIUM 9.9 10.1 9.2   Liver Function Tests:  Recent Labs  05/04/15 1433 10/22/15 1447  AST 16 19  ALT 9* 10*  ALKPHOS 52 62  BILITOT 0.6 0.7  PROT 6.8 7.8  ALBUMIN 3.6 3.3*   No results for input(s): LIPASE, AMYLASE in the last 8760 hours. No results for input(s): AMMONIA in the last 8760 hours. CBC:  Recent Labs  05/04/15 1433 10/22/15 1447  10/25/15 0403 10/26/15 0621 10/27/15 0451  WBC 5.6 6.1 6.8 8.3 7.4  NEUTROABS 2.5 2.9  --   --   --   HGB 9.9* 10.4* 8.9* 8.8* 8.6*  HCT 33.3* 35.8* 31.2* 30.2* 29.4*  MCV 94.3 91.6 92.3 90.7 90.2  PLT 297 304 257 248 238    Radiological Exams: Dg Chest 2 View  Result Date: 10/22/2015 CLINICAL DATA:  Preoperative examination prior to knee replacement surgery. No current chest complaints. History of rheumatoid arthritis, hypertension, coronary artery disease, former smoker-discontinued in 2004. EXAM: CHEST  2 VIEW COMPARISON:  PA and lateral chest x-ray of February 03, 2013 FINDINGS: The lungs are well-expanded. The interstitial markings are chronically increased. There is no alveolar infiltrate. There is no pleural effusion. The heart and pulmonary vascularity are normal. There is calcification in the wall of the aortic arch. The bony thorax exhibits no acute abnormality. There is resorption of the distal aspects of both clavicles consistent with known rheumatoid arthritis. IMPRESSION: 1. Chronic interstitial prominence may reflect the patient's smoking history or may reflect pulmonary involvement by known rheumatoid arthritis. 2. Aortic atherosclerosis. Electronically Signed   By: David  Martinique M.D.   On: 10/22/2015 16:28   Dg Knee Right Port  Result Date: 10/24/2015 CLINICAL DATA:  Right knee revision, postop chest EXAM: PORTABLE RIGHT KNEE - 1-2 VIEW COMPARISON:  09/18/2015 FINDINGS: Right total knee arthroplasty noted without malalignment. Anterior skin staples present present. No hardware failure nor postoperative fracture. IMPRESSION: Revision of right knee arthroplasty without postoperative fracture. Electronically Signed   By: Ashley Royalty M.D.   On: 10/24/2015 15:39    Assessment/Plan  Unsteady gait With right knee surgery. Will have patient work with PT/OT as tolerated to regain strength and restore function.  Fall precautions are in place.  Right knee failed arthroplasty S/p  revision surgery. Has orthopedic follow up. Will have patient work with PT/OT as tolerated to regain strength and restore function.  Fall precautions are in place. Continue norco 10-325 mg 1 tab q4h prn pain. Continue aspirin 325 mg daily for DVT prophylaxis for 4 weeks.   Blood loss anemia Post surgery, monitor cbc. Continue feso4 daily  Renal impairment Monitor bmp  HTN Continue lopressor 25 mg bid, lasix 20 mg daily. monitor bmp  and BP  Hypothyroidism Continue levothyroxine  HLD Continue simvastatin  RA Continue her Methotrexate and folic acid   Goals of care: short term rehabilitation   Labs/tests ordered: cbc, bmp  Family/ staff Communication: reviewed care plan with patient and nursing supervisor    Blanchie Serve, MD Internal Medicine [place], [place] [address] Cell Phone (Monday-Friday 8 am - 5 pm): [phone] On Call: [phone] and follow prompts after 5 pm and on weekends Office Phone: [phone] Office Fax: [phone]

## 2015-10-31 LAB — CBC AND DIFFERENTIAL
HEMATOCRIT: 29 % — AB (ref 36–46)
HEMOGLOBIN: 8.7 g/dL — AB (ref 12.0–16.0)
Platelets: 275 10*3/uL (ref 150–399)
WBC: 6.4 10*3/mL

## 2015-10-31 LAB — BASIC METABOLIC PANEL
BUN: 23 mg/dL — AB (ref 4–21)
Creatinine: 1.2 mg/dL — AB (ref 0.5–1.1)
GLUCOSE: 93 mg/dL
Potassium: 4.2 mmol/L (ref 3.4–5.3)
SODIUM: 146 mmol/L (ref 137–147)

## 2015-11-05 ENCOUNTER — Non-Acute Institutional Stay (SKILLED_NURSING_FACILITY): Payer: Medicare Other | Admitting: Adult Health

## 2015-11-05 ENCOUNTER — Encounter: Payer: Self-pay | Admitting: Adult Health

## 2015-11-05 DIAGNOSIS — N390 Urinary tract infection, site not specified: Secondary | ICD-10-CM | POA: Diagnosis not present

## 2015-11-05 NOTE — Progress Notes (Signed)
Patient ID: [name], female   DOB: 17-Feb-1945, 70 y.o.   MRN: [id number]    DATE:  11/05/15  MRN:  [id number]  BIRTHDAY: Aug 31, 1945  Facility:  Nursing Home Location:  [facility] Room Number: 413-K  LEVEL OF CARE:  SNF [phone])  Contact Information    Name Relation Home Work Berthold Sister [phone]  [phone]   [facility] Daughter [phone]  [phone]       Code Status History    Date Active Date Inactive Code Status Order ID Comments User Context   10/24/2015  4:29 PM 10/29/2015  7:32 PM Full Code [id number]  Lanae Crumbly, PA-C Inpatient       Chief Complaint  Patient presents with  . Acute Visit    UTI    HISTORY OF PRESENT ILLNESS:  This is a 70 year old female who has is currently having a short-term rehabilitation. Urine culture result showed 50,000 CFU/ml E. Coli. She was having dysuria and increase in urinary frequency.  She has been admitted to [facility] on 10/29/15 from [facility]  Hospitalization 10/24/15 - 10/29/15.  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 For which she had right knee revision arthroplasty with placement of spacer.   PAST MEDICAL HISTORY:  Past Medical History:  Diagnosis Date  . Anemia    takes Ferrous Sulfate daily  . Arthritis   . Chronic bronchitis ([place])    Combivent if needed)  . Colostomy care [facility]) 2004  . Coronary artery disease   . Diverticular disease   . Dizziness   . Dry eyes    uses Restasis daily  . H/O hiatal hernia   . History of blood transfusion    no abnormal reaction noted  . History of colon polyps    benign  . Hyperlipidemia    takes Zocor daily  . Hypertension    takes Metoprolol daily  . Hypothyroidism    takes Synthroid daily  . Nocturia    states only d/t being on Lasix  . Numbness and tingling    left foot  . Peripheral edema    takes Lasix daily  . Rheumatoid arthritis(714.0)    Methotrexate 2  times a week  . Shortness of breath    with exertion     CURRENT MEDICATIONS: Reviewed  Patient's Medications  New Prescriptions   No medications on file  Previous Medications   ASPIRIN (BAYER ASPIRIN) 325 MG TABLET    Take 1 tablet (325 mg total) by mouth daily. Take daily for 4 wks   CYCLOSPORINE (RESTASIS) 0.05 % OPHTHALMIC EMULSION    Place 1 drop into both eyes 2 (two) times daily.    ETODOLAC (LODINE) 400 MG TABLET    Take 400 mg by mouth 2 (two) times daily.    FERROUS SULFATE 325 (65 FE) MG TABLET    Take 325 mg by mouth daily.   FOLIC ACID (FOLVITE) 1 MG TABLET    Take 1 mg by mouth daily.     FUROSEMIDE (LASIX) 20 MG TABLET    Take 20 mg by mouth daily.     HYDROCODONE-ACETAMINOPHEN (NORCO) 10-325 MG TABLET    Take 1 tablet by mouth every 4 (four) hours as needed for severe pain.   IPRATROPIUM-ALBUTEROL (COMBIVENT RESPIMAT) 20-100 MCG/ACT AERS RESPIMAT    Inhale 1 puff into the lungs 4 (four) times daily.    IPRATROPIUM-ALBUTEROL (COMBIVENT) 20-100 MCG/ACT AERS RESPIMAT  Inhale 1 puff into the lungs 2 (two) times daily as needed for wheezing.   LEVOTHYROXINE (SYNTHROID, LEVOTHROID) 50 MCG TABLET    Take 50 mcg by mouth daily.     METHOTREXATE 2.5 MG TABLET    Take 2.5 mg by mouth 2 (two) times a week. Give 4 tablets to = 10 mg on Saturdays and Sundays   METOPROLOL TARTRATE (LOPRESSOR) 25 MG TABLET    Take 25 mg by mouth 2 (two) times daily.    SIMVASTATIN (ZOCOR) 40 MG TABLET    Take 40 mg by mouth daily.   Modified Medications   No medications on file  Discontinued Medications   HYDROCODONE-ACETAMINOPHEN (NORCO/VICODIN) 5-325 MG TABLET    Take 1 tablet by mouth every 4 (four) hours as needed for moderate pain. Until the Norco 10-325 arrives then D/C 5-325   IPRATROPIUM-ALBUTEROL (COMBIVENT RESPIMAT IN)    Inhale 1 puff into the lungs as needed.   SACCHAROMYCES BOULARDII (FLORASTOR) 250 MG CAPSULE    Take 250 mg by mouth 2 (two) times daily. Take for 10 days    SULFAMETHOXAZOLE-TRIMETHOPRIM (BACTRIM DS,SEPTRA DS) 800-160 MG TABLET    Take 1 tablet by mouth 2 (two) times daily.     No Known Allergies   REVIEW OF SYSTEMS:  GENERAL: no change in appetite, no fatigue, no weight changes, no fever, chills or weakness EYES: Denies change in vision, dry eyes, eye pain, itching or discharge EARS: Denies change in hearing, ringing in ears, or earache NOSE: Denies nasal congestion or epistaxis MOUTH and THROAT: Denies oral discomfort, gingival pain or bleeding, pain from teeth or hoarseness   RESPIRATORY: no cough, SOB, DOE, wheezing, hemoptysis CARDIAC: no chest pain, edema or palpitations GI: no abdominal pain, diarrhea, constipation, heart burn, nausea or vomiting GU: Denies dysuria, frequency, hematuria, incontinence, or discharge PSYCHIATRIC: Denies feeling of depression or anxiety. No report of hallucinations, insomnia, paranoia, or agitation    PHYSICAL EXAMINATION  GENERAL APPEARANCE: Well nourished. In no acute distress. Morbidly obese HEAD: Normal in size and contour. No evidence of trauma EYES: Lids open and close normally. No blepharitis, entropion or ectropion. PERRL. Conjunctivae are clear and sclerae are white. Lenses are without opacity EARS: Pinnae are normal. Patient hears normal voice tunes of the examiner MOUTH and THROAT: Lips are without lesions. Oral mucosa is moist and without lesions. Tongue is normal in shape, size, and color and without lesions NECK: supple, trachea midline, no neck masses, no thyroid tenderness, no thyromegaly LYMPHATICS: no LAN in the neck, no supraclavicular LAN RESPIRATORY: breathing is even & unlabored, BS CTAB CARDIAC: RRR, no murmur,no extra heart sounds, no edema GI: abdomen soft, normal BS, no masses, no tenderness, no hepatomegaly, no splenomegaly MUSCULOSKELETAL: No deformities. Movement at each extremity is full and painless. Strength is 5/5 at each extremity. Back is without kyphosis or  scoliosis CIRCULATION: pedal pulses are 2+. There is no edema of the legs, ankles and feet NEUROLOGICAL: There is no tremor. Speech is clear PSYCHIATRIC: Alert and oriented X 3. Affect and behavior are appropriate  LABS/RADIOLOGY: Labs reviewed: Basic Metabolic Panel:  Recent Labs  05/04/15 1433 10/22/15 1447 10/25/15 0403 10/31/15  NA 146* 143 138 146  K 3.9 4.5 4.1 4.2  CL 111 112* 104  --   CO2 _0 --   GLUCOSE 92 91 97  --   BUN 21* 19 17 23*  CREATININE 1.07* 1.14* 1.33* 1.2*  CALCIUM 9.9 10.1 9.2  --  Liver Function Tests:  Recent Labs  05/04/15 1433 10/22/15 1447  AST 16 19  ALT 9* 10*  ALKPHOS 52 62  BILITOT 0.6 0.7  PROT 6.8 7.8  ALBUMIN 3.6 3.3*   CBC:  Recent Labs  05/04/15 1433 10/22/15 1447 10/25/15 0403 10/26/15 0621 10/27/15 0451 10/31/15   WBC 5.6 6.1 6.8 8.3 7.4 6.4   NEUTROABS 2.5 2.9  --   --   --   --    HGB 9.9* 10.4* 8.9* 8.8* 8.6* 8.7*   HCT 33.3* 35.8* 31.2* 30.2* 29.4* 29*   MCV 94.3 91.6 92.3 90.7 90.2  --    PLT 297 304 257 248 238 275      Dg Chest 2 View  Result Date: 10/22/2015 CLINICAL DATA:  Preoperative examination prior to knee replacement surgery. No current chest complaints. History of rheumatoid arthritis, hypertension, coronary artery disease, former smoker-discontinued in 2004. EXAM: CHEST  2 VIEW COMPARISON:  PA and lateral chest x-ray of February 03, 2013 FINDINGS: The lungs are well-expanded. The interstitial markings are chronically increased. There is no alveolar infiltrate. There is no pleural effusion. The heart and pulmonary vascularity are normal. There is calcification in the wall of the aortic arch. The bony thorax exhibits no acute abnormality. There is resorption of the distal aspects of both clavicles consistent with known rheumatoid arthritis. IMPRESSION: 1. Chronic interstitial prominence may reflect the patient's smoking history or may reflect pulmonary involvement by known rheumatoid arthritis. 2.  Aortic atherosclerosis. Electronically Signed   By: David  Martinique M.D.   On: 10/22/2015 16:28   Dg Knee Right Port  Result Date: 10/24/2015 CLINICAL DATA:  Right knee revision, postop chest EXAM: PORTABLE RIGHT KNEE - 1-2 VIEW COMPARISON:  09/18/2015 FINDINGS: Right total knee arthroplasty noted without malalignment. Anterior skin staples present present. No hardware failure nor postoperative fracture. IMPRESSION: Revision of right knee arthroplasty without postoperative fracture. Electronically Signed   By: Ashley Royalty M.D.   On: 10/24/2015 15:39    ASSESSMENT/PLAN:  UTI - start Bactrim DS 1 tab PO BID X 7 days and Florastor 250 mg 1 capsule PO BID X 10 days     Durenda Age, NP [facility] [phone]

## 2015-11-06 ENCOUNTER — Inpatient Hospital Stay (INDEPENDENT_AMBULATORY_CARE_PROVIDER_SITE_OTHER): Payer: Medicare Other | Admitting: Orthopaedic Surgery

## 2015-11-06 DIAGNOSIS — Z96651 Presence of right artificial knee joint: Secondary | ICD-10-CM

## 2015-11-06 DIAGNOSIS — T84012D Broken internal right knee prosthesis, subsequent encounter: Secondary | ICD-10-CM

## 2015-11-13 ENCOUNTER — Ambulatory Visit (INDEPENDENT_AMBULATORY_CARE_PROVIDER_SITE_OTHER): Payer: Medicare Other | Admitting: Orthopaedic Surgery

## 2015-11-13 DIAGNOSIS — T84012D Broken internal right knee prosthesis, subsequent encounter: Secondary | ICD-10-CM

## 2015-11-13 DIAGNOSIS — Z96651 Presence of right artificial knee joint: Secondary | ICD-10-CM

## 2015-11-13 LAB — CBC AND DIFFERENTIAL
HEMATOCRIT: 31 % — AB (ref 36–46)
HEMOGLOBIN: 9.3 g/dL — AB (ref 12.0–16.0)
Platelets: 263 10*3/uL (ref 150–399)
WBC: 6.6 10^3/mL

## 2015-11-16 ENCOUNTER — Encounter: Payer: Self-pay | Admitting: Adult Health

## 2015-11-16 ENCOUNTER — Non-Acute Institutional Stay (SKILLED_NURSING_FACILITY): Payer: Medicare Other | Admitting: Adult Health

## 2015-11-16 DIAGNOSIS — D62 Acute posthemorrhagic anemia: Secondary | ICD-10-CM

## 2015-11-16 DIAGNOSIS — M069 Rheumatoid arthritis, unspecified: Secondary | ICD-10-CM

## 2015-11-16 DIAGNOSIS — E785 Hyperlipidemia, unspecified: Secondary | ICD-10-CM

## 2015-11-16 DIAGNOSIS — R2681 Unsteadiness on feet: Secondary | ICD-10-CM | POA: Diagnosis not present

## 2015-11-16 DIAGNOSIS — I10 Essential (primary) hypertension: Secondary | ICD-10-CM | POA: Diagnosis not present

## 2015-11-16 DIAGNOSIS — Z96651 Presence of right artificial knee joint: Secondary | ICD-10-CM | POA: Diagnosis not present

## 2015-11-16 DIAGNOSIS — E038 Other specified hypothyroidism: Secondary | ICD-10-CM | POA: Diagnosis not present

## 2015-11-16 NOTE — Progress Notes (Signed)
Patient ID: [name], female   DOB: 1945-09-25, 70 y.o.   MRN: [id number]    DATE:  11/16/2015   MRN:  [id number]  BIRTHDAY: [birth year]  Facility:  Nursing Home Location:  [facility] Room Number: 440-N  LEVEL OF CARE:  SNF [phone])  Contact Information    Name Relation Home Work [place] Sister [phone]  [phone]   [facility] Daughter [phone]  [phone]       Code Status History    Date Active Date Inactive Code Status Order ID Comments User Context   10/24/2015  4:29 PM 10/29/2015  7:32 PM Full Code [id number]  Lanae Crumbly, PA-C Inpatient       Chief Complaint  Patient presents with  . Discharge Note    HISTORY OF PRESENT ILLNESS:  This is a 70 year old female who is for discharge home with Home health PT, OT, CNA and Nursing.   She has been admitted to [facility] on 10/29/15 from [facility]  Hospitalization 10/24/15 - 10/29/15.  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  Patient was admitted to this facility for short-term rehabilitation after the patient's recent hospitalization.  Patient has completed SNF rehabilitation and therapy has cleared the patient for discharge.  PAST MEDICAL HISTORY:  Past Medical History:  Diagnosis Date  . Anemia    takes Ferrous Sulfate daily  . Arthritis   . Chronic bronchitis ([place])    Combivent if needed)  . Colostomy care [facility]) 2004  . Coronary artery disease   . Diverticular disease   . Dizziness   . Dry eyes    uses Restasis daily  . H/O hiatal hernia   . History of blood transfusion    no abnormal reaction noted  . History of colon polyps    benign  . Hyperlipidemia    takes Zocor daily  . Hypertension    takes Metoprolol daily  . Hypothyroidism    takes Synthroid daily  . Nocturia    states only d/t being on Lasix  . Numbness and tingling     left foot  . Peripheral edema    takes Lasix daily  . Rheumatoid arthritis(714.0)    Methotrexate 2 times a week  . Shortness of breath    with exertion     CURRENT MEDICATIONS: Reviewed  Patient's Medications  New Prescriptions   No medications on file  Previous Medications   ASPIRIN (BAYER ASPIRIN) 325 MG TABLET    Take 1 tablet (325 mg total) by mouth daily. Take daily for 4 wks   CYCLOSPORINE (RESTASIS) 0.05 % OPHTHALMIC EMULSION    Place 1 drop into both eyes 2 (two) times daily.    ETODOLAC (LODINE) 400 MG TABLET    Take 400 mg by mouth 2 (two) times daily.    FERROUS SULFATE 325 (65 FE) MG TABLET    Take 325 mg by mouth daily.   FOLIC ACID (FOLVITE) 1 MG TABLET    Take 1 mg by mouth daily.     FUROSEMIDE (LASIX) 20 MG TABLET    Take 20 mg by mouth daily.     HYDROCODONE-ACETAMINOPHEN (NORCO) 10-325 MG TABLET    Take 1 tablet by mouth every 4 (four) hours as needed for severe pain.   IPRATROPIUM-ALBUTEROL (COMBIVENT RESPIMAT) 20-100 MCG/ACT AERS RESPIMAT    Inhale 1 puff into the lungs 4 (  four) times daily.    IPRATROPIUM-ALBUTEROL (COMBIVENT) 20-100 MCG/ACT AERS RESPIMAT    Inhale 1 puff into the lungs 2 (two) times daily as needed for wheezing.   LEVOTHYROXINE (SYNTHROID, LEVOTHROID) 50 MCG TABLET    Take 50 mcg by mouth daily.     METHOTREXATE 2.5 MG TABLET    Take 2.5 mg by mouth 2 (two) times a week. Give 4 tablets to = 10 mg on Saturdays and Sundays   METOPROLOL TARTRATE (LOPRESSOR) 25 MG TABLET    Take 25 mg by mouth 2 (two) times daily.    SIMVASTATIN (ZOCOR) 40 MG TABLET    Take 40 mg by mouth daily.   Modified Medications   No medications on file  Discontinued Medications   HYDROCODONE-ACETAMINOPHEN (NORCO/VICODIN) 5-325 MG TABLET    Take 1 tablet by mouth every 4 (four) hours as needed for moderate pain. Until the Norco 10-325 arrives then D/C 5-325   SACCHAROMYCES BOULARDII (FLORASTOR) 250 MG CAPSULE    Take 250 mg by mouth 2 (two) times daily. Take for 10 days    SULFAMETHOXAZOLE-TRIMETHOPRIM (BACTRIM DS,SEPTRA DS) 800-160 MG TABLET    Take 1 tablet by mouth 2 (two) times daily.     No Known Allergies   REVIEW OF SYSTEMS:  GENERAL: no change in appetite, no fatigue, no weight changes, no fever, chills or weakness EYES: Denies change in vision, dry eyes, eye pain, itching or discharge EARS: Denies change in hearing, ringing in ears, or earache NOSE: Denies nasal congestion or epistaxis MOUTH and THROAT: Denies oral discomfort, gingival pain or bleeding, pain from teeth or hoarseness   RESPIRATORY: no cough, SOB, DOE, wheezing, hemoptysis CARDIAC: no chest pain, edema or palpitations GI: no abdominal pain, diarrhea, constipation, heart burn, nausea or vomiting GU: Denies dysuria, frequency, hematuria, incontinence, or discharge PSYCHIATRIC: Denies feeling of depression or anxiety. No report of hallucinations, insomnia, paranoia, or agitation     PHYSICAL EXAMINATION  GENERAL APPEARANCE: Well nourished. In no acute distress. Morbidly obese. SKIN:  Right knee surgical incision is dry, no redness HEAD: Normal in size and contour. No evidence of trauma EYES: Lids open and close normally. No blepharitis, entropion or ectropion. PERRL. Conjunctivae are clear and sclerae are white. Lenses are without opacity EARS: Pinnae are normal. Patient hears normal voice tunes of the examiner MOUTH and THROAT: Lips are without lesions. Oral mucosa is moist and without lesions. Tongue is normal in shape, size, and color and without lesions NECK: supple, trachea midline, no neck masses, no thyroid tenderness, no thyromegaly LYMPHATICS: no LAN in the neck, no supraclavicular LAN RESPIRATORY: breathing is even & unlabored, BS CTAB CARDIAC: RRR, no murmur,no extra heart sounds, no edema GI: abdomen soft, normal BS, no masses, no tenderness, no hepatomegaly, no splenomegaly, +colostomy EXTREMITIES:  Able to move X 4 extremities PSYCHIATRIC: Alert and oriented X 3.  Affect and behavior are appropriate  LABS/RADIOLOGY: Labs reviewed: Basic Metabolic Panel:  Recent Labs  05/04/15 1433 10/22/15 1447 10/25/15 0403 10/31/15  NA 146* 143 138 146  K 3.9 4.5 4.1 4.2  CL 111 112* 104  --   CO2 _0 --   GLUCOSE 92 91 97  --   BUN 21* 19 17 23*  CREATININE 1.07* 1.14* 1.33* 1.2*  CALCIUM 9.9 10.1 9.2  --    Liver Function Tests:  Recent Labs  05/04/15 1433 10/22/15 1447  AST 16 19  ALT 9* 10*  ALKPHOS 52 62  BILITOT 0.6 0.7  PROT 6.8 7.8  ALBUMIN 3.6 3.3*   CBC:  Recent Labs  05/04/15 1433 10/22/15 1447 10/25/15 0403 10/26/15 0621 10/27/15 0451 10/31/15 11/13/15  WBC 5.6 6.1 6.8 8.3 7.4 6.4 6.6  NEUTROABS 2.5 2.9  --   --   --   --   --   HGB 9.9* 10.4* 8.9* 8.8* 8.6* 8.7* 9.3*  HCT 33.3* 35.8* 31.2* 30.2* 29.4* 29* 31*  MCV 94.3 91.6 92.3 90.7 90.2  --   --   PLT 297 304 257 248 238 275 263     Dg Chest 2 View  Result Date: 10/22/2015 CLINICAL DATA:  Preoperative examination prior to knee replacement surgery. No current chest complaints. History of rheumatoid arthritis, hypertension, coronary artery disease, former smoker-discontinued in 2004. EXAM: CHEST  2 VIEW COMPARISON:  PA and lateral chest x-ray of February 03, 2013 FINDINGS: The lungs are well-expanded. The interstitial markings are chronically increased. There is no alveolar infiltrate. There is no pleural effusion. The heart and pulmonary vascularity are normal. There is calcification in the wall of the aortic arch. The bony thorax exhibits no acute abnormality. There is resorption of the distal aspects of both clavicles consistent with known rheumatoid arthritis. IMPRESSION: 1. Chronic interstitial prominence may reflect the patient's smoking history or may reflect pulmonary involvement by known rheumatoid arthritis. 2. Aortic atherosclerosis. Electronically Signed   By: David  Martinique M.D.   On: 10/22/2015 16:28   Dg Knee Right Port  Result Date: 10/24/2015 CLINICAL  DATA:  Right knee revision, postop chest EXAM: PORTABLE RIGHT KNEE - 1-2 VIEW COMPARISON:  09/18/2015 FINDINGS: Right total knee arthroplasty noted without malalignment. Anterior skin staples present present. No hardware failure nor postoperative fracture. IMPRESSION: Revision of right knee arthroplasty without postoperative fracture. Electronically Signed   By: Ashley Royalty M.D.   On: 10/24/2015 15:39    ASSESSMENT/PLAN:  Unsteady gait - for Home health PT, OT, CNA and Nursing, for therapeutic strengthening exercises; fall precaution  Right knee failed arthroplasty S/P revision surgery - for Home health PT, OT, CNA and Nursing, for therapeutic strengthening exercises; continue Norco 10/325 mg 1 tab PO Q 4 hours PRN and Lodine 400 mg 1 tab PO BID for pain; ASA 325 mg 1 tab PO Q D till 11/25/15 for DVT prophylaxis  Hypertension - well-controlled; continue Lopressor 50 mg 1/2 tab= 25 mg PO BID and Lasix 20 mg 1 tab PO Q D  RA - continue Lodine 400 mg 1 tab PO BID and Methotrexate 2.5 mg give 4 tabs = 10 mg PO Q Saturdays and Sundays  Anemia, acute blood loss - continue FeSO4 325 mg 1 tab PO Q D Lab Results  Component Value Date   HGB 9.3 (A) 11/13/2015   Hyperlipidemia - continue Simvastatin 40 mg 1 tab PO Q D  Hypothyroidism - continue Levothyroxine 50 mcg 1 tab PO Q D       I have filled out patient's discharge paperwork and written prescriptions.  Patient will receive home health PT, OT, Nursing and CNA.  DME provided:  None  Total discharge time: Less than 30 minutes  Discharge time involved coordination of the discharge process with social worker, nursing staff and therapy department. Medical justification for home health services verified.     Durenda Age, NP [employer] [phone]

## 2015-11-19 DIAGNOSIS — T84012D Broken internal right knee prosthesis, subsequent encounter: Secondary | ICD-10-CM

## 2015-11-19 DIAGNOSIS — J42 Unspecified chronic bronchitis: Secondary | ICD-10-CM

## 2015-11-19 DIAGNOSIS — I2510 Atherosclerotic heart disease of native coronary artery without angina pectoris: Secondary | ICD-10-CM

## 2015-11-19 DIAGNOSIS — Z89521 Acquired absence of right knee: Secondary | ICD-10-CM | POA: Diagnosis not present

## 2015-11-23 NOTE — Progress Notes (Signed)
Late entry for G codes:   10/25/15 1138  PT G-Codes **NOT FOR INPATIENT CLASS**  Functional Assessment Tool Used clinical judgement and objective findings  Functional Limitation Changing and maintaining body position  Changing and Maintaining Body Position Current Status (J0856) CJ  Changing and Maintaining Body Position Goal Status (D4370) CI   Santiago Glad L. Tamala Julian, [place] Pager [phone] 11/23/2015

## 2015-11-23 NOTE — Progress Notes (Signed)
OT evaluation addendum, late entry for 10/25/15.   10/25/15 1200  OT Time Calculation  OT Start Time (ACUTE ONLY) 1052  OT Stop Time (ACUTE ONLY) 1126  OT Time Calculation (min) 34 min  OT G-codes **NOT FOR INPATIENT CLASS**  Functional Assessment Tool Used Clinical judgement  Functional Limitation Self care  Self Care Current Status (R5188) CK  Self Care Goal Status (C1660) CK  Self Care Discharge Status (Y3016) CK  OT General Charges  $OT Visit 1 Procedure  OT Evaluation  $OT Eval Moderate Complexity 1 Procedure  OT Treatments  $Self Care/Home Management  8-22 mins   [place], OTR/L [phone]

## 2015-11-30 ENCOUNTER — Other Ambulatory Visit: Payer: Self-pay | Admitting: Nurse Practitioner

## 2015-12-11 ENCOUNTER — Ambulatory Visit (INDEPENDENT_AMBULATORY_CARE_PROVIDER_SITE_OTHER): Payer: Medicare Other | Admitting: Orthopaedic Surgery

## 2015-12-11 ENCOUNTER — Encounter (INDEPENDENT_AMBULATORY_CARE_PROVIDER_SITE_OTHER): Payer: Self-pay | Admitting: Orthopaedic Surgery

## 2015-12-11 VITALS — Ht 62.0 in | Wt 289.0 lb

## 2015-12-11 DIAGNOSIS — Z96651 Presence of right artificial knee joint: Secondary | ICD-10-CM

## 2015-12-11 NOTE — Progress Notes (Signed)
Office Visit Note   Patient: [name]           Date of Birth: 1945-05-12           MRN: [id number] Visit Date: 12/11/2015              Requested by: Dixie Dials, MD [address] [place], [place] [address] PCP: Birdie Riddle, MD   Assessment & Plan: Visit Diagnoses:  1. S/P revision of total knee, right     Plan: We'll continue with leg strengthening. Continues walker and her legs are stronger than she work her way to a cane.  Patient had a broken polyethylene liner that had to be exchanged. She'll continue to work on the walking longer distances and work on weight loss.  Follow-Up Instructions: Return in about 6 months (around 06/09/2016).   Orders:  No orders of the defined types were placed in this encounter.  No orders of the defined types were placed in this encounter.     Procedures: No procedures performed   Clinical Data: No additional findings.   Subjective: Chief Complaint  Patient presents with  . Right Knee - Follow-up, Routine Post Op    Patient returns for four week recheck. She is status post right TKA revision on 10/24/2015.  She is 6 weeks and 6 days post op. She states that the weather is really bothering her knee. She states the pain is a lot better since the surgery.  She ambulates with a walker.    Review of Systems unchanged from surgery 2 months ago other than above   Objective: Vital Signs: Ht _0  (1.575 m)   Wt 289 lb (131.1 kg)   BMI 52.86 kg/m   Physical Exam patient's alert oriented. Knee incisions well healed. Quad strength is good. No pitting edema negative [place].  Ortho Exam well-healed bilateral total knee arthroplasty incisions. Previous x-ray was reviewed with patient which showed good position and alignment.  Specialty Comments:  No specialty comments available.  Imaging: No results found.   PMFS History: Patient Active Problem List   Diagnosis Date Noted  . S/P revision of total knee, right  12/11/2015  . S/P revision of total knee 10/24/2015   Past Medical History:  Diagnosis Date  . Anemia    takes Ferrous Sulfate daily  . Arthritis   . Chronic bronchitis ([place])    Combivent if needed)  . Colostomy care [facility]) 2004  . Coronary artery disease   . Diverticular disease   . Dizziness   . Dry eyes    uses Restasis daily  . H/O hiatal hernia   . History of blood transfusion    no abnormal reaction noted  . History of colon polyps    benign  . Hyperlipidemia    takes Zocor daily  . Hypertension    takes Metoprolol daily  . Hypothyroidism    takes Synthroid daily  . Nocturia    states only d/t being on Lasix  . Numbness and tingling    left foot  . Peripheral edema    takes Lasix daily  . Rheumatoid arthritis(714.0)    Methotrexate 2 times a week  . Shortness of breath    with exertion    Family History  Problem Relation Age of Onset  . Anesthesia problems Neg Hx   . Hypotension Neg Hx   . Malignant hyperthermia Neg Hx   . Pseudochol deficiency Neg Hx     Past Surgical History:  Procedure Laterality Date  . ABDOMINAL HYSTERECTOMY  1965  . CAPSULOTOMY Bilateral 06/01/2012   Procedure: MINOR CAPSULOTOMY;  Surgeon: Myrtha Mantis., MD;  Location: [place];  Service: Ophthalmology;  Laterality: Bilateral;  . CATARACT EXTRACTION W/PHACO  01/01/2011   Procedure: CATARACT EXTRACTION PHACO AND INTRAOCULAR LENS PLACEMENT (IOC);  Surgeon: [name], MD;  Location: [place];  Service: Ophthalmology;  Laterality: Left;  . CATARACT EXTRACTION W/PHACO  12/24/2011   Procedure: CATARACT EXTRACTION PHACO AND INTRAOCULAR LENS PLACEMENT (IOC);  Surgeon: [name], MD;  Location: [place];  Service: Ophthalmology;  Laterality: Right;  . COLON SURGERY  2002   d/t diverticulosis  . COLONOSCOPY    . COLOSTOMY    . ESOPHAGOGASTRODUODENOSCOPY    . EYE SURGERY    . KNEE ARTHROSCOPY     bilateral  . TONSILLECTOMY    . TOTAL KNEE ARTHROPLASTY  2005/2008   bilateral  .  TOTAL KNEE REVISION Right 10/24/2015   Procedure: TOTAL KNEE REVISION;  Surgeon: [name], MD;  Location: [place];  Service: Orthopedics;  Laterality: Right;  . YAG LASER APPLICATION Bilateral 03/06/4130   Procedure: YAG LASER APPLICATION;  Surgeon: Myrtha Mantis., MD;  Location: [place];  Service: Ophthalmology;  Laterality: Bilateral;   Social History   Occupational History  . Not on file.   Social History Main Topics  . Smoking status: Former Smoker    Quit date: 10/22/2002  . Smokeless tobacco: Never Used     Comment: quit smoking in 2004  . Alcohol use No     Comment: quit drinking in 2004  . Drug use: No  . Sexual activity: No

## 2015-12-30 ENCOUNTER — Other Ambulatory Visit: Payer: Self-pay | Admitting: Nurse Practitioner

## 2016-01-29 ENCOUNTER — Other Ambulatory Visit: Payer: Self-pay | Admitting: Nurse Practitioner

## 2016-02-28 ENCOUNTER — Other Ambulatory Visit: Payer: Self-pay | Admitting: Nurse Practitioner

## 2016-03-04 ENCOUNTER — Other Ambulatory Visit: Payer: Self-pay | Admitting: Nurse Practitioner

## 2016-03-26 ENCOUNTER — Encounter (INDEPENDENT_AMBULATORY_CARE_PROVIDER_SITE_OTHER): Payer: Self-pay | Admitting: Orthopaedic Surgery

## 2016-03-26 ENCOUNTER — Ambulatory Visit (INDEPENDENT_AMBULATORY_CARE_PROVIDER_SITE_OTHER): Payer: Medicare Other | Admitting: Orthopaedic Surgery

## 2016-03-26 VITALS — Ht 62.0 in | Wt 289.0 lb

## 2016-03-26 DIAGNOSIS — Z96651 Presence of right artificial knee joint: Secondary | ICD-10-CM

## 2016-03-26 NOTE — Progress Notes (Signed)
Office Visit Note   Patient: [name]           Date of Birth: [birth year]           MRN: [id number] Visit Date: 03/26/2016              Requested by: Dixie Dials, MD [address] [place], [place] [address] PCP: Birdie Riddle, MD   Assessment & Plan: Visit Diagnoses:  1. Status post revision of total replacement of right knee     Plan: Patient is doing very pleased with her surgical result. Follow-up with [place] in 5 months for recheck.  Follow-Up Instructions: Return in about 5 months (around 08/23/2016).   Orders:  No orders of the defined types were placed in this encounter.  No orders of the defined types were placed in this encounter.     Procedures: No procedures performed   Clinical Data: No additional findings.   Subjective: Chief Complaint  Patient presents with  . Right Knee - Follow-up    Right Total Knee Revision 10/24/15 ~5 months post op    Patient is a 71 y.o female who presents today for follow up right total knee revision. She is approximately 5 months post op. She is doing well overall, and pleased with her progress. She complains of minimal pain on occasion.   Patient comes in for an earlier appointment than scheduled due to a mix up. Patient thought that she was going to see a rheumatologist with our group.  States that her right knee is doing very well and she is extremely pleased with her result.  Review of Systems  Respiratory: Negative.   Cardiovascular: Negative.   Neurological: Negative.   Psychiatric/Behavioral: Negative.      Objective: Vital Signs: Ht _0  (1.575 m)   Wt 289 lb (131.1 kg)   BMI 52.86 kg/m   Physical Exam  Constitutional: No distress.  HENT:  Head: Normocephalic and atraumatic.  Eyes: EOM are normal. Pupils are equal, round, and reactive to light.  Musculoskeletal:  Right knee range of motion about 0-110+ degrees. Knee nontender.  Skin: Skin is warm and dry.  Psychiatric: She has a normal mood and  affect.    Ortho Exam  Specialty Comments:  No specialty comments available.  Imaging: No results found.   PMFS History: Patient Active Problem List   Diagnosis Date Noted  . 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 S/P revision of total knee 10/24/2015   Past Medical History:  Diagnosis Date  . Anemia    takes Ferrous Sulfate daily  . Arthritis   . Chronic bronchitis ([place])    Combivent if needed)  . Colostomy care [facility]) 2004  . Coronary artery disease   . Diverticular disease   . Dizziness   . Dry eyes    uses Restasis daily  . H/O hiatal hernia   . History of blood transfusion    no abnormal reaction noted  . History of colon polyps    benign  . Hyperlipidemia    takes Zocor daily  . Hypertension    takes Metoprolol daily  . Hypothyroidism    takes Synthroid daily  . Nocturia    states only d/t being on Lasix  . Numbness and tingling    left foot  . Peripheral edema    takes Lasix daily  . Rheumatoid arthritis(714.0)    Methotrexate 2 times a week  . Shortness of breath    with  exertion    Family History  Problem Relation Age of Onset  . Anesthesia problems Neg Hx   . Hypotension Neg Hx   . Malignant hyperthermia Neg Hx   . Pseudochol deficiency Neg Hx     Past Surgical History:  Procedure Laterality Date  . ABDOMINAL HYSTERECTOMY  1965  . CAPSULOTOMY Bilateral 06/01/2012   Procedure: MINOR CAPSULOTOMY;  Surgeon: Myrtha Mantis., MD;  Location: [facility];  Service: Ophthalmology;  Laterality: Bilateral;  . CATARACT EXTRACTION W/PHACO  01/01/2011   Procedure: CATARACT EXTRACTION PHACO AND INTRAOCULAR LENS PLACEMENT (IOC);  Surgeon: [name], MD;  Location: [facility];  Service: Ophthalmology;  Laterality: Left;  . CATARACT EXTRACTION W/PHACO  12/24/2011   Procedure: CATARACT EXTRACTION PHACO AND INTRAOCULAR LENS PLACEMENT (IOC);  Surgeon: [name], MD;  Location: [facility];  Service: Ophthalmology;  Laterality: Right;  . COLON SURGERY  2002    d/t diverticulosis  . COLONOSCOPY    . COLOSTOMY    . ESOPHAGOGASTRODUODENOSCOPY    . EYE SURGERY    . KNEE ARTHROSCOPY     bilateral  . TONSILLECTOMY    . TOTAL KNEE ARTHROPLASTY  2005/2008   bilateral  . TOTAL KNEE REVISION Right 10/24/2015   Procedure: TOTAL KNEE REVISION;  Surgeon: [name], MD;  Location: [facility];  Service: Orthopedics;  Laterality: Right;  . YAG LASER APPLICATION Bilateral 07/05/6293   Procedure: YAG LASER APPLICATION;  Surgeon: Myrtha Mantis., MD;  Location: [facility];  Service: Ophthalmology;  Laterality: Bilateral;   Social History   Occupational History  . Not on file.   Social History Main Topics  . Smoking status: Former Smoker    Quit date: 10/22/2002  . Smokeless tobacco: Never Used     Comment: quit smoking in 2004  . Alcohol use No     Comment: quit drinking in 2004  . Drug use: No  . Sexual activity: No

## 2016-03-29 ENCOUNTER — Other Ambulatory Visit: Payer: Self-pay | Admitting: Nurse Practitioner

## 2016-04-01 ENCOUNTER — Other Ambulatory Visit: Payer: Self-pay | Admitting: Nurse Practitioner

## 2016-04-28 ENCOUNTER — Other Ambulatory Visit: Payer: Self-pay | Admitting: Nurse Practitioner

## 2016-05-05 ENCOUNTER — Other Ambulatory Visit: Payer: Self-pay | Admitting: Nurse Practitioner

## 2016-05-28 ENCOUNTER — Other Ambulatory Visit: Payer: Self-pay | Admitting: Nurse Practitioner

## 2016-06-10 ENCOUNTER — Ambulatory Visit (INDEPENDENT_AMBULATORY_CARE_PROVIDER_SITE_OTHER): Payer: Medicare Other | Admitting: Orthopaedic Surgery

## 2016-06-27 ENCOUNTER — Other Ambulatory Visit: Payer: Self-pay | Admitting: Nurse Practitioner

## 2016-07-27 ENCOUNTER — Other Ambulatory Visit: Payer: Self-pay | Admitting: Nurse Practitioner

## 2016-08-26 ENCOUNTER — Encounter (INDEPENDENT_AMBULATORY_CARE_PROVIDER_SITE_OTHER): Payer: Self-pay | Admitting: Orthopaedic Surgery

## 2016-08-26 ENCOUNTER — Ambulatory Visit (INDEPENDENT_AMBULATORY_CARE_PROVIDER_SITE_OTHER): Payer: Medicare Other

## 2016-08-26 ENCOUNTER — Other Ambulatory Visit: Payer: Self-pay | Admitting: Nurse Practitioner

## 2016-08-26 ENCOUNTER — Ambulatory Visit (INDEPENDENT_AMBULATORY_CARE_PROVIDER_SITE_OTHER): Payer: Medicare Other | Admitting: Orthopaedic Surgery

## 2016-08-26 VITALS — Ht 63.0 in | Wt 277.0 lb

## 2016-08-26 DIAGNOSIS — M25561 Pain in right knee: Secondary | ICD-10-CM

## 2016-08-26 DIAGNOSIS — Z96651 Presence of right artificial knee joint: Secondary | ICD-10-CM

## 2016-08-26 NOTE — Progress Notes (Signed)
Office Visit Note   Patient: [name]           Date of Birth: [birth year]           MRN: [id number] Visit Date: 08/26/2016              Requested by: Dixie Dials, MD [address] [place],  [address] PCP: Dixie Dials, MD   Assessment & Plan: Visit Diagnoses:  1. S/P revision of total knee, right     Plan: I plan to recheck her in one year. She needs to work on weight loss due to her increased BMI at 49 and to help her improvement in overall health with the blood pressure, decreased back and knee pain . We discussed the decreased caloric food intake. Leg strengthening to help strengthen her quad help prevent some occasional buckling in her knee. Return 1 year standing AP x-ray both knees no lateral needed on return  Follow-Up Instructions: Return in about 1 year (around 08/26/2017).   Orders:  Orders Placed This Encounter  Procedures  . XR Knee 1-2 Views Right   No orders of the defined types were placed in this encounter.     Procedures: No procedures performed   Clinical Data: No additional findings.   Subjective: Chief Complaint  Patient presents with  . Right Knee - Follow-up    HPI 71 year old female returns post right total knee revision with poly-exchange for broken Polly. The surgery date was on 10/24/2015. She is walking better her BMI is 49 and she states when she walks a lot sometimes her knee feels like it will buckle. She still taking Norco 10/325. She states her medical doctor and cardiologist are telling her she needs to lose weight and she is trying to work on it.  Review of Systems review of systems updated and is unchanged from 12/11/2015 other than as mentioned above.   Objective: Vital Signs: Ht _0  (1.6 m)   Wt 277 lb (125.6 kg)   BMI 49.07 kg/m   Physical Exam  Constitutional: She is oriented to person, place, and time. She appears well-developed.  HENT:  Head: Normocephalic.  Right Ear: External ear normal.  Left  Ear: External ear normal.  Eyes: Pupils are equal, round, and reactive to light.  Neck: No tracheal deviation present. No thyromegaly present.  Cardiovascular: Normal rate.   Pulmonary/Chest: Effort normal.  Abdominal: Soft.  Musculoskeletal:  Well-healed midline total knee arthroplasty incision the right and left. She takes good quadriceps resistance with hand testing. Range of motion of her ankles are normal. No calf tenderness. Collateral ligaments are balanced.  Neurological: She is alert and oriented to person, place, and time.  Skin: Skin is warm and dry.  Psychiatric: She has a normal mood and affect. Her behavior is normal.    Ortho Exam  Specialty Comments:  No specialty comments available.  Imaging: No results found.   PMFS History: Patient Active Problem List   Diagnosis Date Noted  . 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 S/P revision of total knee 10/24/2015   Past Medical History:  Diagnosis Date  . Anemia    takes Ferrous Sulfate daily  . Arthritis   . Chronic bronchitis ([place])    Combivent if needed)  . Colostomy care [facility]) 2004  . Coronary artery disease   . Diverticular disease   . Dizziness   . Dry eyes    uses Restasis daily  . H/O hiatal hernia   .  History of blood transfusion    no abnormal reaction noted  . History of colon polyps    benign  . Hyperlipidemia    takes Zocor daily  . Hypertension    takes Metoprolol daily  . Hypothyroidism    takes Synthroid daily  . Nocturia    states only d/t being on Lasix  . Numbness and tingling    left foot  . Peripheral edema    takes Lasix daily  . Rheumatoid arthritis(714.0)    Methotrexate 2 times a week  . Shortness of breath    with exertion    Family History  Problem Relation Age of Onset  . Anesthesia problems Neg Hx   . Hypotension Neg Hx   . Malignant hyperthermia Neg Hx   . Pseudochol deficiency Neg Hx     Past Surgical History:  Procedure Laterality Date  .  ABDOMINAL HYSTERECTOMY  1965  . CAPSULOTOMY Bilateral 06/01/2012   Procedure: MINOR CAPSULOTOMY;  Surgeon: Myrtha Mantis., MD;  Location: [facility];  Service: Ophthalmology;  Laterality: Bilateral;  . CATARACT EXTRACTION W/PHACO  01/01/2011   Procedure: CATARACT EXTRACTION PHACO AND INTRAOCULAR LENS PLACEMENT (IOC);  Surgeon: [name], MD;  Location: [facility];  Service: Ophthalmology;  Laterality: Left;  . CATARACT EXTRACTION W/PHACO  12/24/2011   Procedure: CATARACT EXTRACTION PHACO AND INTRAOCULAR LENS PLACEMENT (IOC);  Surgeon: [name], MD;  Location: [facility];  Service: Ophthalmology;  Laterality: Right;  . COLON SURGERY  2002   d/t diverticulosis  . COLONOSCOPY    . COLOSTOMY    . ESOPHAGOGASTRODUODENOSCOPY    . EYE SURGERY    . KNEE ARTHROSCOPY     bilateral  . TONSILLECTOMY    . TOTAL KNEE ARTHROPLASTY  2005/2008   bilateral  . TOTAL KNEE REVISION Right 10/24/2015   Procedure: TOTAL KNEE REVISION;  Surgeon: [name], MD;  Location: [facility];  Service: Orthopedics;  Laterality: Right;  . YAG LASER APPLICATION Bilateral 09/03/3372   Procedure: YAG LASER APPLICATION;  Surgeon: Myrtha Mantis., MD;  Location: [facility];  Service: Ophthalmology;  Laterality: Bilateral;   Social History   Occupational History  . Not on file.   Social History Main Topics  . Smoking status: Former Smoker    Quit date: 10/22/2002  . Smokeless tobacco: Never Used     Comment: quit smoking in 2004  . Alcohol use No     Comment: quit drinking in 2004  . Drug use: No  . Sexual activity: No

## 2016-08-27 ENCOUNTER — Other Ambulatory Visit: Payer: Self-pay | Admitting: Nurse Practitioner

## 2016-09-25 ENCOUNTER — Other Ambulatory Visit: Payer: Self-pay | Admitting: Nurse Practitioner

## 2016-10-25 ENCOUNTER — Other Ambulatory Visit: Payer: Self-pay | Admitting: Nurse Practitioner

## 2017-04-13 ENCOUNTER — Ambulatory Visit (INDEPENDENT_AMBULATORY_CARE_PROVIDER_SITE_OTHER): Payer: Medicare Other | Admitting: Podiatry

## 2017-04-13 DIAGNOSIS — M79675 Pain in left toe(s): Secondary | ICD-10-CM | POA: Diagnosis not present

## 2017-04-13 DIAGNOSIS — B351 Tinea unguium: Secondary | ICD-10-CM

## 2017-04-13 DIAGNOSIS — M79674 Pain in right toe(s): Secondary | ICD-10-CM

## 2017-04-16 NOTE — Progress Notes (Signed)
Subjective:   Patient ID: [name], female   DOB: 72 y.o.   MRN: [id number]   HPI 72 year old female presents the office today for concerns of thick, painful, elongated toenails that she cannot trim herself.  They are painful with pressure in shoes.  She denies any redness or drainage or any swelling to the toenail sites.  She has no other concerns today.   Review of Systems  All other systems reviewed and are negative.  Past Medical History:  Diagnosis Date  . Anemia    takes Ferrous Sulfate daily  . Arthritis   . Chronic bronchitis ([place])    Combivent if needed)  . Colostomy care [facility]) 2004  . Coronary artery disease   . Diverticular disease   . Dizziness   . Dry eyes    uses Restasis daily  . H/O hiatal hernia   . History of blood transfusion    no abnormal reaction noted  . History of colon polyps    benign  . Hyperlipidemia    takes Zocor daily  . Hypertension    takes Metoprolol daily  . Hypothyroidism    takes Synthroid daily  . Nocturia    states only d/t being on Lasix  . Numbness and tingling    left foot  . Peripheral edema    takes Lasix daily  . Rheumatoid arthritis(714.0)    Methotrexate 2 times a week  . Shortness of breath    with exertion    Past Surgical History:  Procedure Laterality Date  . ABDOMINAL HYSTERECTOMY  1965  . CAPSULOTOMY Bilateral 06/01/2012   Procedure: MINOR CAPSULOTOMY;  Surgeon: Myrtha Mantis., MD;  Location: [facility];  Service: Ophthalmology;  Laterality: Bilateral;  . CATARACT EXTRACTION W/PHACO  01/01/2011   Procedure: CATARACT EXTRACTION PHACO AND INTRAOCULAR LENS PLACEMENT (IOC);  Surgeon: [name], MD;  Location: [facility];  Service: Ophthalmology;  Laterality: Left;  . CATARACT EXTRACTION W/PHACO  12/24/2011   Procedure: CATARACT EXTRACTION PHACO AND INTRAOCULAR LENS PLACEMENT (IOC);  Surgeon: [name], MD;  Location: [facility];  Service: Ophthalmology;  Laterality: Right;  . COLON SURGERY  2002   d/t  diverticulosis  . COLONOSCOPY    . COLOSTOMY    . ESOPHAGOGASTRODUODENOSCOPY    . EYE SURGERY    . KNEE ARTHROSCOPY     bilateral  . TONSILLECTOMY    . TOTAL KNEE ARTHROPLASTY  2005/2008   bilateral  . TOTAL KNEE REVISION Right 10/24/2015   Procedure: TOTAL KNEE REVISION;  Surgeon: [name], MD;  Location: [facility];  Service: Orthopedics;  Laterality: Right;  . YAG LASER APPLICATION Bilateral 03/31/7423   Procedure: YAG LASER APPLICATION;  Surgeon: Myrtha Mantis., MD;  Location: [facility];  Service: Ophthalmology;  Laterality: Bilateral;     Current Outpatient Medications:  .  aspirin (BAYER ASPIRIN) 325 MG tablet, Take 1 tablet (325 mg total) by mouth daily. Take daily for 4 wks, Disp: , Rfl:  .  cycloSPORINE (RESTASIS) 0.05 % ophthalmic emulsion, Place 1 drop into both eyes 2 (two) times daily. , Disp: , Rfl:  .  etodolac (LODINE) 400 MG tablet, Take 400 mg by mouth 2 (two) times daily. , Disp: , Rfl:  .  ferrous sulfate 325 (65 FE) MG tablet, Take 325 mg by mouth daily., Disp: , Rfl:  .  folic acid (FOLVITE) 1 MG tablet, Take 1 mg by mouth daily.  , Disp: , Rfl:  .  furosemide (LASIX) 20  MG tablet, Take 20 mg by mouth daily.  , Disp: , Rfl:  .  HYDROcodone-acetaminophen (NORCO) 10-325 MG tablet, Take 1 tablet by mouth every 4 (four) hours as needed for severe pain., Disp: , Rfl:  .  Ipratropium-Albuterol (COMBIVENT RESPIMAT) 20-100 MCG/ACT AERS respimat, Inhale 1 puff into the lungs 4 (four) times daily. , Disp: , Rfl:  .  Ipratropium-Albuterol (COMBIVENT) 20-100 MCG/ACT AERS respimat, Inhale 1 puff into the lungs 2 (two) times daily as needed for wheezing., Disp: , Rfl:  .  levothyroxine (SYNTHROID, LEVOTHROID) 50 MCG tablet, Take 50 mcg by mouth daily.  , Disp: , Rfl:  .  methotrexate 2.5 MG tablet, Take 2.5 mg by mouth 2 (two) times a week. Give 4 tablets to = 10 mg on Saturdays and Sundays, Disp: , Rfl:  .  metoprolol tartrate (LOPRESSOR) 25 MG tablet, Take 25 mg by mouth 2  (two) times daily. , Disp: , Rfl:  .  simvastatin (ZOCOR) 40 MG tablet, Take 40 mg by mouth daily. , Disp: , Rfl:   No Known Allergies  Social History   Socioeconomic History  . Marital status: Single    Spouse name: Not on file  . Number of children: Not on file  . Years of education: Not on file  . Highest education level: Not on file  Occupational History  . Not on file  Social Needs  . Financial resource strain: Not on file  . Food insecurity:    Worry: Not on file    Inability: Not on file  . Transportation needs:    Medical: Not on file    Non-medical: Not on file  Tobacco Use  . Smoking status: Former Smoker    Last attempt to quit: 10/22/2002    Years since quitting: 14.4  . Smokeless tobacco: Never Used  . Tobacco comment: quit smoking in 2004  Substance and Sexual Activity  . Alcohol use: No    Comment: quit drinking in 2004  . Drug use: No  . Sexual activity: Never    Birth control/protection: Surgical  Lifestyle  . Physical activity:    Days per week: Not on file    Minutes per session: Not on file  . Stress: Not on file  Relationships  . Social connections:    Talks on phone: Not on file    Gets together: Not on file    Attends religious service: Not on file    Active member of club or organization: Not on file    Attends meetings of clubs or organizations: Not on file    Relationship status: Not on file  . Intimate partner violence:    Fear of current or ex partner: Not on file    Emotionally abused: Not on file    Physically abused: Not on file    Forced sexual activity: Not on file  Other Topics Concern  . Not on file  Social History Narrative  . Not on file         Objective:  Physical Exam  General: AAO x3, NAD  Dermatological: Nails are hypertrophic, dystrophic, brittle, discolored, elongated 10. No surrounding redness or drainage. Tenderness nails 1-5 bilaterally. No open lesions or pre-ulcerative lesions are identified  today.  Vascular: Dorsalis Pedis artery and Posterior Tibial artery pedal pulses are 2/4 bilateral with immedate capillary fill time. There is no pain with calf compression, swelling, warmth, erythema.   Neruologic: Grossly intact via light touch bilateral. Protective threshold with Semmes Wienstein monofilament  intact to all pedal sites bilateral.   Musculoskeletal: No gross boney pedal deformities bilateral. No pain, crepitus, or limitation noted with foot and ankle range of motion bilateral. Muscular strength 5/5 in all groups tested bilateral.     Assessment:   Symptomatic onychomycosis     Plan:  -Treatment options discussed including all alternatives, risks, and complications -Etiology of symptoms were discussed -Nails debrided 10 without complications or bleeding. -Daily foot inspection -Follow-up in 3 months or sooner if any problems arise. In the meantime, encouraged to call the office with any questions, concerns, change in symptoms.   Celesta Gentile, DPM

## 2017-04-22 ENCOUNTER — Other Ambulatory Visit: Payer: Self-pay | Admitting: Nurse Practitioner

## 2017-04-22 DIAGNOSIS — Z1231 Encounter for screening mammogram for malignant neoplasm of breast: Secondary | ICD-10-CM

## 2017-05-13 ENCOUNTER — Ambulatory Visit: Payer: Medicare Other

## 2017-06-18 ENCOUNTER — Encounter (HOSPITAL_COMMUNITY): Payer: Self-pay | Admitting: Emergency Medicine

## 2017-06-18 ENCOUNTER — Emergency Department (HOSPITAL_COMMUNITY): Payer: Medicare Other

## 2017-06-18 ENCOUNTER — Inpatient Hospital Stay (HOSPITAL_COMMUNITY)
Admission: EM | Admit: 2017-06-18 | Discharge: 2017-07-02 | DRG: 391 | Disposition: A | Discharge status: Home Health | Payer: Medicare Other | Attending: Family Medicine | Admitting: Family Medicine

## 2017-06-18 DIAGNOSIS — K5732 Diverticulitis of large intestine without perforation or abscess without bleeding: Secondary | ICD-10-CM | POA: Diagnosis present

## 2017-06-18 DIAGNOSIS — Z79890 Hormone replacement therapy (postmenopausal): Secondary | ICD-10-CM

## 2017-06-18 DIAGNOSIS — M109 Gout, unspecified: Secondary | ICD-10-CM | POA: Diagnosis present

## 2017-06-18 DIAGNOSIS — I10 Essential (primary) hypertension: Secondary | ICD-10-CM | POA: Diagnosis present

## 2017-06-18 DIAGNOSIS — K1231 Oral mucositis (ulcerative) due to antineoplastic therapy: Secondary | ICD-10-CM | POA: Diagnosis present

## 2017-06-18 DIAGNOSIS — R509 Fever, unspecified: Secondary | ICD-10-CM

## 2017-06-18 DIAGNOSIS — I2729 Other secondary pulmonary hypertension: Secondary | ICD-10-CM | POA: Diagnosis not present

## 2017-06-18 DIAGNOSIS — Z7982 Long term (current) use of aspirin: Secondary | ICD-10-CM

## 2017-06-18 DIAGNOSIS — L03116 Cellulitis of left lower limb: Secondary | ICD-10-CM

## 2017-06-18 DIAGNOSIS — T451X5A Adverse effect of antineoplastic and immunosuppressive drugs, initial encounter: Secondary | ICD-10-CM | POA: Diagnosis present

## 2017-06-18 DIAGNOSIS — G8929 Other chronic pain: Secondary | ICD-10-CM | POA: Diagnosis present

## 2017-06-18 DIAGNOSIS — M069 Rheumatoid arthritis, unspecified: Secondary | ICD-10-CM | POA: Diagnosis present

## 2017-06-18 DIAGNOSIS — L03115 Cellulitis of right lower limb: Secondary | ICD-10-CM | POA: Diagnosis present

## 2017-06-18 DIAGNOSIS — Z79899 Other long term (current) drug therapy: Secondary | ICD-10-CM

## 2017-06-18 DIAGNOSIS — E785 Hyperlipidemia, unspecified: Secondary | ICD-10-CM | POA: Diagnosis present

## 2017-06-18 DIAGNOSIS — K121 Other forms of stomatitis: Secondary | ICD-10-CM | POA: Diagnosis present

## 2017-06-18 DIAGNOSIS — M7989 Other specified soft tissue disorders: Secondary | ICD-10-CM | POA: Diagnosis present

## 2017-06-18 DIAGNOSIS — K5792 Diverticulitis of intestine, part unspecified, without perforation or abscess without bleeding: Secondary | ICD-10-CM

## 2017-06-18 DIAGNOSIS — D638 Anemia in other chronic diseases classified elsewhere: Secondary | ICD-10-CM | POA: Diagnosis present

## 2017-06-18 DIAGNOSIS — D61810 Antineoplastic chemotherapy induced pancytopenia: Secondary | ICD-10-CM | POA: Diagnosis present

## 2017-06-18 DIAGNOSIS — R600 Localized edema: Secondary | ICD-10-CM | POA: Diagnosis present

## 2017-06-18 DIAGNOSIS — Z6841 Body Mass Index (BMI) 40.0 and over, adult: Secondary | ICD-10-CM

## 2017-06-18 DIAGNOSIS — M25612 Stiffness of left shoulder, not elsewhere classified: Secondary | ICD-10-CM

## 2017-06-18 DIAGNOSIS — Z96653 Presence of artificial knee joint, bilateral: Secondary | ICD-10-CM | POA: Diagnosis present

## 2017-06-18 DIAGNOSIS — K9409 Other complications of colostomy: Secondary | ICD-10-CM | POA: Diagnosis present

## 2017-06-18 DIAGNOSIS — J449 Chronic obstructive pulmonary disease, unspecified: Secondary | ICD-10-CM | POA: Diagnosis present

## 2017-06-18 DIAGNOSIS — R0602 Shortness of breath: Secondary | ICD-10-CM

## 2017-06-18 DIAGNOSIS — E876 Hypokalemia: Secondary | ICD-10-CM | POA: Diagnosis not present

## 2017-06-18 DIAGNOSIS — E039 Hypothyroidism, unspecified: Secondary | ICD-10-CM | POA: Diagnosis present

## 2017-06-18 DIAGNOSIS — K439 Ventral hernia without obstruction or gangrene: Secondary | ICD-10-CM | POA: Diagnosis present

## 2017-06-18 DIAGNOSIS — Z9981 Dependence on supplemental oxygen: Secondary | ICD-10-CM

## 2017-06-18 DIAGNOSIS — M7502 Adhesive capsulitis of left shoulder: Secondary | ICD-10-CM | POA: Diagnosis present

## 2017-06-18 DIAGNOSIS — T451X1A Poisoning by antineoplastic and immunosuppressive drugs, accidental (unintentional), initial encounter: Secondary | ICD-10-CM

## 2017-06-18 DIAGNOSIS — D704 Cyclic neutropenia: Secondary | ICD-10-CM

## 2017-06-18 DIAGNOSIS — Z87891 Personal history of nicotine dependence: Secondary | ICD-10-CM

## 2017-06-18 DIAGNOSIS — E8770 Fluid overload, unspecified: Secondary | ICD-10-CM | POA: Diagnosis not present

## 2017-06-18 DIAGNOSIS — D61818 Other pancytopenia: Secondary | ICD-10-CM

## 2017-06-18 DIAGNOSIS — I2510 Atherosclerotic heart disease of native coronary artery without angina pectoris: Secondary | ICD-10-CM | POA: Diagnosis present

## 2017-06-18 DIAGNOSIS — D702 Other drug-induced agranulocytosis: Secondary | ICD-10-CM

## 2017-06-18 DIAGNOSIS — K1230 Oral mucositis (ulcerative), unspecified: Secondary | ICD-10-CM | POA: Diagnosis present

## 2017-06-18 LAB — COMPREHENSIVE METABOLIC PANEL
ALK PHOS: 45 U/L (ref 38–126)
ALT: 17 U/L (ref 14–54)
AST: 25 U/L (ref 15–41)
Albumin: 2.9 g/dL — ABNORMAL LOW (ref 3.5–5.0)
Anion gap: 8 (ref 5–15)
BUN: 19 mg/dL (ref 6–20)
CALCIUM: 9.4 mg/dL (ref 8.9–10.3)
CHLORIDE: 111 mmol/L (ref 101–111)
CO2: 23 mmol/L (ref 22–32)
CREATININE: 1.02 mg/dL — AB (ref 0.44–1.00)
GFR calc Af Amer: >60 mL/min (ref >60)
GFR calc non Af Amer: 54 mL/min — ABNORMAL LOW (ref >60)
GLUCOSE: 104 mg/dL — AB (ref 65–99)
Potassium: 4.1 mmol/L (ref 3.5–5.1)
SODIUM: 142 mmol/L (ref 135–145)
Total Bilirubin: 1.7 mg/dL — ABNORMAL HIGH (ref 0.3–1.2)
Total Protein: 6.7 g/dL (ref 6.5–8.1)

## 2017-06-18 LAB — CBC WITH DIFFERENTIAL/PLATELET
ABS IMMATURE GRANULOCYTES: 0 10*3/uL (ref 0.0–0.1)
BASOS ABS: 0 10*3/uL (ref 0.0–0.1)
BASOS PCT: 0 %
Eosinophils Absolute: 0.1 10*3/uL (ref 0.0–0.7)
Eosinophils Relative: 3 %
HCT: 26.9 % — ABNORMAL LOW (ref 36.0–46.0)
HEMOGLOBIN: 8.3 g/dL — AB (ref 12.0–15.0)
IMMATURE GRANULOCYTES: 1 %
LYMPHS PCT: 39 %
Lymphs Abs: 0.7 10*3/uL (ref 0.7–4.0)
MCH: 27.3 pg (ref 26.0–34.0)
MCHC: 30.9 g/dL (ref 30.0–36.0)
MCV: 88.5 fL (ref 78.0–100.0)
MONO ABS: 0 10*3/uL — AB (ref 0.1–1.0)
MONOS PCT: 1 %
NEUTROS ABS: 1.1 10*3/uL — AB (ref 1.7–7.7)
NEUTROS PCT: 56 %
PLATELETS: 152 10*3/uL (ref 150–400)
RBC: 3.04 MIL/uL — ABNORMAL LOW (ref 3.87–5.11)
RDW: 15.7 % — ABNORMAL HIGH (ref 11.5–15.5)
WBC: 1.9 10*3/uL — ABNORMAL LOW (ref 4.0–10.5)

## 2017-06-18 LAB — URINALYSIS, ROUTINE W REFLEX MICROSCOPIC
Bilirubin Urine: NEGATIVE
Glucose, UA: NEGATIVE mg/dL
HGB URINE DIPSTICK: NEGATIVE
Ketones, ur: NEGATIVE mg/dL
Leukocytes, UA: NEGATIVE
NITRITE: NEGATIVE
PROTEIN: NEGATIVE mg/dL
SPECIFIC GRAVITY, URINE: 1.019 (ref 1.005–1.030)
pH: 5 (ref 5.0–8.0)

## 2017-06-18 LAB — I-STAT CG4 LACTIC ACID, ED: Lactic Acid, Venous: 1.01 mmol/L (ref 0.5–1.9)

## 2017-06-18 IMAGING — DX DG CHEST 1V PORT
1 series · 1 of 1 positions shown · non-contrast
Comparison: Prior chest x-ray [DATE]

CLINICAL DATA: 72-year-old female with pain and swelling at her
colostomy stoma.

EXAM:
PORTABLE CHEST 1 VIEW

[chest ap]
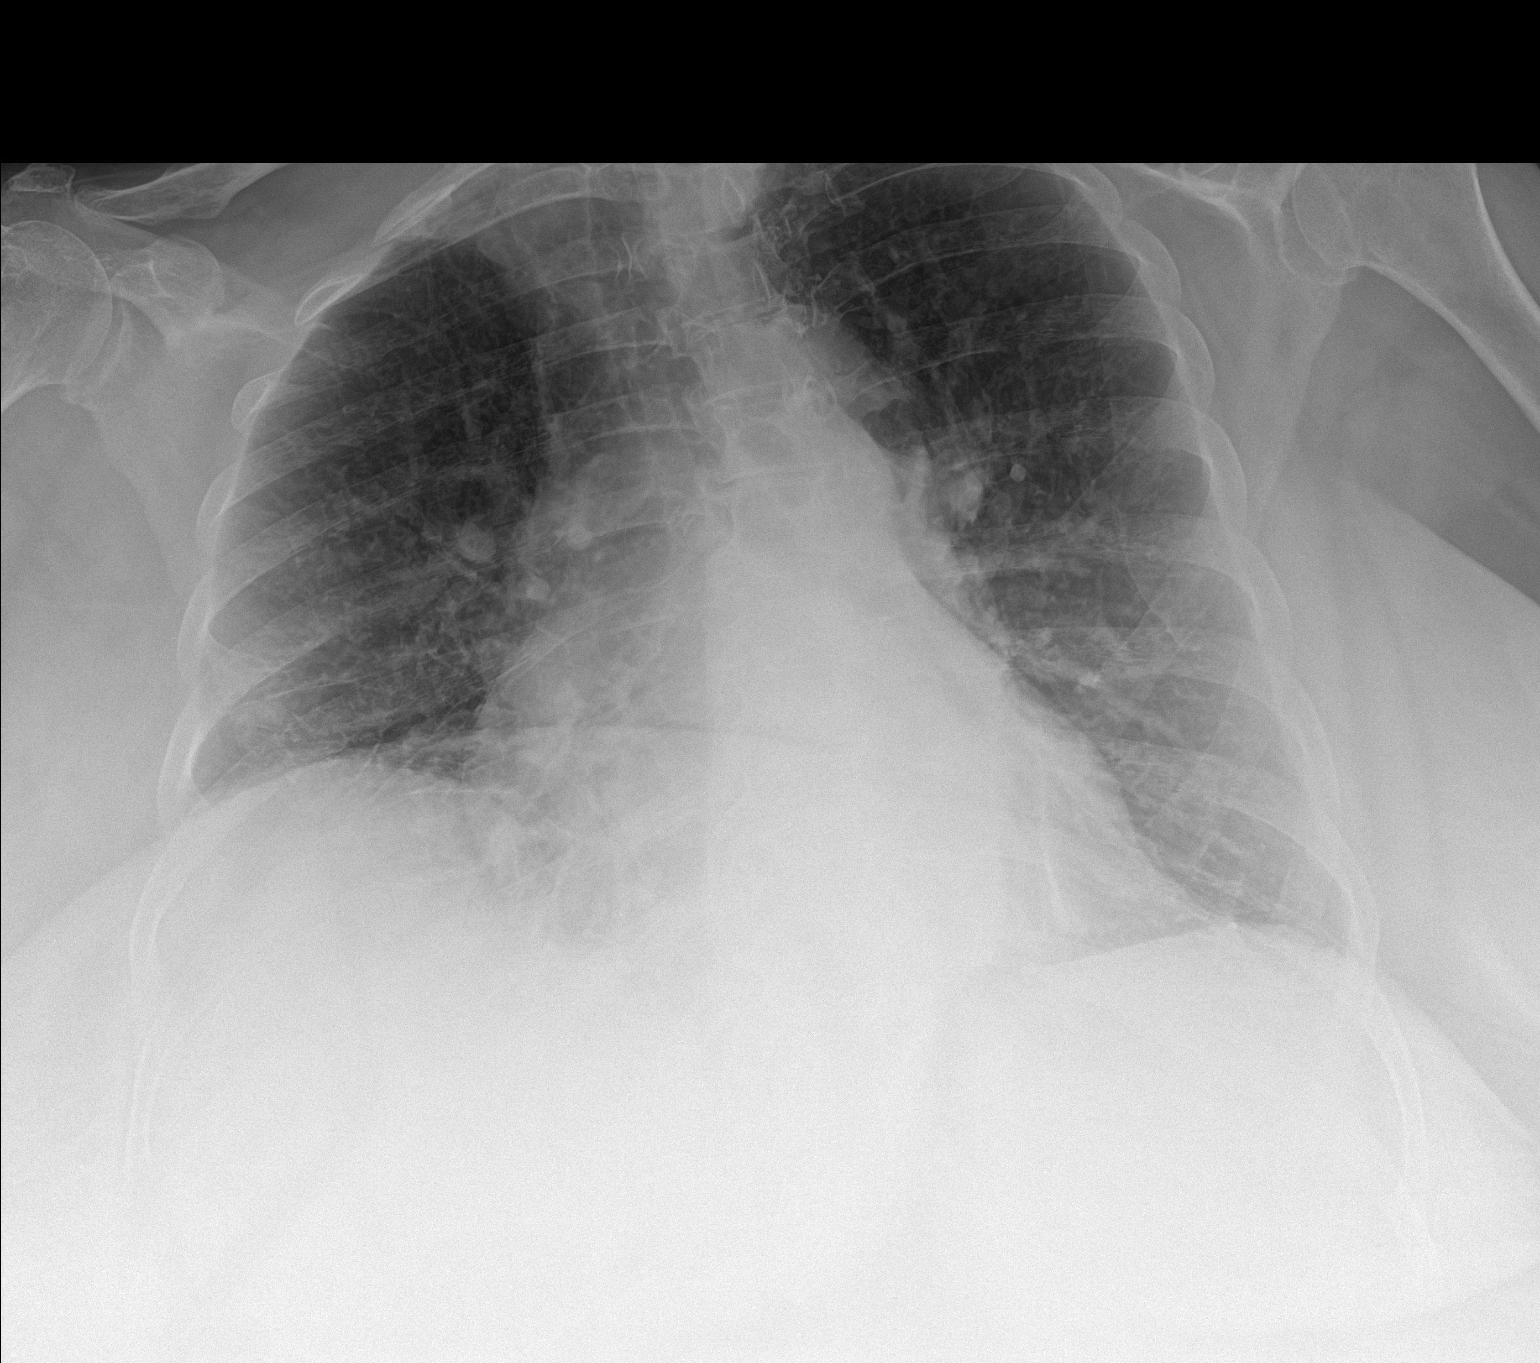

[1 of 1 positions shown; findings below may reference images not displayed]

FINDINGS: The patient is rotated to the right which distorts the cardiac and
mediastinal contours. Within these limits, no significant interval
change. Atherosclerotic calcifications are present in the transverse
aorta. Chronic elevation of the right hemidiaphragm with slightly
increased right basilar atelectasis. No pulmonary edema, pleural
effusion or pneumothorax. No suspicious nodule or mass. No acute
osseous abnormality.
IMPRESSION: Given rightward rotated position, no acute cardiopulmonary process.

## 2017-06-18 IMAGING — CT CT ABD-PELV W/ CM
2 of 5 series · 16 of 46 positions shown, 18 images · IV contrast (Omni 300)
Comparison: [DATE]

CLINICAL DATA: Abdominal pain and fever

EXAM:
CT ABDOMEN AND PELVIS WITH CONTRAST
TECHNIQUE: Multidetector CT imaging of the abdomen and pelvis was performed
using the standard protocol following bolus administration of
intravenous contrast.
CONTRAST:  100mL OMNIPAQUE IOHEXOL 300 MG/ML  SOLN

[Series 3: a/p w/ 5mm · axial · 0.98mm/px · z∈[-313,+132]mm · 13 of 99 slices shown, 15 images]
[im 5/99  soft-tissue]
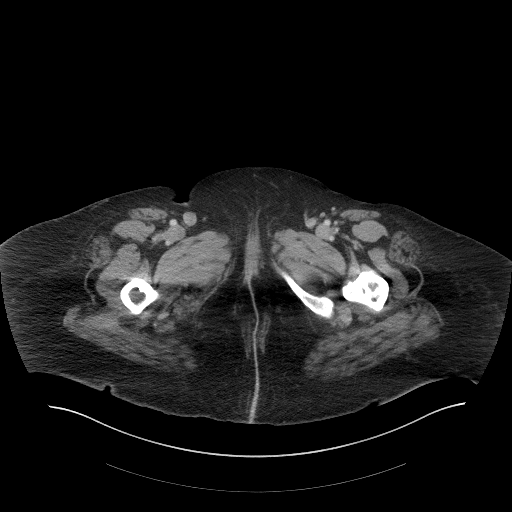
[im 5/99  bone]
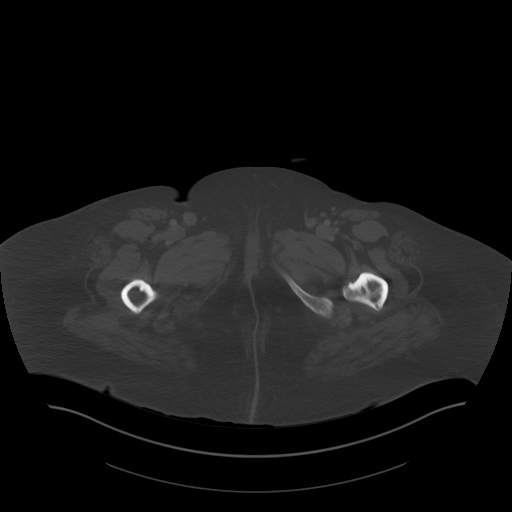
[im 15/99  soft-tissue]
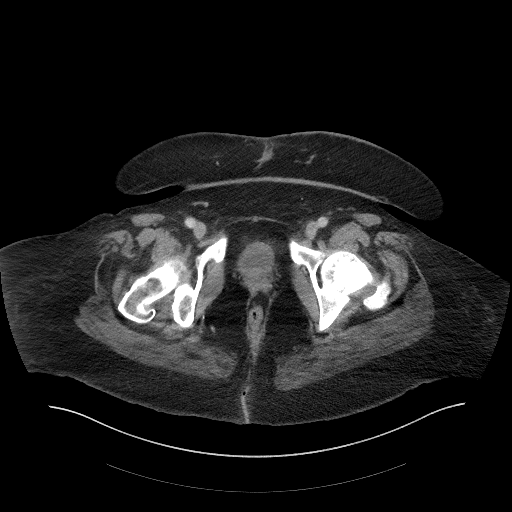
[im 20/99  soft-tissue]
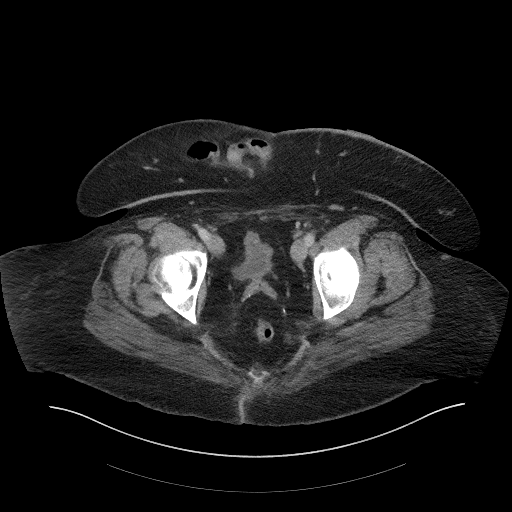
[im 30/99  soft-tissue]
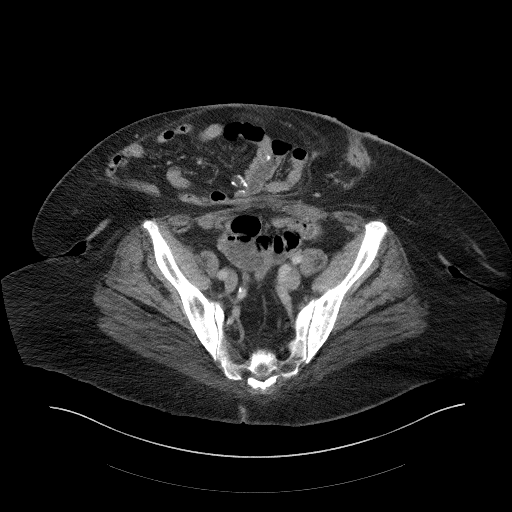
[im 35/99  soft-tissue]
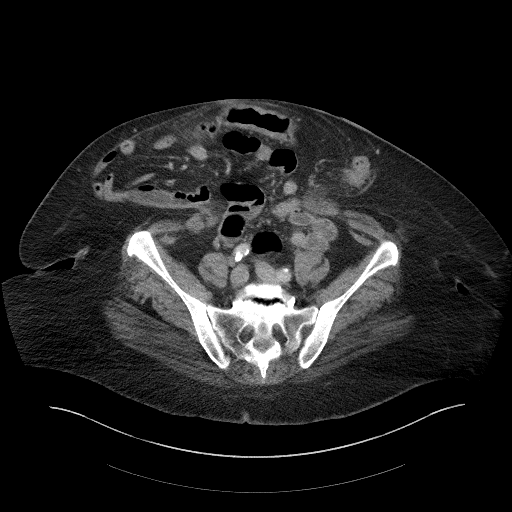
[im 45/99  soft-tissue]
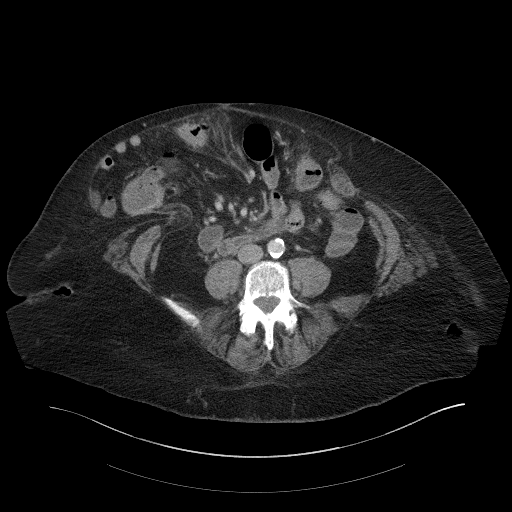
[im 50/99  soft-tissue]
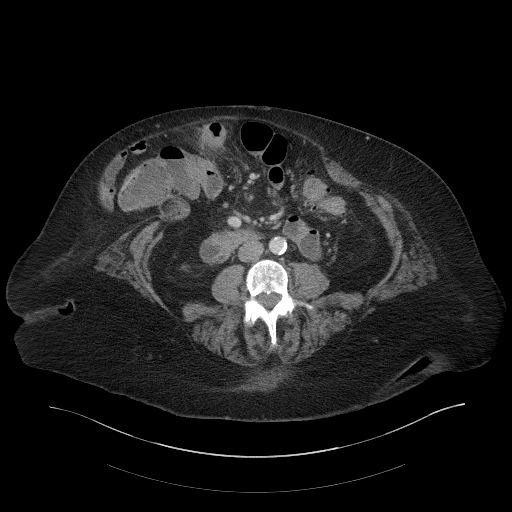
[im 54/99  soft-tissue]
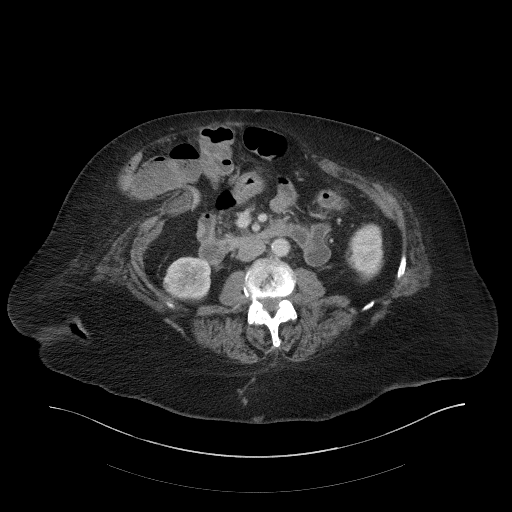
[im 64/99  soft-tissue]
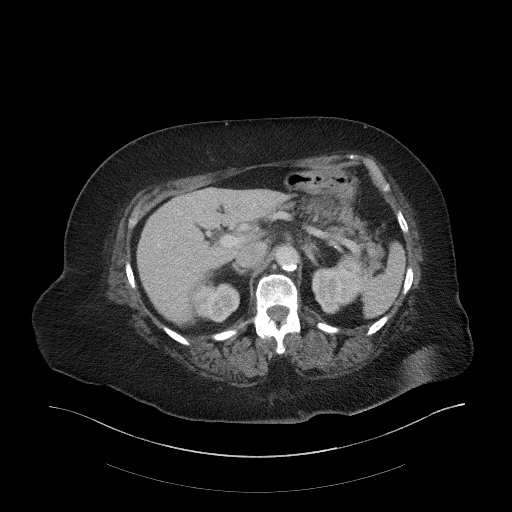
[im 64/99  bone]
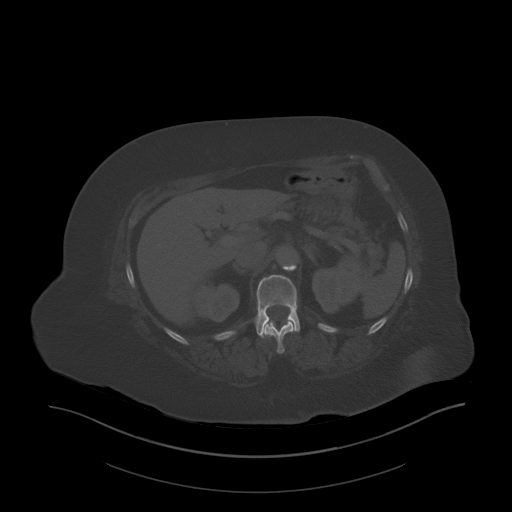
[im 69/99  soft-tissue]
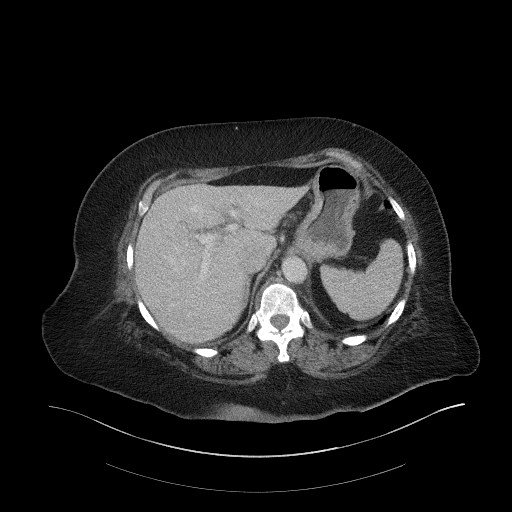
[im 79/99  soft-tissue]
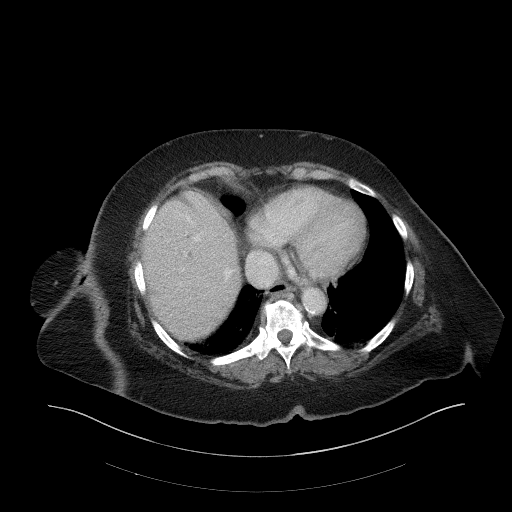
[im 84/99  soft-tissue]
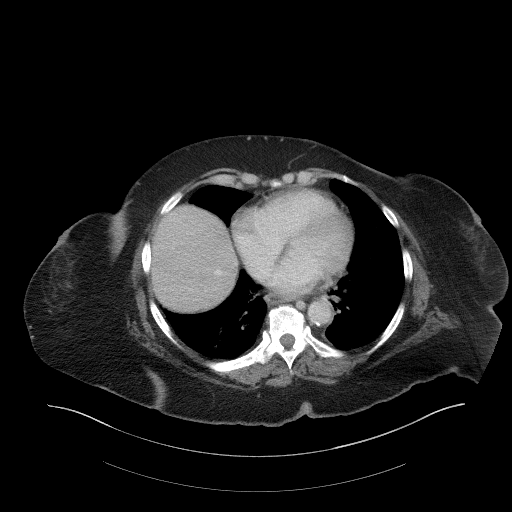
[im 94/99  soft-tissue]
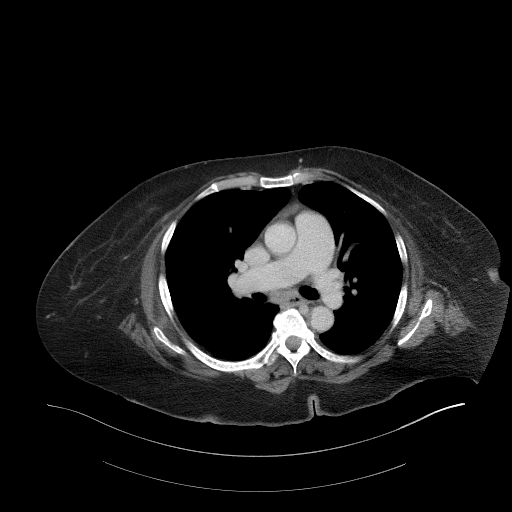

[Series 6: a/p w/ cor · coronal · 0.96mm/px · 3 of 155 slices shown]
[im 52/155  soft-tissue]
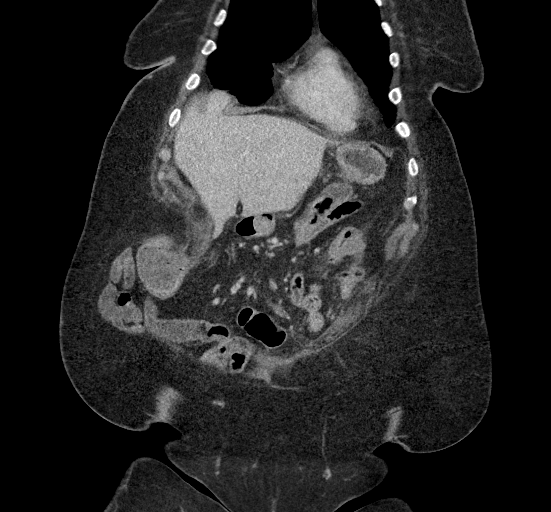
[im 69/155  soft-tissue]
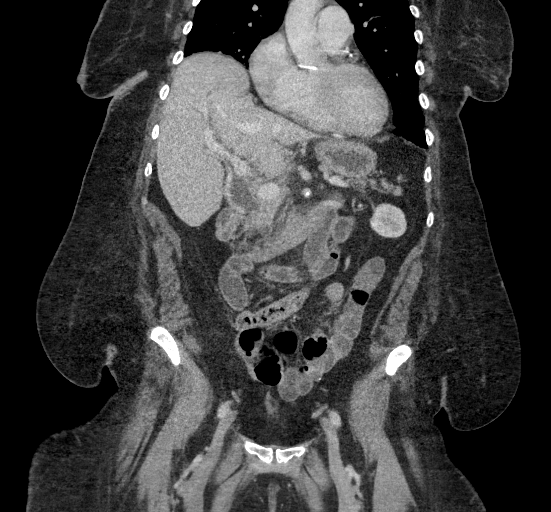
[im 86/155  soft-tissue]
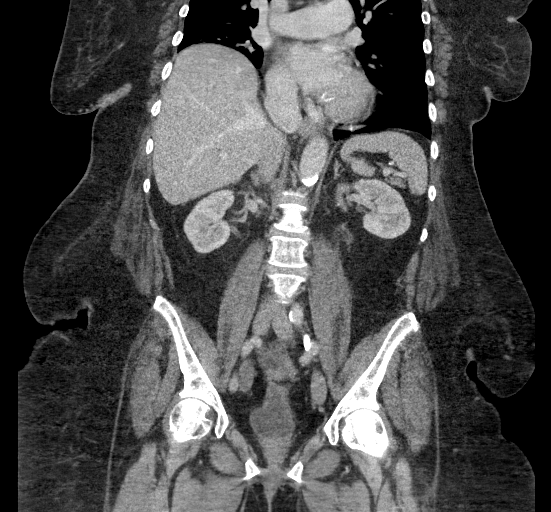

[16 of 46 positions shown; findings below may reference images not displayed]

FINDINGS: LOWER CHEST: Focal atelectasis at the right lung base. Coronary
artery calcifications.

HEPATOBILIARY: Normal hepatic contours and density. No intra- or
extrahepatic biliary dilatation. Status post cholecystectomy.

PANCREAS: Generalized pancreatic atrophy.

SPLEEN: Normal.

ADRENALS/URINARY TRACT:

--Adrenal glands: Normal.

--Right kidney/ureter: No hydronephrosis, nephroureterolithiasis,
perinephric stranding or solid renal mass.

--Left kidney/ureter: No hydronephrosis, nephroureterolithiasis,
perinephric stranding or solid renal mass.

--Urinary bladder: Nondistended.

STOMACH/BOWEL:

--Stomach/Duodenum: No hiatal hernia or other gastric abnormality.
Normal duodenal course.

--Small bowel: Large ventral abdominal hernia containing multiple
loops of nondilated small bowel. There is a surgical anastomosis
within the hernia.

--Colon: A segment of the transverse colon is contained within the
ventral abdominal hernia. There is diverticulosis of this segment
with mild surrounding acute inflammation. Status post Hartmann
procedure with left lower quadrant colostomy.

--Appendix: Not visualized. No right lower quadrant inflammation or
free fluid.

VASCULAR/LYMPHATIC: Atherosclerotic calcification is present within
the non-aneurysmal abdominal aorta, without hemodynamically
significant stenosis. The portal vein, splenic vein, superior
mesenteric vein and IVC are patent. No abdominal or pelvic
lymphadenopathy.

REPRODUCTIVE: Status post hysterectomy. No adnexal mass.

MUSCULOSKELETAL. No bony spinal canal stenosis or focal osseous
abnormality.

OTHER: None.
IMPRESSION: 1. Acute diverticulitis of a portion of the proximal colon contained
within the ventral abdominal hernia. No abscess or free
intraperitoneal air.
2. Large ventral abdominal hernia containing small bowel and colon
without obstruction.
3. Status post Hartmann procedure with left lower quadrant
colostomy. No obstruction.
4.  Aortic Atherosclerosis ([OM]-[OM]).

## 2017-06-18 MED ORDER — SIMVASTATIN 40 MG PO TABS
40.0000 mg | ORAL_TABLET | Freq: Every day | ORAL | Status: DC
  Administered 2017-06-19 – 2017-07-02 (×14): 40 mg via ORAL
  Filled 2017-06-18 (×14): qty 1

## 2017-06-18 MED ORDER — HYDROCODONE-ACETAMINOPHEN 10-325 MG PO TABS
1.0000 | ORAL_TABLET | ORAL | Status: DC | PRN
  Administered 2017-06-18 – 2017-07-02 (×23): 1 via ORAL
  Filled 2017-06-18 (×23): qty 1

## 2017-06-18 MED ORDER — CIPROFLOXACIN IN D5W 400 MG/200ML IV SOLN
400.0000 mg | Freq: Once | INTRAVENOUS | Status: AC
  Administered 2017-06-18: 400 mg via INTRAVENOUS
  Filled 2017-06-18: qty 200

## 2017-06-18 MED ORDER — METRONIDAZOLE IN NACL 5-0.79 MG/ML-% IV SOLN
500.0000 mg | Freq: Once | INTRAVENOUS | Status: AC
  Administered 2017-06-18: 500 mg via INTRAVENOUS
  Filled 2017-06-18: qty 100

## 2017-06-18 MED ORDER — LEVOTHYROXINE SODIUM 50 MCG PO TABS
50.0000 ug | ORAL_TABLET | Freq: Every day | ORAL | Status: DC
  Administered 2017-06-19 – 2017-07-02 (×14): 50 ug via ORAL
  Filled 2017-06-18 (×15): qty 1

## 2017-06-18 MED ORDER — ACETAMINOPHEN 650 MG RE SUPP: 650.0000 mg | Freq: Four times a day (QID) | RECTAL | Status: DC | PRN

## 2017-06-18 MED ORDER — IBUPROFEN 200 MG PO TABS
600.0000 mg | ORAL_TABLET | Freq: Four times a day (QID) | ORAL | Status: DC | PRN
  Administered 2017-06-26: 600 mg via ORAL
  Filled 2017-06-18: qty 3

## 2017-06-18 MED ORDER — CYCLOSPORINE 0.05 % OP EMUL
1.0000 [drp] | Freq: Two times a day (BID) | OPHTHALMIC | Status: DC
  Administered 2017-06-18 – 2017-07-02 (×28): 1 [drp] via OPHTHALMIC
  Filled 2017-06-18 (×29): qty 1

## 2017-06-18 MED ORDER — ACETAMINOPHEN 325 MG PO TABS
650.0000 mg | ORAL_TABLET | Freq: Four times a day (QID) | ORAL | Status: DC | PRN
  Administered 2017-06-24 – 2017-07-02 (×3): 650 mg via ORAL
  Filled 2017-06-18 (×3): qty 2

## 2017-06-18 MED ORDER — FOLIC ACID 1 MG PO TABS
1.0000 mg | ORAL_TABLET | Freq: Every day | ORAL | Status: DC
  Administered 2017-06-19: 1 mg via ORAL
  Filled 2017-06-18: qty 1

## 2017-06-18 MED ORDER — FERROUS SULFATE 325 (65 FE) MG PO TABS: 325.0000 mg | ORAL_TABLET | Freq: Every day | ORAL | Status: DC

## 2017-06-18 MED ORDER — METHOTREXATE 2.5 MG PO TABS: 20.0000 mg | ORAL_TABLET | ORAL | Status: DC

## 2017-06-18 MED ORDER — ENOXAPARIN SODIUM 40 MG/0.4ML ~~LOC~~ SOLN
40.0000 mg | SUBCUTANEOUS | Status: DC
  Administered 2017-06-18 – 2017-06-21 (×4): 40 mg via SUBCUTANEOUS
  Filled 2017-06-18 (×4): qty 0.4

## 2017-06-18 MED ORDER — IOHEXOL 300 MG/ML  SOLN
100.0000 mL | Freq: Once | INTRAMUSCULAR | Status: AC | PRN
  Administered 2017-06-18: 100 mL via INTRAVENOUS

## 2017-06-18 MED ORDER — SODIUM CHLORIDE 0.9 % IV SOLN
INTRAVENOUS | Status: AC
  Administered 2017-06-18 – 2017-06-19 (×2): via INTRAVENOUS

## 2017-06-18 NOTE — ED Provider Notes (Signed)
Patient placed in Quick Look pathway, seen and evaluated   Chief Complaint: Pt complains of breakdown of ostomy site  HPI:   Pt reports site is irritated,   ROS: no fever  Physical Exam:   Gen: No distress  Neuro: Awake and Alert  Skin: Warm    Focused Exam: site swollen some protruding tissue irritation    Initiation of care has begun. The patient has been counseled on the process, plan, and necessity for staying for the completion/evaluation, and the remainder of the medical screening examination   [name] 06/18/17 1816    Carmin Muskrat, MD 06/18/17 [id number]

## 2017-06-18 NOTE — ED Notes (Signed)
Family medicine bedside at this time

## 2017-06-18 NOTE — ED Provider Notes (Signed)
[facility] EMERGENCY DEPARTMENT Provider Note   CSN: [id number] Arrival date & time: 06/18/17  1803     History   Chief Complaint Chief Complaint  Patient presents with  . Stoma Breakdown    HPI [name] is a 72 y.o. female.  HPI Vision presents with concern of fever, generalized discomfort and stoma breakdown. Patient has had colostomy for possibly 15 years, and he generally well until about 3 days ago, when she developed diffuse discomfort, without focal pain. Over the last 3 days she has had persistency of discomfort, with worsening over the past few hours, without specific precipitant. No chest pain, no dyspnea, generalized discomfort is been persistent, and she now complains of additional erosion of the area around her stoma. Patient continues to have bowel production, denies vomiting. She is here with a companion who notes the patient has also had a new fever. Since onset of this illness, no clear relieving or exacerbating factors. Past Medical History:  Diagnosis Date  . Anemia    takes Ferrous Sulfate daily  . Arthritis   . Chronic bronchitis ([place])    Combivent if needed)  . Colostomy care [facility]) 2004  . Coronary artery disease   . Diverticular disease   . Dizziness   . Dry eyes    uses Restasis daily  . H/O hiatal hernia   . History of blood transfusion    no abnormal reaction noted  . History of colon polyps    benign  . Hyperlipidemia    takes Zocor daily  . Hypertension    takes Metoprolol daily  . Hypothyroidism    takes Synthroid daily  . Nocturia    states only d/t being on Lasix  . Numbness and tingling    left foot  . Peripheral edema    takes Lasix daily  . Rheumatoid arthritis(714.0)    Methotrexate 2 times a week  . Shortness of breath    with exertion    Patient Active Problem List   Diagnosis Date Noted  . 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 S/P revision of total knee 10/24/2015    Past  Surgical History:  Procedure Laterality Date  . ABDOMINAL HYSTERECTOMY  1965  . CAPSULOTOMY Bilateral 06/01/2012   Procedure: MINOR CAPSULOTOMY;  Surgeon: Myrtha Mantis., MD;  Location: [place];  Service: Ophthalmology;  Laterality: Bilateral;  . CATARACT EXTRACTION W/PHACO  01/01/2011   Procedure: CATARACT EXTRACTION PHACO AND INTRAOCULAR LENS PLACEMENT (IOC);  Surgeon: [name], MD;  Location: [place];  Service: Ophthalmology;  Laterality: Left;  . CATARACT EXTRACTION W/PHACO  12/24/2011   Procedure: CATARACT EXTRACTION PHACO AND INTRAOCULAR LENS PLACEMENT (IOC);  Surgeon: [name], MD;  Location: [facility];  Service: Ophthalmology;  Laterality: Right;  . COLON SURGERY  2002   d/t diverticulosis  . COLONOSCOPY    . COLOSTOMY    . ESOPHAGOGASTRODUODENOSCOPY    . EYE SURGERY    . KNEE ARTHROSCOPY     bilateral  . TONSILLECTOMY    . TOTAL KNEE ARTHROPLASTY  2005/2008   bilateral  . TOTAL KNEE REVISION Right 10/24/2015   Procedure: TOTAL KNEE REVISION;  Surgeon: [name], MD;  Location: [facility];  Service: Orthopedics;  Laterality: Right;  . YAG LASER APPLICATION Bilateral 03/31/7423   Procedure: YAG LASER APPLICATION;  Surgeon: Myrtha Mantis., MD;  Location: [place];  Service: Ophthalmology;  Laterality: Bilateral;     OB History  None      Home Medications    Prior to Admission medications   Medication Sig Start Date End Date Taking? Authorizing Provider  aspirin (BAYER ASPIRIN) 325 MG tablet Take 1 tablet (325 mg total) by mouth daily. Take daily for 4 wks 10/29/15   [name], MD  cycloSPORINE (RESTASIS) 0.05 % ophthalmic emulsion Place 1 drop into both eyes 2 (two) times daily.     [provider]  etodolac (LODINE) 400 MG tablet Take 400 mg by mouth 2 (two) times daily.     [provider]  ferrous sulfate 325 (65 FE) MG tablet Take 325 mg by mouth daily.    [provider]  folic acid (FOLVITE) 1 MG tablet Take 1 mg by mouth  daily.      [provider]  furosemide (LASIX) 20 MG tablet Take 20 mg by mouth daily.      [provider]  HYDROcodone-acetaminophen (NORCO) 10-325 MG tablet Take 1 tablet by mouth every 4 (four) hours as needed for severe pain.    [provider]  Ipratropium-Albuterol (COMBIVENT RESPIMAT) 20-100 MCG/ACT AERS respimat Inhale 1 puff into the lungs 4 (four) times daily.     [provider]  Ipratropium-Albuterol (COMBIVENT) 20-100 MCG/ACT AERS respimat Inhale 1 puff into the lungs 2 (two) times daily as needed for wheezing.    [provider]  levothyroxine (SYNTHROID, LEVOTHROID) 50 MCG tablet Take 50 mcg by mouth daily.      [provider]  methotrexate 2.5 MG tablet Take 2.5 mg by mouth 2 (two) times a week. Give 4 tablets to = 10 mg on Saturdays and Sundays    [provider]  metoprolol tartrate (LOPRESSOR) 25 MG tablet Take 25 mg by mouth 2 (two) times daily.     [provider]  simvastatin (ZOCOR) 40 MG tablet Take 40 mg by mouth daily.     [provider]    Family History Family History  Problem Relation Age of Onset  . Anesthesia problems Neg Hx   . Hypotension Neg Hx   . Malignant hyperthermia Neg Hx   . Pseudochol deficiency Neg Hx     Social History Social History   Tobacco Use  . Smoking status: Former Smoker    Last attempt to quit: 10/22/2002    Years since quitting: 14.6  . Smokeless tobacco: Never Used  . Tobacco comment: quit smoking in 2004  Substance Use Topics  . Alcohol use: No    Comment: quit drinking in 2004  . Drug use: No     Allergies   Patient has no known allergies.   Review of Systems Review of Systems  Constitutional:       Per HPI, otherwise negative  HENT:       Per HPI, otherwise negative  Respiratory:       Per HPI, otherwise negative  Cardiovascular:       Per HPI, otherwise negative  Gastrointestinal: Positive for nausea. Negative for abdominal  pain and vomiting.  Endocrine:       Negative aside from HPI  Genitourinary:       Neg aside from HPI   Musculoskeletal:       Per HPI, otherwise negative  Skin: Positive for color change.  Neurological: Positive for weakness. Negative for syncope.     Physical Exam Updated Vital Signs BP 93/71 (BP Location: Left Arm)   Pulse 96   Temp (!) 100.4 F (38  C) (Oral)   Resp 17   SpO2 100%   Physical Exam  Constitutional: She is oriented to person, place, and time. She appears well-developed and well-nourished. No distress.  HENT:  Head: Normocephalic and atraumatic.  Eyes: Conjunctivae and EOM are normal.  Cardiovascular: Normal rate and regular rhythm.  Pulmonary/Chest: Effort normal and breath sounds normal. No stridor. No respiratory distress.  Abdominal: She exhibits no distension.    Musculoskeletal: She exhibits no edema.  Neurological: She is alert and oriented to person, place, and time. No cranial nerve deficit.  Skin: Skin is warm and dry.  Psychiatric: She has a normal mood and affect.  Nursing note and vitals reviewed.    ED Treatments / Results  Labs (all labs ordered are listed, but only abnormal results are displayed) Labs Reviewed  CBC WITH DIFFERENTIAL/PLATELET - Abnormal; Notable for the following components:      Result Value   WBC 1.9 (*)    RBC 3.04 (*)    Hemoglobin 8.3 (*)    HCT 26.9 (*)    RDW 15.7 (*)    Neutro Abs 1.1 (*)    Monocytes Absolute 0.0 (*)    All other components within normal limits  COMPREHENSIVE METABOLIC PANEL - Abnormal; Notable for the following components:   Glucose, Bld 104 (*)    Creatinine, Ser 1.02 (*)    Albumin 2.9 (*)    Total Bilirubin 1.7 (*)    GFR calc non Af Amer 54 (*)    All other components within normal limits  URINALYSIS, ROUTINE W REFLEX MICROSCOPIC  I-STAT CG4 LACTIC ACID, ED    EKG None  Radiology Ct Abdomen Pelvis W Contrast  Result Date: 06/18/2017 CLINICAL DATA:  Abdominal pain and  fever EXAM: CT ABDOMEN AND PELVIS WITH CONTRAST TECHNIQUE: Multidetector CT imaging of the abdomen and pelvis was performed using the standard protocol following bolus administration of intravenous contrast. CONTRAST:  158m OMNIPAQUE IOHEXOL 300 MG/ML  SOLN COMPARISON:  07/05/2009 FINDINGS: LOWER CHEST: Focal atelectasis at the right lung base. Coronary artery calcifications. HEPATOBILIARY: Normal hepatic contours and density. No intra- or extrahepatic biliary dilatation. Status post cholecystectomy. PANCREAS: Generalized pancreatic atrophy. SPLEEN: Normal. ADRENALS/URINARY TRACT: --Adrenal glands: Normal. --Right kidney/ureter: No hydronephrosis, nephroureterolithiasis, perinephric stranding or solid renal mass. --Left kidney/ureter: No hydronephrosis, nephroureterolithiasis, perinephric stranding or solid renal mass. --Urinary bladder: Nondistended. STOMACH/BOWEL: --Stomach/Duodenum: No hiatal hernia or other gastric abnormality. Normal duodenal course. --Small bowel: Large ventral abdominal hernia containing multiple loops of nondilated small bowel. There is a surgical anastomosis within the hernia. --Colon: A segment of the transverse colon is contained within the ventral abdominal hernia. There is diverticulosis of this segment with mild surrounding acute inflammation. Status post HJeanette Capriceprocedure with left lower quadrant colostomy. --Appendix: Not visualized. No right lower quadrant inflammation or free fluid. VASCULAR/LYMPHATIC: Atherosclerotic calcification is present within the non-aneurysmal abdominal aorta, without hemodynamically significant stenosis. The portal vein, splenic vein, superior mesenteric vein and IVC are patent. No abdominal or pelvic lymphadenopathy. REPRODUCTIVE: Status post hysterectomy. No adnexal mass. MUSCULOSKELETAL. No bony spinal canal stenosis or focal osseous abnormality. OTHER: None. IMPRESSION: 1. Acute diverticulitis of a portion of the proximal colon contained within the  ventral abdominal hernia. No abscess or free intraperitoneal air. 2. Large ventral abdominal hernia containing small bowel and colon without obstruction. 3. 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 No obstruction. 4.  Aortic Atherosclerosis (ICD10-I70.0). Electronically Signed   By: KUlyses JarredM.D.   On: 06/18/2017 20:17  Dg Chest Port 1 View  Result Date: 06/18/2017 CLINICAL DATA:  72 year old female with pain and swelling at her colostomy stoma. EXAM: PORTABLE CHEST 1 VIEW COMPARISON:  Prior chest x-ray 10/22/2015 FINDINGS: The patient is rotated to the right which distorts the cardiac and mediastinal contours. Within these limits, no significant interval change. Atherosclerotic calcifications are present in the transverse aorta. Chronic elevation of the right hemidiaphragm with slightly increased right basilar atelectasis. No pulmonary edema, pleural effusion or pneumothorax. No suspicious nodule or mass. No acute osseous abnormality. IMPRESSION: Given rightward rotated position, no acute cardiopulmonary process. Electronically Signed   By: Jacqulynn Cadet M.D.   On: 06/18/2017 19:12    Procedures Procedures (including critical care time)  Medications Ordered in ED Medications  ciprofloxacin (CIPRO) IVPB 400 mg (has no administration in time range)  metroNIDAZOLE (FLAGYL) IVPB 500 mg (has no administration in time range)  iohexol (OMNIPAQUE) 300 MG/ML solution 100 mL (100 mLs Intravenous Contrast Given 06/18/17 1953)     Initial Impression / Assessment and Plan / ED Course  I have reviewed the triage vital signs and the nursing notes.  Pertinent labs & imaging results that were available during my care of the patient were reviewed by me and considered in my medical decision making (see chart for details).     8:41 PM On repeat exam the patient is awake and alert. We discussed the findings, notable for evidence of diverticulitis, as well as inflamed bowel  in her ventral hernia. Given the patient's status post surgery, and multiple areas of inflammation/infection, patient will receive ciprofloxacin, Flagyl.  Patient will require admission for further evaluation, management, monitoring. In addition, patient may receive wound care consult for her stoma, tomorrow during daylight hours.   Final Clinical Impressions(s) / ED Diagnoses  Acute diverticulitis   Carmin Muskrat, MD 06/18/17 2042

## 2017-06-18 NOTE — Progress Notes (Signed)
Pt admitted from the ED with c/o of abdominal pain fron her colostomy stoma, pt alert and oriented, c/o of pain in the abdomen, settled in bed with call light at bedside, safety concern addressed, pt was however reassured and will continue to monitor. [name], [name]

## 2017-06-18 NOTE — ED Notes (Signed)
Patient transported to CT

## 2017-06-18 NOTE — H&P (Addendum)
[facility] Admission History and Physical Service Pager: [phone]  Patient name: [name] record number: [id number] Date of birth: 11/05/1945 Age: 72 y.o. Gender: female  Primary Care Provider: Dixie Dials, MD Consultants:  Code Status: FULL   Chief Complaint: concern for stoma breakdown, abdominal pain  Assessment and Plan: [name] is a 72 y.o. female presenting with abdominal pain and concern for stoma breakdown. PMH is significant for rheumatoid arthritis, colostomy, hypothyroidism, HTN, HLD, CAD, peripheral edema  Abdominal pain 2/2 Diverticulitis: Noted on CT abd. Patient with mild abdominal pain, s/p long term stoma. Febrile to 100.4 on admission. Still producing stool with last BM this AM, so less likely constipation. No vomiting so less likely obstruction. Imaging also showed no stool burden or sign of obstruction, which is reassuring. Ventral hernia noted on imaging, however no signs of incarceration of hernia contents. Pain located primarily in RLQ, however less likely appendicitis, as would expect elevated white count rather than leukopenia, and also would expect signs of inflammation on imaging. Only mild abdominal pain on palpation.  -admit to med-surg, Attending Dr. Andria Frames -IV cipro (5/23- ) -IV metro (5/23- ) -npo sips with meds -NS 125/hr IVF - Can consider surgical consult for further evaluation of hernia  Leukopenia: 1.9 on admission. This is a new finding with WBC normal in 2017. Does take MTX for RA, which can cause leukpenia. No other obvious explanations noted.   -concern for methotrexate toxicity, will hold pending AM rounds discussion  LLE swelling/erythema: Cellulitis vs chronic venous stasis changes. Very erythematous and tender concerning for cellulitis, however sx have been ongoing for a couple months, and would not expect cellulitis to persists for such a long term, or at least to have become quite severe if  persisting for multiple months. Duration more consistent with chronic venous changes. Less likely gout given location of pain. Per patient present ~61month Pulses present,  2+ edema on right, 1+ on left.  Patient had thought this was gout but pain is most severe at anterior shin and not at toe. On IV cipro for diverticulitis; does not cover for MRSA, but will continue this for now and broaden if no improvement.  - on cipro  - Will mark borders of erythema - Broaden coverage if no improvement with MRSA coverage  Ventral Hernia: large,  no dilation of bowels or signs of incarceration on imaging, still producing stool per patient - Can consider surgical consultation  Stoma: mild excoriation, symptomatic per patient for ~1week. Does not appear to be infected on exam.  - Wound care in AM  Iron deficiency: on chronic ferrous sulfate 325 -cont home ferrous sulfate  Rheumatoid arthritis -holding home MTX in light of leukopenia  Hypothyroidism - Continue home Synthroid  HLD - Continue home simvastatin  Chronic pain: on home norco 10 Q4prn and nsaids -cont home norco -tylenol prn -ibuprofen prn  FEN/GI: npo sips w/ meds Prophylaxis: lovenox  Disposition: med-surg for IV medication  History of Present Illness:  Debra FLAIMis a 72y.o. female presenting with ~1week complaint of excoriated stoma.  She says it is red and raw for past ~1w, can't stand to touch it. Placed in 2005 due to diverticulitis. Manages stoma on her own. HH last came over a couple months ago.  Abd pain in umbilical region. Abd feels tight on right side, she has a known hx of diverticulitis but did not seem to know she had a ventral hernia.  One  episode diarrhea this AM. No N/V.   LLE pain: Ongoing for a couple months or more. Some tenderness in R leg as well but L>R. Has had gout in the past. R leg "usually has fever in it."   She was prescribed lasix in the past but has stopped taking it.  She says it was so long ago  that she does not know if her doctor stopped it or if she stopped on her own.   Review Of Systems: Per HPI with the following additions:   Review of Systems  Respiratory: Negative for shortness of breath.   Gastrointestinal: Positive for abdominal pain and diarrhea. Negative for constipation, nausea and vomiting.  Genitourinary: Negative for dysuria.  Musculoskeletal: Positive for joint pain.    Patient Active Problem List   Diagnosis Date Noted  . Diverticulitis 06/18/2017  . 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 S/P revision of total knee 10/24/2015    Past Medical History: Past Medical History:  Diagnosis Date  . Anemia    takes Ferrous Sulfate daily  . Arthritis   . Chronic bronchitis ([place])    Combivent if needed)  . Colostomy care [facility]) 2004  . Coronary artery disease   . Diverticular disease   . Dizziness   . Dry eyes    uses Restasis daily  . H/O hiatal hernia   . History of blood transfusion    no abnormal reaction noted  . History of colon polyps    benign  . Hyperlipidemia    takes Zocor daily  . Hypertension    takes Metoprolol daily  . Hypothyroidism    takes Synthroid daily  . Nocturia    states only d/t being on Lasix  . Numbness and tingling    left foot  . Peripheral edema    takes Lasix daily  . Rheumatoid arthritis(714.0)    Methotrexate 2 times a week  . Shortness of breath    with exertion    Past Surgical History: Past Surgical History:  Procedure Laterality Date  . ABDOMINAL HYSTERECTOMY  1965  . CAPSULOTOMY Bilateral 06/01/2012   Procedure: MINOR CAPSULOTOMY;  Surgeon: Myrtha Mantis., MD;  Location: [facility];  Service: Ophthalmology;  Laterality: Bilateral;  . CATARACT EXTRACTION W/PHACO  01/01/2011   Procedure: CATARACT EXTRACTION PHACO AND INTRAOCULAR LENS PLACEMENT (IOC);  Surgeon: [name], MD;  Location: [facility];  Service: Ophthalmology;  Laterality: Left;  . CATARACT EXTRACTION W/PHACO  12/24/2011   Procedure:  CATARACT EXTRACTION PHACO AND INTRAOCULAR LENS PLACEMENT (IOC);  Surgeon: [name], MD;  Location: [facility];  Service: Ophthalmology;  Laterality: Right;  . COLON SURGERY  2002   d/t diverticulosis  . COLONOSCOPY    . COLOSTOMY    . ESOPHAGOGASTRODUODENOSCOPY    . EYE SURGERY    . KNEE ARTHROSCOPY     bilateral  . TONSILLECTOMY    . TOTAL KNEE ARTHROPLASTY  2005/2008   bilateral  . TOTAL KNEE REVISION Right 10/24/2015   Procedure: TOTAL KNEE REVISION;  Surgeon: [name], MD;  Location: [facility];  Service: Orthopedics;  Laterality: Right;  . YAG LASER APPLICATION Bilateral 04/01/5730   Procedure: YAG LASER APPLICATION;  Surgeon: Myrtha Mantis., MD;  Location: [facility];  Service: Ophthalmology;  Laterality: Bilateral;    Social History: Social History   Tobacco Use  . Smoking status: Former Smoker    Last attempt to quit: 10/22/2002    Years since quitting: 14.6  .  Smokeless tobacco: Never Used  . Tobacco comment: quit smoking in 2004  Substance Use Topics  . Alcohol use: No    Comment: quit drinking in 2004  . Drug use: No   Additional social history: Former smoker (began age 87, quit 2004; smoked about 2ppd). No EtOH use (none since 2004). No illicit drug use. Lives alone but sister lives nearby. Daughter also involved in care.  Please also refer to relevant sections of EMR.  Family History: Family History  Problem Relation Age of Onset  . Anesthesia problems Neg Hx   . Hypotension Neg Hx   . Malignant hyperthermia Neg Hx   . Pseudochol deficiency Neg Hx     Allergies and Medications: No Known Allergies No current facility-administered medications on file prior to encounter.    Current Outpatient Medications on File Prior to Encounter  Medication Sig Dispense Refill  . cycloSPORINE (RESTASIS) 0.05 % ophthalmic emulsion Place 1 drop into both eyes 2 (two) times daily.     Marland Kitchen etodolac (LODINE) 400 MG tablet Take 400 mg by mouth 2 (two) times daily.     . ferrous  sulfate 325 (65 FE) MG tablet Take 325 mg by mouth daily.    . folic acid (FOLVITE) 1 MG tablet Take 1 mg by mouth daily.      Marland Kitchen HYDROcodone-acetaminophen (NORCO) 10-325 MG tablet Take 1 tablet by mouth every 4 (four) hours as needed for severe pain.    . Ipratropium-Albuterol (COMBIVENT RESPIMAT) 20-100 MCG/ACT AERS respimat Inhale 1 puff into the lungs 2 (two) times daily.     Marland Kitchen levothyroxine (SYNTHROID, LEVOTHROID) 50 MCG tablet Take 50 mcg by mouth daily.      . methotrexate 2.5 MG tablet Take 20 mg by mouth every Friday.     . metoprolol tartrate (LOPRESSOR) 25 MG tablet Take 25 mg by mouth 2 (two) times daily.     . naproxen sodium (ALEVE) 220 MG tablet Take 440 mg by mouth 2 (two) times daily as needed (for arthritic pain).    . simvastatin (ZOCOR) 40 MG tablet Take 40 mg by mouth daily.     Marland Kitchen aspirin (BAYER ASPIRIN) 325 MG tablet Take 1 tablet (325 mg total) by mouth daily. Take daily for 4 wks (Patient not taking: Reported on 06/18/2017)    . furosemide (LASIX) 20 MG tablet Take 20 mg by mouth daily.        Objective: BP (!) 156/89   Pulse 90   Temp (!) 100.4 F (38 C) (Oral)   Resp 17   SpO2 96%  Exam: General: obese female, uncomfortable but NAD Eyes: clear eyes with no injection/discharge ENTM: no rhinonorhea, mmm Cardiovascular: RRR, 2/6 systolic murmur Respiratory: CTAB, no crackles/wheezes, no IWB Gastrointestinal: mild belly pain with TTP, large ventral hernia difficult to distinguish on palpation due to significant size, BS normal, excoriated stoma w/ dressing intact MSK: 2+ pitting edema on left with erythema to mid calf, pulses/motor control/sensation intact, skin intact with no obvious wounding, surgery scar on left knee Neuro: grossly intact, no deficits noted Psych: AO with appropriate thought processing  Labs and Imaging: CBC BMET  Recent Labs  Lab 06/18/17 1837  WBC 1.9*  HGB 8.3*  HCT 26.9*  PLT 152   Recent Labs  Lab 06/18/17 1837  NA 142  K 4.1   CL 111  CO2 23  BUN 19  CREATININE 1.02*  GLUCOSE 104*  CALCIUM 9.4     Ct Abdomen Pelvis W Contrast  Result Date: 06/18/2017 CLINICAL DATA:  Abdominal pain and fever EXAM: CT ABDOMEN AND PELVIS WITH CONTRAST TECHNIQUE: Multidetector CT imaging of the abdomen and pelvis was performed using the standard protocol following bolus administration of intravenous contrast. CONTRAST:  145m OMNIPAQUE IOHEXOL 300 MG/ML  SOLN COMPARISON:  07/05/2009 FINDINGS: LOWER CHEST: Focal atelectasis at the right lung base. Coronary artery calcifications. HEPATOBILIARY: Normal hepatic contours and density. No intra- or extrahepatic biliary dilatation. Status post cholecystectomy. PANCREAS: Generalized pancreatic atrophy. SPLEEN: Normal. ADRENALS/URINARY TRACT: --Adrenal glands: Normal. --Right kidney/ureter: No hydronephrosis, nephroureterolithiasis, perinephric stranding or solid renal mass. --Left kidney/ureter: No hydronephrosis, nephroureterolithiasis, perinephric stranding or solid renal mass. --Urinary bladder: Nondistended. STOMACH/BOWEL: --Stomach/Duodenum: No hiatal hernia or other gastric abnormality. Normal duodenal course. --Small bowel: Large ventral abdominal hernia containing multiple loops of nondilated small bowel. There is a surgical anastomosis within the hernia. --Colon: A segment of the transverse colon is contained within the ventral abdominal hernia. There is diverticulosis of this segment with mild surrounding acute inflammation. Status post HJeanette Capriceprocedure with left lower quadrant colostomy. --Appendix: Not visualized. No right lower quadrant inflammation or free fluid. VASCULAR/LYMPHATIC: Atherosclerotic calcification is present within the non-aneurysmal abdominal aorta, without hemodynamically significant stenosis. The portal vein, splenic vein, superior mesenteric vein and IVC are patent. No abdominal or pelvic lymphadenopathy. REPRODUCTIVE: Status post hysterectomy. No adnexal mass.  MUSCULOSKELETAL. No bony spinal canal stenosis or focal osseous abnormality. OTHER: None. IMPRESSION: 1. Acute diverticulitis of a portion of the proximal colon contained within the ventral abdominal hernia. No abscess or free intraperitoneal air. 2. Large ventral abdominal hernia containing small bowel and colon without obstruction. 3. 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 No obstruction. 4.  Aortic Atherosclerosis (ICD10-I70.0). Electronically Signed   By: KUlyses JarredM.D.   On: 06/18/2017 20:17   Dg Chest Port 1 View  Result Date: 06/18/2017 CLINICAL DATA:  72year old female with pain and swelling at her colostomy stoma. EXAM: PORTABLE CHEST 1 VIEW COMPARISON:  Prior chest x-ray 10/22/2015 FINDINGS: The patient is rotated to the right which distorts the cardiac and mediastinal contours. Within these limits, no significant interval change. Atherosclerotic calcifications are present in the transverse aorta. Chronic elevation of the right hemidiaphragm with slightly increased right basilar atelectasis. No pulmonary edema, pleural effusion or pneumothorax. No suspicious nodule or mass. No acute osseous abnormality. IMPRESSION: Given rightward rotated position, no acute cardiopulmonary process. Electronically Signed   By: HJacqulynn CadetM.D.   On: 06/18/2017 19:12     BSherene Sires DO 06/18/2017, 9:42 PM PGY-1, [facility] pager: [phone] text pages welcome  UPPER LEVEL ADDENDUM  I have read the above note and made revisions highlighted in orange.  AAdin Hector MD, MPH PGY-3 [facility] Pager [phone]

## 2017-06-18 NOTE — ED Notes (Signed)
ED Provider at bedside. 

## 2017-06-18 NOTE — ED Triage Notes (Signed)
Pt to ED c/o colostomy stoma discomfort - swelling and stoma breakdown noted. Patient denies fevers/chills.

## 2017-06-19 ENCOUNTER — Other Ambulatory Visit: Payer: Self-pay

## 2017-06-19 DIAGNOSIS — Z933 Colostomy status: Secondary | ICD-10-CM | POA: Diagnosis not present

## 2017-06-19 DIAGNOSIS — K9409 Other complications of colostomy: Secondary | ICD-10-CM

## 2017-06-19 DIAGNOSIS — M069 Rheumatoid arthritis, unspecified: Secondary | ICD-10-CM | POA: Diagnosis not present

## 2017-06-19 DIAGNOSIS — K5792 Diverticulitis of intestine, part unspecified, without perforation or abscess without bleeding: Secondary | ICD-10-CM | POA: Diagnosis not present

## 2017-06-19 DIAGNOSIS — K5732 Diverticulitis of large intestine without perforation or abscess without bleeding: Secondary | ICD-10-CM | POA: Diagnosis not present

## 2017-06-19 DIAGNOSIS — L03116 Cellulitis of left lower limb: Secondary | ICD-10-CM | POA: Diagnosis not present

## 2017-06-19 DIAGNOSIS — K439 Ventral hernia without obstruction or gangrene: Secondary | ICD-10-CM | POA: Diagnosis not present

## 2017-06-19 DIAGNOSIS — K1230 Oral mucositis (ulcerative), unspecified: Secondary | ICD-10-CM | POA: Diagnosis not present

## 2017-06-19 DIAGNOSIS — D61810 Antineoplastic chemotherapy induced pancytopenia: Secondary | ICD-10-CM | POA: Diagnosis not present

## 2017-06-19 LAB — CBC
HCT: 23.9 % — ABNORMAL LOW (ref 36.0–46.0)
HCT: 23.7 % — ABNORMAL LOW (ref 36.0–46.0)
Hemoglobin: 7.2 g/dL — ABNORMAL LOW (ref 12.0–15.0)
Hemoglobin: 7.2 g/dL — ABNORMAL LOW (ref 12.0–15.0)
MCH: 26.6 pg (ref 26.0–34.0)
MCH: 26.7 pg (ref 26.0–34.0)
MCHC: 30.1 g/dL (ref 30.0–36.0)
MCHC: 30.4 g/dL (ref 30.0–36.0)
MCV: 88.2 fL (ref 78.0–100.0)
MCV: 87.8 fL (ref 78.0–100.0)
PLATELETS: 105 10*3/uL — AB (ref 150–400)
Platelets: 123 10*3/uL — ABNORMAL LOW (ref 150–400)
RBC: 2.71 MIL/uL — ABNORMAL LOW (ref 3.87–5.11)
RBC: 2.7 MIL/uL — ABNORMAL LOW (ref 3.87–5.11)
RDW: 15.6 % — AB (ref 11.5–15.5)
RDW: 15.1 % (ref 11.5–15.5)
WBC: 1.7 10*3/uL — AB (ref 4.0–10.5)
WBC: 1.1 10*3/uL — CL (ref 4.0–10.5)

## 2017-06-19 LAB — COMPREHENSIVE METABOLIC PANEL
ALT: 15 U/L (ref 14–54)
ANION GAP: 8 (ref 5–15)
AST: 22 U/L (ref 15–41)
Albumin: 2.5 g/dL — ABNORMAL LOW (ref 3.5–5.0)
Alkaline Phosphatase: 39 U/L (ref 38–126)
BILIRUBIN TOTAL: 1.3 mg/dL — AB (ref 0.3–1.2)
BUN: 15 mg/dL (ref 6–20)
CALCIUM: 8.9 mg/dL (ref 8.9–10.3)
CO2: 24 mmol/L (ref 22–32)
Chloride: 111 mmol/L (ref 101–111)
Creatinine, Ser: 0.89 mg/dL (ref 0.44–1.00)
GFR calc Af Amer: >60 mL/min (ref >60)
GLUCOSE: 99 mg/dL (ref 65–99)
Potassium: 4.1 mmol/L (ref 3.5–5.1)
Sodium: 143 mmol/L (ref 135–145)
TOTAL PROTEIN: 5.9 g/dL — AB (ref 6.5–8.1)

## 2017-06-19 LAB — IRON AND TIBC
IRON: 78 ug/dL (ref 28–170)
SATURATION RATIOS: 46 % — AB (ref 10.4–31.8)
TIBC: 168 ug/dL — ABNORMAL LOW (ref 250–450)
UIBC: 90 ug/dL

## 2017-06-19 LAB — VITAMIN B12: Vitamin B-12: 191 pg/mL (ref 180–914)

## 2017-06-19 LAB — DIFFERENTIAL
Basophils Absolute: 0 10*3/uL (ref 0.0–0.1)
Basophils Relative: 0 %
EOS PCT: 3 %
Eosinophils Absolute: 0 10*3/uL (ref 0.0–0.7)
Lymphocytes Relative: 73 %
Lymphs Abs: 0.9 10*3/uL (ref 0.7–4.0)
MONO ABS: 0 10*3/uL — AB (ref 0.1–1.0)
Monocytes Relative: 2 %
NEUTROS PCT: 22 %
Neutro Abs: 0.2 10*3/uL — ABNORMAL LOW (ref 1.7–7.7)

## 2017-06-19 LAB — HIV ANTIBODY (ROUTINE TESTING W REFLEX): HIV Screen 4th Generation wRfx: NONREACTIVE

## 2017-06-19 LAB — SAVE SMEAR

## 2017-06-19 LAB — PREPARE RBC (CROSSMATCH)

## 2017-06-19 LAB — FERRITIN: FERRITIN: 431 ng/mL — AB (ref 11–307)

## 2017-06-19 MED ORDER — DIPHENHYDRAMINE HCL 25 MG PO CAPS
25.0000 mg | ORAL_CAPSULE | Freq: Once | ORAL | Status: AC
  Administered 2017-06-19: 25 mg via ORAL
  Filled 2017-06-19: qty 1

## 2017-06-19 MED ORDER — FLUCONAZOLE 100 MG PO TABS
200.0000 mg | ORAL_TABLET | Freq: Every day | ORAL | Status: DC
  Administered 2017-06-19 – 2017-06-25 (×7): 200 mg via ORAL
  Filled 2017-06-19 (×7): qty 2

## 2017-06-19 MED ORDER — SODIUM CHLORIDE 0.9 % IV SOLN
Freq: Once | INTRAVENOUS | Status: AC
  Administered 2017-06-19: 21:00:00 via INTRAVENOUS

## 2017-06-19 MED ORDER — CIPROFLOXACIN IN D5W 400 MG/200ML IV SOLN
400.0000 mg | Freq: Two times a day (BID) | INTRAVENOUS | Status: DC
  Administered 2017-06-19 – 2017-06-28 (×19): 400 mg via INTRAVENOUS
  Filled 2017-06-19 (×20): qty 200

## 2017-06-19 MED ORDER — WHITE PETROLATUM EX OINT
TOPICAL_OINTMENT | CUTANEOUS | Status: AC
  Administered 2017-06-19: 06:00:00
  Filled 2017-06-19: qty 28.35

## 2017-06-19 MED ORDER — METRONIDAZOLE IN NACL 5-0.79 MG/ML-% IV SOLN
500.0000 mg | Freq: Four times a day (QID) | INTRAVENOUS | Status: DC
  Administered 2017-06-19 – 2017-06-28 (×35): 500 mg via INTRAVENOUS
  Filled 2017-06-19 (×37): qty 100

## 2017-06-19 MED ORDER — TBO-FILGRASTIM 480 MCG/0.8ML ~~LOC~~ SOSY
480.0000 ug | PREFILLED_SYRINGE | Freq: Once | SUBCUTANEOUS | Status: AC
  Administered 2017-06-19: 480 ug via SUBCUTANEOUS
  Filled 2017-06-19: qty 0.8

## 2017-06-19 MED ORDER — FOLIC ACID 1 MG PO TABS
2.0000 mg | ORAL_TABLET | Freq: Every day | ORAL | Status: DC
  Administered 2017-06-20 – 2017-07-02 (×13): 2 mg via ORAL
  Filled 2017-06-19 (×13): qty 2

## 2017-06-19 MED ORDER — FUROSEMIDE 10 MG/ML IJ SOLN
20.0000 mg | Freq: Once | INTRAMUSCULAR | Status: AC
  Administered 2017-06-20: 20 mg via INTRAVENOUS
  Filled 2017-06-19: qty 4

## 2017-06-19 MED ORDER — PNEUMOCOCCAL VAC POLYVALENT 25 MCG/0.5ML IJ INJ: 0.5000 mL | INJECTION | INTRAMUSCULAR | Status: DC | PRN

## 2017-06-19 MED ORDER — DEXTROSE 5 % IV SOLN
5.0000 mg/kg | Freq: Three times a day (TID) | INTRAVENOUS | Status: DC
  Administered 2017-06-20 – 2017-06-23 (×11): 400 mg via INTRAVENOUS
  Filled 2017-06-19 (×12): qty 8

## 2017-06-19 MED ORDER — ACETAMINOPHEN 325 MG PO TABS
650.0000 mg | ORAL_TABLET | Freq: Once | ORAL | Status: AC
  Administered 2017-06-19: 650 mg via ORAL
  Filled 2017-06-19: qty 2

## 2017-06-19 NOTE — Progress Notes (Signed)
  Interim progress note  PM CBC came back with WBC further reduced to 1.1 from 1.9 on admission. Patient was admitted for diverticulitis and is being treated with cipro/flagyl. She also has what appears to be a left lower leg cellulitis. She had low blood counts on admission and is on methotrexate at home for RA. She admitted to taking MTX daily instead of the prescribed once weekly.   Spoke with on-call hematology-oncology doctor. Will obtain peripheral smear, differential and absolute neutrophil count. Patient has had fever yesterday on admission to 100.4 however she also has diverticulitis and left leg cellulitis, for now will continue antibiotics that have been on for the above and get blood cultures x 2. Will treat w/ filgrastim. Heme-onc to see this weekend.  Lucila Maine, DO PGY-2, [facility] 06/19/2017 3:06 PM

## 2017-06-19 NOTE — Progress Notes (Signed)
FPTS Interim Progress Note  S:Paged to room due to patient "spitting up blood"  O: BP (!) 152/62 (BP Location: Right Arm)   Pulse 91   Temp 98.7 F (37.1 C) (Oral)   Resp 18   Ht _0  (1.6 m)   Wt 263 lb 7.2 oz (119.5 kg)   SpO2 98%   BMI 46.67 kg/m     A/P: Went to evaluate patient immediately.  She said her lip had split and she was dabbing her bleeing lip with a rag.  She was not spitting up blood.  We discussed her low white count and holding methotrexate temporarily.  She had no new pain, good BP/pulse ox/HR  RN will offer vaseline for lip  Sherene Sires, DO 06/19/2017, 4:17 AM PGY-1, [facility] Service pager [phone]

## 2017-06-19 NOTE — Consult Note (Signed)
[facility] Nurse ostomy consult note Stoma type/location: LLQ colostomy. Performed by Dr. Zella Richer in approximately 2005 (according to patient). Denudation in the peristomal field is the reason for this admission. Stomal assessment/size: 1 and 3/8 inches round, flush, stoma tips slightly into defect at 3 o'clock. Os at center Peristomal assessment: Severely denuded skin in the peristomal plane  Treatment options for stomal/peristomal skin: Severely denuded skin in the peristomal area measuring 4-inches from stoma at 3 o'clock, 6 o'clock and 9 o'clock treated with dusting of stoma powder and application of no sting skin barrier spray x2, fanning skin dry in-between treatments. A full skin barrier ring and a 1/2 skin barrier ring stretch and placed from 3-9 o'clock is applied at the border of the full ring.  Output: brown pasty stool Ostomy pouching: 1pc. Flat pouching system with skin barrier ring  Education provided: Patient re educated on pouch sizing as she had been cutting the aperture of the pouch too large (cutting it all the way out to 2 and 1/2 inches, exposing peristomal skin). Patient will require the services of an [facility] post discharge to continue ostomy support, teaching and monitoring of the peristomal skin, despite having had her ostomy for over 10 years.  Enrolled patient in [place] program: No.  Patient is established with a supplier.   [facility] nursing team will follow, and will remain available to this patient, the nursing and medical teams.  Please re-consult if needed. Thanks, Maudie Flakes, MSN, RN, [place], Arther Abbott  Pager# [phone]

## 2017-06-19 NOTE — Care Management Note (Signed)
Case Management Note  Patient Details  Name: [name] MRN: [id number] Date of Birth: [birth year]  Subjective/Objective:     Pt admitted with diverticulitis. She is from home alone.                Action/Plan: Plan is for patient to d/c home when medically stable. CM following for d/c needs, physician orders.  Expected Discharge Date:                  Expected Discharge Plan:  Home/Self Care  In-House Referral:     Discharge planning Services     Post Acute Care Choice:    Choice offered to:     DME Arranged:    DME Agency:     HH Arranged:    HH Agency:     Status of Service:  In process, will continue to follow  If discussed at Long Length of Stay Meetings, dates discussed:    Additional Comments:  Pollie Friar, RN 06/19/2017, 3:18 PM

## 2017-06-19 NOTE — Progress Notes (Signed)
Pt observed to have spat out bloody sputum, Dr on call paged and notified, came up to see pt, pt's lower lip was observed to be cracked which bled into her mouth, Vaseline ointment given to patient. Ostomy site cleaned and a new bag applied, pt reassured, will continue to monitor. [place], Leor Whyte Efe

## 2017-06-19 NOTE — Progress Notes (Signed)
Family Medicine Teaching Service Daily Progress Note Intern Pager: [phone]  Patient name: [name] record number: [id number] Date of birth: 09-09-1945 Age: 72 y.o. Gender: female  Primary Care Provider: Dixie Dials, MD Consultants: wound care Code Status: full  Pt Overview and Major Events to Date:  5/23 admitted 5/24 found to have methotrexate toxicity, transfused 1u RBC  Assessment and Plan: [name] is a 72 y.o. female presenting with abdominal pain and concern for stoma breakdown. PMH is significant for rheumatoid arthritis, colostomy, hypothyroidism, HTN, HLD, CAD, peripheral edema  Pantytopenia w/ fever (likely methotrexate toxicity:  Fever now managed on tylenol.  HTemp 100.4, afebrile overnight 5/24. S/p 1u transfusion WBC 1.8, Hgb 7.8, platelets 81.  Chronic anemia ~8-9, but thrombocytopenia and leukopenia are a new finding.  Patient with stomatitis but not in acute distress -HEME/ONc consulted, recs appreciated -methotrexate level pending, may need leukorin depending on level -erythropoetin lab ordered -started on filgastrim -started on acyclovir -started on diflucan -folic acid  Diverticulitis: Anemia likely from myelosuppression.  Diverticulitis confirmed on CT abd, patient with mild belly pain, s/p long term stoma.  Still producing stool, no vomiting.  Only mild belly pain on palpation.  Febrile to 100.4 on admission, controlled w/ tylenol.  Lactic acid 1.01. -GI consult in AM -IV Cipro (5/23- ) -IV metro (5/23- ) -clear liquid -FOBT ordered  Ventral Hernia: large,  no dilation of bowels on imaging, still producing stool per patient -no intervention indicated at this time  Stoma: mild excoriation, symptomatic per patient for ~1week. - Wound care in AM  assymetric left leg swelling/erythema: likely cellulitis, per patient present ~32month pulses present 2+ edema on right, 1+ on left.  Patient had thought this was gout but pain is most severe at  anterior shin and not at toe -on Cipro/metro/acyclovir/diflucan  Iron deficiency: on chronic ferrous sulfate 325, iron labs do not indicate acute deficiency -hold home iron in setting of infection  Rheumatoid arthritis -holding home MTX in light of pancytopenia -cont folate supplementation  Hypothyroidism - Continue home Synthroid  HLD - Continue home simvastatin  Chronic pain: on home norco 10 Q4prn and nsaids -cont home norco -tylenol prn -ibuprofen prn  FEN/GI: npo sips w/ meds Prophylaxis: lovenox  Disposition: med-surg for IV medication, will need support while waiting for marrow to recover  Subjective:  Feeling much more energy and less chills after her transfusion of 1u RBC.  Nervous about how sick she got with the methotrexate but comfortable with plan moving forward.  Objective: Temp:  [98.4 F (36.9 C)-100.2 F (37.9 C)] 98.9 F (37.2 C) (05/24 2114) Pulse Rate:  [84-93] 91 (05/24 2114) Resp:  [16-18] 18 (05/24 2114) BP: (116-165)/(50-76) 143/62 (05/24 2114) SpO2:  [98 %-99 %] 98 % (05/24 2114) Weight:  [266 lb 15.6 oz (121.1 kg)] 266 lb 15.6 oz (121.1 kg) (05/24 0411) Physical Exam: General: NAD, pleasant  Eyes:  EOMI, no conjunctival pallor or injection ENTM: Moist mucous membranes, bloody sores of mucous membrane Neck: Supple, no LAD Cardiovascular: RRR, no m/r/g, no LE edema Respiratory: CTA BL, normal work of breathing Gastrointestinal: soft, nontender, obese, normoactive BS, colostomy bag in place MSK: moves 4 extremities equally.  Improved redness/swelling of LLE Derm: no rashes appreciated Neuro: CN II-XII grossly intact Psych: AOx3, appropriate affect   Laboratory: Recent Labs  Lab 06/18/17 1837 06/19/17 0438 06/19/17 1328  WBC 1.9* 1.7* 1.1*  HGB 8.3* 7.2* 7.2*  HCT 26.9* 23.9* 23.7*  PLT 152 123* 105*  Recent Labs  Lab 06/18/17 1837 06/19/17 0438  NA 142 143  K 4.1 4.1  CL 111 111  CO2 23 24  BUN 19 15  CREATININE  1.02* 0.89  CALCIUM 9.4 8.9  PROT 6.7 5.9*  BILITOT 1.7* 1.3*  ALKPHOS 45 39  ALT 17 15  AST 25 22  GLUCOSE 104* 99     Imaging/Diagnostic Tests: Ct Abdomen Pelvis W Contrast  Result Date: 06/18/2017 CLINICAL DATA:  Abdominal pain and fever EXAM: CT ABDOMEN AND PELVIS WITH CONTRAST TECHNIQUE: Multidetector CT imaging of the abdomen and pelvis was performed using the standard protocol following bolus administration of intravenous contrast. CONTRAST:  164m OMNIPAQUE IOHEXOL 300 MG/ML  SOLN COMPARISON:  07/05/2009 FINDINGS: LOWER CHEST: Focal atelectasis at the right lung base. Coronary artery calcifications. HEPATOBILIARY: Normal hepatic contours and density. No intra- or extrahepatic biliary dilatation. Status post cholecystectomy. PANCREAS: Generalized pancreatic atrophy. SPLEEN: Normal. ADRENALS/URINARY TRACT: --Adrenal glands: Normal. --Right kidney/ureter: No hydronephrosis, nephroureterolithiasis, perinephric stranding or solid renal mass. --Left kidney/ureter: No hydronephrosis, nephroureterolithiasis, perinephric stranding or solid renal mass. --Urinary bladder: Nondistended. STOMACH/BOWEL: --Stomach/Duodenum: No hiatal hernia or other gastric abnormality. Normal duodenal course. --Small bowel: Large ventral abdominal hernia containing multiple loops of nondilated small bowel. There is a surgical anastomosis within the hernia. --Colon: A segment of the transverse colon is contained within the ventral abdominal hernia. There is diverticulosis of this segment with mild surrounding acute inflammation. Status post HJeanette Capriceprocedure with left lower quadrant colostomy. --Appendix: Not visualized. No right lower quadrant inflammation or free fluid. VASCULAR/LYMPHATIC: Atherosclerotic calcification is present within the non-aneurysmal abdominal aorta, without hemodynamically significant stenosis. The portal vein, splenic vein, superior mesenteric vein and IVC are patent. No abdominal or pelvic  lymphadenopathy. REPRODUCTIVE: Status post hysterectomy. No adnexal mass. MUSCULOSKELETAL. No bony spinal canal stenosis or focal osseous abnormality. OTHER: None. IMPRESSION: 1. Acute diverticulitis of a portion of the proximal colon contained within the ventral abdominal hernia. No abscess or free intraperitoneal air. 2. Large ventral abdominal hernia containing small bowel and colon without obstruction. 3. 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 No obstruction. 4.  Aortic Atherosclerosis (ICD10-I70.0). Electronically Signed   By: KUlyses JarredM.D.   On: 06/18/2017 20:17   Dg Chest Port 1 View  Result Date: 06/18/2017 CLINICAL DATA:  72year old female with pain and swelling at her colostomy stoma. EXAM: PORTABLE CHEST 1 VIEW COMPARISON:  Prior chest x-ray 10/22/2015 FINDINGS: The patient is rotated to the right which distorts the cardiac and mediastinal contours. Within these limits, no significant interval change. Atherosclerotic calcifications are present in the transverse aorta. Chronic elevation of the right hemidiaphragm with slightly increased right basilar atelectasis. No pulmonary edema, pleural effusion or pneumothorax. No suspicious nodule or mass. No acute osseous abnormality. IMPRESSION: Given rightward rotated position, no acute cardiopulmonary process. Electronically Signed   By: HJacqulynn CadetM.D.   On: 06/18/2017 19:12     BSherene Sires DO 06/19/2017, 10:50 PM PGY-1, CDrakesboroIntern pager: [phone] text pages welcome

## 2017-06-19 NOTE — Consult Note (Signed)
Referral MD  Reason for Referral: Pancytopenia secondary to methotrexate use  Chief Complaint  Patient presents with  . Stoma Breakdown  : I took methotrexate every day.  HPI: [name] is a very charming 72 year old African-American female.  She has rheumatoid arthritis.  She is had a for several years.  She has been on methotrexate for quite a while. she was taking it weekly.  However, she saw a new rheumatologist.  There apparently was some Ms. communication.  She started taking the methotrexate daily.    She began to have some abdominal pain.  She came to the hospital.  She was admitted on 23 May.  She is found to be pancytopenic.  On May 23, her white count is 1.9.  Hemoglobin 8.3.  Platelet count 152,000.  This afternoon, her white cell count is 1.1.  Hemoglobin 7.2.  Blood count 105,000.  MCV was 88.  She is on antibiotics.  I think cultures are all negative.  She has bad mucositis on her lower lips.  Looks like she has some candidal infection in the intertriginous area under her breast.  She has abdominal pain.  She had a CT scan done.  Looks like she may have diverticulitis.  Her pain seems to be over on the right side of her abdomen however.  Chemical studies look pretty good.  She had iron studies which do not show any obvious iron deficiency.  Her vitamin B12 level is mildly on the low range.  It is 191.  She is cold right now.  I suspect this probably is from her being quite anemic.  She been taking the methotrexate daily for several days.  I think she probably has been taking 12.5 mg daily.  She has had no blurred vision..  She does not have sickle cell disease.  She does not drink.  She does not smoke.  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  Overall, her performance status is ECOG 1.    Past Medical History:  Diagnosis Date  . Anemia    takes Ferrous Sulfate daily  . Arthritis   . Chronic bronchitis ([place])    Combivent if needed)  . Colostomy  care [facility]) 2004  . Coronary artery disease   . Diverticular disease   . Dizziness   . Dry eyes    uses Restasis daily  . H/O hiatal hernia   . History of blood transfusion    no abnormal reaction noted  . History of colon polyps    benign  . Hyperlipidemia    takes Zocor daily  . Hypertension    takes Metoprolol daily  . Hypothyroidism    takes Synthroid daily  . Nocturia    states only d/t being on Lasix  . Numbness and tingling    left foot  . Peripheral edema    takes Lasix daily  . Rheumatoid arthritis(714.0)    Methotrexate 2 times a week  . Shortness of breath    with exertion  :  Past Surgical History:  Procedure Laterality Date  . ABDOMINAL HYSTERECTOMY  1965  . CAPSULOTOMY Bilateral 06/01/2012   Procedure: MINOR CAPSULOTOMY;  Surgeon: Myrtha Mantis., MD;  Location: [place];  Service: Ophthalmology;  Laterality: Bilateral;  . CATARACT EXTRACTION W/PHACO  01/01/2011   Procedure: CATARACT EXTRACTION PHACO AND INTRAOCULAR LENS PLACEMENT (IOC);  Surgeon: [name], MD;  Location: [place];  Service: Ophthalmology;  Laterality: Left;  . CATARACT EXTRACTION W/PHACO  12/24/2011  Procedure: CATARACT EXTRACTION PHACO AND INTRAOCULAR LENS PLACEMENT (IOC);  Surgeon: [name], MD;  Location: [place];  Service: Ophthalmology;  Laterality: Right;  . COLON SURGERY  2002   d/t diverticulosis  . COLONOSCOPY    . COLOSTOMY    . ESOPHAGOGASTRODUODENOSCOPY    . EYE SURGERY    . KNEE ARTHROSCOPY     bilateral  . TONSILLECTOMY    . TOTAL KNEE ARTHROPLASTY  2005/2008   bilateral  . TOTAL KNEE REVISION Right 10/24/2015   Procedure: TOTAL KNEE REVISION;  Surgeon: [name], MD;  Location: [place];  Service: Orthopedics;  Laterality: Right;  . YAG LASER APPLICATION Bilateral 02/01/1094   Procedure: YAG LASER APPLICATION;  Surgeon: Myrtha Mantis., MD;  Location: [place];  Service: Ophthalmology;  Laterality: Bilateral;  :   Current Facility-Administered Medications:   .  0.9 %  sodium chloride infusion, , Intravenous, Once, Chancy Smigiel, Rudell Cobb, MD .  acetaminophen (TYLENOL) tablet 650 mg, 650 mg, Oral, Q6H PRN **OR** acetaminophen (TYLENOL) suppository 650 mg, 650 mg, Rectal, Q6H PRN, Bland, Scott, DO .  acetaminophen (TYLENOL) tablet 650 mg, 650 mg, Oral, Once, Yoniel Arkwright, Rudell Cobb, MD .  acyclovir (ZOVIRAX) 605 mg in dextrose 5 % 100 mL IVPB, 5 mg/kg, Intravenous, Q8H, Malakhai Beitler R, MD .  ciprofloxacin (CIPRO) IVPB 400 mg, 400 mg, Intravenous, Q12H, Bland, Scott, DO, Last Rate: 200 mL/hr at 06/19/17 1802, 400 mg at 06/19/17 1802 .  cycloSPORINE (RESTASIS) 0.05 % ophthalmic emulsion 1 drop, 1 drop, Both Eyes, BID, Bland, Scott, DO, 1 drop at 06/19/17 0904 .  diphenhydrAMINE (BENADRYL) capsule 25 mg, 25 mg, Oral, Once, Alesi Zachery, Rudell Cobb, MD .  enoxaparin (LOVENOX) injection 40 mg, 40 mg, Subcutaneous, Q24H, Bland, Scott, DO, 40 mg at 06/18/17 2239 .  fluconazole (DIFLUCAN) tablet 200 mg, 200 mg, Oral, Daily, Shonda Mandarino, Rudell Cobb, MD .  Derrill Memo ON 0/45/4098] folic acid (FOLVITE) tablet 2 mg, 2 mg, Oral, Daily, Gradie Butrick, Rudell Cobb, MD .  furosemide (LASIX) injection 20 mg, 20 mg, Intravenous, Once, Dynesha Woolen, Rudell Cobb, MD .  HYDROcodone-acetaminophen ([place]) 10-325 MG per tablet 1 tablet, 1 tablet, Oral, Q4H PRN, Sherene Sires, DO, 1 tablet at 06/18/17 2238 .  ibuprofen (ADVIL,MOTRIN) tablet 600 mg, 600 mg, Oral, Q6H PRN, Bland, Scott, DO .  levothyroxine (SYNTHROID, LEVOTHROID) tablet 50 mcg, 50 mcg, Oral, QAC breakfast, Bland, Scott, DO, 50 mcg at 06/19/17 0851 .  metroNIDAZOLE (FLAGYL) IVPB 500 mg, 500 mg, Intravenous, Q6H, Bland, Scott, DO, Stopped at 06/19/17 1646 .  pneumococcal 23 valent vaccine (PNU-IMMUNE) injection 0.5 mL, 0.5 mL, Intramuscular, Prior to discharge, Hensel, Jamal Collin, MD .  simvastatin (ZOCOR) tablet 40 mg, 40 mg, Oral, Daily, Bland, Scott, DO, 40 mg at 06/19/17 0904:  . acetaminophen  650 mg Oral Once  . cycloSPORINE  1 drop Both Eyes BID  .  diphenhydrAMINE  25 mg Oral Once  . enoxaparin (LOVENOX) injection  40 mg Subcutaneous Q24H  . fluconazole  200 mg Oral Daily  . [START ON 02/15/1476] folic acid  2 mg Oral Daily  . furosemide  20 mg Intravenous Once  . levothyroxine  50 mcg Oral QAC breakfast  . simvastatin  40 mg Oral Daily  :  No Known Allergies:  Family History  Problem Relation Age of Onset  . Anesthesia problems Neg Hx   . Hypotension Neg Hx   . Malignant hyperthermia Neg Hx   . Pseudochol deficiency Neg Hx   :  Social History  Socioeconomic History  . Marital status: Single    Spouse name: Not on file  . Number of children: Not on file  . Years of education: Not on file  . Highest education level: Not on file  Occupational History  . Not on file  Social Needs  . Financial resource strain: Not on file  . Food insecurity:    Worry: Not on file    Inability: Not on file  . Transportation needs:    Medical: Not on file    Non-medical: Not on file  Tobacco Use  . Smoking status: Former Smoker    Last attempt to quit: 10/22/2002    Years since quitting: 14.6  . Smokeless tobacco: Never Used  . Tobacco comment: quit smoking in 2004  Substance and Sexual Activity  . Alcohol use: No    Comment: quit drinking in 2004  . Drug use: No  . Sexual activity: Never    Birth control/protection: Surgical  Lifestyle  . Physical activity:    Days per week: Not on file    Minutes per session: Not on file  . Stress: Not on file  Relationships  . Social connections:    Talks on phone: Not on file    Gets together: Not on file    Attends religious service: Not on file    Active member of club or organization: Not on file    Attends meetings of clubs or organizations: Not on file    Relationship status: Not on file  . Intimate partner violence:    Fear of current or ex partner: Not on file    Emotionally abused: Not on file    Physically abused: Not on file    Forced sexual activity: Not on file  Other  Topics Concern  . Not on file  Social History Narrative  . Not on file  :  Pertinent items are noted in HPI.  Exam: Patient Vitals for the past 24 hrs:  BP Temp Temp src Pulse Resp SpO2 Height Weight  06/19/17 1545 116/76 98.4 F (36.9 C) Oral 89 18 98 % - -  06/19/17 0739 (!) 135/50 99.8 F (37.7 C) Oral 84 16 98 % - -  06/19/17 0411 (!) 152/62 98.7 F (37.1 C) Oral 91 18 98 % - 266 lb 15.6 oz (121.1 kg)  06/19/17 0000 (!) 152/55 100.2 F (37.9 C) Oral 88 18 99 % - -  06/18/17 2208 (!) 179/61 99.1 F (37.3 C) Oral 97 (!) 22 98 % _0  (1.6 m) 263 lb 7.2 oz (119.5 kg)  06/18/17 2146 - 99.6 F (37.6 C) Oral - - - - -  06/18/17 2100 (!) 156/89 - - - - - - -  06/18/17 2030 (!) 158/60 - - 90 - 96 % - -     Recent Labs    06/19/17 0438 06/19/17 1328  WBC 1.7* 1.1*  HGB 7.2* 7.2*  HCT 23.9* 23.7*  PLT 123* 105*   Recent Labs    06/18/17 1837 06/19/17 0438  NA 142 143  K 4.1 4.1  CL 111 111  CO2 23 24  GLUCOSE 104* 99  BUN 19 15  CREATININE 1.02* 0.89  CALCIUM 9.4 8.9    Blood smear review: Normochromic and normocytic population of red blood cells.  There may be some slight anisocytosis.  I see no nucleated red blood cells.  She has no teardrop cells.  She has no inclusion bodies.  There is no rouleaux formation.  White cells are decreased in number.  She has no hypersegmented polys.  I see no immature myeloid or lymphoid forms.  I see no blasts.  Platelets are mildly decreased in number.  Platelets are well granulated.  He has a couple large platelets.   Pathology: None     Assessment and Plan: [name] is a very nice 72 year old African-American female.  She has rheumatoid arthritis.  She mistakenly took her methotrexate for several days in a row.  She has what appears to be drug-induced pancytopenia.  Her blood smear is quite reassuring.  I do not see anything on her blood smear that looks suspicious for a hematologic malignancy.  It might take a while for  the methotrexate to really get out of her system and for her bone marrow to start working again.  It looks like she has been on methotrexate for quite a while.  It will be interesting to see what her reticulocyte count is.  I would think that it would be quite low.  I do think that she is symptomatic from anemia.  She is quite cold.  I do not think she is septic.  She is not shivering nor that she has rigors a blood transfusion.  I explained how a blood transfusion is given.  I told her that it is safe to give blood now..  I talked to her about patients do not get HIV or hepatitis from transfusions.  I think 1 unit would be appropriate for her to make her feel better.  She is agreeable.  I would do a transfusion tomorrow.  I would see what her erythropoietin level is.  She may benefit from ESA if she has a low erythropoietin level.  I clearly would give her colony-stimulating factor for her white cells.  I think this would definitely be helpful.  She has mucositis on her lower lip.  I will start her on some antiviral agent.  I will also would put her on antifungal coverage while she is neutropenic.  The pain in her right abdomen might be typhlitis although the CT scan so I did not show anything that was obvious.  All in all, the bone marrow should recover from the methotrexate dosing.  It may take several days before this happens.  She does has to be supported through this pancytopenia.  I agree with the antibiotic coverage that she is on.  I think this is very reasonable.  We will follow along and help out as necessary.  I spent about 45 minutes with her today.  All the time was spent face-to-face with her.  Lattie Haw, MD  Michaelyn Barter 2:10

## 2017-06-19 NOTE — Progress Notes (Addendum)
Family Medicine Teaching Service Daily Progress Note Intern Pager: [phone]  Patient name: [name] record number: [id number] Date of birth: 1945-10-15 Age: 72 y.o. Gender: female  Primary Care Provider: Dixie Dials, MD Consultants: wound care Code Status: full  Pt Overview and Major Events to Date:  5/23 admitted  Assessment and Plan: [name] is a 72 y.o. female presenting with abdominal pain and concern for stoma breakdown. PMH is significant for rheumatoid arthritis, colostomy, hypothyroidism, HTN, HLD, CAD, peripheral edema  Diverticulitis: confirmed on CT abd, patient with mild belly pain, s/p long term stoma.  Still producing stool, no vomiting.  Only mild belly pain on palpation.  Febrile to 100.4 on admission, controlled w/ tylenol.  Lactic acid 1.01. -GI consult in AM -IV Cipro (5/23- ) -IV metro (5/23- ) -npo sips with meds -s/p NS 125/hr IVF overnight, now d/cing -will consult GI if anemia worsens out of concern for potential GI bleed   Pantytopenia w/ fever (likely methotrexate toxicity: PATIENT WAS TAKING METHOTREXATE DAILY INSTEAD OF WEEKLY.  Fever now managed on tylenol.  HTemp 100.4.  WBC 1.7 on admission, Hgb 7.2, RBC 2.71, platelets 123.  Chronic anemia ~8-9, but thrombocytopenia and leukopenia are a new finding.   -concern for methotrexate toxicity, will hold pending AM rounds discussion -Patient was clinically well appearing but will discuss transfusion threshold -type and screen ordered -methotrexate level ordered stat, may need leukorin depending on level  Ventral Hernia: large,  no dilation of bowels on imaging, still producing stool per patient -no intervention indicated at this time  Stoma: mild excoriation, symptomatic per patient for ~1week. - Wound care in AM  assymetric right leg swelling/erythema: likely cellulitis, per patient present ~101month pulses present 2+ edema on right, 1+ on left.  Patient had thought this was gout but  pain is most severe at anterior shin and not at toe -on Cipro/metro  Iron deficiency: on chronic ferrous sulfate 325 -cont home ferrous sulfate  Rheumatoid arthritis -holding home MTX in light of pancytopenia -cont folate supplementation  Hypothyroidism - Continue home Synthroid  HLD - Continue home simvastatin  Chronic pain: on home norco 10 Q4prn and nsaids -cont home norco -tylenol prn -ibuprofen prn  FEN/GI: npo sips w/ meds Prophylaxis: lovenox  Disposition: med-surg for IV medication  Subjective:  Unchanged from admission, mild discomfort in abdomen.  Most concerned with irritation around stoma  Objective: Temp:  [98.7 F (37.1 C)-100.4 F (38 C)] 98.7 F (37.1 C) (05/24 0411) Pulse Rate:  [88-97] 91 (05/24 0411) Resp:  [17-22] 18 (05/24 0411) BP: (93-179)/(55-89) 152/62 (05/24 0411) SpO2:  [96 %-100 %] 98 % (05/24 0411) Weight:  [263 lb 7.2 oz (119.5 kg)] 263 lb 7.2 oz (119.5 kg) (05/23 2208) Physical Exam: General: obese, NAD but uncomfortable, pel[place] ENT: no pain but has "lost her voice" Cardiovascular: RRR, 2/6 murmur, assymetric 2+edema on left LE and 1+ on right Respiratory: CTAB, no crackles Abdomen: excoriated stoma w/ dressing in place, large panus with ventral hernia difficult to differentiate due to size of hernia and body habitus, tender to palpation Extremities: LLE with 2+ edema and assymetric warmth/redness  Laboratory: Recent Labs  Lab 06/18/17 1837  WBC 1.9*  HGB 8.3*  HCT 26.9*  PLT 152   Recent Labs  Lab 06/18/17 1837  NA 142  K 4.1  CL 111  CO2 23  BUN 19  CREATININE 1.02*  CALCIUM 9.4  PROT 6.7  BILITOT 1.7*  ALKPHOS 45  ALT 17  AST 25  GLUCOSE 104*     Imaging/Diagnostic Tests: Ct Abdomen Pelvis W Contrast  Result Date: 06/18/2017 CLINICAL DATA:  Abdominal pain and fever EXAM: CT ABDOMEN AND PELVIS WITH CONTRAST TECHNIQUE: Multidetector CT imaging of the abdomen and pelvis was performed using the  standard protocol following bolus administration of intravenous contrast. CONTRAST:  133m OMNIPAQUE IOHEXOL 300 MG/ML  SOLN COMPARISON:  07/05/2009 FINDINGS: LOWER CHEST: Focal atelectasis at the right lung base. Coronary artery calcifications. HEPATOBILIARY: Normal hepatic contours and density. No intra- or extrahepatic biliary dilatation. Status post cholecystectomy. PANCREAS: Generalized pancreatic atrophy. SPLEEN: Normal. ADRENALS/URINARY TRACT: --Adrenal glands: Normal. --Right kidney/ureter: No hydronephrosis, nephroureterolithiasis, perinephric stranding or solid renal mass. --Left kidney/ureter: No hydronephrosis, nephroureterolithiasis, perinephric stranding or solid renal mass. --Urinary bladder: Nondistended. STOMACH/BOWEL: --Stomach/Duodenum: No hiatal hernia or other gastric abnormality. Normal duodenal course. --Small bowel: Large ventral abdominal hernia containing multiple loops of nondilated small bowel. There is a surgical anastomosis within the hernia. --Colon: A segment of the transverse colon is contained within the ventral abdominal hernia. There is diverticulosis of this segment with mild surrounding acute inflammation. Status post HJeanette Capriceprocedure with left lower quadrant colostomy. --Appendix: Not visualized. No right lower quadrant inflammation or free fluid. VASCULAR/LYMPHATIC: Atherosclerotic calcification is present within the non-aneurysmal abdominal aorta, without hemodynamically significant stenosis. The portal vein, splenic vein, superior mesenteric vein and IVC are patent. No abdominal or pelvic lymphadenopathy. REPRODUCTIVE: Status post hysterectomy. No adnexal mass. MUSCULOSKELETAL. No bony spinal canal stenosis or focal osseous abnormality. OTHER: None. IMPRESSION: 1. Acute diverticulitis of a portion of the proximal colon contained within the ventral abdominal hernia. No abscess or free intraperitoneal air. 2. Large ventral abdominal hernia containing small bowel and colon  without obstruction. 3. 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 No obstruction. 4.  Aortic Atherosclerosis (ICD10-I70.0). Electronically Signed   By: KUlyses JarredM.D.   On: 06/18/2017 20:17   Dg Chest Port 1 View  Result Date: 06/18/2017 CLINICAL DATA:  72year old female with pain and swelling at her colostomy stoma. EXAM: PORTABLE CHEST 1 VIEW COMPARISON:  Prior chest x-ray 10/22/2015 FINDINGS: The patient is rotated to the right which distorts the cardiac and mediastinal contours. Within these limits, no significant interval change. Atherosclerotic calcifications are present in the transverse aorta. Chronic elevation of the right hemidiaphragm with slightly increased right basilar atelectasis. No pulmonary edema, pleural effusion or pneumothorax. No suspicious nodule or mass. No acute osseous abnormality. IMPRESSION: Given rightward rotated position, no acute cardiopulmonary process. Electronically Signed   By: HJacqulynn CadetM.D.   On: 06/18/2017 19:12     BSherene Sires DO 06/19/2017, 5:06 AM PGY-1, [place] pager: [phone] text pages welcome

## 2017-06-19 NOTE — Plan of Care (Signed)
Ms. Harnack presents to [place] with abdominal pain secondary to possible infection and denuded/ulcerated peristoma tissue.  The ostomy nurse came today, cleaned the area and refitted the appliance.  She is being treated with IV antibiotics and immunity stimulation.  Our plan of care is to ensure timely delivery of ordered antibiotics, pain management, progressive mobility and protection of skin integrity.

## 2017-06-19 NOTE — Progress Notes (Signed)
Critical lab WBC 1.1 reported by lab. MD notified. Wendee Copp

## 2017-06-20 DIAGNOSIS — D704 Cyclic neutropenia: Secondary | ICD-10-CM

## 2017-06-20 DIAGNOSIS — K5792 Diverticulitis of intestine, part unspecified, without perforation or abscess without bleeding: Secondary | ICD-10-CM | POA: Diagnosis not present

## 2017-06-20 LAB — CBC
HEMATOCRIT: 24.8 % — AB (ref 36.0–46.0)
HEMOGLOBIN: 7.8 g/dL — AB (ref 12.0–15.0)
MCH: 27.8 pg (ref 26.0–34.0)
MCHC: 31.5 g/dL (ref 30.0–36.0)
MCV: 88.3 fL (ref 78.0–100.0)
Platelets: 81 10*3/uL — ABNORMAL LOW (ref 150–400)
RBC: 2.81 MIL/uL — ABNORMAL LOW (ref 3.87–5.11)
RDW: 14.6 % (ref 11.5–15.5)
WBC: 1.8 10*3/uL — AB (ref 4.0–10.5)

## 2017-06-20 LAB — BPAM RBC
Blood Product Expiration Date: 201906142359
ISSUE DATE / TIME: 201905242052
Unit Type and Rh: 7300

## 2017-06-20 LAB — BASIC METABOLIC PANEL
ANION GAP: 8 (ref 5–15)
BUN: 9 mg/dL (ref 6–20)
CO2: 25 mmol/L (ref 22–32)
Calcium: 8.7 mg/dL — ABNORMAL LOW (ref 8.9–10.3)
Chloride: 105 mmol/L (ref 101–111)
Creatinine, Ser: 0.93 mg/dL (ref 0.44–1.00)
GFR calc Af Amer: >60 mL/min (ref >60)
GFR calc non Af Amer: >60 mL/min (ref >60)
GLUCOSE: 114 mg/dL — AB (ref 65–99)
POTASSIUM: 3.5 mmol/L (ref 3.5–5.1)
Sodium: 138 mmol/L (ref 135–145)

## 2017-06-20 LAB — TYPE AND SCREEN
ABO/RH(D): B POS
ANTIBODY SCREEN: NEGATIVE
Unit division: 0

## 2017-06-20 LAB — RETICULOCYTES
RBC.: 2.89 MIL/uL — ABNORMAL LOW (ref 3.87–5.11)
Retic Ct Pct: <0.4 % — ABNORMAL LOW (ref 0.4–3.1)

## 2017-06-20 NOTE — Plan of Care (Signed)
Mrs. [name] has been in considerably more pain today.  The wound under her ostomy bag is being complicated by the adhesive required for the appliance to stay on.  He is in no other apparent distress, but our plan of care continues to be pain management and infection prevention/treatment

## 2017-06-21 DIAGNOSIS — K5792 Diverticulitis of intestine, part unspecified, without perforation or abscess without bleeding: Secondary | ICD-10-CM | POA: Diagnosis not present

## 2017-06-21 DIAGNOSIS — T451X1A Poisoning by antineoplastic and immunosuppressive drugs, accidental (unintentional), initial encounter: Secondary | ICD-10-CM | POA: Diagnosis not present

## 2017-06-21 DIAGNOSIS — D61818 Other pancytopenia: Secondary | ICD-10-CM

## 2017-06-21 LAB — BASIC METABOLIC PANEL
Anion gap: 6 (ref 5–15)
BUN: 6 mg/dL (ref 6–20)
CHLORIDE: 106 mmol/L (ref 101–111)
CO2: 26 mmol/L (ref 22–32)
CREATININE: 0.87 mg/dL (ref 0.44–1.00)
Calcium: 8.5 mg/dL — ABNORMAL LOW (ref 8.9–10.3)
GFR calc Af Amer: >60 mL/min (ref >60)
GFR calc non Af Amer: >60 mL/min (ref >60)
GLUCOSE: 89 mg/dL (ref 65–99)
POTASSIUM: 3.6 mmol/L (ref 3.5–5.1)
Sodium: 138 mmol/L (ref 135–145)

## 2017-06-21 LAB — CBC
HCT: 27 % — ABNORMAL LOW (ref 36.0–46.0)
HEMATOCRIT: 25.3 % — AB (ref 36.0–46.0)
HEMOGLOBIN: 8 g/dL — AB (ref 12.0–15.0)
HEMOGLOBIN: 8.4 g/dL — AB (ref 12.0–15.0)
MCH: 27.7 pg (ref 26.0–34.0)
MCH: 27 pg (ref 26.0–34.0)
MCHC: 31.6 g/dL (ref 30.0–36.0)
MCHC: 31.1 g/dL (ref 30.0–36.0)
MCV: 87.5 fL (ref 78.0–100.0)
MCV: 86.8 fL (ref 78.0–100.0)
PLATELETS: 60 10*3/uL — AB (ref 150–400)
Platelets: 66 10*3/uL — ABNORMAL LOW (ref 150–400)
RBC: 2.89 MIL/uL — AB (ref 3.87–5.11)
RBC: 3.11 MIL/uL — AB (ref 3.87–5.11)
RDW: 14.7 % (ref 11.5–15.5)
RDW: 14.6 % (ref 11.5–15.5)
WBC: 1.4 10*3/uL — AB (ref 4.0–10.5)
WBC: 1.7 10*3/uL — AB (ref 4.0–10.5)

## 2017-06-21 LAB — RETICULOCYTES: RBC.: 2.89 MIL/uL — ABNORMAL LOW (ref 3.87–5.11)

## 2017-06-21 MED ORDER — MAGIC MOUTHWASH W/LIDOCAINE
2.0000 mL | Freq: Four times a day (QID) | ORAL | Status: DC | PRN
  Administered 2017-06-22: 2 mL via ORAL
  Filled 2017-06-21: qty 5

## 2017-06-21 NOTE — Progress Notes (Signed)
Family Medicine Teaching Service Daily Progress Note Intern Pager: [phone]  Patient name: [name] record number: [id number] Date of birth: 1945/06/26 Age: 72 y.o. Gender: female  Primary Care Provider: Dixie Dials, MD Consultants: wound care Code Status: full  Pt Overview and Major Events to Date:  5/23 admitted 5/24 found to have methotrexate toxicity, transfused 1u RBC  Assessment and Plan: [name] is a 72 y.o. female presenting with abdominal pain and concern for stoma breakdown. PMH is significant for rheumatoid arthritis, colostomy, hypothyroidism, HTN, HLD, CAD, peripheral edema  Pantytopenia w/ fever (likely methotrexate toxicity: afebrile overnight 5/25.  HTemp 100.4. S/p 1u transfusion WBC 1.4, Hgb 8.0, platelets 66.  Retic % still <0.04.  Chronic anemia ~8-9, but thrombocytopenia and leukopenia are a new finding.  s/p filgastrim.  Patient with stomatitis but not in acute distress -HEME/ONc consulted, recs appreciated -methotrexate level pending, may need leukorin depending on level -erythropoetin lab ordered -continue acyclovir (5/25- ) -continue diflucan (5/25- ) -continue cipro (5/24- ) -continue metro (5/32- ) -folic acid -pm CBC check, will decrease lovenox at <50 platelets, d/c at <25.  Daily cbcs starting tommorow  Diverticulitis: Anemia unlikely form GI bleed, most likely from myelosuppression.  Diverticulitis confirmed on CT abd, patient with mild belly pain, s/p long term stoma.  Still producing stool, no vomiting.  Only mild belly pain on palpation.  -IV Cipro (5/23- ) -IV metro (5/23- ) -soft diet 5/25, will discuss advancing to regular  Ventral Hernia: large,  no dilation of bowels on imaging, still producing stool per patient -no intervention indicated at this time  Stoma: worsening pain at site per wound care team, tape is irritating skin lesions - managed per Wound care  assymetric left leg swelling/erythema: improving, was  likely cellulitis, pulses present 1+ edema bilaterally.   -on Cipro/metro/acyclovir/diflucan  Iron deficiency: on chronic ferrous sulfate 325, iron labs do not indicate acute deficiency -hold home iron in setting of infection  Rheumatoid arthritis -holding home MTX in light of pancytopenia -cont folate supplementation  Hypothyroidism - Continue home Synthroid  HLD - Continue home simvastatin  Chronic pain: on home norco 10 Q4prn and nsaids -cont home norco -tylenol prn -ibuprofen prn  FEN/GI: on soft diet, will discuss advancing to regular Prophylaxis: lovenox  Disposition: med-surg, will need support for at least a few more days while waiting for marrow to recover  Subjective:  Irritation with stomatitis and skin breakdown around stoma is now most pressing physical symptom for patient.  Objective: Temp:  [98.3 F (36.8 C)-100.2 F (37.9 C)] 100.2 F (37.9 C) (05/26 0302) Pulse Rate:  [79-91] 87 (05/26 0302) Resp:  [16-21] 21 (05/26 0302) BP: (124-159)/(59-110) 157/68 (05/26 0302) SpO2:  [95 %-100 %] 95 % (05/26 0302) Weight:  [267 lb 6.7 oz (121.3 kg)] 267 lb 6.7 oz (121.3 kg) (05/26 0100) Physical Exam: General: uncfortable with stomatitis, NAD, pleasant ENTM: scabbing stomatitis, MMM Cardiovascular: RRR, no ptting edema  Respiratory: CTAB, good air movement throughout, no IWB Gastrointestinal: soft, belly only tender near stoma MSK: moves 4 extremities equally. Continued improvement to redness/swelling of LLE Neuro: grossly intact, no deficits ntoed Psych: AO, appropriate processing   Laboratory: Recent Labs  Lab 06/19/17 0438 06/19/17 1328 06/20/17 0608  WBC 1.7* 1.1* 1.8*  HGB 7.2* 7.2* 7.8*  HCT 23.9* 23.7* 24.8*  PLT 123* 105* 81*   Recent Labs  Lab 06/18/17 1837 06/19/17 0438 06/20/17 0608  NA 142 143 138  K 4.1 4.1 3.5  CL  111 111 105  CO2 _0 BUN _1 CREATININE 1.02* 0.89 0.93  CALCIUM 9.4 8.9 8.7*  PROT 6.7 5.9*  --    BILITOT 1.7* 1.3*  --   ALKPHOS 45 39  --   ALT 17 15  --   AST 25 22  --   GLUCOSE 104* 99 114*     Imaging/Diagnostic Tests: Ct Abdomen Pelvis W Contrast  Result Date: 06/18/2017 CLINICAL DATA:  Abdominal pain and fever EXAM: CT ABDOMEN AND PELVIS WITH CONTRAST TECHNIQUE: Multidetector CT imaging of the abdomen and pelvis was performed using the standard protocol following bolus administration of intravenous contrast. CONTRAST:  134m OMNIPAQUE IOHEXOL 300 MG/ML  SOLN COMPARISON:  07/05/2009 FINDINGS: LOWER CHEST: Focal atelectasis at the right lung base. Coronary artery calcifications. HEPATOBILIARY: Normal hepatic contours and density. No intra- or extrahepatic biliary dilatation. Status post cholecystectomy. PANCREAS: Generalized pancreatic atrophy. SPLEEN: Normal. ADRENALS/URINARY TRACT: --Adrenal glands: Normal. --Right kidney/ureter: No hydronephrosis, nephroureterolithiasis, perinephric stranding or solid renal mass. --Left kidney/ureter: No hydronephrosis, nephroureterolithiasis, perinephric stranding or solid renal mass. --Urinary bladder: Nondistended. STOMACH/BOWEL: --Stomach/Duodenum: No hiatal hernia or other gastric abnormality. Normal duodenal course. --Small bowel: Large ventral abdominal hernia containing multiple loops of nondilated small bowel. There is a surgical anastomosis within the hernia. --Colon: A segment of the transverse colon is contained within the ventral abdominal hernia. There is diverticulosis of this segment with mild surrounding acute inflammation. Status post HJeanette Capriceprocedure with left lower quadrant colostomy. --Appendix: Not visualized. No right lower quadrant inflammation or free fluid. VASCULAR/LYMPHATIC: Atherosclerotic calcification is present within the non-aneurysmal abdominal aorta, without hemodynamically significant stenosis. The portal vein, splenic vein, superior mesenteric vein and IVC are patent. No abdominal or pelvic lymphadenopathy.  REPRODUCTIVE: Status post hysterectomy. No adnexal mass. MUSCULOSKELETAL. No bony spinal canal stenosis or focal osseous abnormality. OTHER: None. IMPRESSION: 1. Acute diverticulitis of a portion of the proximal colon contained within the ventral abdominal hernia. No abscess or free intraperitoneal air. 2. Large ventral abdominal hernia containing small bowel and colon without obstruction. 3. 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 No obstruction. 4.  Aortic Atherosclerosis (ICD10-I70.0). Electronically Signed   By: KUlyses JarredM.D.   On: 06/18/2017 20:17   Dg Chest Port 1 View  Result Date: 06/18/2017 CLINICAL DATA:  72year old female with pain and swelling at her colostomy stoma. EXAM: PORTABLE CHEST 1 VIEW COMPARISON:  Prior chest x-ray 10/22/2015 FINDINGS: The patient is rotated to the right which distorts the cardiac and mediastinal contours. Within these limits, no significant interval change. Atherosclerotic calcifications are present in the transverse aorta. Chronic elevation of the right hemidiaphragm with slightly increased right basilar atelectasis. No pulmonary edema, pleural effusion or pneumothorax. No suspicious nodule or mass. No acute osseous abnormality. IMPRESSION: Given rightward rotated position, no acute cardiopulmonary process. Electronically Signed   By: HJacqulynn CadetM.D.   On: 06/18/2017 19:12     BSherene Sires DO 06/21/2017, 5:12 AM PGY-1, CCountry Club HillsIntern pager: [phone] text pages welcome

## 2017-06-21 NOTE — Plan of Care (Signed)
[name] has been cooperative and polite today.  She needs education with return demonstration on stoma care and peristomal care.  Until her MASD heals well enough to discharge, we will continue to treat her pain and encourage IV antibiotic therapy with progressive mobility.

## 2017-06-22 DIAGNOSIS — K9409 Other complications of colostomy: Secondary | ICD-10-CM | POA: Diagnosis not present

## 2017-06-22 DIAGNOSIS — M069 Rheumatoid arthritis, unspecified: Secondary | ICD-10-CM | POA: Diagnosis not present

## 2017-06-22 DIAGNOSIS — K1230 Oral mucositis (ulcerative), unspecified: Secondary | ICD-10-CM | POA: Diagnosis not present

## 2017-06-22 DIAGNOSIS — D61810 Antineoplastic chemotherapy induced pancytopenia: Secondary | ICD-10-CM | POA: Diagnosis not present

## 2017-06-22 LAB — GLUCOSE, CAPILLARY: GLUCOSE-CAPILLARY: 107 mg/dL — AB (ref 65–99)

## 2017-06-22 LAB — CBC
HCT: 25.3 % — ABNORMAL LOW (ref 36.0–46.0)
HEMOGLOBIN: 7.9 g/dL — AB (ref 12.0–15.0)
MCH: 27.1 pg (ref 26.0–34.0)
MCHC: 31.2 g/dL (ref 30.0–36.0)
MCV: 86.6 fL (ref 78.0–100.0)
PLATELETS: 47 10*3/uL — AB (ref 150–400)
RBC: 2.92 MIL/uL — ABNORMAL LOW (ref 3.87–5.11)
RDW: 14.6 % (ref 11.5–15.5)
WBC: 1.1 10*3/uL — CL (ref 4.0–10.5)

## 2017-06-22 LAB — BASIC METABOLIC PANEL
ANION GAP: 9 (ref 5–15)
BUN: 8 mg/dL (ref 6–20)
CHLORIDE: 104 mmol/L (ref 101–111)
CO2: 25 mmol/L (ref 22–32)
Calcium: 8.4 mg/dL — ABNORMAL LOW (ref 8.9–10.3)
Creatinine, Ser: 0.93 mg/dL (ref 0.44–1.00)
GFR calc non Af Amer: >60 mL/min (ref >60)
Glucose, Bld: 94 mg/dL (ref 65–99)
Potassium: 3.4 mmol/L — ABNORMAL LOW (ref 3.5–5.1)
SODIUM: 138 mmol/L (ref 135–145)

## 2017-06-22 LAB — RETICULOCYTES: RBC.: 2.92 MIL/uL — AB (ref 3.87–5.11)

## 2017-06-22 MED ORDER — FILGRASTIM 480 MCG/1.6ML IJ SOLN
480.0000 ug | Freq: Every day | INTRAMUSCULAR | Status: DC
  Administered 2017-06-22 – 2017-06-23 (×2): 480 ug via SUBCUTANEOUS
  Filled 2017-06-22 (×3): qty 1.6

## 2017-06-22 NOTE — Progress Notes (Signed)
Family Medicine Teaching Service Daily Progress Note Intern Pager: [phone]  Patient name: [name] record number: [id number] Date of birth: [birth year] Age: 72 y.o. Gender: female  Primary Care Provider: Dixie Dials, MD Consultants: Hemonc, Wound care Code Status: Full  Pt Overview and Major Events to Date:  5/23 Admitted for fever and pancytopenia found to have diverticulitis on CT abd/pelvis 5/24 Found to have methotrexate toxicity, transfused 1u RBC 5/27 Initiated Neupogen per Dr. Marin Olp recs (Hemonc)  Assessment and Plan: [name] is a 72 y.o. female presenting with diverticulitis and pancytopenia secondary to methotrexate toxicity.PMH is significant for rheumatoid arthritis, h/o chronic diverticulitis with colostomy, hypothyroidism, HTN, HLD, CAD, peripheral edema.  1.  Pancytopenia  Methotrexate toxicity: Acute.  Secondary to methotrexate use daily despite instructions to take every Friday only.  Intoxication does not seem to be intentional as patient believed she was supposed to be taking this daily since switching rheumatologist.  Dr. Marin Olp with hematology-oncology following.  Recommending Neupogen given absent response with reticulocytes at present. - Hematology-oncology consulted, appreciate recommendations - Initiating Neupogen - Daily folic acid supplementation - Daily CBC with differential - Pending erythropoietin and methotrexate level  2.  Acute on chronic diverticulitis  Fever  Abdominal pain:  Remains afebrile on antibiotics.  Confirmed acute diverticulitis on CT abdomen/pelvis upon arrival.  Stoma site appears to be intact without signs of infection.  Patient continues to produce stool without vomiting.  Abdominal pain seems to be stable.  No signs of acute abdomen. - Continue IV Cipro and Flagyl (5/23-) - Soft diet, ADAT  3.  Ventral Hernia: Chronic.  Large without dilation of GI tract or signs of obstruction. - Continue monitoring bowel  movements daily  4.  Stomatitis with ulceration:  Acute. Secondary to methotrexate toxicity. - Wound care consulted, appreciate recommendations - Acyclovir (5/25-) - Continue oral hygiene  5.  Left lower extremity erythema: Improving.  Suspicious for cellulitis would be related to chronic venous stasis though less likely.  To be spotting with current antibiotic treatment.  DVT unlikely given localization to ankle only. - Continue monitoring and antibiotics per above, problem #2  6.  Anemia of chronic inflammation:  On iron tablets at home.  Iron studies support adequate iron stores and high saturation ratio.  Ferritin elevated 431. - Holding home ferrous sulfate 325 mg daily - Pending erythropoietin level  7.  Rheumatoid arthritis: Chronic.  Stable.  Recently switched rheumatologist and patient is on methotrexate.  Patient presenting with toxicity due to taking daily methotrexate. - Holding home methotrexate 2.5 mg every Friday in light of toxicity, see plan #1 above - Continue folate supplementation  8.  Hypothyroidism: Chronic.  On Synthroid at home. - Continue home Synthroid 50 mcg daily  9.  Hyperlipidemia: Chronic.  On simvastatin and daily aspirin at home. - Continue home simvastatin 40 mg daily - Holding home aspirin 325 mg daily due to pancytopenia and diverticulitis  10.  Chronic pain:  Medications include Norco 10-325 every 4 hours as needed and NSAIDs. - Tylenol 650 g every 6 hours as needed for mild pain, ibuprofen 600 mg every 6 hours as needed for moderate pain - Continue Norco 10-325 tablet every 4 hours as needed - Holding home naproxen 440 mg twice daily as needed  FEN/GI: Soft diet, ADAT Prophylaxis: SCD d/t pancytopenia  Disposition:  Pending improvement of pancytopenia following initiation of Neupogen.  Continue treating diverticulitis with Cipro and Flagyl.  Subjective:  Patient complains of left shoulder pain  which is chronic but otherwise denies  symptoms of chest pain, shortness of breath, nausea or vomiting.  Her abdominal pain is improved.  She still has some pain with palpation to her left lower extremity.  Objective: Temp:  [97.4 F (36.3 C)-98.4 F (36.9 C)] 98.2 F (36.8 C) (05/26 2316) Pulse Rate:  [78-87] 87 (05/26 2316) Resp:  [18-20] 18 (05/26 2316) BP: (113-145)/(53-59) 130/53 (05/26 2316) SpO2:  [96 %-100 %] 96 % (05/26 2316) Weight:  [265 lb 6.9 oz (120.4 kg)] 265 lb 6.9 oz (120.4 kg) (05/26 2316) Physical Exam: General: pleasantly lying in bed, well developed, NAD HEENT: normocephalic, atraumatic, moist mucous membranes with diffuse ulceration of stoma Neck: supple, non-tender without lymphadenopathy Cardiovascular: regular rate and rhythm with 2/6 systolic murmur Lungs: clear to auscultation bilaterally with normal work of breathing on room air Abdomen: soft, non-tender, non-distended, normoactive bowel sounds with intact LLQ colostomy Skin: warm, dry, no rashes or lesions, cap refill < 2 seconds Extremities: warm and well perfused, normal tone, no edema Neuro: grossly moves all 4 extremities, no dysarthria Psych: A&Ox3, appropriate affect  06/22/2017   06/18/2017     Laboratory: Recent Labs  Lab 06/21/17 0426 06/21/17 1615 06/22/17 0411  WBC 1.4* 1.7* 1.1*  HGB 8.0* 8.4* 7.9*  HCT 25.3* 27.0* 25.3*  PLT 66* 60* 47*   Recent Labs  Lab 06/18/17 1837 06/19/17 0438 06/20/17 0608 06/21/17 0426 06/22/17 0411  NA 142 143 138 138 138  K 4.1 4.1 3.5 3.6 3.4*  CL 111 111 105 106 104  CO2 _0 BUN _1 CREATININE 1.02* 0.89 0.93 0.87 0.93  CALCIUM 9.4 8.9 8.7* 8.5* 8.4*  PROT 6.7 5.9*  --   --   --   BILITOT 1.7* 1.3*  --   --   --   ALKPHOS 45 39  --   --   --   ALT 17 15  --   --   --   AST 25 22  --   --   --   GLUCOSE 104* 99 114* 89 94   5/27 Reticulocytes <0.4% 5/26 Reticulocytes <0.4% 5/25 Reticulocytes <0.4% 5/25 Erythropoietin pending 5/24 Blood culture x2  NGTD 5/24 Folate pending 5/24 Vitamin B12 191 (low end of normal) 5/24 Iron and TIBC/ferritin (anemia of chronic inflammation) 5/24 Methotrexate pending 5/24 HIV antibody nonreactive 5/23 Urinalysis with microscopy cloudy, small bilirubin, moderate leuks 5/23 I-STAT lactic acid 1.01 (WNL)  Imaging/Diagnostic Tests: CT ABDOMEN AND PELVIS WITH CONTRAST (06/18/2017) 1. Acute diverticulitis of a portion of the proximal colon contained within the ventral abdominal hernia. No abscess or free intraperitoneal air. 2. Large ventral abdominal hernia containing small bowel and colon without obstruction. 3. 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 No obstruction. 4.  Aortic Atherosclerosis.  PORTABLE CHEST 1 VIEW (06/18/2017) Given rightward rotated position, no acute cardiopulmonary process.    Cheyenne Bing, DO 06/22/2017, 8:01 AM PGY-2, [place] Intern pager: [phone], text pages welcome

## 2017-06-22 NOTE — Consult Note (Signed)
[facility] Nurse ostomy follow up Stoma type/location: LLQ, end colostomy  Ostomy pouching: 1pc placed just prior to my arrival RN reports use of powder and barrier ring  Education provided:  Discussed again with patient use of ostomy powder until skin damage has improved.  I have told the patient that we will try to coordinate tomorrow to come and assess her skin She reports her skin is not better and that they changed her pouch 3-4 times over the weekend.  [facility] Nurse will follow along with you for continued support with ostomy teaching and care [place] MSN, RN, [place], [place], [place]

## 2017-06-22 NOTE — Progress Notes (Signed)
Critical lab value 1.1 called to MD

## 2017-06-22 NOTE — Consult Note (Addendum)
[place] Nurse ostomy consult note Stoma type/location: LLQ colostomy. Performed by Dr. Zella Richer in approximately 2005 (according to patient). Denudation in the peristomal field is the reason for this admission. Stomal assessment/size: 1 and 3/8 inches round, flush, stoma tips slightly into defect at 3 o'clock. Os at center Peristomal assessment: Severely denuded skin in the peristomal plane, located in a deep crease. This is a complex pouching situation.  Pouches will not adhere related to the moist bleeding skin surrounding the stoma.  It is extremely painful to touch.  Treatment options for stomal/peristomal skin: Severely denuded skin in the peristomal area measuring 4-inches from stoma at 3 o'clock, 6 o'clock and 9 o'clock treated with dusting of stoma powder and application of no sting skin barrier spray x2, fanning skin dry in-between treatments. A full skin barrier ring and a 1/2 skin barrier ring stretch and placed from 3-9 o'clock is applied at the border of the full ring. Output: mod amt liquid brown stool Ostomy pouching: 1pc. Flat pouching system with skin barrier ring, applied barrier adhesive strips around the edges.  Supplies left in the room and instructions provided for bedside nurses to perform. Pt will probably benefit from a belt, but we do not have one piece flexible pouches with belt tabs in the [place]. Patient will require the services of an [facility] post discharge to continue ostomy support, teaching and monitoring of the peristomal skin, despite having had her ostomy for over 10 years.  Enrolled patient in [place] program: No.  Patient is established with a supplier. [place] team will assess pouching situation daily. Julien Girt MSN, RN, [place], [place], [place]

## 2017-06-22 NOTE — Progress Notes (Signed)
I suppose that is no surprise that her blood counts are still dropping.  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  Her platelet count is dropping.  Her white cell count is dropping.  She needs to be on Neupogen.  Unfortunately, for some unknown reason, the computer system will not let me order Neupogen.  I do not know why this is happening.  But clearly Neupogen will help her.  I am awaiting the erythropoietin level.  She might benefit from Procrit.  Her cultures are all negative.  She had a low-grade temperature.  She has had no bleeding.  She was on Neupogen but for some reason it has been stopped.  I do not know why it has been stopped.  Again, the computer system somehow will not allow me to order Neupogen.  I am sure somebody who is computer literate will be able to figure out how to order Neupogen.  Her renal function is okay.  I do not see any other indicator of methotrexate toxicity.  I do not see that she needs to be transfused right now.  To me, I think it is incredibly important that we get her back on Neupogen.  I do not see that we have to do a bone marrow test on her.  I suppose this could always be considered.  It would not be easy given her size.  Lattie Haw, MD  [place] [phone]

## 2017-06-23 DIAGNOSIS — K9409 Other complications of colostomy: Secondary | ICD-10-CM | POA: Diagnosis not present

## 2017-06-23 DIAGNOSIS — K1230 Oral mucositis (ulcerative), unspecified: Secondary | ICD-10-CM | POA: Diagnosis not present

## 2017-06-23 DIAGNOSIS — K5792 Diverticulitis of intestine, part unspecified, without perforation or abscess without bleeding: Secondary | ICD-10-CM | POA: Diagnosis not present

## 2017-06-23 DIAGNOSIS — D61818 Other pancytopenia: Secondary | ICD-10-CM | POA: Diagnosis not present

## 2017-06-23 DIAGNOSIS — Z933 Colostomy status: Secondary | ICD-10-CM | POA: Diagnosis not present

## 2017-06-23 DIAGNOSIS — T451X1D Poisoning by antineoplastic and immunosuppressive drugs, accidental (unintentional), subsequent encounter: Secondary | ICD-10-CM

## 2017-06-23 DIAGNOSIS — M069 Rheumatoid arthritis, unspecified: Secondary | ICD-10-CM | POA: Diagnosis not present

## 2017-06-23 DIAGNOSIS — D61810 Antineoplastic chemotherapy induced pancytopenia: Secondary | ICD-10-CM | POA: Diagnosis not present

## 2017-06-23 DIAGNOSIS — D704 Cyclic neutropenia: Secondary | ICD-10-CM | POA: Diagnosis not present

## 2017-06-23 LAB — CBC WITH DIFFERENTIAL/PLATELET
BASOS ABS: 0 10*3/uL (ref 0.0–0.1)
Basophils Relative: 2 %
EOS ABS: 0.1 10*3/uL (ref 0.0–0.7)
Eosinophils Relative: 9 %
HCT: 23.2 % — ABNORMAL LOW (ref 36.0–46.0)
Hemoglobin: 7.3 g/dL — ABNORMAL LOW (ref 12.0–15.0)
LYMPHS ABS: 1 10*3/uL (ref 0.7–4.0)
Lymphocytes Relative: 81 %
MCH: 27.5 pg (ref 26.0–34.0)
MCHC: 31.5 g/dL (ref 30.0–36.0)
MCV: 87.5 fL (ref 78.0–100.0)
MONO ABS: 0.1 10*3/uL (ref 0.1–1.0)
Monocytes Relative: 5 %
NEUTROS PCT: 3 %
Neutro Abs: 0 10*3/uL — ABNORMAL LOW (ref 1.7–7.7)
PLATELETS: 30 10*3/uL — AB (ref 150–400)
RBC: 2.65 MIL/uL — AB (ref 3.87–5.11)
RDW: 14.6 % (ref 11.5–15.5)
WBC: 1.2 10*3/uL — AB (ref 4.0–10.5)

## 2017-06-23 LAB — ERYTHROPOIETIN: ERYTHROPOIETIN: 258.3 m[IU]/mL — AB (ref 2.6–18.5)

## 2017-06-23 LAB — COMPREHENSIVE METABOLIC PANEL
ALBUMIN: 2.1 g/dL — AB (ref 3.5–5.0)
ALT: 20 U/L (ref 14–54)
AST: 29 U/L (ref 15–41)
Alkaline Phosphatase: 39 U/L (ref 38–126)
Anion gap: 10 (ref 5–15)
BUN: 7 mg/dL (ref 6–20)
CHLORIDE: 101 mmol/L (ref 101–111)
CO2: 26 mmol/L (ref 22–32)
Calcium: 8.2 mg/dL — ABNORMAL LOW (ref 8.9–10.3)
Creatinine, Ser: 1.05 mg/dL — ABNORMAL HIGH (ref 0.44–1.00)
GFR calc Af Amer: >60 mL/min (ref >60)
GFR, EST NON AFRICAN AMERICAN: 53 mL/min — AB (ref >60)
Glucose, Bld: 100 mg/dL — ABNORMAL HIGH (ref 65–99)
POTASSIUM: 3.2 mmol/L — AB (ref 3.5–5.1)
SODIUM: 137 mmol/L (ref 135–145)
Total Bilirubin: 0.7 mg/dL (ref 0.3–1.2)
Total Protein: 5.5 g/dL — ABNORMAL LOW (ref 6.5–8.1)

## 2017-06-23 LAB — FOLATE RBC
FOLATE, RBC: 1125 ng/mL (ref >498)
Folate, Hemolysate: 256.5 ng/mL
HEMATOCRIT: 22.8 % — AB (ref 34.0–46.6)

## 2017-06-23 LAB — RETICULOCYTES: RBC.: 2.65 MIL/uL — AB (ref 3.87–5.11)

## 2017-06-23 MED ORDER — ENSURE ENLIVE PO LIQD
237.0000 mL | Freq: Two times a day (BID) | ORAL | Status: DC
  Administered 2017-06-23 – 2017-07-02 (×17): 237 mL via ORAL

## 2017-06-23 MED ORDER — DARBEPOETIN ALFA 100 MCG/0.5ML IJ SOSY
100.0000 ug | PREFILLED_SYRINGE | INTRAMUSCULAR | Status: DC
  Administered 2017-06-23 – 2017-06-30 (×2): 100 ug via SUBCUTANEOUS
  Filled 2017-06-23: qty 1.68
  Filled 2017-06-23 (×3): qty 0.5

## 2017-06-23 MED ORDER — DARBEPOETIN ALFA 25 MCG/0.42ML IJ SOSY: 25.0000 ug | PREFILLED_SYRINGE | INTRAMUSCULAR | Status: DC

## 2017-06-23 MED ORDER — DEXTROSE 5 % IV SOLN
600.0000 mg | Freq: Three times a day (TID) | INTRAVENOUS | Status: DC
  Administered 2017-06-23 – 2017-06-26 (×8): 600 mg via INTRAVENOUS
  Filled 2017-06-23 (×9): qty 12
  Filled 2017-06-23: qty 10
  Filled 2017-06-23 (×3): qty 12

## 2017-06-23 NOTE — Consult Note (Addendum)
[place] Nurse ostomy consultnote Pouch was changed again last night by staff nurses and is currently leaking again; they are lasting less than half a day.   Stoma type/location:LLQ colostomy. Stomal assessment/size:1 and 3/8 inches round, flush, stoma tips slightly into defect at 3 o'clock. Os at center Peristomal assessment:Severely denuded skin in the peristomal plane, located in a deep crease. This is a complex pouching situation.  Pouches will not adhere related to the moist bleeding skin surrounding the stoma.  It is extremely painful to touch.  Treatment options for stomal/peristomal skin:Severely denuded skin in the peristomal area measuring 4-inches from stoma at 3 o'clock, 6 o'clock and 9 o'clock treated with dusting of stoma powder and application of no sting skin barrier spray x2, fanning skin dry in-between treatments. A full skin barrier ring and a flexible convex pouch with a belt added; this product is not available in the [place] and this is a free sample from [place]. Output: mod amt liquid brown stool Applied barrier adhesive strips around the edges.  Supplies left in the room and instructions provided for bedside nurses to perform.  Patient will require the services of an [facility] post discharge to continue ostomy support, teaching and monitoring of the peristomal skin, despite having had her ostomy for over 10 years. Enrolled patient in [place] program: No. Patient is established with a supplier. [place] team will assess pouching situation daily. Julien Girt MSN, RN, [place], [place], [place]

## 2017-06-23 NOTE — Progress Notes (Signed)
Unfortunately, I think we are still seeing some effects of the methotrexate.  I am a little surprised that she is not yet rebounded.  Her blood counts today show her white cell count to be 1.2.  Hemoglobin 7.3.  Platelet count 30,000.  Her renal function is doing okay.  Her potassium is 3.2.  So far all of her cultures have been negative.  It is hard to say how the diverticulitis is doing.  She is had no blood per rectum.  I still do not think that a bone marrow test needs to be done.  There is still some mucositis on her lips.  She had been on acyclovir.  I am not sure why this was discontinued.  I think she still needs to be on acyclovir.  As far as her transfusion requirements, she definitely is getting to be borderline for transfusion.  I think if her hemoglobin gets below 7, she definitely needs to be transfused.  I really cannot see anything else that needs to be done in all honesty.  She now is back on Neupogen.  I think this will definitely help.  We are awaiting the erythropoietin level.  She could certainly benefit from Procrit.  She is getting wonderful care from everybody up on [address].  I think this is just a matter of waiting until her marrow starts to recover.  Lattie Haw, MD  [address] [place] 4:12-13

## 2017-06-23 NOTE — Progress Notes (Signed)
Family Medicine Teaching Service Daily Progress Note Intern Pager: [phone]  Patient name: [name] record number: [id number] Date of birth: 1945/06/15 Age: 72 y.o. Gender: female  Primary Care Provider: Dixie Dials, MD Consultants: Hemonc, Wound care Code Status: Full  Pt Overview and Major Events to Date:  5/23 Admitted for fever and pancytopenia found to have diverticulitis on CT abd/pelvis 5/24 Found to have methotrexate toxicity, transfused 1u RBC 5/27 Initiated Neupogen per Dr. Marin Olp recs (Hemonc)  Assessment and Plan: [name] is a 72 y.o. female presenting with diverticulitis and pancytopenia secondary to methotrexate toxicity.PMH is significant for rheumatoid arthritis, h/o chronic diverticulitis with colostomy, hypothyroidism, HTN, HLD, CAD, peripheral edema.  1.  Pancytopenia  Methotrexate toxicity: Acute, still no indication of recovery.  Secondary to methotrexate use daily despite instructions to take every Friday only.  Dr. Marin Olp with hematology-oncology following.  Recommending Neupogen given absent response with reticulocytes at present.  WBC 1.2, Hgb 7.3, platelets 30, retic <0.04% - Hematology-oncology consulted, appreciate recommendations - daily Neupogen - Daily folic acid supplementation - Daily CBC with differential - Pending erythropoietin and methotrexate level -will discuss potential 2nd transfusion but patient is asymptomatic (no chills/weakness/fatigue)  2.  Acute on chronic diverticulitis  Fever  Abdominal pain:  Remains afebrile on multiple antibiotics.  Confirmed acute diverticulitis on CT abdomen/pelvis upon arrival.  Stoma site with significant lesions per wound care.  Patient continues to produce stool without vomiting.  Abdominal pain seems to be resolved.  No signs of acute abdomen. - Continue IV Cipro and Flagyl (5/23-) - Soft diet (does not want advancing as she thinks regular would be hard on her mouth) -ice chips for mouth  sores (magic mouthwash hurt)  3.  Ventral Hernia: Chronic.  Large without dilation of GI tract or signs of obstruction. - Continue monitoring bowel movements daily  4.  Stomatitis with ulceration:  Acute. Secondary to methotrexate toxicity. - Wound care consulted, appreciate recommendations - Acyclovir (5/25-) - Continue oral hygiene  5.  Left lower extremity erythema: resolved to symetric swelling with left leg, warmth resolved.   - Continue monitoring and antibiotics per above, problem #2  6.  Anemia of chronic inflammation:  On iron tablets at home.  Iron studies support adequate iron stores and high saturation ratio.  Ferritin elevated 431. - Holding home ferrous sulfate 325 mg daily - Pending erythropoietin level  7.  Rheumatoid arthritis: Chronic.  Stable.  Recently switched rheumatologist and patient is on methotrexate.  Patient presenting with toxicity due to taking daily methotrexate. - Holding home methotrexate 2.5 mg every Friday in light of toxicity, see plan #1 above - Continue folate supplementation  8.  Hypothyroidism: Chronic.  On Synthroid at home. - Continue home Synthroid 50 mcg daily  9.  Hyperlipidemia: Chronic.  On simvastatin and daily aspirin at home. - Continue home simvastatin 40 mg daily - Holding home aspirin 325 mg daily due to pancytopenia and diverticulitis  10.  Chronic pain:  Medications include Norco 10-325 every 4 hours as needed and NSAIDs. - Tylenol 650 g every 6 hours as needed for mild pain, ibuprofen 600 mg every 6 hours as needed for moderate pain - Continue Norco 10-325 tablet every 4 hours as needed - Holding home naproxen 440 mg twice daily as needed  FEN/GI: Soft diet (does not want regular) Prophylaxis: SCD d/t pancytopenia  Disposition:  on protective precaution with significant neutropenia, at threshold for potential 2nd transfusion but asymptomatic  Subjective:  Very uncomfortable  with mouth sores and stoma breakdown,  breathing well/digesting well.  No chills/fatigue  Objective: Temp:  [98.5 F (36.9 C)-100 F (37.8 C)] 98.5 F (36.9 C) (05/28 0326) Pulse Rate:  [81-94] 81 (05/28 0326) Resp:  [17-18] 18 (05/28 0326) BP: (133-154)/(59-81) 133/67 (05/28 0326) SpO2:  [93 %-100 %] 98 % (05/28 0326) Weight:  [263 lb 0.1 oz (119.3 kg)] 263 lb 0.1 oz (119.3 kg) (05/28 0326) Physical Exam: General: NAD, pleasant but with visible discomfort due to sores/swelling of lips Eyes: EOMI, no conjunctival pallor or injection ENTM: significant bloody lesions of mouth Cardiovascular: RRR, no m/r/g, 1+ edema Respiratory: CTA BL, normal work of breathing Gastrointestinal: soft, nontender, nondistended, normoactive BS MSK: moves 4 extremities equally, chronic left should restriction Derm: skin breakdown around stoma per wound care Neuro: CN II-XII grossly intact Psych: AOx3, appropriate affect   06/22/2017   06/18/2017     Laboratory: Recent Labs  Lab 06/21/17 1615 06/22/17 0411 06/23/17 0535  WBC 1.7* 1.1* 1.2*  HGB 8.4* 7.9* 7.3*  HCT 27.0* 25.3* 23.2*  PLT 60* 47* 30*   Recent Labs  Lab 06/18/17 1837 06/19/17 0438  06/21/17 0426 06/22/17 0411 06/23/17 0535  NA 142 143   < > 138 138 137  K 4.1 4.1   < > 3.6 3.4* 3.2*  CL 111 111   < > 106 104 101  CO2 23 24   < > _0 BUN 19 15   < > _1 CREATININE 1.02* 0.89   < > 0.87 0.93 1.05*  CALCIUM 9.4 8.9   < > 8.5* 8.4* 8.2*  PROT 6.7 5.9*  --   --   --  5.5*  BILITOT 1.7* 1.3*  --   --   --  0.7  ALKPHOS 45 39  --   --   --  39  ALT 17 15  --   --   --  20  AST 25 22  --   --   --  29  GLUCOSE 104* 99   < > 89 94 100*   < > = values in this interval not displayed.   5/25- 28 Reticulocytes <0.4% 5/25 Erythropoietin pending 5/24 Blood culture x2 NGTD 5/24 Folate pending 5/24 Vitamin B12 191 (low end of normal) 5/24 Iron and TIBC/ferritin (anemia of chronic inflammation) 5/24 Methotrexate pending 5/24 HIV antibody  nonreactive 5/23 Urinalysis with microscopy cloudy, small bilirubin, moderate leuks 5/23 I-STAT lactic acid 1.01 (WNL)  Imaging/Diagnostic Tests: CT ABDOMEN AND PELVIS WITH CONTRAST (06/18/2017) 1. Acute diverticulitis of a portion of the proximal colon contained within the ventral abdominal hernia. No abscess or free intraperitoneal air. 2. Large ventral abdominal hernia containing small bowel and colon without obstruction. 3. 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 No obstruction. 4.  Aortic Atherosclerosis.  PORTABLE CHEST 1 VIEW (06/18/2017) Given rightward rotated position, no acute cardiopulmonary process.    Sherene Sires, DO 06/23/2017, 6:36 AM PGY-2, [place] Intern pager: [phone], text pages welcome

## 2017-06-24 ENCOUNTER — Inpatient Hospital Stay (HOSPITAL_COMMUNITY): Payer: Medicare Other

## 2017-06-24 DIAGNOSIS — K9409 Other complications of colostomy: Secondary | ICD-10-CM | POA: Diagnosis not present

## 2017-06-24 DIAGNOSIS — K1230 Oral mucositis (ulcerative), unspecified: Secondary | ICD-10-CM | POA: Diagnosis not present

## 2017-06-24 DIAGNOSIS — T451X1D Poisoning by antineoplastic and immunosuppressive drugs, accidental (unintentional), subsequent encounter: Secondary | ICD-10-CM | POA: Diagnosis not present

## 2017-06-24 DIAGNOSIS — D61810 Antineoplastic chemotherapy induced pancytopenia: Secondary | ICD-10-CM | POA: Diagnosis not present

## 2017-06-24 DIAGNOSIS — D61818 Other pancytopenia: Secondary | ICD-10-CM | POA: Diagnosis not present

## 2017-06-24 DIAGNOSIS — M069 Rheumatoid arthritis, unspecified: Secondary | ICD-10-CM | POA: Diagnosis not present

## 2017-06-24 DIAGNOSIS — Z933 Colostomy status: Secondary | ICD-10-CM | POA: Diagnosis not present

## 2017-06-24 DIAGNOSIS — K5792 Diverticulitis of intestine, part unspecified, without perforation or abscess without bleeding: Secondary | ICD-10-CM | POA: Diagnosis not present

## 2017-06-24 LAB — RETICULOCYTES
RBC.: 2.67 MIL/uL — AB (ref 3.87–5.11)
Retic Count, Absolute: 13.4 10*3/uL — ABNORMAL LOW (ref 19.0–186.0)
Retic Ct Pct: 0.5 % (ref 0.4–3.1)

## 2017-06-24 LAB — COMPREHENSIVE METABOLIC PANEL
ALBUMIN: 2.2 g/dL — AB (ref 3.5–5.0)
ALT: 17 U/L (ref 14–54)
AST: 24 U/L (ref 15–41)
Alkaline Phosphatase: 36 U/L — ABNORMAL LOW (ref 38–126)
Anion gap: 10 (ref 5–15)
BUN: 5 mg/dL — AB (ref 6–20)
CHLORIDE: 103 mmol/L (ref 101–111)
CO2: 28 mmol/L (ref 22–32)
Calcium: 8.5 mg/dL — ABNORMAL LOW (ref 8.9–10.3)
Creatinine, Ser: 0.98 mg/dL (ref 0.44–1.00)
GFR calc Af Amer: >60 mL/min (ref >60)
GFR calc non Af Amer: 57 mL/min — ABNORMAL LOW (ref >60)
Glucose, Bld: 110 mg/dL — ABNORMAL HIGH (ref 65–99)
POTASSIUM: 2.9 mmol/L — AB (ref 3.5–5.1)
Sodium: 141 mmol/L (ref 135–145)
Total Bilirubin: 0.5 mg/dL (ref 0.3–1.2)
Total Protein: 5.7 g/dL — ABNORMAL LOW (ref 6.5–8.1)

## 2017-06-24 LAB — CULTURE, BLOOD (ROUTINE X 2)
Culture: NO GROWTH
Culture: NO GROWTH
Special Requests: ADEQUATE

## 2017-06-24 LAB — CBC WITH DIFFERENTIAL/PLATELET
BASOS PCT: 0 %
Basophils Absolute: 0 10*3/uL (ref 0.0–0.1)
EOS PCT: 12 %
Eosinophils Absolute: 0.1 10*3/uL (ref 0.0–0.7)
HEMATOCRIT: 23.5 % — AB (ref 36.0–46.0)
HEMOGLOBIN: 7.4 g/dL — AB (ref 12.0–15.0)
LYMPHS ABS: 0.8 10*3/uL (ref 0.7–4.0)
Lymphocytes Relative: 78 %
MCH: 27.6 pg (ref 26.0–34.0)
MCHC: 31.5 g/dL (ref 30.0–36.0)
MCV: 87.7 fL (ref 78.0–100.0)
MONOS PCT: 7 %
Monocytes Absolute: 0.1 10*3/uL (ref 0.1–1.0)
Neutro Abs: 0 10*3/uL — ABNORMAL LOW (ref 1.7–7.7)
Neutrophils Relative %: 3 %
Platelets: 24 10*3/uL — CL (ref 150–400)
RBC: 2.68 MIL/uL — AB (ref 3.87–5.11)
RDW: 14.6 % (ref 11.5–15.5)
WBC: 1 10*3/uL — AB (ref 4.0–10.5)

## 2017-06-24 LAB — METHOTREXATE

## 2017-06-24 LAB — PATHOLOGIST SMEAR REVIEW

## 2017-06-24 IMAGING — DX DG SHOULDER 2+V*L*
3 series · 3 of 3 positions shown · non-contrast
Comparison: [DATE]

CLINICAL DATA: Chronic shoulder pain.  Decreased range of motion

EXAM:
LEFT SHOULDER - 2+ VIEW

[shoulder y view]
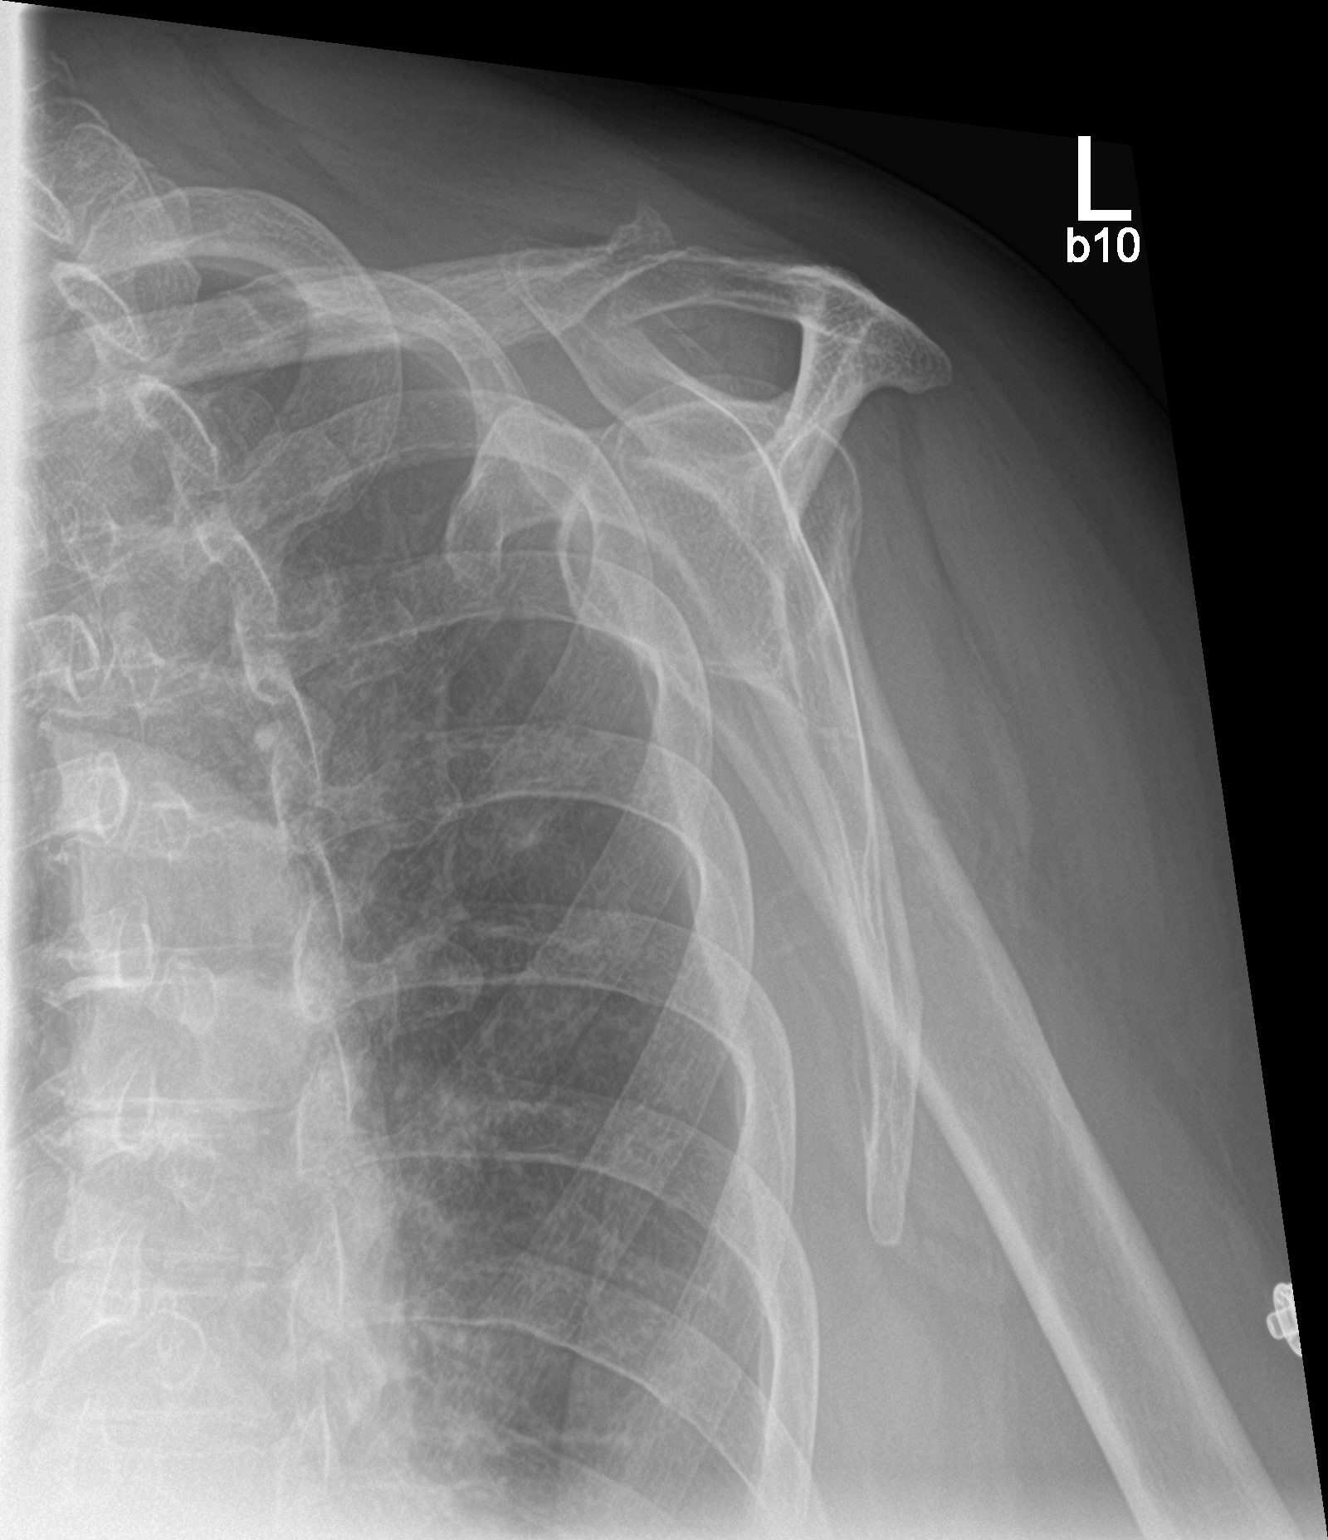

[shoulder axillary]
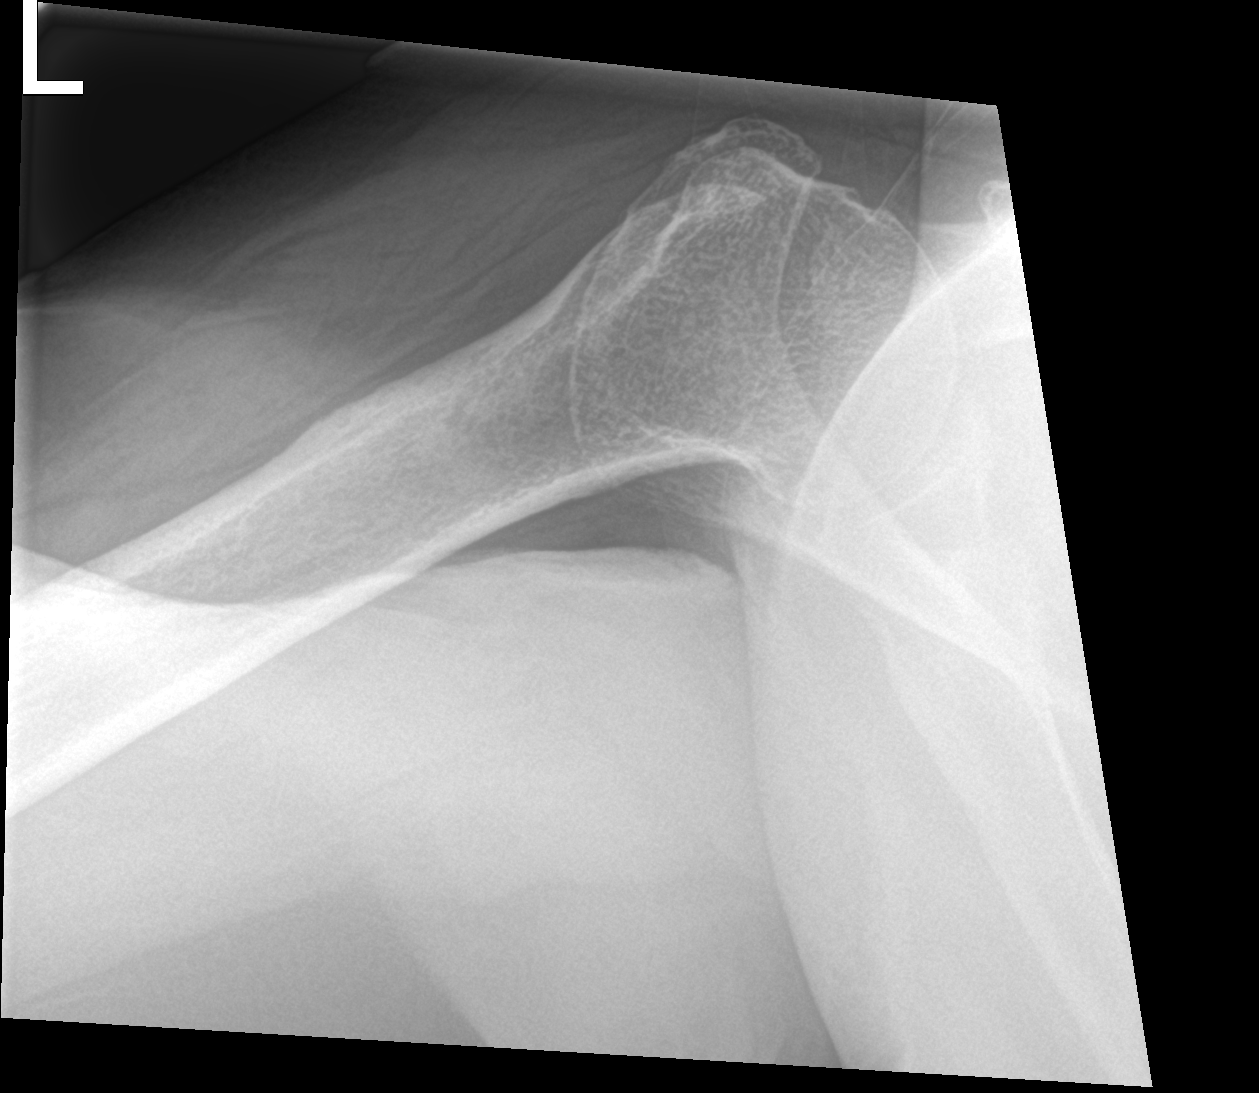

[shoulder grashey]
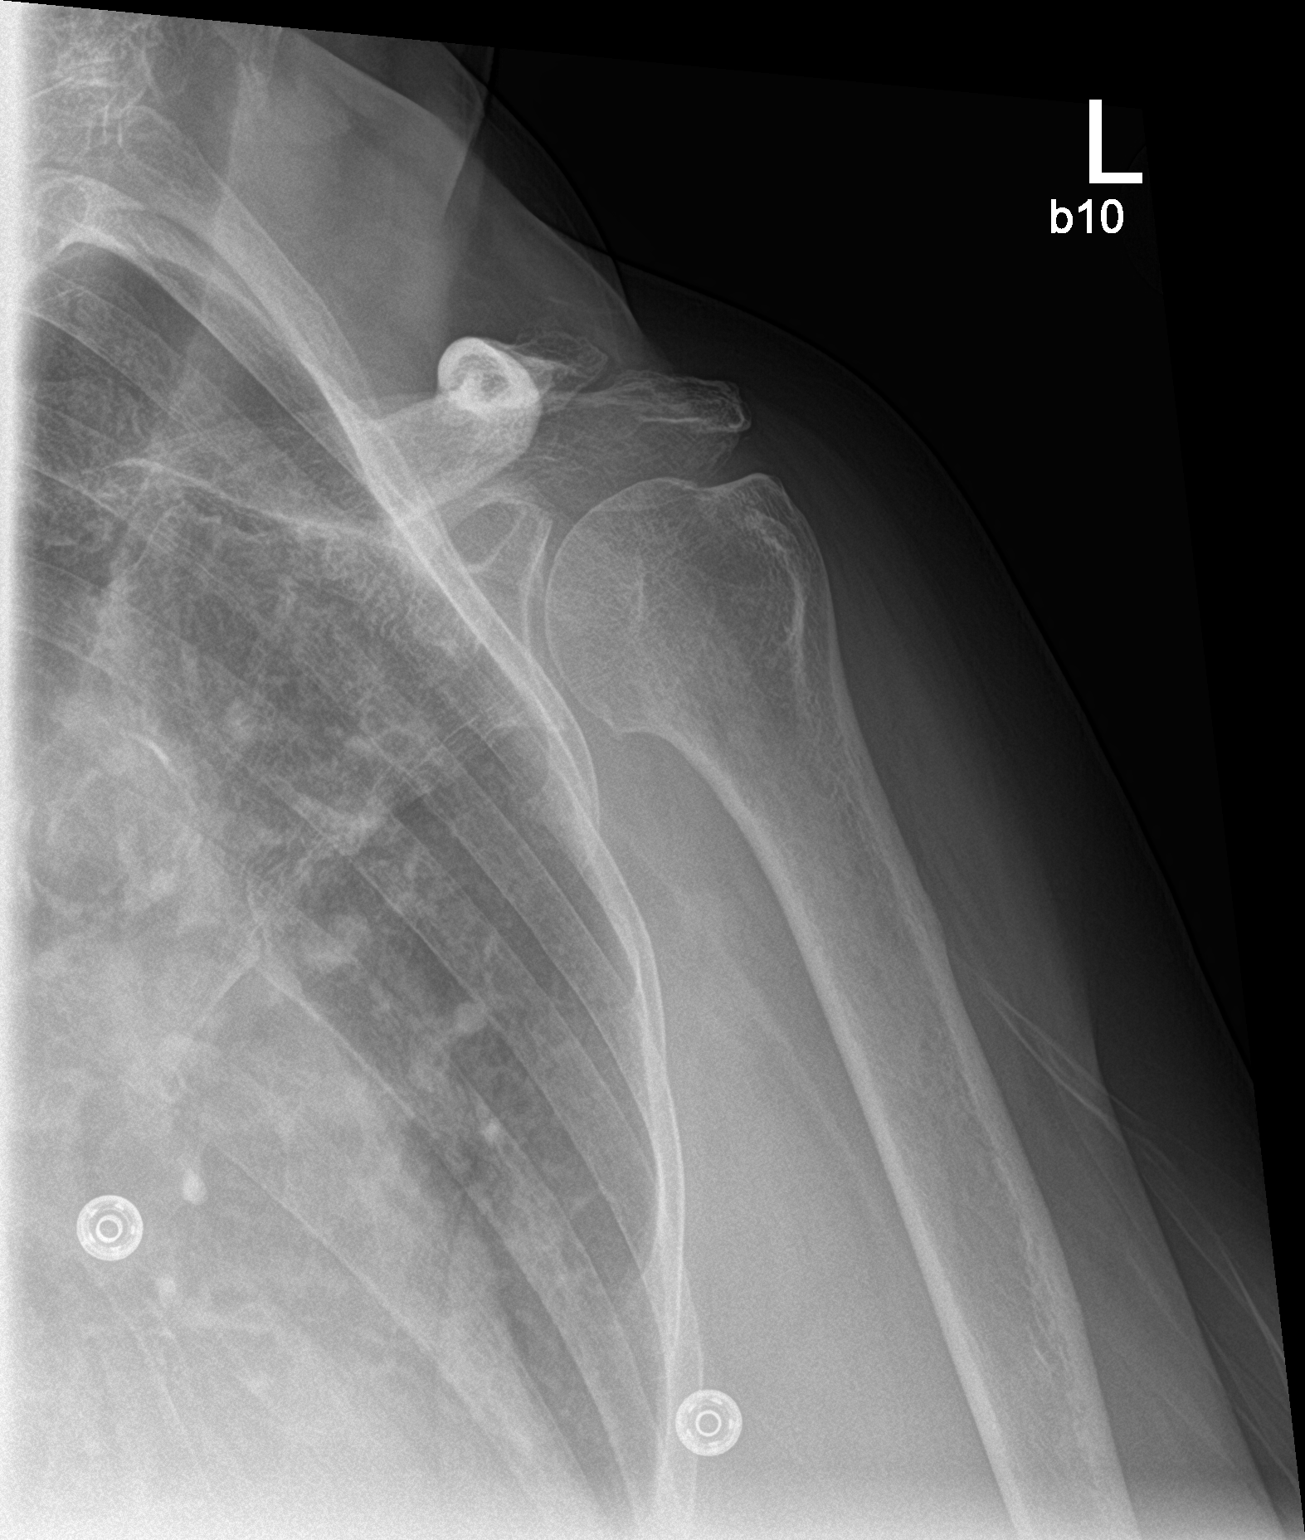

[3 of 3 positions shown; findings below may reference images not displayed]

FINDINGS: Mild inferior glenohumeral spurring. No joint narrowing on the
axillary view. No evidence of fracture or malalignment. No evidence
of bone lesion.
IMPRESSION: Mild glenohumeral spurring without joint narrowing.

## 2017-06-24 MED ORDER — BISMUTH SUBSALICYLATE 262 MG/15ML PO SUSP
30.0000 mL | ORAL | Status: DC | PRN
  Filled 2017-06-24: qty 236

## 2017-06-24 MED ORDER — LIDOCAINE VISCOUS HCL 2 % MT SOLN
15.0000 mL | OROMUCOSAL | Status: DC | PRN
  Administered 2017-06-24 – 2017-06-25 (×3): 15 mL via OROMUCOSAL
  Filled 2017-06-24 (×3): qty 15

## 2017-06-24 MED ORDER — WHITE PETROLATUM EX OINT
TOPICAL_OINTMENT | CUTANEOUS | Status: AC
  Administered 2017-06-24: 17:00:00
  Filled 2017-06-24: qty 28.35

## 2017-06-24 MED ORDER — ALUM & MAG HYDROXIDE-SIMETH 200-200-20 MG/5ML PO SUSP: 15.0000 mL | ORAL | Status: DC | PRN

## 2017-06-24 MED ORDER — POTASSIUM CHLORIDE CRYS ER 20 MEQ PO TBCR
40.0000 meq | EXTENDED_RELEASE_TABLET | ORAL | Status: AC
  Administered 2017-06-24 (×2): 40 meq via ORAL
  Filled 2017-06-24 (×2): qty 2

## 2017-06-24 NOTE — Consult Note (Signed)
[place] Nurse ostomy follow-up consultnote Pouch has lasted 24 hours, it is currently beginning to leak behind the barrier. Stoma type/location:LLQ colostomy. Stomal assessment/size:1 and 3/8 inches round, flush, stoma tips slightly into defect at 3 o'clock. Os at center Peristomal assessment:Severely denuded skin in the peristomal plane has slightly decreased; area is 5X2X.1cm to left of stoma, red and moist and weeping, painful to touch, located in a deep crease. This is a complex pouching situation. Pouches will not adhere related to the moist bleeding skin surrounding the stoma.  Treatment options for stomal/peristomal skin:Severely denuded skin in the peristomal area treated with dusting of stoma powder and application of no sting skin barrier spray x2, fanning skin dry in-between treatments. A  flexible convex pouch with a belt applied; this product is not available in the [place] and this is a free sample from [place]. Output:Large amt liquidbrown stool; pouch will need to be emptied frequently. Applied barrier adhesive strips around the edges. Supplies left in the room and instructions provided for bedside nurses to perform.  Patient will require the services of an [facility] post discharge to continue ostomy support, teaching and monitoring of the peristomal skin, despite having had her ostomy for over 10 years. Enrolled patient in [place] program: No. Patient is established with a supplier. [place] team will assess pouching situation daily. Julien Girt MSN, RN, [place], [place], [place]

## 2017-06-24 NOTE — Progress Notes (Signed)
Family Medicine Teaching Service Daily Progress Note Intern Pager: [phone]  Patient name: [name] record number: [id number] Date of birth: May 01, 1945 Age: 72 y.o. Gender: female  Primary Care Provider: Dixie Dials, MD Consultants: Hemonc, Wound care Code Status: Full  Pt Overview and Major Events to Date:  5/23 Admitted for fever and pancytopenia found to have diverticulitis on CT abd/pelvis 5/24 Found to have methotrexate toxicity, transfused 1u RBC 5/27 Initiated Neupogen per Dr. Marin Holt recs (Hemonc)  Assessment and Plan: [name] is a 72 y.o. female presenting with diverticulitis and pancytopenia secondary to methotrexate toxicity.PMH is significant for rheumatoid arthritis, h/o chronic diverticulitis with colostomy, hypothyroidism, HTN, HLD, CAD, peripheral edema.   Pancytopenia  Methotrexate toxicity: WBC 1.0, Hgb 7.4, platelets 24, retic now measruable at 0.05%/13.4 absolute.  Methotrexate level was 0.03 but unsure how long after ingestion that lab actually was drawn given patient's story. - Hematology-oncology (Dr. Marin Holt) consulted, appreciate recommendations - daily Neupogen - Daily folic acid supplementation - Daily CBC with differential - no leucovorin given low methotrexate lab -epo 100 weekly until recovered - current transfusion threshold is 7.0 per Dr. Marin Holt - Did call dermatology for suggestions on symptomatic comfort for patient, had to leave message  Hypokalemia: 2.9 -kdur 40 x 2, will order IV if patient has trouble with tab  Shoulder pain: acutely increased over chronic -will discuss appropriate imaging with team in AM   Acute on chronic diverticulitis Stable.  Confirmed acute diverticulitis on CT abdomen/pelvis upon arrival.  Stoma site with significant lesions per wound care.  Patient continues to produce stool without vomiting.  No signs of acute abdomen. - Continue IV Cipro and Flagyl (5/23-) - Soft diet (does not want advancing as  she thinks regular would be hard on her mouth) -ice chips for mouth sores (magic mouthwash hurt)   Ventral Hernia: Chronic.  Large without dilation of GI tract or signs of obstruction. - Continue monitoring bowel movements daily    Stomatitis with ulceration:  Acute. Secondary to methotrexate toxicity. - Wound care consulted, appreciate recommendations (will need [place] nurse for stoma care) - Acyclovir (5/25-) - Continue oral hygiene    Left lower extremity erythema: resolved to symetric swelling with left leg, warmth resolved.   - Continue monitoring and antibiotics per above, problem #2    Anemia of chronic inflammation:  On iron tablets at home.  Iron studies support adequate iron stores and high saturation ratio.  Ferritin elevated 431. - Holding home ferrous sulfate 325 mg daily - Pending erythropoietin level   Rheumatoid arthritis: Chronic.  Stable.  Recently switched rheumatologist and patient is on methotrexate.  Patient presenting with toxicity due to taking daily methotrexate. - Holding home methotrexate 2.5 mg every Friday in light of toxicity, see plan #1 above - Continue folate supplementation    Hypothyroidism: Chronic.  On Synthroid at home. - Continue home Synthroid 50 mcg daily    Hyperlipidemia: Chronic.  On simvastatin and daily aspirin at home. - Continue home simvastatin 40 mg daily - Holding home aspirin 325 mg daily due to pancytopenia and diverticulitis    Chronic pain:  Medications include Norco 10-325 every 4 hours as needed and NSAIDs. - Tylenol 650 g every 6 hours as needed for mild pain, ibuprofen 600 mg every 6 hours as needed for moderate pain - Continue Norco 10-325 tablet every 4 hours as needed - Holding home naproxen 440 mg twice daily as needed  FEN/GI: Soft diet (does not want regular) Prophylaxis:  SCD d/t pancytopenia  Disposition:  unchanged, on protective precaution with significant neutropenia, at threshold for potential 2nd  transfusion but asymptomatic  Subjective:  Still very uncomfortable in the mouth, no respiratory complaints/concerns.  Understands her immune system is weak and had been clear with family to not visit if they are sick at all  Objective: Temp:  [98.3 F (36.8 C)-100.2 F (37.9 C)] 98.4 F (36.9 C) (05/29 0346) Pulse Rate:  [77-86] 77 (05/29 0346) Resp:  [17-18] 18 (05/29 0346) BP: (117-148)/(62-85) 148/66 (05/29 0346) SpO2:  [96 %-100 %] 98 % (05/29 0346) Weight:  [264 lb 12.4 oz (120.1 kg)] 264 lb 12.4 oz (120.1 kg) (05/29 0346) Physical Exam: General: clearly uncomfortable but stable Eyes: clear sclera ENTM: mucositis w/ bloody crusting particularly along lower lip Cardiovascular: RRR, 2/6 murmur, 1+ edema in LE Respiratory: CTA BL, normal work of breathing Gastrointestinal: soft, no TTP, stoma excoriation MSK: still with shoulder pain on left Derm: skin breakdown around stoma per wound care Neuro: no CN deficits noted Psych: AOx3, appropriate  Laboratory: Recent Labs  Lab 06/21/17 1615 06/22/17 0411 06/23/17 0535  WBC 1.7* 1.1* 1.2*  HGB 8.4* 7.9* 7.3*  HCT 27.0* 25.3* 23.2*  PLT 60* 47* 30*   Recent Labs  Lab 06/18/17 1837 06/19/17 0438  06/21/17 0426 06/22/17 0411 06/23/17 0535  NA 142 143   < > 138 138 137  K 4.1 4.1   < > 3.6 3.4* 3.2*  CL 111 111   < > 106 104 101  CO2 23 24   < > _0 BUN 19 15   < > _1 CREATININE 1.02* 0.89   < > 0.87 0.93 1.05*  CALCIUM 9.4 8.9   < > 8.5* 8.4* 8.2*  PROT 6.7 5.9*  --   --   --  5.5*  BILITOT 1.7* 1.3*  --   --   --  0.7  ALKPHOS 45 39  --   --   --  39  ALT 17 15  --   --   --  20  AST 25 22  --   --   --  29  GLUCOSE 104* 99   < > 89 94 100*   < > = values in this interval not displayed.   5/29 Reticulocytes finally measurable 0.05%, absolute 13.4 5/25- 28 Reticulocytes <0.4%  5/25 Erythropoietin pending 5/24 Blood culture x2 NGTD 5/24 Folate pending 5/24 Vitamin B12 191 (low end of normal) 5/24  Iron and TIBC/ferritin (anemia of chronic inflammation) 5/24 Methotrexate pending 5/24 HIV antibody nonreactive 5/23 Urinalysis with microscopy cloudy, small bilirubin, moderate leuks 5/23 I-STAT lactic acid 1.01 (WNL)  Imaging/Diagnostic Tests: CT ABDOMEN AND PELVIS WITH CONTRAST (06/18/2017) 1. Acute diverticulitis of a portion of the proximal colon contained within the ventral abdominal hernia. No abscess or free intraperitoneal air. 2. Large ventral abdominal hernia containing small bowel and colon without obstruction. 3. 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 No obstruction. 4.  Aortic Atherosclerosis.  PORTABLE CHEST 1 VIEW (06/18/2017) Given rightward rotated position, no acute cardiopulmonary process.  Sherene Sires, DO 06/24/2017, 6:08 AM PGY-1, [place] Intern pager: [phone], text pages welcome

## 2017-06-24 NOTE — Care Management Note (Signed)
Case Management Note  Patient Details  Name: [name] MRN: [id number] Date of Birth: 1945-05-15  Subjective/Objective:                    Action/Plan: Awaiting PT/OT evals. CM following for d/c needs, physician orders.   Expected Discharge Date:                  Expected Discharge Plan:  Home/Self Care  In-House Referral:     Discharge planning Services     Post Acute Care Choice:    Choice offered to:     DME Arranged:    DME Agency:     HH Arranged:    HH Agency:     Status of Service:  In process, will continue to follow  If discussed at Long Length of Stay Meetings, dates discussed:    Additional Comments:  Pollie Friar, RN 06/24/2017, 1:00 PM

## 2017-06-24 NOTE — Evaluation (Signed)
Physical Therapy Evaluation Patient Details Name: [name] MRN: [id number] DOB: [birth year] Today's Date: 06/24/2017   History of Present Illness    [name] is a 72 y.o. female presenting with diverticulitis and pancytopenia secondary to methotrexate toxicity.PMH is significant for rheumatoid arthritis, h/o chronic diverticulitis with colostomy, hypothyroidism, HTN, HLD, CAD, peripheral edema.  Clinical Impression  Pt pleasant and willing to participate with therapy. Pt on protective precautions and presents with L shoulder pain and exhibits limited active and passive L shoulder flexion/abduction/ER. Pt demonstrates mobility deficits related to bed mobility, ADLs (donning socks), transfers, and gait. Pt would benefit from skilled acute PT to address above deficits and to improve level of independence as pt wishes to return home without assistance.     Follow Up Recommendations Supervision/Assistance - 24 hour;Home health PT    Equipment Recommendations  None recommended by PT    Recommendations for Other Services OT consult     Precautions / Restrictions Precautions Precautions: Other (comment);Fall(protective) Precaution Comments: Fall Precaution: Pt lacks stability with gait and becomes fatigued with activity Restrictions Weight Bearing Restrictions: No      Mobility  Bed Mobility Overal bed mobility: Needs Assistance Bed Mobility: Supine to Sit     Supine to sit: HOB elevated;Min guard     General bed mobility comments: Pt performed supine>sit with HOB slightly elevated with use of upper R bed rail. Increased time required and min guard with pt @ EOB for safety. Pt unable to don socks when sitting EOB  Transfers Overall transfer level: Needs assistance   Transfers: Sit to/from Stand Sit to Stand: Min guard         General transfer comment: Min guard for safety with cues to push up from bed  Ambulation/Gait Ambulation/Gait assistance: Min guard Ambulation  Distance (Feet): 75 Feet Assistive device: Rolling walker (2 wheeled);Straight cane Gait Pattern/deviations: Step-through pattern;Decreased stride length;Trendelenburg Gait velocity: Decreased   General Gait Details: Pt ambulated 15 ft with cane in room but switched to RW for remaider of gait due to fatigue and lack of stability. With cane trunk lean over RLE during stance was exaggerated. Pt improved gait speed, stability, and gait quality with RW vs cane. Min guard for Scientist, research (medical)    Modified Rankin (Stroke Patients Only)       Balance Overall balance assessment: Needs assistance Sitting-balance support: Feet supported;No upper extremity supported Sitting balance-Leahy Scale: Fair Sitting balance - Comments: Pt able to sit EOB without UE but appeared fatigued doing so Postural control: Posterior lean Standing balance support: No upper extremity supported Standing balance-Leahy Scale: Fair Standing balance comment: Pt able to stand at sink and wash hands without UE support but difficulty with reaching for paper towels and required assistance                             Pertinent Vitals/Pain Pain Assessment: 0-10 Pain Score: 10-Worst pain ever Pain Descriptors / Indicators: Sharp Pain Intervention(s): Limited activity within patient's tolerance;Monitored during session    Home Living Family/patient expects to be discharged to:: Private residence Living Arrangements: Alone Available Help at Discharge: Family;Available 24 hours/day(1 daughter) Type of Home: Apartment Home Access: Level entry     Home Layout: One level Home Equipment: Bedside commode;Walker - 2 wheels;Cane - single point;Other (comment)(Rollator) Additional Comments: Uses cane in LUE most of the time but in  RUE more recently due to recent L shoulder pain    Prior Function Level of Independence: Independent with assistive device(s)         Comments: Pt  mainly stays at home with someone transporting her to doctors appts. No falls in last year     Hand Dominance        Extremity/Trunk Assessment   Upper Extremity Assessment Upper Extremity Assessment: LUE deficits/detail LUE Deficits / Details: Pt lacks active and passive shoulder flexion, abduction, and ER with firm end feel at ~ 60 degrees flexion/abduction. Active and passive shoulder movement painful with active>passive.     Lower Extremity Assessment Lower Extremity Assessment: Generalized weakness    Cervical / Trunk Assessment Cervical / Trunk Assessment: Kyphotic  Communication   Communication: No difficulties  Cognition Arousal/Alertness: Awake/alert Behavior During Therapy: WFL for tasks assessed/performed Overall Cognitive Status: Within Functional Limits for tasks assessed                                 General Comments: Pt very pleasant      General Comments      Exercises General Exercises - Upper Extremity Shoulder Flexion: (L Shoulder flexion exercises attempted but aggravated pt pain) Shoulder ABduction: (L Shoulder abduction exercises attempted but aggravated pt pain)   Assessment/Plan    PT Assessment Patient needs continued PT services  PT Problem List Decreased strength;Decreased range of motion;Decreased activity tolerance;Decreased balance;Decreased mobility;Decreased knowledge of use of DME;Pain       PT Treatment Interventions DME instruction;Gait training;Functional mobility training;Stair training;Therapeutic activities;Therapeutic exercise;Balance training;Neuromuscular re-education;Patient/family education    PT Goals (Current goals can be found in the Care Plan section)  Acute Rehab PT Goals Patient Stated Goal: Pt wants to go home independently PT Goal Formulation: With patient Time For Goal Achievement: 07/08/17 Potential to Achieve Goals: Fair    Frequency Min 3X/week   Barriers to discharge Other (comment) Pt  wishes to go home independently although 24 hr support is available per pt report. 24 hour supervision currently recommended due to mobility deficits    Co-evaluation               AM-PAC PT "6 Clicks" Daily Activity  Outcome Measure Difficulty turning over in bed (including adjusting bedclothes, sheets and blankets)?: None Difficulty moving from lying on back to sitting on the side of the bed? : A Lot Difficulty sitting down on and standing up from a chair with arms (e.g., wheelchair, bedside commode, etc,.)?: A Little Help needed moving to and from a bed to chair (including a wheelchair)?: A Little Help needed walking in hospital room?: A Little Help needed climbing 3-5 steps with a railing? : A Lot 6 Click Score: 17    End of Session Equipment Utilized During Treatment: Gait belt Activity Tolerance: Patient tolerated treatment well Patient left: in chair;with call bell/phone within reach Nurse Communication: Mobility status PT Visit Diagnosis: Unsteadiness on feet (R26.81);Muscle weakness (generalized) (M62.81);Other abnormalities of gait and mobility (R26.89)    Time: 3267-1245 PT Time Calculation (min) (ACUTE ONLY): 31 min   Charges:   PT Evaluation $PT Eval Moderate Complexity: 1 Mod PT Treatments $Gait Training: 8-22 mins   PT G Codes:        Gabe Thoams Siefert, SPT  [employer] 06/24/2017, 3:11 PM

## 2017-06-24 NOTE — Progress Notes (Signed)
Ms. Moncayo has quite a bit of mucositis on her lower lip.  This is causing her difficulties with eating.  She really cannot have anything solid.  She is using a straw to get in liquids.  I think this is a indicator that the methotrexate still is not out of her system yet.  Apparently, her methotrexate level came back quite low at 0.03.  She now back on acyclovir.  Her erythropoietin level is 258.  This is a bit higher than I.  Thought it would be however, I do not think it would be a bad idea to use Procrit.  There are no labs back yet.  Her hemoglobin was 7.3 yesterday.  We will have to see what the hemoglobin is today.  She continues on broad-spectrum antibiotic coverage.  She is not having diarrhea.  her T-max was 100.2 yesterday.  She is afebrile today.  Her lower lip is certainly quite affected by the mucositis.  This is scabbed over.  She has a eschar on the lower lip.  Again will have to see what her labs look like today.  If her platelet count is low or, we might want to consider Nplate.  She has a good attitude.  As always, we had a good prayer session.  Lattie Haw, MD  1 Timothy 5:23

## 2017-06-25 DIAGNOSIS — M25612 Stiffness of left shoulder, not elsewhere classified: Secondary | ICD-10-CM | POA: Diagnosis not present

## 2017-06-25 DIAGNOSIS — K9409 Other complications of colostomy: Secondary | ICD-10-CM | POA: Diagnosis not present

## 2017-06-25 DIAGNOSIS — K1230 Oral mucositis (ulcerative), unspecified: Secondary | ICD-10-CM | POA: Diagnosis not present

## 2017-06-25 DIAGNOSIS — D61810 Antineoplastic chemotherapy induced pancytopenia: Secondary | ICD-10-CM | POA: Diagnosis not present

## 2017-06-25 DIAGNOSIS — M069 Rheumatoid arthritis, unspecified: Secondary | ICD-10-CM | POA: Diagnosis not present

## 2017-06-25 DIAGNOSIS — K5792 Diverticulitis of intestine, part unspecified, without perforation or abscess without bleeding: Secondary | ICD-10-CM | POA: Diagnosis not present

## 2017-06-25 DIAGNOSIS — D702 Other drug-induced agranulocytosis: Secondary | ICD-10-CM

## 2017-06-25 DIAGNOSIS — Z933 Colostomy status: Secondary | ICD-10-CM | POA: Diagnosis not present

## 2017-06-25 LAB — CBC WITH DIFFERENTIAL/PLATELET
BASOS PCT: 0 %
Basophils Absolute: 0 10*3/uL (ref 0.0–0.1)
EOS PCT: 5 %
Eosinophils Absolute: 0.1 10*3/uL (ref 0.0–0.7)
HEMATOCRIT: 23.5 % — AB (ref 36.0–46.0)
HEMOGLOBIN: 7.4 g/dL — AB (ref 12.0–15.0)
Lymphocytes Relative: 74 %
Lymphs Abs: 1.2 10*3/uL (ref 0.7–4.0)
MCH: 27.9 pg (ref 26.0–34.0)
MCHC: 31.5 g/dL (ref 30.0–36.0)
MCV: 88.7 fL (ref 78.0–100.0)
MONO ABS: 0.2 10*3/uL (ref 0.1–1.0)
MONOS PCT: 12 %
Neutro Abs: 0.1 10*3/uL — ABNORMAL LOW (ref 1.7–7.7)
Neutrophils Relative %: 9 %
Platelets: 43 10*3/uL — ABNORMAL LOW (ref 150–400)
RBC: 2.65 MIL/uL — ABNORMAL LOW (ref 3.87–5.11)
RDW: 14.9 % (ref 11.5–15.5)
WBC: 1.6 10*3/uL — ABNORMAL LOW (ref 4.0–10.5)

## 2017-06-25 LAB — COMPREHENSIVE METABOLIC PANEL
ALBUMIN: 2.2 g/dL — AB (ref 3.5–5.0)
ALT: 14 U/L (ref 14–54)
AST: 16 U/L (ref 15–41)
Alkaline Phosphatase: 38 U/L (ref 38–126)
Anion gap: 4 — ABNORMAL LOW (ref 5–15)
BUN: 13 mg/dL (ref 6–20)
CHLORIDE: 111 mmol/L (ref 101–111)
CO2: 26 mmol/L (ref 22–32)
CREATININE: 0.97 mg/dL (ref 0.44–1.00)
Calcium: 8.4 mg/dL — ABNORMAL LOW (ref 8.9–10.3)
GFR calc Af Amer: >60 mL/min (ref >60)
GFR, EST NON AFRICAN AMERICAN: 58 mL/min — AB (ref >60)
GLUCOSE: 104 mg/dL — AB (ref 65–99)
Potassium: 3.7 mmol/L (ref 3.5–5.1)
SODIUM: 141 mmol/L (ref 135–145)
Total Bilirubin: 0.6 mg/dL (ref 0.3–1.2)
Total Protein: 5.6 g/dL — ABNORMAL LOW (ref 6.5–8.1)

## 2017-06-25 LAB — PATHOLOGIST SMEAR REVIEW

## 2017-06-25 LAB — RETICULOCYTES
RBC.: 2.65 MIL/uL — ABNORMAL LOW (ref 3.87–5.11)
RETIC COUNT ABSOLUTE: 23.9 10*3/uL (ref 19.0–186.0)
Retic Ct Pct: 0.9 % (ref 0.4–3.1)

## 2017-06-25 LAB — MAGNESIUM: Magnesium: 1.1 mg/dL — ABNORMAL LOW (ref 1.7–2.4)

## 2017-06-25 LAB — PHOSPHORUS: Phosphorus: 2.1 mg/dL — ABNORMAL LOW (ref 2.5–4.6)

## 2017-06-25 MED ORDER — ROMIPLOSTIM 250 MCG ~~LOC~~ SOLR
2.0000 ug/kg | Freq: Once | SUBCUTANEOUS | Status: AC
  Administered 2017-06-25: 240 ug via SUBCUTANEOUS
  Filled 2017-06-25: qty 0.48

## 2017-06-25 MED ORDER — MAGNESIUM SULFATE 4 GM/100ML IV SOLN
4.0000 g | Freq: Once | INTRAVENOUS | Status: AC
  Administered 2017-06-25: 4 g via INTRAVENOUS
  Filled 2017-06-25: qty 100

## 2017-06-25 MED ORDER — FILGRASTIM 480 MCG/1.6ML IJ SOLN
780.0000 ug | Freq: Every day | INTRAMUSCULAR | Status: DC
  Administered 2017-06-25 – 2017-06-27 (×3): 780 ug via SUBCUTANEOUS
  Filled 2017-06-25 (×5): qty 3.2

## 2017-06-25 MED ORDER — POTASSIUM PHOSPHATE MONOBASIC 500 MG PO TABS
500.0000 mg | ORAL_TABLET | ORAL | Status: DC
  Filled 2017-06-25 (×4): qty 1

## 2017-06-25 MED ORDER — POTASSIUM PHOSPHATE MONOBASIC 500 MG PO TABS: 500.0000 mg | ORAL_TABLET | ORAL | Status: DC

## 2017-06-25 MED ORDER — K PHOS MONO-SOD PHOS DI & MONO 155-852-130 MG PO TABS
500.0000 mg | ORAL_TABLET | ORAL | Status: AC
  Administered 2017-06-25 (×3): 500 mg via ORAL
  Filled 2017-06-25 (×3): qty 2

## 2017-06-25 NOTE — Progress Notes (Signed)
It looks like Ms. Reisch' lower lip is getting a little bit better.  She still is having a tough time eating.  She is on a soft diet now.  Her blood count still are not improving.  Her platelet count is 24,000.  I will give her a dose of Nplate.  Her white cell count is 1000.  I will increase her dose of Neupogen.  Her hemoglobin is 7.4.  She has not had a fever.  Cultures were all negative.  Her abdominal discomfort is improving.  She has had no sweats.  She has had no diarrhea.  There has been no obvious bleeding.  Again, her lower lip seems to be getting a little bit better.  The mucositis does not appear to be as extensive.  She is still on broad-spectrum antibiotic coverage.  I would continue this for now.  Her reticulocyte count was up just a little bit yesterday.  May be, this could be an indicator that the bone marrow might be starting to recover.  Lattie Haw, MD  Psalms 46:10

## 2017-06-25 NOTE — Progress Notes (Signed)
Family Medicine Teaching Service Daily Progress Note Intern Pager: [phone]  Patient name: [name] record number: [id number] Date of birth: 24-Jan-1946 Age: 72 y.o. Gender: female  Primary Care Provider: Dixie Dials, MD Consultants: Hemonc, Wound care Code Status: Full  Pt Overview and Major Events to Date:  5/23 Admitted for fever and pancytopenia found to have diverticulitis on CT abd/pelvis 5/24 Found to have methotrexate toxicity, transfused 1u RBC 5/27 Initiated Neupogen per Dr. Marin Olp recs ([place]) 5/28 Started EPO 5/29 First measurable reticulocytes on labs  Assessment and Plan: [name] is a 72 y.o. female presenting with diverticulitis and pancytopenia secondary to methotrexate toxicity.PMH is significant for rheumatoid arthritis, h/o chronic diverticulitis with colostomy, hypothyroidism, HTN, HLD, CAD, peripheral edema.   Pancytopenia  Methotrexate toxicity: IMPROVING WBC 1.6, Hgb 7.4, platelets 43, retic now measruable at 0.9%/23.9 absolute. Methotrexate level was 0.03 but unsure how long after ingestion that lab actually was drawn given patient's story. - Hematology-oncology (Dr. Marin Olp) consulted, appreciate recommendations - daily Neupogen - Daily folic acid supplementation - Daily CBC with differential - no leucovorin given low methotrexate lab -epo 100 weekly until recovered - current transfusion threshold is 7.0 per Dr. Marin Olp -per derm recommendation, offer separate lidocaine/maalox/[place] mouth washes -isolation precautions  Hypokalemia: 3.7, s/p 80 kdur 5/29 -daily BMP  Hypomagnesemia: 1.1 -4g IV -recheck in AM  Hypophosphatemia: 2.1 -Kphos tabs x3 -recheck in AM   Stomatitis with ulceration:  Still painful. Secondary to methotrexate toxicity. - Wound care consulted, appreciate recommendations (will need [place] nurse for stoma care) - Acyclovir (5/25-) - Continue oral hygiene  Shoulder pain: acutely increased over chronic, XR  showed no joint narrowing and only mild glenohumeral spurring -will continue PT, may get injection when not neutropenic   Acute on chronic diverticulitis Stable.  Confirmed acute diverticulitis on CT abdomen/pelvis upon arrival.  Stoma site with significant lesions per wound care.  Patient continues to produce stool without vomiting.  No signs of acute abdomen. - Continue IV Cipro and Flagyl (5/23-) - Soft diet (does not want advancing as she thinks regular would be hard on her mouth)   Ventral Hernia: Chronic.  Large without dilation of GI tract or signs of obstruction. - Continue monitoring bowel movements daily     Left lower extremity erythema: resolved to symetric swelling with left leg, warmth resolved.   - Continue monitoring and antibiotics per above, problem #2  Anemia of chronic inflammation:  On iron tablets at home.  Iron studies support adequate iron stores and high saturation ratio.  Ferritin elevated 431. - Holding home ferrous sulfate 325 mg daily   Rheumatoid arthritis: Chronic.  Stable.  Recently switched rheumatologist and patient is on methotrexate.  Patient presenting with toxicity due to taking daily methotrexate. - Holding home methotrexate 2.5 mg every Friday in light of toxicity, see plan #1 above - Continue folate supplementation    Hypothyroidism: Chronic.  On Synthroid at home. - Continue home Synthroid 50 mcg daily    Hyperlipidemia: Chronic.  On simvastatin and daily aspirin at home. - Continue home simvastatin 40 mg daily - Holding home aspirin 325 mg daily due to pancytopenia and diverticulitis    Chronic pain:  Medications include Norco 10-325 every 4 hours as needed and NSAIDs. - Tylenol 650 g every 6 hours as needed for mild pain, ibuprofen 600 mg every 6 hours as needed for moderate pain - Continue Norco 10-325 tablet every 4 hours as needed - Holding home naproxen 440 mg twice  daily as needed  FEN/GI: Soft diet (does not want  regular) Prophylaxis: SCD d/t pancytopenia  Disposition: med-surg, isolation precautions  significant neutropenia  Subjective:  Feeling slightly improved, optimistic about blood counts recovering  Objective: Temp:  [98.2 F (36.8 C)-100.1 F (37.8 C)] 98.6 F (37 C) (05/30 0300) Pulse Rate:  [81-89] 89 (05/30 0300) Resp:  [16-18] 16 (05/30 0300) BP: (136-147)/(55-73) 139/60 (05/30 0300) SpO2:  [93 %-100 %] 96 % (05/30 0300) Physical Exam: General: uncomfortable, but stable and conversational ENTM: still with bloody crusting along lower lip/mucosa Cardiovascular: RRR, 1/6 murmur, 1+ bilateral edema Respiratory: CTAB, no IWB Gastrointestinal: soft, no TTP, excoriated stoma MSK: L shoulder pain still Neuro: no CN deficits noted Psych: AOx3 appropriate thoughts, pleasant  Laboratory: Recent Labs  Lab 06/22/17 0411 06/23/17 0535 06/24/17 0636  WBC 1.1* 1.2* 1.0*  HGB 7.9* 7.3* 7.4*  HCT 25.3* 23.2* 23.5*  PLT 47* 30* 24*   Recent Labs  Lab 06/19/17 0438  06/22/17 0411 06/23/17 0535 06/24/17 0636  NA 143   < > 138 137 141  K 4.1   < > 3.4* 3.2* 2.9*  CL 111   < > 104 101 103  CO2 24   < > _0 BUN 15   < > 8 7 5*  CREATININE 0.89   < > 0.93 1.05* 0.98  CALCIUM 8.9   < > 8.4* 8.2* 8.5*  PROT 5.9*  --   --  5.5* 5.7*  BILITOT 1.3*  --   --  0.7 0.5  ALKPHOS 39  --   --  39 36*  ALT 15  --   --  20 17  AST 22  --   --  29 24  GLUCOSE 99   < > 94 100* 110*   < > = values in this interval not displayed.   5/29 Reticulocytes finally measurable 0.05%, absolute 13.4 5/25- 28 Reticulocytes <0.4%  5/25 Erythropoietin pending 5/24 Blood culture x2 NGTD 5/24 Folate pending 5/24 Vitamin B12 191 (low end of normal) 5/24 Iron and TIBC/ferritin (anemia of chronic inflammation) 5/24 Methotrexate pending 5/24 HIV antibody nonreactive 5/23 Urinalysis with microscopy cloudy, small bilirubin, moderate leuks 5/23 I-STAT lactic acid 1.01 (WNL)  Imaging/Diagnostic  Tests: CT ABDOMEN AND PELVIS WITH CONTRAST (06/18/2017) 1. Acute diverticulitis of a portion of the proximal colon contained within the ventral abdominal hernia. No abscess or free intraperitoneal air. 2. Large ventral abdominal hernia containing small bowel and colon without obstruction. 3. 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 No obstruction. 4.  Aortic Atherosclerosis.  PORTABLE CHEST 1 VIEW (06/18/2017) Given rightward rotated position, no acute cardiopulmonary process.  Sherene Sires, DO 06/25/2017, 6:20 AM PGY-1, [name] Intern pager: [phone], text pages welcome

## 2017-06-25 NOTE — Evaluation (Signed)
Occupational Therapy Evaluation Patient Details Name: [name] MRN: [id number] DOB: 1945-08-28 Today's Date: 06/25/2017    History of Present Illness [name] is a 72 y.o. female presenting with diverticulitis and pancytopenia secondary to methotrexate toxicity.PMH is significant for rheumatoid arthritis, h/o chronic diverticulitis with colostomy, hypothyroidism, HTN, HLD, CAD, peripheral edema.   Clinical Impression   Pt admitted with the above diagnoses and presents with below problem list. Pt will benefit from continued acute OT to address the below listed deficits and maximize independence with basic ADLs prior to d/c home. PTA pt was mod I with ADLs. Pt is currently min guard to min A with ADLs and functional mobility.       Follow Up Recommendations  Home health OT    Equipment Recommendations  None recommended by OT    Recommendations for Other Services       Precautions / Restrictions Precautions Precautions: Other (comment);Fall(Protective) Precaution Comments: Fall Precaution: Pt lacks stability with gait and becomes fatigued with activity Restrictions Weight Bearing Restrictions: No      Mobility Bed Mobility Overal bed mobility: Needs Assistance Bed Mobility: Supine to Sit;Sit to Supine     Supine to sit: Min guard Sit to supine: Min guard   General bed mobility comments: extra time and effort, HOB flat. + bed rails  Transfers Overall transfer level: Needs assistance Equipment used: Rolling walker (2 wheeled) Transfers: Sit to/from Stand Sit to Stand: Min guard         General transfer comment: Min guard for safety with cues to push up from bed    Balance Overall balance assessment: Needs assistance Sitting-balance support: Feet supported;No upper extremity supported Sitting balance-Leahy Scale: Fair Sitting balance - Comments: Pt able to sit EOB without UE but appeared fatigued doing so   Standing balance support: No upper extremity  supported Standing balance-Leahy Scale: Fair Standing balance comment: Pt able to stand at sink and wash hands without UE support but difficulty with reaching for paper towels and required assistance                           ADL either performed or assessed with clinical judgement   ADL Overall ADL's : Needs assistance/impaired Eating/Feeding: Set up;Sitting   Grooming: Set up;Sitting   Upper Body Bathing: Set up;Sitting;Minimal assistance   Lower Body Bathing: Min guard;Sit to/from stand;Minimal assistance   Upper Body Dressing : Minimal assistance;Sitting   Lower Body Dressing: Minimal assistance;Sit to/from stand   Toilet Transfer: Min guard;Minimal assistance;Ambulation;RW   Toileting- Water quality scientist and Hygiene: Min guard;Sit to/from stand   Tub/ Banker: Tub transfer;Minimal assistance   Functional mobility during ADLs: Min guard;Minimal assistance;Rolling walker General ADL Comments: Pt completed bed mobility, inroom functional mobility, toilet transfer and pericare     Vision         Perception     Praxis      Pertinent Vitals/Pain Pain Assessment: Faces Faces Pain Scale: Hurts even more Pain Location: L shoulder Pain Descriptors / Indicators: Sharp Pain Intervention(s): Limited activity within patient's tolerance;Monitored during session;Repositioned     Hand Dominance Right   Extremity/Trunk Assessment Upper Extremity Assessment Upper Extremity Assessment: LUE deficits/detail LUE Deficits / Details: With pt in supine, initilly OT performing PROM shoulder flexion progressing to AAROM and 1-2 reps AROM. End range ~90*   Lower Extremity Assessment Lower Extremity Assessment: Defer to PT evaluation   Cervical / Trunk Assessment Cervical / Trunk Assessment:  Kyphotic   Communication Communication Communication: No difficulties   Cognition Arousal/Alertness: Awake/alert Behavior During Therapy: WFL for tasks  assessed/performed Overall Cognitive Status: Within Functional Limits for tasks assessed                                 General Comments: Pt very pleasant   General Comments       Exercises General Exercises - Upper Extremity Shoulder Flexion: PROM;AROM;AAROM;Left;10 reps;Supine   Shoulder Instructions      Home Living Family/patient expects to be discharged to:: Private residence Living Arrangements: Alone Available Help at Discharge: Family;Available 24 hours/day Type of Home: Apartment Home Access: Level entry     Home Layout: One level     Bathroom Shower/Tub: Teacher, early years/pre: (uses BSC over regular toilet)     Home Equipment: Bedside commode;Walker - 2 wheels;Cane - single point;Other (comment)   Additional Comments: Uses cane in LUE most of the time but in RUE more recently due to recent L shoulder pain      Prior Functioning/Environment Level of Independence: Independent with assistive device(s)        Comments: Pt mainly stays at home with someone transporting her to doctors appts. No falls in last year        OT Problem List: Decreased activity tolerance;Impaired balance (sitting and/or standing);Decreased knowledge of use of DME or AE;Decreased knowledge of precautions;Impaired UE functional use;Pain      OT Treatment/Interventions: Self-care/ADL training;Therapeutic exercise;DME and/or AE instruction;Therapeutic activities;Patient/family education;Balance training    OT Goals(Current goals can be found in the care plan section) Acute Rehab OT Goals Patient Stated Goal: Pt wants to go home independently OT Goal Formulation: With patient Time For Goal Achievement: 07/02/17 Potential to Achieve Goals: Good ADL Goals Pt Will Perform Grooming: with modified independence;sitting Pt Will Perform Upper Body Bathing: with modified independence;sitting Pt Will Perform Lower Body Bathing: with modified independence;sit to/from  stand Pt Will Perform Tub/Shower Transfer: with modified independence;ambulating;rolling walker  OT Frequency: Min 2X/week   Barriers to D/C:            Co-evaluation              AM-PAC PT "6 Clicks" Daily Activity     Outcome Measure Help from another person eating meals?: None Help from another person taking care of personal grooming?: A Little Help from another person toileting, which includes using toliet, bedpan, or urinal?: A Little Help from another person bathing (including washing, rinsing, drying)?: A Little Help from another person to put on and taking off regular upper body clothing?: A Little Help from another person to put on and taking off regular lower body clothing?: A Little 6 Click Score: 19   End of Session Equipment Utilized During Treatment: Rolling walker  Activity Tolerance: Patient tolerated treatment well;Patient limited by fatigue Patient left: in bed;with call bell/phone within reach;with bed alarm set  OT Visit Diagnosis: Unsteadiness on feet (R26.81);Pain Pain - Right/Left: Left Pain - part of body: Shoulder                Time: 1315-1340 OT Time Calculation (min): 25 min Charges:  OT General Charges $OT Visit: 1 Visit OT Evaluation $OT Eval Low Complexity: 1 Low OT Treatments $Self Care/Home Management : 8-22 mins G-Codes:       Hortencia Pilar 06/25/2017, 1:56 PM

## 2017-06-25 NOTE — Consult Note (Signed)
WOC follow-up: Ostomy pouch intact with good seal, was applied yesterday afternoon by bedside nurse.  Supplies at bedside and instructions have been provided for staff PRN if leakage occurs.   [place] team will re-assess tomorrow. Julien Girt MSN, RN, [place], [place], [place]

## 2017-06-25 NOTE — Progress Notes (Signed)
Physical Therapy Treatment Patient Details Name: [name] MRN: [id number] DOB: 1945/04/18 Today's Date: 06/25/2017    History of Present Illness [name] is a 72 y.o. female presenting with diverticulitis and pancytopenia secondary to methotrexate toxicity.PMH is significant for rheumatoid arthritis, h/o chronic diverticulitis with colostomy, hypothyroidism, HTN, HLD, CAD, peripheral edema.    PT Comments    Patient is making gradual progress toward PT goals. Pt was able to ambulate 61f with RW and with standing rest breaks and min guard assist. Pt will continue to benefit from skilled PT services to maximize independence and safety with mobility. Current plan remains appropriate.    Follow Up Recommendations  Supervision/Assistance - 24 hour;Home health PT     Equipment Recommendations  None recommended by PT    Recommendations for Other Services OT consult     Precautions / Restrictions Precautions Precautions: Other (comment);Fall(Protective) Precaution Comments: Fall Precaution: Pt lacks stability with gait and becomes fatigued with activity Restrictions Weight Bearing Restrictions: No    Mobility  Bed Mobility Overal bed mobility: Needs Assistance Bed Mobility: Sit to Supine     Supine to sit: Min guard Sit to supine: Min guard   General bed mobility comments: extra time and effort and use of rail  Transfers Overall transfer level: Needs assistance Equipment used: Rolling walker (2 wheeled) Transfers: Sit to/from Stand Sit to Stand: Min guard         General transfer comment: Min guard for safety with cues for safe hand placement from recliner  Ambulation/Gait Ambulation/Gait assistance: Min guard Ambulation Distance (Feet): 90 Feet Assistive device: Rolling walker (2 wheeled) Gait Pattern/deviations: Step-through pattern;Decreased stride length;Wide base of support(lateral sway)     General Gait Details: cues for posture and safe use of AD; pt  fatigued and SOB with mobility adn required a few brief standing rest breaks   Stairs             Wheelchair Mobility    Modified Rankin (Stroke Patients Only)       Balance Overall balance assessment: Needs assistance Sitting-balance support: Feet supported;No upper extremity supported Sitting balance-Leahy Scale: Fair Sitting balance - Comments: Pt able to sit EOB without UE but appeared fatigued doing so   Standing balance support: No upper extremity supported Standing balance-Leahy Scale: Fair Standing balance comment: Pt able to stand at sink and wash hands without UE support but difficulty with reaching for paper towels and required assistance                            Cognition Arousal/Alertness: Awake/alert Behavior During Therapy: WFL for tasks assessed/performed Overall Cognitive Status: Within Functional Limits for tasks assessed                                 General Comments: Pt very pleasant      Exercises General Exercises - Upper Extremity Shoulder Flexion: PROM;AROM;AAROM;Left;10 reps;Supine General Exercises - Lower Extremity Long Arc Quad: AROM;Both;10 reps Hip Flexion/Marching: AROM;Both;10 reps Toe Raises: AROM;Both;10 reps Heel Raises: AROM;Both;10 reps    General Comments        Pertinent Vitals/Pain Pain Assessment: Faces Faces Pain Scale: Hurts little more Pain Location: L shoulder Pain Descriptors / Indicators: Aching Pain Intervention(s): Limited activity within patient's tolerance;Monitored during session;Repositioned    Home Living Family/patient expects to be discharged to:: Private residence Living Arrangements: Alone Available Help  at Discharge: Family;Available 24 hours/day Type of Home: Apartment Home Access: Level entry   Home Layout: One level Home Equipment: Bedside commode;Walker - 2 wheels;Cane - single point;Other (comment) Additional Comments: Uses cane in LUE most of the time but in  RUE more recently due to recent L shoulder pain    Prior Function Level of Independence: Independent with assistive device(s)      Comments: Pt mainly stays at home with someone transporting her to doctors appts. No falls in last year   PT Goals (current goals can now be found in the care plan section) Acute Rehab PT Goals Patient Stated Goal: Pt wants to go home independently Progress towards PT goals: Progressing toward goals    Frequency    Min 3X/week      PT Plan Current plan remains appropriate    Co-evaluation              AM-PAC PT "6 Clicks" Daily Activity  Outcome Measure  Difficulty turning over in bed (including adjusting bedclothes, sheets and blankets)?: None Difficulty moving from lying on back to sitting on the side of the bed? : A Lot Difficulty sitting down on and standing up from a chair with arms (e.g., wheelchair, bedside commode, etc,.)?: A Lot Help needed moving to and from a bed to chair (including a wheelchair)?: A Little Help needed walking in hospital room?: A Little Help needed climbing 3-5 steps with a railing? : A Lot 6 Click Score: 16    End of Session Equipment Utilized During Treatment: Gait belt Activity Tolerance: Patient tolerated treatment well Patient left: with call bell/phone within reach;in bed;with bed alarm set Nurse Communication: Mobility status PT Visit Diagnosis: Unsteadiness on feet (R26.81);Muscle weakness (generalized) (M62.81);Other abnormalities of gait and mobility (R26.89)     Time: 7543-6067 PT Time Calculation (min) (ACUTE ONLY): 23 min  Charges:  $Gait Training: 8-22 mins $Therapeutic Exercise: 8-22 mins                    G Codes:       Earney Navy, PTA Pager: [phone]     Darliss Cheney 06/25/2017, 2:50 PM

## 2017-06-26 DIAGNOSIS — D702 Other drug-induced agranulocytosis: Secondary | ICD-10-CM | POA: Diagnosis not present

## 2017-06-26 DIAGNOSIS — K9409 Other complications of colostomy: Secondary | ICD-10-CM | POA: Diagnosis not present

## 2017-06-26 DIAGNOSIS — Z933 Colostomy status: Secondary | ICD-10-CM | POA: Diagnosis not present

## 2017-06-26 DIAGNOSIS — T451X1D Poisoning by antineoplastic and immunosuppressive drugs, accidental (unintentional), subsequent encounter: Secondary | ICD-10-CM | POA: Diagnosis not present

## 2017-06-26 DIAGNOSIS — K1230 Oral mucositis (ulcerative), unspecified: Secondary | ICD-10-CM | POA: Diagnosis not present

## 2017-06-26 DIAGNOSIS — M069 Rheumatoid arthritis, unspecified: Secondary | ICD-10-CM | POA: Diagnosis not present

## 2017-06-26 DIAGNOSIS — D61810 Antineoplastic chemotherapy induced pancytopenia: Secondary | ICD-10-CM | POA: Diagnosis not present

## 2017-06-26 DIAGNOSIS — K5792 Diverticulitis of intestine, part unspecified, without perforation or abscess without bleeding: Secondary | ICD-10-CM | POA: Diagnosis not present

## 2017-06-26 LAB — CBC WITH DIFFERENTIAL/PLATELET
BASOS ABS: 0 10*3/uL (ref 0.0–0.1)
Basophils Relative: 0 %
EOS ABS: 0.1 10*3/uL (ref 0.0–0.7)
Eosinophils Relative: 2 %
HCT: 24.6 % — ABNORMAL LOW (ref 36.0–46.0)
HEMOGLOBIN: 7.6 g/dL — AB (ref 12.0–15.0)
LYMPHS PCT: 62 %
Lymphs Abs: 1.7 10*3/uL (ref 0.7–4.0)
MCH: 27.4 pg (ref 26.0–34.0)
MCHC: 30.9 g/dL (ref 30.0–36.0)
MCV: 88.8 fL (ref 78.0–100.0)
Monocytes Absolute: 0.3 10*3/uL (ref 0.1–1.0)
Monocytes Relative: 12 %
NEUTROS ABS: 0.6 10*3/uL — AB (ref 1.7–7.7)
NEUTROS PCT: 24 %
PLATELETS: 92 10*3/uL — AB (ref 150–400)
RBC: 2.77 MIL/uL — AB (ref 3.87–5.11)
RDW: 15.4 % (ref 11.5–15.5)
WBC: 2.7 10*3/uL — ABNORMAL LOW (ref 4.0–10.5)

## 2017-06-26 LAB — COMPREHENSIVE METABOLIC PANEL
ALBUMIN: 2.3 g/dL — AB (ref 3.5–5.0)
ALK PHOS: 39 U/L (ref 38–126)
ALT: 14 U/L (ref 14–54)
ANION GAP: 4 — AB (ref 5–15)
AST: 16 U/L (ref 15–41)
BUN: <5 mg/dL — ABNORMAL LOW (ref 6–20)
CALCIUM: 8.3 mg/dL — AB (ref 8.9–10.3)
CHLORIDE: 108 mmol/L (ref 101–111)
CO2: 27 mmol/L (ref 22–32)
Creatinine, Ser: 0.99 mg/dL (ref 0.44–1.00)
GFR calc non Af Amer: 56 mL/min — ABNORMAL LOW (ref >60)
GLUCOSE: 103 mg/dL — AB (ref 65–99)
POTASSIUM: 3.4 mmol/L — AB (ref 3.5–5.1)
SODIUM: 139 mmol/L (ref 135–145)
Total Bilirubin: 0.5 mg/dL (ref 0.3–1.2)
Total Protein: 5.5 g/dL — ABNORMAL LOW (ref 6.5–8.1)

## 2017-06-26 LAB — MAGNESIUM: Magnesium: 1.5 mg/dL — ABNORMAL LOW (ref 1.7–2.4)

## 2017-06-26 LAB — PHOSPHORUS: PHOSPHORUS: 3.5 mg/dL (ref 2.5–4.6)

## 2017-06-26 LAB — RETICULOCYTES
RBC.: 2.77 MIL/uL — AB (ref 3.87–5.11)
RETIC COUNT ABSOLUTE: 47.1 10*3/uL (ref 19.0–186.0)
RETIC CT PCT: 1.7 % (ref 0.4–3.1)

## 2017-06-26 MED ORDER — POTASSIUM CHLORIDE CRYS ER 20 MEQ PO TBCR
40.0000 meq | EXTENDED_RELEASE_TABLET | Freq: Once | ORAL | Status: AC
  Administered 2017-06-26: 40 meq via ORAL
  Filled 2017-06-26: qty 2

## 2017-06-26 MED ORDER — MAGNESIUM SULFATE 2 GM/50ML IV SOLN
2.0000 g | Freq: Once | INTRAVENOUS | Status: AC
  Administered 2017-06-26: 2 g via INTRAVENOUS
  Filled 2017-06-26: qty 50

## 2017-06-26 NOTE — Progress Notes (Signed)
Family Medicine Teaching Service Daily Progress Note Intern Pager: [phone]  Patient name: [name] record number: [id number] Date of birth: Nov 08, 1945 Age: 72 y.o. Gender: female  Primary Care Provider: Dixie Dials, MD Consultants: Hemonc, Wound care Code Status: Full  Pt Overview and Major Events to Date:  5/23 Admitted for fever and pancytopenia found to have diverticulitis on CT abd/pelvis 5/24 Found to have methotrexate toxicity, transfused 1u RBC 5/27 Initiated Neupogen per Dr. Marin Olp recs ([facility]) 5/28 Started EPO 5/29 First measurable reticulocytes on labs  Assessment and Plan: [name] is a 72 y.o. female presenting with diverticulitis and pancytopenia secondary to methotrexate toxicity.PMH is significant for rheumatoid arthritis, h/o chronic diverticulitis with colostomy, hypothyroidism, HTN, HLD, CAD, peripheral edema.   Pancytopenia  Methotrexate toxicity:IMPROVING WBC 2.7, Hgb 7.6, platelets 92, retic now measruable at 01.7%/47.1 absolute.  - Hematology-oncology (Dr. Marin Olp) consulted, appreciate recommendations - continue cipro/metro -D/c acyclovir/diflucan -daily Neupogen - Daily folic acid supplementation - Daily CBC with differential -epo 100 weekly until recovered - current transfusion threshold is 7.0 per Dr. Marin Olp -per derm recommendation, offer separate lidocaine/maalox/[place] mouth washes -isolation precautions  Hypokalemia: 3.4 -40 kdur -daily BMP  Hypomagnesemia:  1.5 (s/p 4G 5/30) -additional 2g IV -recheck in AM  Hypophosphatemia: resolved, s/p 3x kphos tabs   Stomatitis with ulceration:  Stable, Still painful. Secondary to methotrexate toxicity. - Wound care consulted, appreciate recommendations (will need [facility] nurse for stoma care) - d/c Acyclovir (5/25-31) - Continue oral hygiene  Shoulder pain: Stable acutely increased over chronic, XR showed no joint narrowing and only mild glenohumeral spurring -will continue PT,  may get injection when not neutropenic   Acute on chronic diverticulitis Stable.  Confirmed acute diverticulitis on CT abdomen/pelvis upon arrival.  Stoma site with significant lesions per wound care.  Patient continues to produce stool without vomiting.  No signs of acute abdomen. - Continue IV Cipro and Flagyl (5/23-) - Soft diet (does not want advancing as she thinks regular would be hard on her mouth)   Ventral Hernia: Chronic.  Large without dilation of GI tract or signs of obstruction. - Continue monitoring bowel movements daily  Anemia of chronic inflammation:  On iron tablets at home.  Iron studies support adequate iron stores and high saturation ratio.  Ferritin elevated 431. - Holding home ferrous sulfate 325 mg daily, will restart on d/c   Rheumatoid arthritis: Chronic.  Stable.  Recently switched rheumatologist and patient is on methotrexate.  Patient presenting with toxicity due to taking daily methotrexate. - Holding home methotrexate 2.5 mg every Friday in light of toxicity, see plan #1 above - Continue folate supplementation    Hypothyroidism: Chronic.  On Synthroid at home. - Continue home Synthroid 50 mcg daily    Hyperlipidemia: Chronic.  On simvastatin and daily aspirin at home. - Continue home simvastatin 40 mg daily - Holding home aspirin 325 mg daily due to pancytopenia and diverticulitis    Chronic pain:  Medications include Norco 10-325 every 4 hours as needed and NSAIDs. - Tylenol 650 g every 6 hours as needed for mild pain, ibuprofen 600 mg every 6 hours as needed for moderate pain - Continue Norco 10-325 tablet every 4 hours as needed - Holding home naproxen 440 mg twice daily as needed  FEN/GI: heart healthy diet Prophylaxis: SCD d/t pancytopenia (will start lovenox when platelets over 100)  Disposition: med-surg, isolation precautions  significant neutropenia.   Home health PT/OT ordered  Subjective:  Feeling improved, would like regular  diet  Objective: Temp:  [97.5 F (36.4 C)-98.7 F (37.1 C)] 98.5 F (36.9 C) (05/31 0405) Pulse Rate:  [77-92] 92 (05/31 0405) Resp:  [17-20] 18 (05/31 0405) BP: (100-156)/(56-87) 156/72 (05/31 0405) SpO2:  [89 %-95 %] 89 % (05/31 0405) Weight:  [266 lb 8.6 oz (120.9 kg)] 266 lb 8.6 oz (120.9 kg) (05/31 0405) Physical Exam: General: mouth less painful, cheerful ENTM: mouth sores healing Cardiovascular: RRR, 1/6 murmur Respiratory: CTAB, no wheezes/crackles Gastrointestinal: soft , no TTP, excoriated stoma MSK: shoulder pain improving slightyl Neuro: no CN deficits noted Psych: AOx3, appropriate  Laboratory: Recent Labs  Lab 06/24/17 0636 06/25/17 0644 06/26/17 0446  WBC 1.0* 1.6* 2.7*  HGB 7.4* 7.4* 7.6*  HCT 23.5* 23.5* 24.6*  PLT 24* 43* 92*   Recent Labs  Lab 06/24/17 0636 06/25/17 0644 06/26/17 0446  NA 141 141 139  K 2.9* 3.7 3.4*  CL 103 111 108  CO2 _0 BUN 5* 13 <5*  CREATININE 0.98 0.97 0.99  CALCIUM 8.5* 8.4* 8.3*  PROT 5.7* 5.6* 5.5*  BILITOT 0.5 0.6 0.5  ALKPHOS 36* 38 39  ALT _1 AST _2 GLUCOSE 110* 104* 103*   5/29 Reticulocytes finally measurable 0.05%, absolute 13.4 5/25- 28 Reticulocytes <0.4%  5/25 Erythropoietin pending 5/24 Blood culture x2 NGTD 5/24 Folate pending 5/24 Vitamin B12 191 (low end of normal) 5/24 Iron and TIBC/ferritin (anemia of chronic inflammation) 5/24 Methotrexate pending 5/24 HIV antibody nonreactive 5/23 Urinalysis with microscopy cloudy, small bilirubin, moderate leuks 5/23 I-STAT lactic acid 1.01 (WNL)  Imaging/Diagnostic Tests: CT ABDOMEN AND PELVIS WITH CONTRAST (06/18/2017) 1. Acute diverticulitis of a portion of the proximal colon contained within the ventral abdominal hernia. No abscess or free intraperitoneal air. 2. Large ventral abdominal hernia containing small bowel and colon without obstruction. 3. 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 No  obstruction. 4.  Aortic Atherosclerosis.  PORTABLE CHEST 1 VIEW (06/18/2017) Given rightward rotated position, no acute cardiopulmonary process.  Sherene Sires, DO 06/26/2017, 6:14 AM PGY-1, [place] Intern pager: [phone], text pages welcome

## 2017-06-26 NOTE — Consult Note (Addendum)
WOC follow-up: Ostomy pouch intact with good seal, was applied 5/29 by bedside nurse.  Pt declines offer to change today and states the skin underneath is much less painful. Supplies at bedside and instructions have been provided for staff PRN if leakage occurs. Julien Girt MSN, RN, [place], [place], [place]

## 2017-06-26 NOTE — Progress Notes (Signed)
It looks like this Ms. Egnor' blood counts are starting to get better.  Her white cell count is now 2.7.  Her platelet count is 92,000.  Her mucositis also is healing up.  It is not as extensive.  She is not eating all that much because she is not to happy with the quality of the food.  She has had no fever.  I would continue her on the Neupogen right now.  I would want to see her neutrophil count above 1500.  Currently, her neutrophil count is 600.  However, we are starting to see an increase in neutrophil percentage.  I know that she got a dose of Nplate yesterday.  I would not think that it would work so quickly.     Her electrolytes look okay.  I would think that we can start cutting back on some of the antibiotics.  I would stop the acyclovir.  I will stop the Diflucan.  Again, I would continue the Neupogen until her absolute neutrophil count is above 1500.  Hopefully, she will be able to go home soon.  This  Lattie Haw, MD  John 14:1

## 2017-06-26 NOTE — Progress Notes (Signed)
Physical Therapy Treatment Patient Details Name: [name] MRN: [id number] DOB: 1945-10-16 Today's Date: 06/26/2017    History of Present Illness [name] is a 72 y.o. female presenting with diverticulitis and pancytopenia secondary to methotrexate toxicity.PMH is significant for rheumatoid arthritis, h/o chronic diverticulitis with colostomy, hypothyroidism, HTN, HLD, CAD, peripheral edema.    PT Comments    Patient continues to demonstrate generalized weakness and activity tolerance however was able to ambulate 130f with RW and brief standing rest breaks. Pt reported feeling tired and wished to return to bed end of session. Continue to progress as tolerated.   Follow Up Recommendations  Supervision/Assistance - 24 hour;Home health PT     Equipment Recommendations  None recommended by PT    Recommendations for Other Services OT consult     Precautions / Restrictions Precautions Precautions: Other (comment);Fall(Protective) Restrictions Weight Bearing Restrictions: No    Mobility  Bed Mobility Overal bed mobility: Needs Assistance Bed Mobility: Sit to Supine;Supine to Sit     Supine to sit: Min guard Sit to supine: Min guard   General bed mobility comments: extra time and effort and use of rail  Transfers Overall transfer level: Needs assistance Equipment used: Rolling walker (2 wheeled) Transfers: Sit to/from Stand Sit to Stand: Min guard         General transfer comment: increased effort to stand; min guard for safety  Ambulation/Gait Ambulation/Gait assistance: Min guard Ambulation Distance (Feet): 100 Feet Assistive device: Rolling walker (2 wheeled) Gait Pattern/deviations: Step-through pattern;Decreased stride length;Wide base of support(lateral sway) Gait velocity: Decreased   General Gait Details: several brief standing rest breaks due to fatigue and SOB; cues for breathing technique    Stairs             Wheelchair Mobility     Modified Rankin (Stroke Patients Only)       Balance Overall balance assessment: Needs assistance Sitting-balance support: Feet supported;No upper extremity supported Sitting balance-Leahy Scale: Fair     Standing balance support: No upper extremity supported Standing balance-Leahy Scale: Fair                              Cognition Arousal/Alertness: Awake/alert Behavior During Therapy: WFL for tasks assessed/performed Overall Cognitive Status: Within Functional Limits for tasks assessed                                        Exercises      General Comments        Pertinent Vitals/Pain Pain Assessment: Faces Faces Pain Scale: Hurts little more Pain Location: L shoulder Pain Descriptors / Indicators: Aching Pain Intervention(s): Limited activity within patient's tolerance;Monitored during session;Repositioned    Home Living                      Prior Function            PT Goals (current goals can now be found in the care plan section) Acute Rehab PT Goals Patient Stated Goal: Pt wants to go home independently PT Goal Formulation: With patient Time For Goal Achievement: 07/08/17 Potential to Achieve Goals: Fair Progress towards PT goals: Progressing toward goals    Frequency    Min 3X/week      PT Plan Current plan remains appropriate    Co-evaluation  AM-PAC PT "6 Clicks" Daily Activity  Outcome Measure  Difficulty turning over in bed (including adjusting bedclothes, sheets and blankets)?: None Difficulty moving from lying on back to sitting on the side of the bed? : A Lot Difficulty sitting down on and standing up from a chair with arms (e.g., wheelchair, bedside commode, etc,.)?: A Lot Help needed moving to and from a bed to chair (including a wheelchair)?: A Little Help needed walking in hospital room?: A Little Help needed climbing 3-5 steps with a railing? : A Lot 6 Click Score:  16    End of Session Equipment Utilized During Treatment: Gait belt Activity Tolerance: Patient tolerated treatment well Patient left: with call bell/phone within reach;in bed;with bed alarm set Nurse Communication: Mobility status PT Visit Diagnosis: Unsteadiness on feet (R26.81);Muscle weakness (generalized) (M62.81);Other abnormalities of gait and mobility (R26.89)     Time: 4818-5631 PT Time Calculation (min) (ACUTE ONLY): 25 min  Charges:  $Gait Training: 8-22 mins $Therapeutic Activity: 8-22 mins                    G Codes:       Earney Navy, PTA Pager: [phone]     Darliss Cheney 06/26/2017, 2:48 PM

## 2017-06-26 NOTE — Care Management Note (Signed)
Case Management Note  Patient Details  Name: [name] MRN: [id number] Date of Birth: 1945/10/06  Subjective/Objective:                    Action/Plan: Pt with orders for [facility] services when medically ready for d/c. CM met with the patient and provided her choice. She has used [facility] in past and asked to use them again. [name] with [facility] notified and accepted the referral. No DME needs per therapy. Pt states her daughter is currently staying with her and can provide assistance at home. CM following.  Expected Discharge Date:                  Expected Discharge Plan:  Home/Self Care  In-House Referral:     Discharge planning Services  CM Consult  Post Acute Care Choice:  Home Health Choice offered to:  Patient  DME Arranged:    DME Agency:     HH Arranged:  PT, OT HH Agency:  [place]  Status of Service:  In process, will continue to follow  If discussed at Long Length of Stay Meetings, dates discussed:    Additional Comments:  Pollie Friar, RN 06/26/2017, 2:51 PM

## 2017-06-27 ENCOUNTER — Inpatient Hospital Stay (HOSPITAL_COMMUNITY): Payer: Medicare Other

## 2017-06-27 DIAGNOSIS — D704 Cyclic neutropenia: Secondary | ICD-10-CM | POA: Diagnosis not present

## 2017-06-27 DIAGNOSIS — D61818 Other pancytopenia: Secondary | ICD-10-CM | POA: Diagnosis not present

## 2017-06-27 DIAGNOSIS — R509 Fever, unspecified: Secondary | ICD-10-CM

## 2017-06-27 DIAGNOSIS — K5792 Diverticulitis of intestine, part unspecified, without perforation or abscess without bleeding: Secondary | ICD-10-CM | POA: Diagnosis not present

## 2017-06-27 DIAGNOSIS — D702 Other drug-induced agranulocytosis: Secondary | ICD-10-CM | POA: Diagnosis not present

## 2017-06-27 LAB — CBC WITH DIFFERENTIAL/PLATELET
Basophils Absolute: 0 10*3/uL (ref 0.0–0.1)
Basophils Relative: 0 %
Eosinophils Absolute: 0 10*3/uL (ref 0.0–0.7)
Eosinophils Relative: 0 %
HCT: 26.6 % — ABNORMAL LOW (ref 36.0–46.0)
Hemoglobin: 8.2 g/dL — ABNORMAL LOW (ref 12.0–15.0)
LYMPHS ABS: 2.1 10*3/uL (ref 0.7–4.0)
Lymphocytes Relative: 25 %
MCH: 27.2 pg (ref 26.0–34.0)
MCHC: 30.8 g/dL (ref 30.0–36.0)
MCV: 88.1 fL (ref 78.0–100.0)
MONO ABS: 1.3 10*3/uL — AB (ref 0.1–1.0)
MONOS PCT: 16 %
Neutro Abs: 5 10*3/uL (ref 1.7–7.7)
Neutrophils Relative %: 59 %
Platelets: 160 10*3/uL (ref 150–400)
RBC: 3.02 MIL/uL — AB (ref 3.87–5.11)
RDW: 15.8 % — AB (ref 11.5–15.5)
WBC: 8.4 10*3/uL (ref 4.0–10.5)

## 2017-06-27 LAB — URINALYSIS, ROUTINE W REFLEX MICROSCOPIC
Glucose, UA: NEGATIVE mg/dL
HGB URINE DIPSTICK: NEGATIVE
Ketones, ur: NEGATIVE mg/dL
Nitrite: NEGATIVE
Protein, ur: NEGATIVE mg/dL
Specific Gravity, Urine: 1.019 (ref 1.005–1.030)
pH: 5 (ref 5.0–8.0)

## 2017-06-27 LAB — COMPREHENSIVE METABOLIC PANEL
ALBUMIN: 2.2 g/dL — AB (ref 3.5–5.0)
ALT: 12 U/L — ABNORMAL LOW (ref 14–54)
ANION GAP: 9 (ref 5–15)
AST: 18 U/L (ref 15–41)
Alkaline Phosphatase: 43 U/L (ref 38–126)
BUN: <5 mg/dL — ABNORMAL LOW (ref 6–20)
CO2: 26 mmol/L (ref 22–32)
Calcium: 8.3 mg/dL — ABNORMAL LOW (ref 8.9–10.3)
Chloride: 104 mmol/L (ref 101–111)
Creatinine, Ser: 1.21 mg/dL — ABNORMAL HIGH (ref 0.44–1.00)
GFR calc Af Amer: 51 mL/min — ABNORMAL LOW (ref >60)
GFR calc non Af Amer: 44 mL/min — ABNORMAL LOW (ref >60)
GLUCOSE: 114 mg/dL — AB (ref 65–99)
POTASSIUM: 3.3 mmol/L — AB (ref 3.5–5.1)
SODIUM: 139 mmol/L (ref 135–145)
Total Bilirubin: 0.6 mg/dL (ref 0.3–1.2)
Total Protein: 5.6 g/dL — ABNORMAL LOW (ref 6.5–8.1)

## 2017-06-27 LAB — RETICULOCYTES
RBC.: 3.02 MIL/uL — ABNORMAL LOW (ref 3.87–5.11)
RETIC COUNT ABSOLUTE: 57.4 10*3/uL (ref 19.0–186.0)
Retic Ct Pct: 1.9 % (ref 0.4–3.1)

## 2017-06-27 LAB — MAGNESIUM: Magnesium: 1.6 mg/dL — ABNORMAL LOW (ref 1.7–2.4)

## 2017-06-27 IMAGING — DX DG CHEST 2V
2 series · 2 of 2 positions shown · non-contrast
Comparison: Single-view of the chest [DATE]. PA and lateral
chest [DATE].

CLINICAL DATA: Fever and shortness of breath.

EXAM:
CHEST - 2 VIEW

[chest lat]
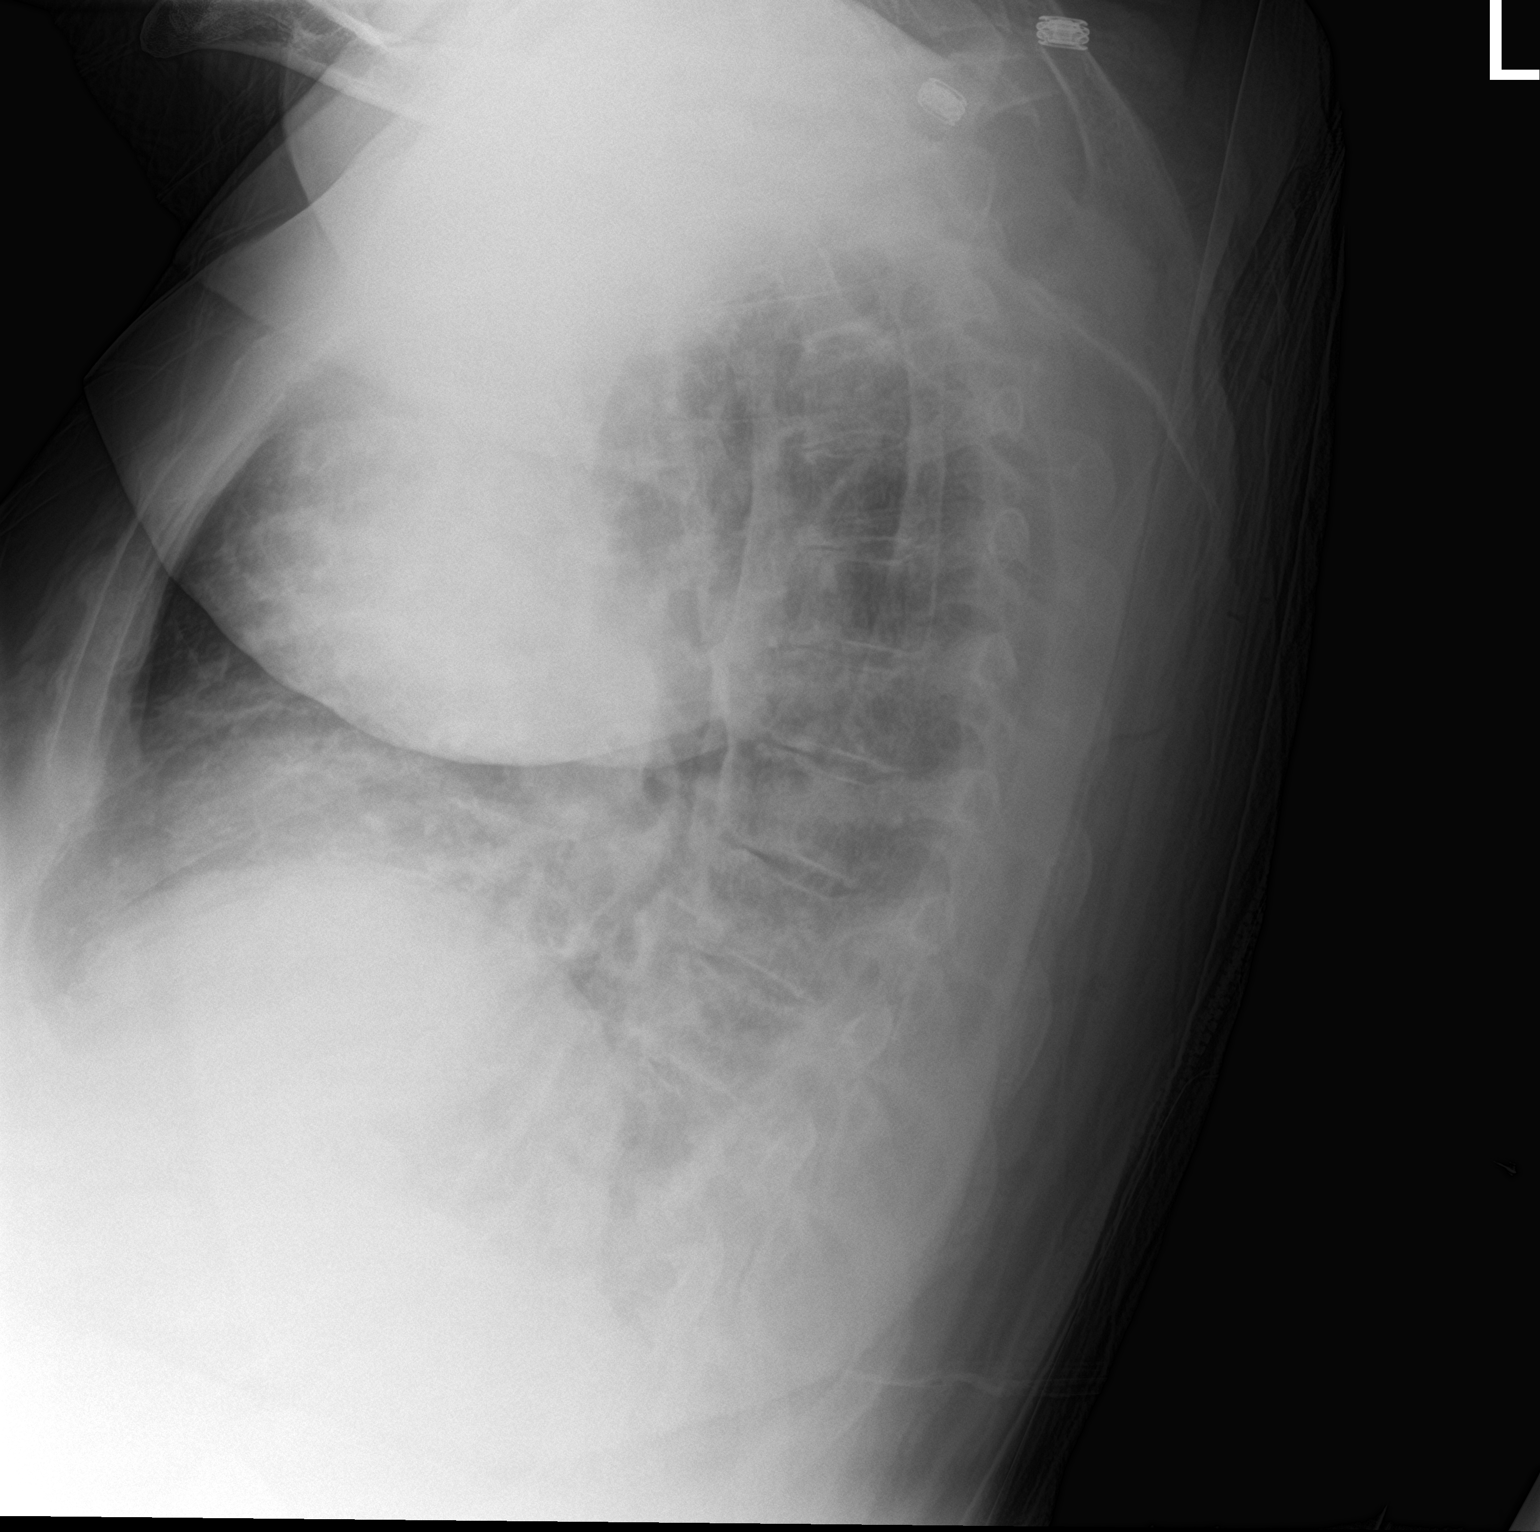

[chest ap]
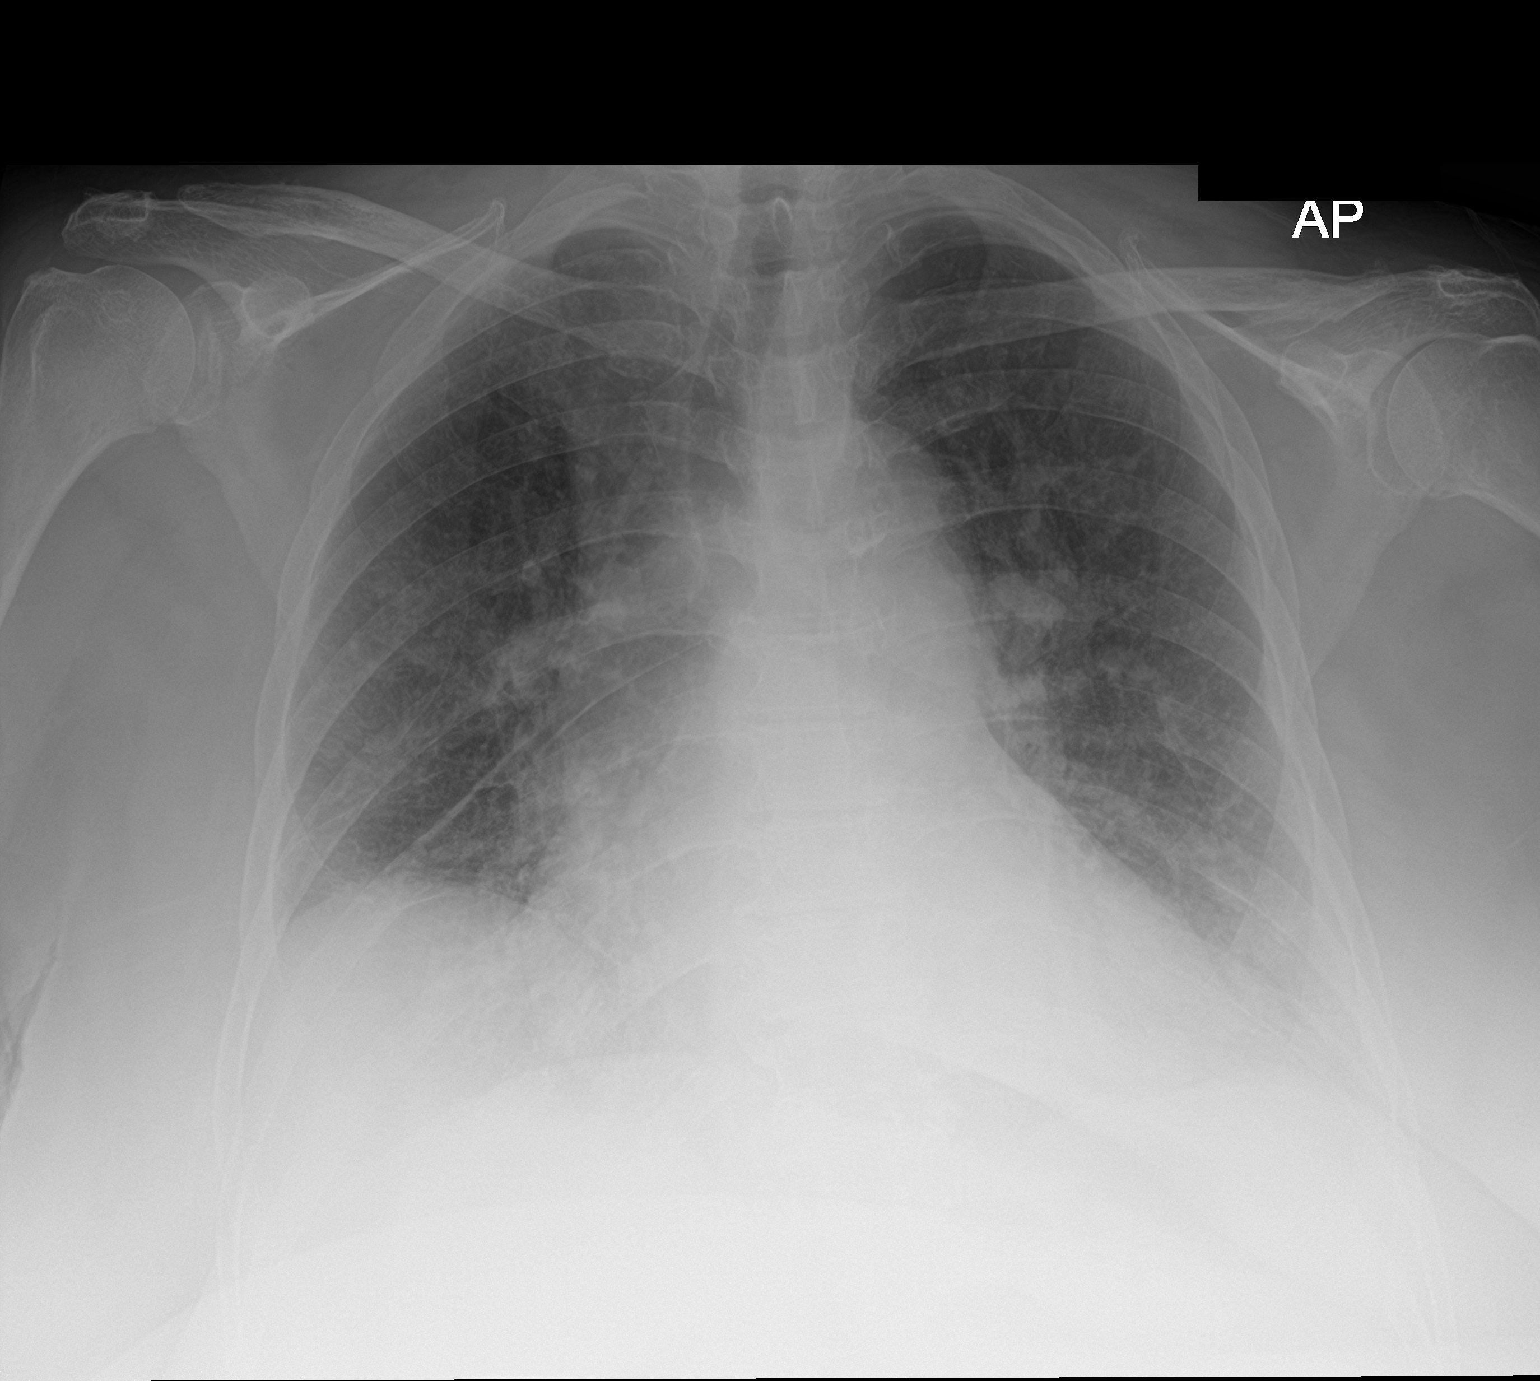

[2 of 2 positions shown; findings below may reference images not displayed]

FINDINGS: Elevation of the right hemidiaphragm relative to the left is again
seen. Bibasilar airspace disease is worse on the left. There is
cardiomegaly and interstitial edema. No pneumothorax. No pleural
effusion. Aortic atherosclerosis is noted. No acute bony
abnormality.
IMPRESSION: Pulmonary edema. Left worse than right basilar airspace disease
could be due to atelectasis, pneumonia or more intense pulmonary
edema.

Cardiomegaly.

Atherosclerosis.

## 2017-06-27 MED ORDER — POTASSIUM CHLORIDE CRYS ER 20 MEQ PO TBCR
40.0000 meq | EXTENDED_RELEASE_TABLET | Freq: Once | ORAL | Status: AC
  Administered 2017-06-27: 40 meq via ORAL
  Filled 2017-06-27: qty 2

## 2017-06-27 MED ORDER — ENOXAPARIN SODIUM 60 MG/0.6ML ~~LOC~~ SOLN
60.0000 mg | Freq: Two times a day (BID) | SUBCUTANEOUS | Status: DC
  Administered 2017-06-27 – 2017-06-28 (×3): 60 mg via SUBCUTANEOUS
  Filled 2017-06-27 (×3): qty 0.6

## 2017-06-27 MED ORDER — MAGNESIUM SULFATE 2 GM/50ML IV SOLN
2.0000 g | Freq: Once | INTRAVENOUS | Status: AC
  Administered 2017-06-27: 2 g via INTRAVENOUS
  Filled 2017-06-27: qty 50

## 2017-06-27 NOTE — Progress Notes (Signed)
Family Medicine Teaching Service Daily Progress Note Intern Pager: [phone]  Patient name: [name] record number: [id number] Date of birth: April 28, 1945 Age: 72 y.o. Gender: female  Primary Care Provider: Dixie Dials, MD Consultants: Hemonc, Wound care Code Status: Full  Pt Overview and Major Events to Date:  5/23 Admitted for fever and pancytopenia found to have diverticulitis on CT abd/pelvis 5/24 Found to have methotrexate toxicity, transfused 1u RBC 5/27 Initiated Neupogen per Dr. Marin Olp recs ([place]) 5/28 Started EPO 5/29 First measurable reticulocytes on labs  Assessment and Plan: [name] is a 72 y.o. female presenting with diverticulitis and pancytopenia secondary to methotrexate toxicity.PMH is significant for rheumatoid arthritis, h/o chronic diverticulitis with colostomy, hypothyroidism, HTN, HLD, CAD, peripheral edema.   Pancytopenia  Methotrexate toxicity:IMPROVING WBC 8.6, Hgb 8.2, platelets 160, retic now measruable at 1.9%/57.4 absolute.  - Hematology-oncology (Dr. Marin Olp) consulted, appreciate recommendations - continue cipro/metro -D/c acyclovir/diflucan -daily Neupogen - Daily folic acid supplementation - Daily CBC with differential -epo 100 weekly until recovered - current transfusion threshold is 7.0 per Dr. Marin Olp -per derm recommendation, offer separate lidocaine/maalox/[place] mouth washes -isolation precautions  Fever: Patient with fever to 102.3 overnight. She is certainly at higher risk of acquiring infection due to suppressed immune system. Patient now afebrile. UA does not appear infectious in nature.  -check blood cultures, urine culture  -CXR -continue Ciprofloxacin and Flagyl  -low threshold to broaden abx coverage   Hypokalemia: 3.3 -40 kdur -daily BMP  Hypomagnesemia:  1.5 on 5/31 (s/p 4G 5/30). Additional 2g IV given on 5/31.  -recheck Mg    Stomatitis with ulceration:  Stable, Still painful. Secondary to  methotrexate toxicity. - Wound care consulted, appreciate recommendations (will need [place] nurse for stoma care) - d/c Acyclovir (5/25-31) - Continue oral hygiene  Shoulder pain: Stable acutely increased over chronic, XR showed no joint narrowing and only mild glenohumeral spurring -will continue PT, may get injection outpatient    Acute on chronic diverticulitis Stable.  Confirmed acute diverticulitis on CT abdomen/pelvis upon arrival.  Stoma site with significant lesions per wound care.  Patient continues to produce stool without vomiting.  No signs of acute abdomen. - Continue IV Cipro and Flagyl (5/23-) - Soft diet (does not want advancing as she thinks regular would be hard on her mouth)   Ventral Hernia: Chronic.  Large without dilation of GI tract or signs of obstruction. - Continue monitoring bowel movements daily  Anemia of chronic inflammation:  On iron tablets at home.  Iron studies support adequate iron stores and high saturation ratio.  Ferritin elevated 431. - Holding home ferrous sulfate 325 mg daily, will restart on d/c   Rheumatoid arthritis: Chronic.  Stable.  Recently switched rheumatologist and patient is on methotrexate.  Patient presenting with toxicity due to taking daily methotrexate. - Holding home methotrexate 2.5 mg every Friday in light of toxicity, see plan #1 above - Continue folate supplementation    Hypothyroidism: Chronic.  On Synthroid at home. - Continue home Synthroid 50 mcg daily    Hyperlipidemia: Chronic.  On simvastatin and daily aspirin at home. - Continue home simvastatin 40 mg daily - Holding home aspirin 325 mg daily due to pancytopenia and diverticulitis    Chronic pain:  Medications include Norco 10-325 every 4 hours as needed and NSAIDs. - Tylenol 650 g every 6 hours as needed for mild pain, ibuprofen 600 mg every 6 hours as needed for moderate pain - Continue Norco 10-325 tablet every 4 hours  as needed - Holding home naproxen 440 mg  twice daily as needed  FEN/GI: heart healthy diet Prophylaxis: Start Lovenox now that pancytopenia has resolved   Disposition: med-surg, isolation precautions  significant neutropenia.   Home health PT/OT ordered  Subjective:  No complaints this morning. Reports her abdominal pain is much better than when she was admitted. She was not aware that she had a fever overnight. She denies cough, SOB, congestion, dysuria, chest pain, new skin lesions.   Objective: Temp:  [97.9 F (36.6 C)-102.3 F (39.1 C)] 97.9 F (36.6 C) (06/01 0837) Pulse Rate:  [86-102] 86 (06/01 0837) Resp:  [16-20] 20 (06/01 0837) BP: (110-169)/(55-77) 110/55 (06/01 0837) SpO2:  [81 %-95 %] 89 % (06/01 0837) Physical Exam: General:  pleasant female in NAD, cheerful ENTM: mouth sores present but improving  Cardiovascular: RRR, 1/6 murmur Respiratory: CTAB, no wheezes/crackles Gastrointestinal: soft , no TTP, excoriated stoma Neuro: no CN deficits noted Psych: AOx3, appropriate  Laboratory: Recent Labs  Lab 06/25/17 0644 06/26/17 0446 06/27/17 0621  WBC 1.6* 2.7* 8.4  HGB 7.4* 7.6* 8.2*  HCT 23.5* 24.6* 26.6*  PLT 43* 92* 160   Recent Labs  Lab 06/25/17 0644 06/26/17 0446 06/27/17 0621  NA 141 139 139  K 3.7 3.4* 3.3*  CL 111 108 104  CO2 _0 BUN 13 <5* <5*  CREATININE 0.97 0.99 1.21*  CALCIUM 8.4* 8.3* 8.3*  PROT 5.6* 5.5* 5.6*  BILITOT 0.6 0.5 0.6  ALKPHOS 38 39 43  ALT 14 14 12*  AST _1 GLUCOSE 104* 103* 114*   5/29 Reticulocytes finally measurable 0.05%, absolute 13.4 5/25- 28 Reticulocytes <0.4%  5/25 Erythropoietin pending 5/24 Blood culture x2 NGTD 5/24 Folate pending 5/24 Vitamin B12 191 (low end of normal) 5/24 Iron and TIBC/ferritin (anemia of chronic inflammation) 5/24 Methotrexate pending 5/24 HIV antibody nonreactive 5/23 Urinalysis with microscopy cloudy, small bilirubin, moderate leuks 5/23 I-STAT lactic acid 1.01 (WNL)  Imaging/Diagnostic Tests: CT  ABDOMEN AND PELVIS WITH CONTRAST (06/18/2017) 1. Acute diverticulitis of a portion of the proximal colon contained within the ventral abdominal hernia. No abscess or free intraperitoneal air. 2. Large ventral abdominal hernia containing small bowel and colon without obstruction. 3. 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 No obstruction. 4.  Aortic Atherosclerosis.  PORTABLE CHEST 1 VIEW (06/18/2017) Given rightward rotated position, no acute cardiopulmonary process.  Nicolette Bang, DO 06/27/2017, 8:41 AM PGY-3, [place] Intern pager: [phone], text pages welcome

## 2017-06-28 DIAGNOSIS — T451X1D Poisoning by antineoplastic and immunosuppressive drugs, accidental (unintentional), subsequent encounter: Secondary | ICD-10-CM | POA: Diagnosis not present

## 2017-06-28 DIAGNOSIS — K5792 Diverticulitis of intestine, part unspecified, without perforation or abscess without bleeding: Secondary | ICD-10-CM | POA: Diagnosis not present

## 2017-06-28 DIAGNOSIS — D702 Other drug-induced agranulocytosis: Secondary | ICD-10-CM | POA: Diagnosis not present

## 2017-06-28 LAB — CBC WITH DIFFERENTIAL/PLATELET
BAND NEUTROPHILS: 0 %
BASOS ABS: 0 10*3/uL (ref 0.0–0.1)
BLASTS: 0 %
Basophils Relative: 0 %
EOS ABS: 0 10*3/uL (ref 0.0–0.7)
Eosinophils Relative: 0 %
HCT: 26.3 % — ABNORMAL LOW (ref 36.0–46.0)
HEMOGLOBIN: 8.1 g/dL — AB (ref 12.0–15.0)
LYMPHS ABS: 3.8 10*3/uL (ref 0.7–4.0)
Lymphocytes Relative: 20 %
MCH: 27.5 pg (ref 26.0–34.0)
MCHC: 30.8 g/dL (ref 30.0–36.0)
MCV: 89.2 fL (ref 78.0–100.0)
METAMYELOCYTES PCT: 0 %
MYELOCYTES: 0 %
Monocytes Absolute: 1.1 10*3/uL — ABNORMAL HIGH (ref 0.1–1.0)
Monocytes Relative: 6 %
Neutro Abs: 13.2 10*3/uL — ABNORMAL HIGH (ref 1.7–7.7)
Neutrophils Relative %: 70 %
Other: 4 %
Platelets: 244 10*3/uL (ref 150–400)
Promyelocytes Relative: 0 %
RBC: 2.95 MIL/uL — AB (ref 3.87–5.11)
RDW: 16.4 % — AB (ref 11.5–15.5)
WBC: 18.8 10*3/uL — AB (ref 4.0–10.5)
nRBC: 2 /100 WBC — ABNORMAL HIGH

## 2017-06-28 LAB — URINE CULTURE: Culture: NO GROWTH

## 2017-06-28 LAB — COMPREHENSIVE METABOLIC PANEL
ALBUMIN: 2.1 g/dL — AB (ref 3.5–5.0)
ALT: 8 U/L — ABNORMAL LOW (ref 14–54)
ANION GAP: 8 (ref 5–15)
AST: 16 U/L (ref 15–41)
Alkaline Phosphatase: 57 U/L (ref 38–126)
BUN: 9 mg/dL (ref 6–20)
CHLORIDE: 104 mmol/L (ref 101–111)
CO2: 25 mmol/L (ref 22–32)
Calcium: 8.4 mg/dL — ABNORMAL LOW (ref 8.9–10.3)
Creatinine, Ser: 1.32 mg/dL — ABNORMAL HIGH (ref 0.44–1.00)
GFR calc Af Amer: 46 mL/min — ABNORMAL LOW (ref >60)
GFR calc non Af Amer: 40 mL/min — ABNORMAL LOW (ref >60)
Glucose, Bld: 113 mg/dL — ABNORMAL HIGH (ref 65–99)
POTASSIUM: 3.8 mmol/L (ref 3.5–5.1)
SODIUM: 137 mmol/L (ref 135–145)
Total Bilirubin: 0.5 mg/dL (ref 0.3–1.2)
Total Protein: 5.7 g/dL — ABNORMAL LOW (ref 6.5–8.1)

## 2017-06-28 MED ORDER — METRONIDAZOLE 500 MG PO TABS
500.0000 mg | ORAL_TABLET | Freq: Three times a day (TID) | ORAL | Status: DC
  Administered 2017-06-28 – 2017-07-02 (×13): 500 mg via ORAL
  Filled 2017-06-28 (×13): qty 1

## 2017-06-28 MED ORDER — CIPROFLOXACIN HCL 500 MG PO TABS
500.0000 mg | ORAL_TABLET | Freq: Two times a day (BID) | ORAL | Status: DC
  Administered 2017-06-28 – 2017-07-02 (×8): 500 mg via ORAL
  Filled 2017-06-28 (×8): qty 1

## 2017-06-28 MED ORDER — METRONIDAZOLE 500 MG PO TABS: 500.0000 mg | ORAL_TABLET | Freq: Four times a day (QID) | ORAL | Status: DC

## 2017-06-28 MED ORDER — ENOXAPARIN SODIUM 60 MG/0.6ML ~~LOC~~ SOLN
60.0000 mg | SUBCUTANEOUS | Status: DC
  Administered 2017-06-29 – 2017-07-02 (×4): 60 mg via SUBCUTANEOUS
  Filled 2017-06-28 (×4): qty 0.6

## 2017-06-28 NOTE — Progress Notes (Signed)
SATURATION QUALIFICATIONS: (This note is used to comply with regulatory documentation for home oxygen)  Patient Saturations on Room Air at Rest = 84%  Patient Saturations on Room Air while Ambulating = did not attempt due to 84% at rest on RA   Patient Saturations on 2 Liters of oxygen while Ambulating = 89%  Please briefly explain why patient needs home oxygen:  Pt requires 02 to prevent desaturation of 02 and allow her to complete ADLs and  Mobility.  Lucille Passy, OTR/L [phone]

## 2017-06-28 NOTE — Progress Notes (Addendum)
Family Medicine Teaching Service Daily Progress Note Intern Pager: [phone]  Patient name: [name] record number: [id number] Date of birth: 12/12/1945 Age: 72 y.o. Gender: female  Primary Care Provider: Dixie Dials, MD Consultants: Hemonc, Wound care Code Status: Full  Pt Overview and Major Events to Date:  5/23 Admitted for fever and pancytopenia found to have diverticulitis on CT abd/pelvis 5/24 Found to have methotrexate toxicity, transfused 1u RBC 5/27 Initiated Neupogen per Dr. Marin Olp recs ([place]) 5/28 Started EPO 5/29 First measurable reticulocytes on labs  Assessment and Plan: [name] is a 72 y.o. female presenting with diverticulitis and pancytopenia secondary to methotrexate toxicity.PMH is significant for rheumatoid arthritis, h/o chronic diverticulitis with colostomy, hypothyroidism, HTN, HLD, CAD, peripheral edema.  Pancytopenia 2/2 Methotrexate toxicity with mucositis, improving.  WBC 1.0>2.7> 8.6>18.8, Hgb 8.1, Plt 244. ANC 0.6>5.0>13.2. Current transfusion threshold is 7.0 per Dr. Marin Olp - Heme/onc (Dr. Marin Olp) following, stop Neupogen since at goal [place] >1500. S/p Nplate x1 on 0/92, s/p granix x1 on 5/24. - on ciprofloxacin/metronidazole (5/24-) for AoC diverticulitis - s/p acyclovir/diflucan (3/30-0/76) - Daily folic acid supplementation - Daily CBC with differential - On Aranesp 153mg weekly - lidocaine/maalox/[place] mouth washes prn - isolation precautions pending improvement in ANC  Fever, resolved:  Last documented fever 102.33F on 5/31, now afebrile and asymptomatic. At increased risk for infection due to immunosuppression so will have low threshold to broaden ABX. - BCx and UCx NG final.  - monitor fever curve - on ciprofloxacin/metronidazole per above   Acute on chronic diverticulitis, improving. Stable. Abdominal pain resolved and tolerating po.  - On IV Cipro and Flagyl (5/23-), transition to po today to complete 14 day  course  Hypomagnesemia:  1.5 on 5/31 (s/p 4G 5/30). Additional 2g IV given on 5/31.  - recheck Mg   Stomatitis with ulceration, resolved:  Stable, now no longer painful.  - Wound care consulted, ostomy pouch with good seal, can change dressing prn.  Anemia of chronic inflammation:  On iron tablets at home.  Iron studies support adequate iron stores and high saturation ratio.  Ferritin elevated 431. - Holding home ferrous sulfate 325 mg daily, will restart on d/c   Rheumatoid arthritis: Chronic. Stable.  Recently switched rheumatologist and patient is on methotrexate.  Patient presenting with toxicity due to taking daily methotrexate. - Holding home methotrexate 2.5 mg every Friday in light of toxicity, see plan #1 above - Continue folate supplementation    Hypothyroidism: Chronic.  On Synthroid at home. - Continue home Synthroid 50 mcg daily    Hyperlipidemia: Chronic.  On simvastatin and daily aspirin at home. - Continue home simvastatin 40 mg daily - Restart home aspirin 325 mg daily as outpatient  Chronic pain:  Medications include Norco 10-325 every 4 hours as needed and NSAIDs. - Tylenol 650 mg q6h prn, ibuprofen 600 mg q6h prn for mild-mod pain - Norco [id number] for severe pain - Holding home naproxen  FEN/GI: heart healthy diet Prophylaxis: lovenox  Disposition: Likely DC tomorrow if doing well on po ABX. HHOT/HHPT with 24h sup.  Subjective:  Patient states that feeling well this morning. No abdominal pain currently. Denies fever/chills, SOB.   Objective: Temp:  [97.8 F (36.6 C)-98.2 F (36.8 C)] 97.9 F (36.6 C) (06/02 0730) Pulse Rate:  [66-92] 90 (06/02 0730) Resp:  [16-23] 23 (06/02 0730) BP: (102-114)/(45-63) 105/49 (06/02 0730) SpO2:  [86 %-100 %] 92 % (06/02 0730) Weight:  [267 lb 10.2 oz (121.4 kg)] 267 lb  10.2 oz (121.4 kg) (06/02 0432) Physical Exam: General:  pleasant female in NAD laying in bed comfortably Cardiovascular: RRR, 1/6 murmur.  No LE edema Respiratory: CTAB, no wheezes/crackles Gastrointestinal: obese, soft, no TTP, stoma with good seal and no leaking or erythema or skin breakdown Neuro: grossly normal Psych: AOx3, appropriate  Laboratory: Recent Labs  Lab 06/25/17 0644 06/26/17 0446 06/27/17 0621  WBC 1.6* 2.7* 8.4  HGB 7.4* 7.6* 8.2*  HCT 23.5* 24.6* 26.6*  PLT 43* 92* 160   Recent Labs  Lab 06/25/17 0644 06/26/17 0446 06/27/17 0621  NA 141 139 139  K 3.7 3.4* 3.3*  CL 111 108 104  CO2 _0 BUN 13 <5* <5*  CREATININE 0.97 0.99 1.21*  CALCIUM 8.4* 8.3* 8.3*  PROT 5.6* 5.5* 5.6*  BILITOT 0.6 0.5 0.6  ALKPHOS 38 39 43  ALT 14 14 12*  AST _1 GLUCOSE 104* 103* 114*    6/2 ANC 13.2K 5/24 Folate 1125 5/24 Vitamin B12 191 (low end of normal) 5/24 Iron and TIBC/ferritin (anemia of chronic inflammation) 5/24 HIV antibody nonreactive 5/23 Urinalysis with microscopy cloudy, small bilirubin, moderate leuks  Imaging/Diagnostic Tests: CT ABDOMEN AND PELVIS WITH CONTRAST (06/18/2017) 1. Acute diverticulitis of a portion of the proximal colon contained within the ventral abdominal hernia. No abscess or free intraperitoneal air. 2. Large ventral abdominal hernia containing small bowel and colon without obstruction. 3. 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 No obstruction. 4.  Aortic Atherosclerosis.  Dg Chest 2 View  Result Date: 06/27/2017 CLINICAL DATA:  Fever and shortness of breath. EXAM: CHEST - 2 VIEW COMPARISON:  Single-view of the chest 06/18/2017. PA and lateral chest 10/22/2015. FINDINGS: Elevation of the right hemidiaphragm relative to the left is again seen. Bibasilar airspace disease is worse on the left. There is cardiomegaly and interstitial edema. No pneumothorax. No pleural effusion. Aortic atherosclerosis is noted. No acute bony abnormality. IMPRESSION: Pulmonary edema. Left worse than right basilar airspace disease could be due to atelectasis,  pneumonia or more intense pulmonary edema. Cardiomegaly. Atherosclerosis. Electronically Signed   By: Inge Rise M.D.   On: 06/27/2017 11:43    Bufford Lope, DO 06/28/2017, 9:04 AM PGY-2, [place] Intern pager: [phone], text pages welcome

## 2017-06-28 NOTE — Progress Notes (Signed)
Nurse tech reported that patient's O2 sats were 87% on room air. RN went to check on patient and she was resting in bed and not complaining of shortness of breath. Lung fields are are clear ; but diminished. RN put patient on 2L and called MD. MD stated to take patient off oxygen and walk her on room air. OT went to assess patient and found her @ 84 % on room air. Please see OT note. Patient is back on 2L and has been showed how to use incentive spirometer and demonstrated accurately how to use it.

## 2017-06-28 NOTE — Progress Notes (Signed)
Occupational Therapy Treatment Patient Details Name: [name] MRN: [id number] DOB: 01/17/1946 Today's Date: 06/28/2017    History of present illness [name] is a 72 y.o. female presenting with diverticulitis and pancytopenia secondary to methotrexate toxicity.PMH is significant for rheumatoid arthritis, h/o chronic diverticulitis with colostomy, hypothyroidism, HTN, HLD, CAD, peripheral edema.   OT comments  Pt with very limited activity tolerance today.  Upon entrance, pt had returned from [place] and was supine with DOE 3/4.  02 sats checked and were 84% on RA.  They increased to 87% on 1L, and 95% on 2L.   With activity sats 89-91% on 2L with DOE 3/4 - 4/4 and multiple rest breaks due to DOE.   Pt returned to supine with  sats 97% at rest on 2L.  RN notified.    Follow Up Recommendations  Home health OT    Equipment Recommendations  None recommended by OT    Recommendations for Other Services      Precautions / Restrictions Precautions Precautions: Fall;Other (comment) Precaution Comments: neutropenic        Mobility Bed Mobility Overal bed mobility: Needs Assistance         Sit to supine: Min guard   General bed mobility comments: increased time and effort   Transfers Overall transfer level: Needs assistance Equipment used: Rolling walker (2 wheeled) Transfers: Sit to/from [facility] Sit to Stand: Min guard Stand pivot transfers: Min guard       General transfer comment: moves slowly     Balance Overall balance assessment: Needs assistance Sitting-balance support: Feet supported;No upper extremity supported Sitting balance-Leahy Scale: Fair     Standing balance support: No upper extremity supported Standing balance-Leahy Scale: Fair                             ADL either performed or assessed with clinical judgement   ADL Overall ADL's : Needs assistance/impaired                     Lower Body Dressing: Minimal  assistance;Sit to/from stand   Toilet Transfer: Min guard;Ambulation;Comfort height toilet;RW   Toileting- Water quality scientist and Hygiene: Min guard;Sit to/from stand       Functional mobility during ADLs: Min guard;Rolling walker General ADL Comments: pt with DOE 3/4 - 4/4 with minimal activity      Vision       Perception     Praxis      Cognition Arousal/Alertness: Awake/alert Behavior During Therapy: WFL for tasks assessed/performed Overall Cognitive Status: Within Functional Limits for tasks assessed                                          Exercises     Shoulder Instructions       General Comments Pt had just returned to supine from [place] and DOE 3/4.  02 sats 84%.   1L 02 applied and after 3 mins pt 87%.  with 2L 02 sats improved to 95%.   with activity 02 sats 89-91% on 2L supplemental 02, and 97% at rest on 2L  HR 82-89    Pertinent Vitals/ Pain       Pain Assessment: Faces Faces Pain Scale: No hurt  Home Living  Prior Functioning/Environment              Frequency  Min 2X/week        Progress Toward Goals  OT Goals(current goals can now be found in the care plan section)  Progress towards OT goals: Not progressing toward goals - comment(DOE)     Plan Discharge plan remains appropriate    Co-evaluation                 AM-PAC PT "6 Clicks" Daily Activity     Outcome Measure   Help from another person eating meals?: None Help from another person taking care of personal grooming?: A Little Help from another person toileting, which includes using toliet, bedpan, or urinal?: A Little Help from another person bathing (including washing, rinsing, drying)?: A Little Help from another person to put on and taking off regular upper body clothing?: A Little Help from another person to put on and taking off regular lower body clothing?: A Little 6 Click Score:  19    End of Session Equipment Utilized During Treatment: Rolling walker  OT Visit Diagnosis: Unsteadiness on feet (R26.81);Pain   Activity Tolerance Patient limited by fatigue   Patient Left in bed;with call bell/phone within reach   Nurse Communication Mobility status        Time: 1517-6160 OT Time Calculation (min): 25 min  Charges: OT General Charges $OT Visit: 1 Visit OT Treatments $Therapeutic Activity: 23-37 mins     Peg Fifer M 06/28/2017, 3:49 PM

## 2017-06-29 ENCOUNTER — Inpatient Hospital Stay (HOSPITAL_COMMUNITY): Payer: Medicare Other

## 2017-06-29 DIAGNOSIS — D702 Other drug-induced agranulocytosis: Secondary | ICD-10-CM | POA: Diagnosis not present

## 2017-06-29 DIAGNOSIS — T451X1D Poisoning by antineoplastic and immunosuppressive drugs, accidental (unintentional), subsequent encounter: Secondary | ICD-10-CM | POA: Diagnosis not present

## 2017-06-29 DIAGNOSIS — K5792 Diverticulitis of intestine, part unspecified, without perforation or abscess without bleeding: Secondary | ICD-10-CM | POA: Diagnosis not present

## 2017-06-29 LAB — BASIC METABOLIC PANEL
ANION GAP: 7 (ref 5–15)
BUN: 10 mg/dL (ref 6–20)
CHLORIDE: 106 mmol/L (ref 101–111)
CO2: 29 mmol/L (ref 22–32)
Calcium: 8.7 mg/dL — ABNORMAL LOW (ref 8.9–10.3)
Creatinine, Ser: 1.23 mg/dL — ABNORMAL HIGH (ref 0.44–1.00)
GFR calc Af Amer: 50 mL/min — ABNORMAL LOW (ref >60)
GFR, EST NON AFRICAN AMERICAN: 43 mL/min — AB (ref >60)
Glucose, Bld: 92 mg/dL (ref 65–99)
POTASSIUM: 4.3 mmol/L (ref 3.5–5.1)
SODIUM: 142 mmol/L (ref 135–145)

## 2017-06-29 LAB — CBC WITH DIFFERENTIAL/PLATELET
BASOS ABS: 0 10*3/uL (ref 0.0–0.1)
BASOS PCT: 0 %
EOS ABS: 0 10*3/uL (ref 0.0–0.7)
Eosinophils Relative: 0 %
HCT: 24.3 % — ABNORMAL LOW (ref 36.0–46.0)
Hemoglobin: 7.6 g/dL — ABNORMAL LOW (ref 12.0–15.0)
LYMPHS PCT: 12 %
Lymphs Abs: 2.9 10*3/uL (ref 0.7–4.0)
MCH: 27.4 pg (ref 26.0–34.0)
MCHC: 31.3 g/dL (ref 30.0–36.0)
MCV: 87.7 fL (ref 78.0–100.0)
MONOS PCT: 16 %
Monocytes Absolute: 3.9 10*3/uL — ABNORMAL HIGH (ref 0.1–1.0)
NEUTROS ABS: 17.7 10*3/uL — AB (ref 1.7–7.7)
Neutrophils Relative %: 72 %
PLATELETS: 282 10*3/uL (ref 150–400)
RBC: 2.77 MIL/uL — ABNORMAL LOW (ref 3.87–5.11)
RDW: 16.1 % — ABNORMAL HIGH (ref 11.5–15.5)
WBC: 24.5 10*3/uL — ABNORMAL HIGH (ref 4.0–10.5)

## 2017-06-29 LAB — PATHOLOGIST SMEAR REVIEW

## 2017-06-29 LAB — MAGNESIUM: MAGNESIUM: 1.9 mg/dL (ref 1.7–2.4)

## 2017-06-29 IMAGING — CR DG CHEST 2V
2 series · 2 of 2 positions shown · non-contrast
Comparison: [DATE] and decubitus uses [DATE].

CLINICAL DATA: Oxygen dependence.

EXAM:
CHEST - 2 VIEW

[chest lat]
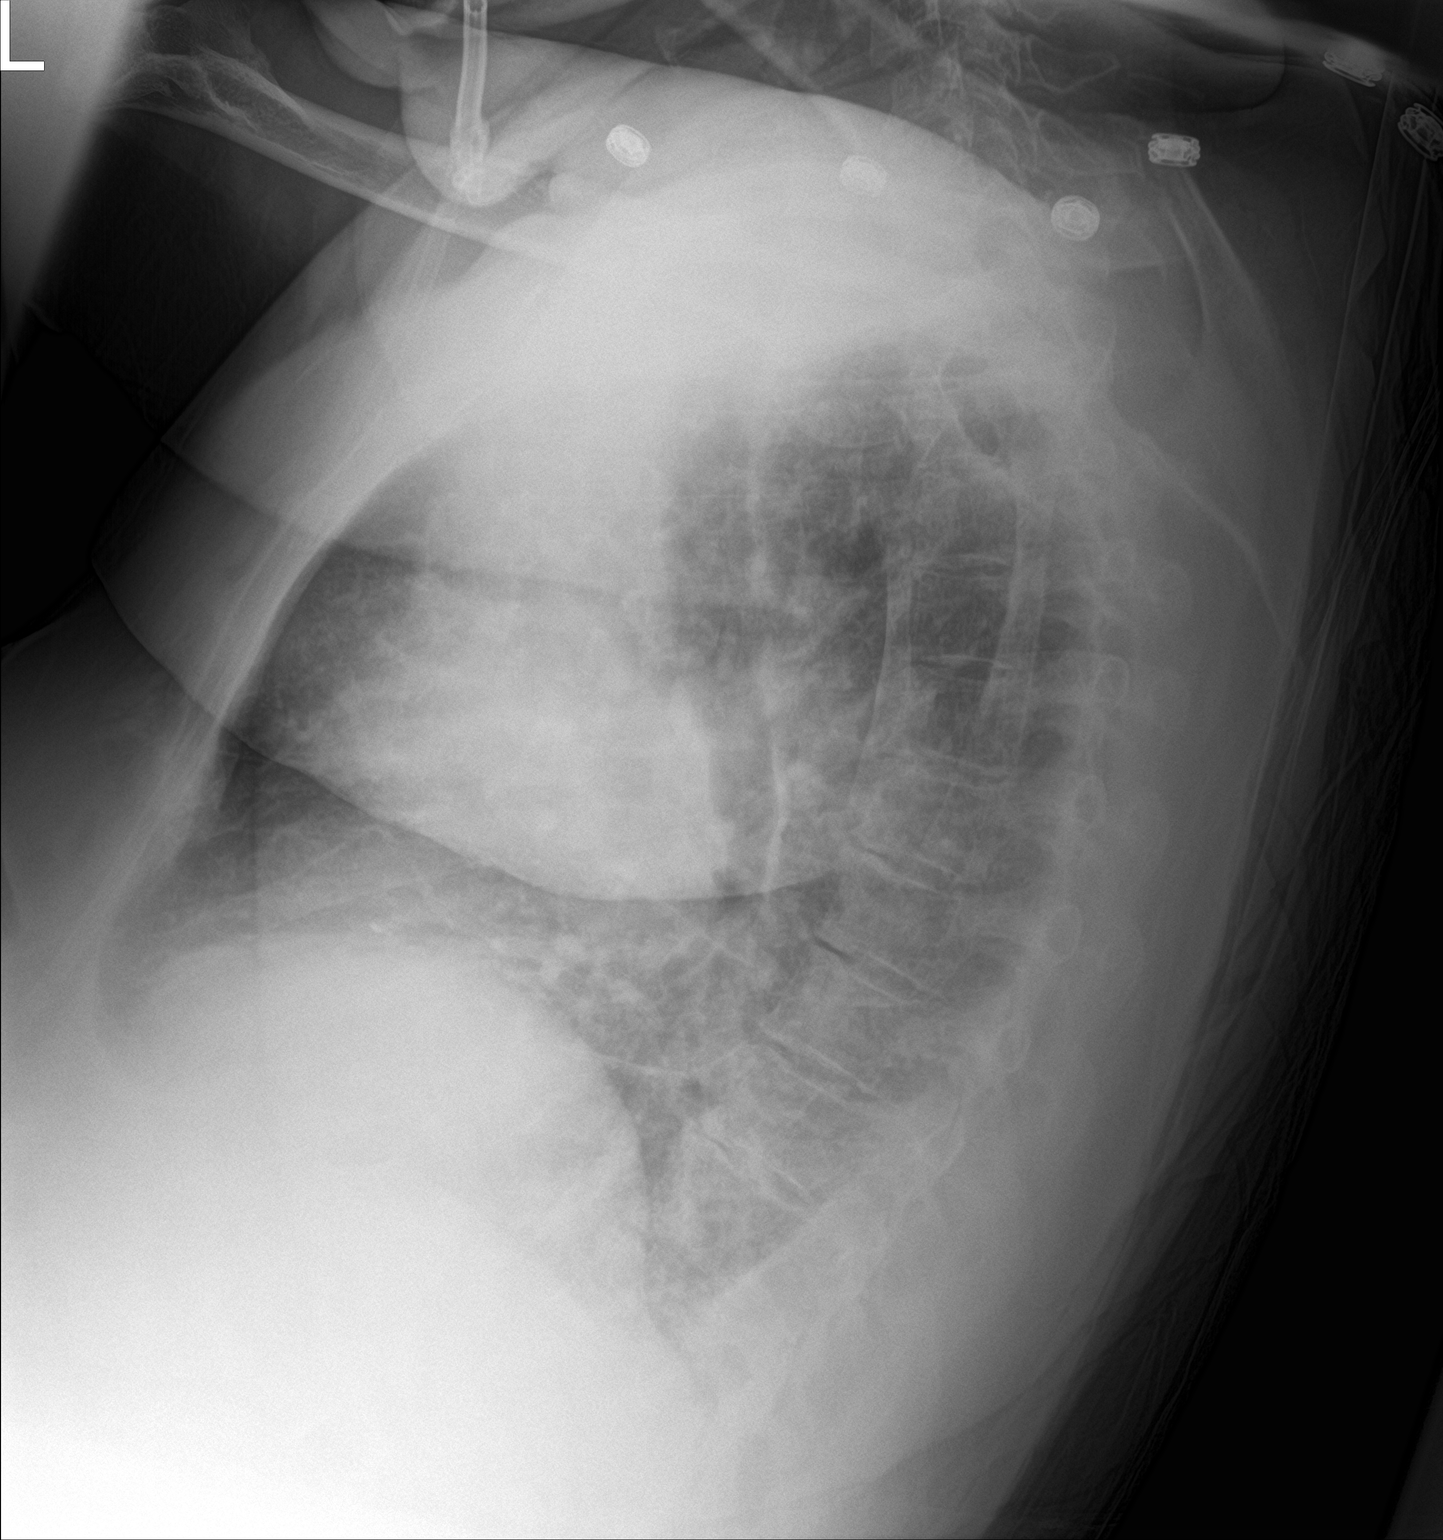

[chest ap]
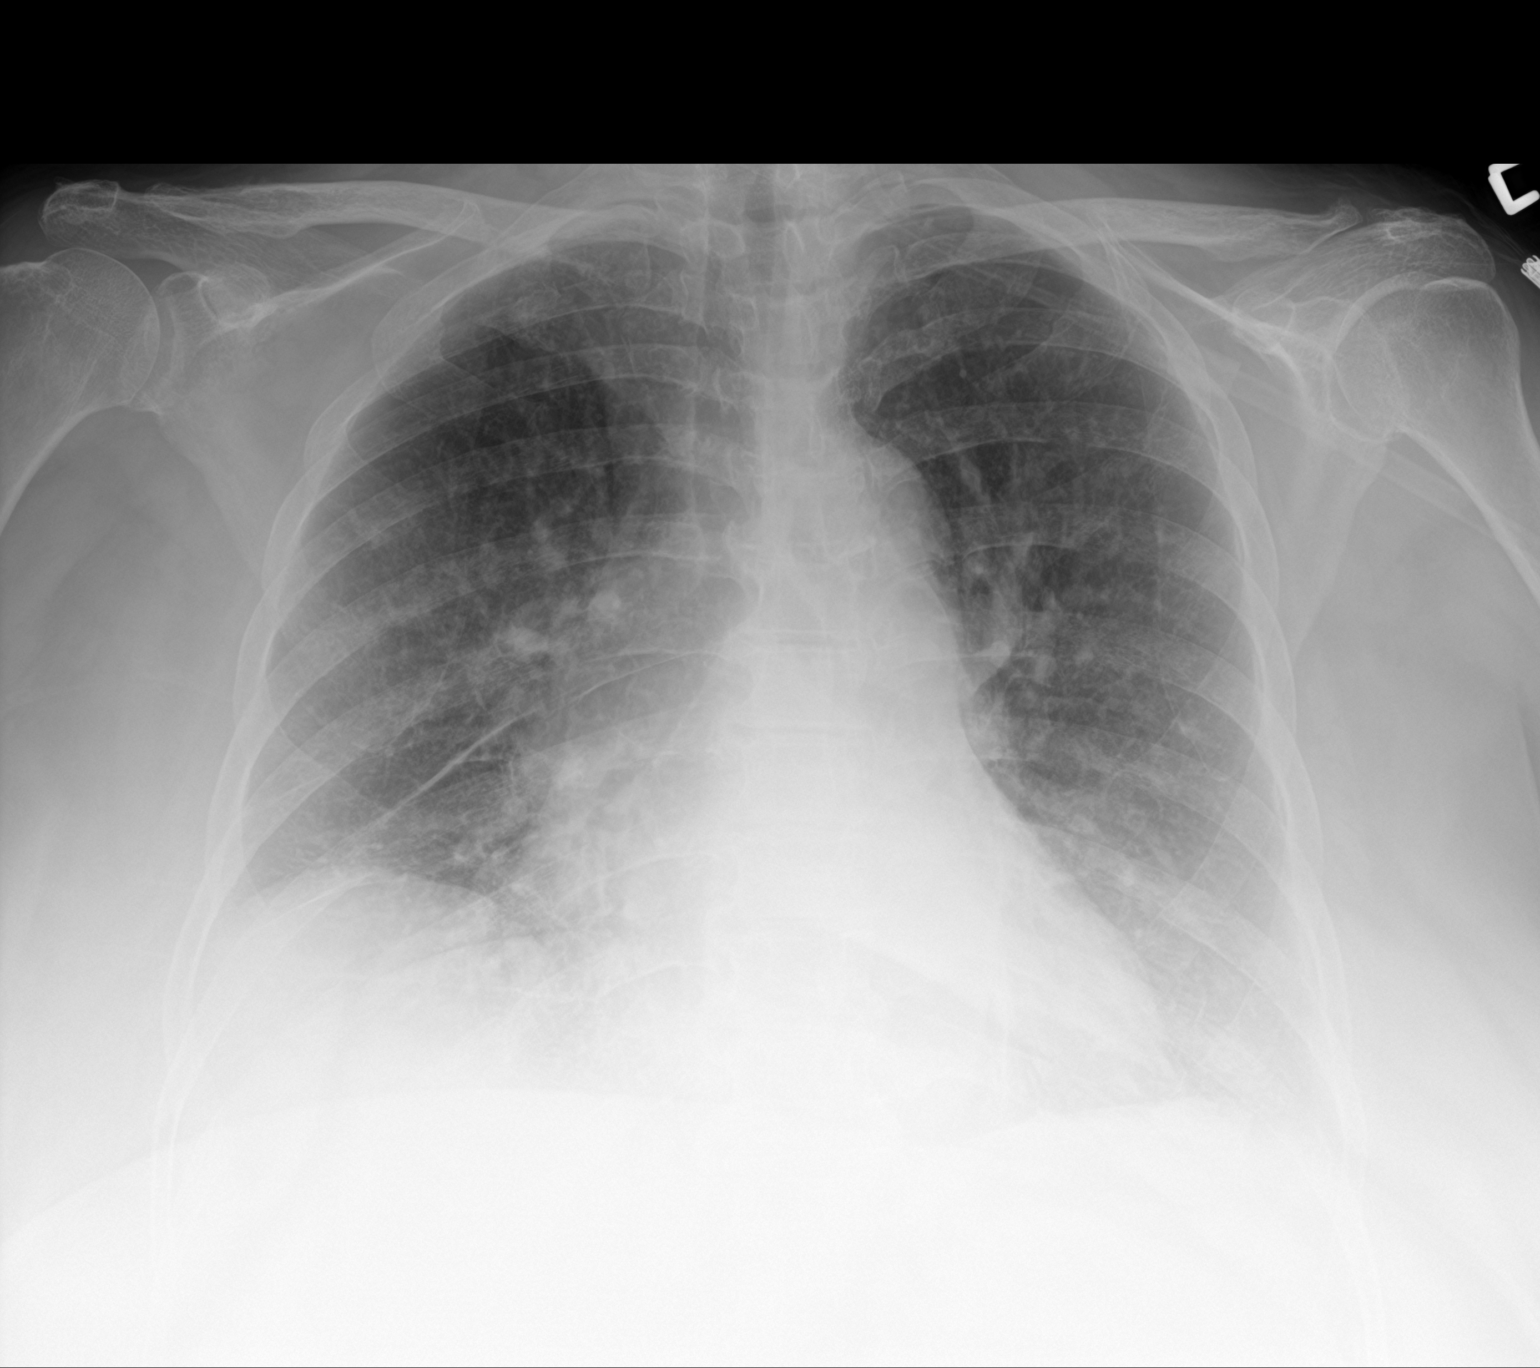

[2 of 2 positions shown; findings below may reference images not displayed]

FINDINGS: Degraded exam secondary to patient arm position and body habitus.
Right hemidiaphragm elevation. Suspect mild right-sided pleural
thickening posteriorly. Midline trachea. Moderate cardiomegaly.
Atherosclerosis in the transverse aorta. No pneumothorax. No
congestive failure. No lobar consolidation.
IMPRESSION: No acute cardiopulmonary disease.

Cardiomegaly without congestive failure.

## 2017-06-29 IMAGING — CR DG CHEST DECUBITUS BILAT
2 series · 2 of 2 positions shown · non-contrast
Comparison: PA and lateral chest [DATE].

CLINICAL DATA: Shortness of breath.  Question pleural effusion.

EXAM:
CHEST - BILATERAL DECUBITUS VIEW

[chest decu (1 of 2)]
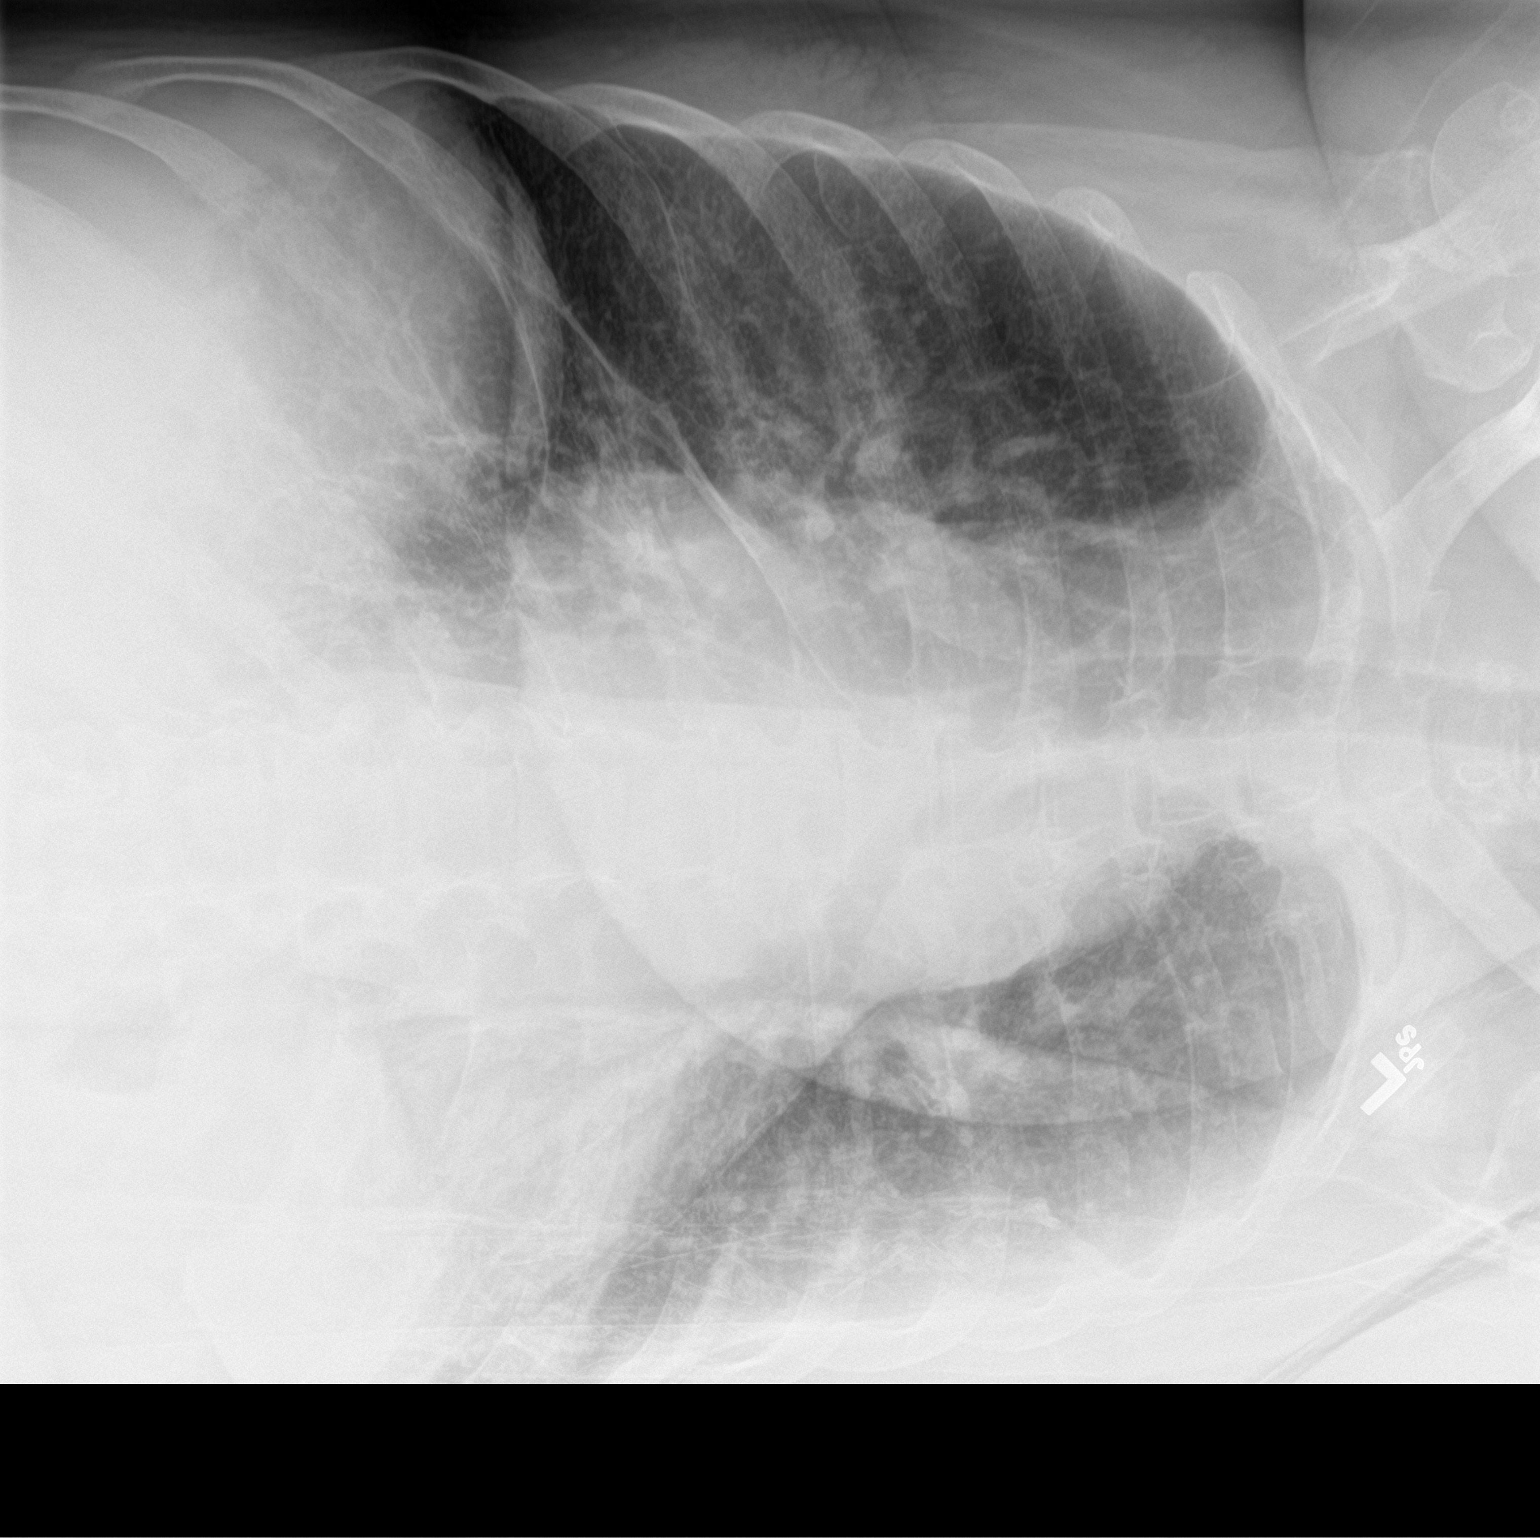

[chest decu (2 of 2)]
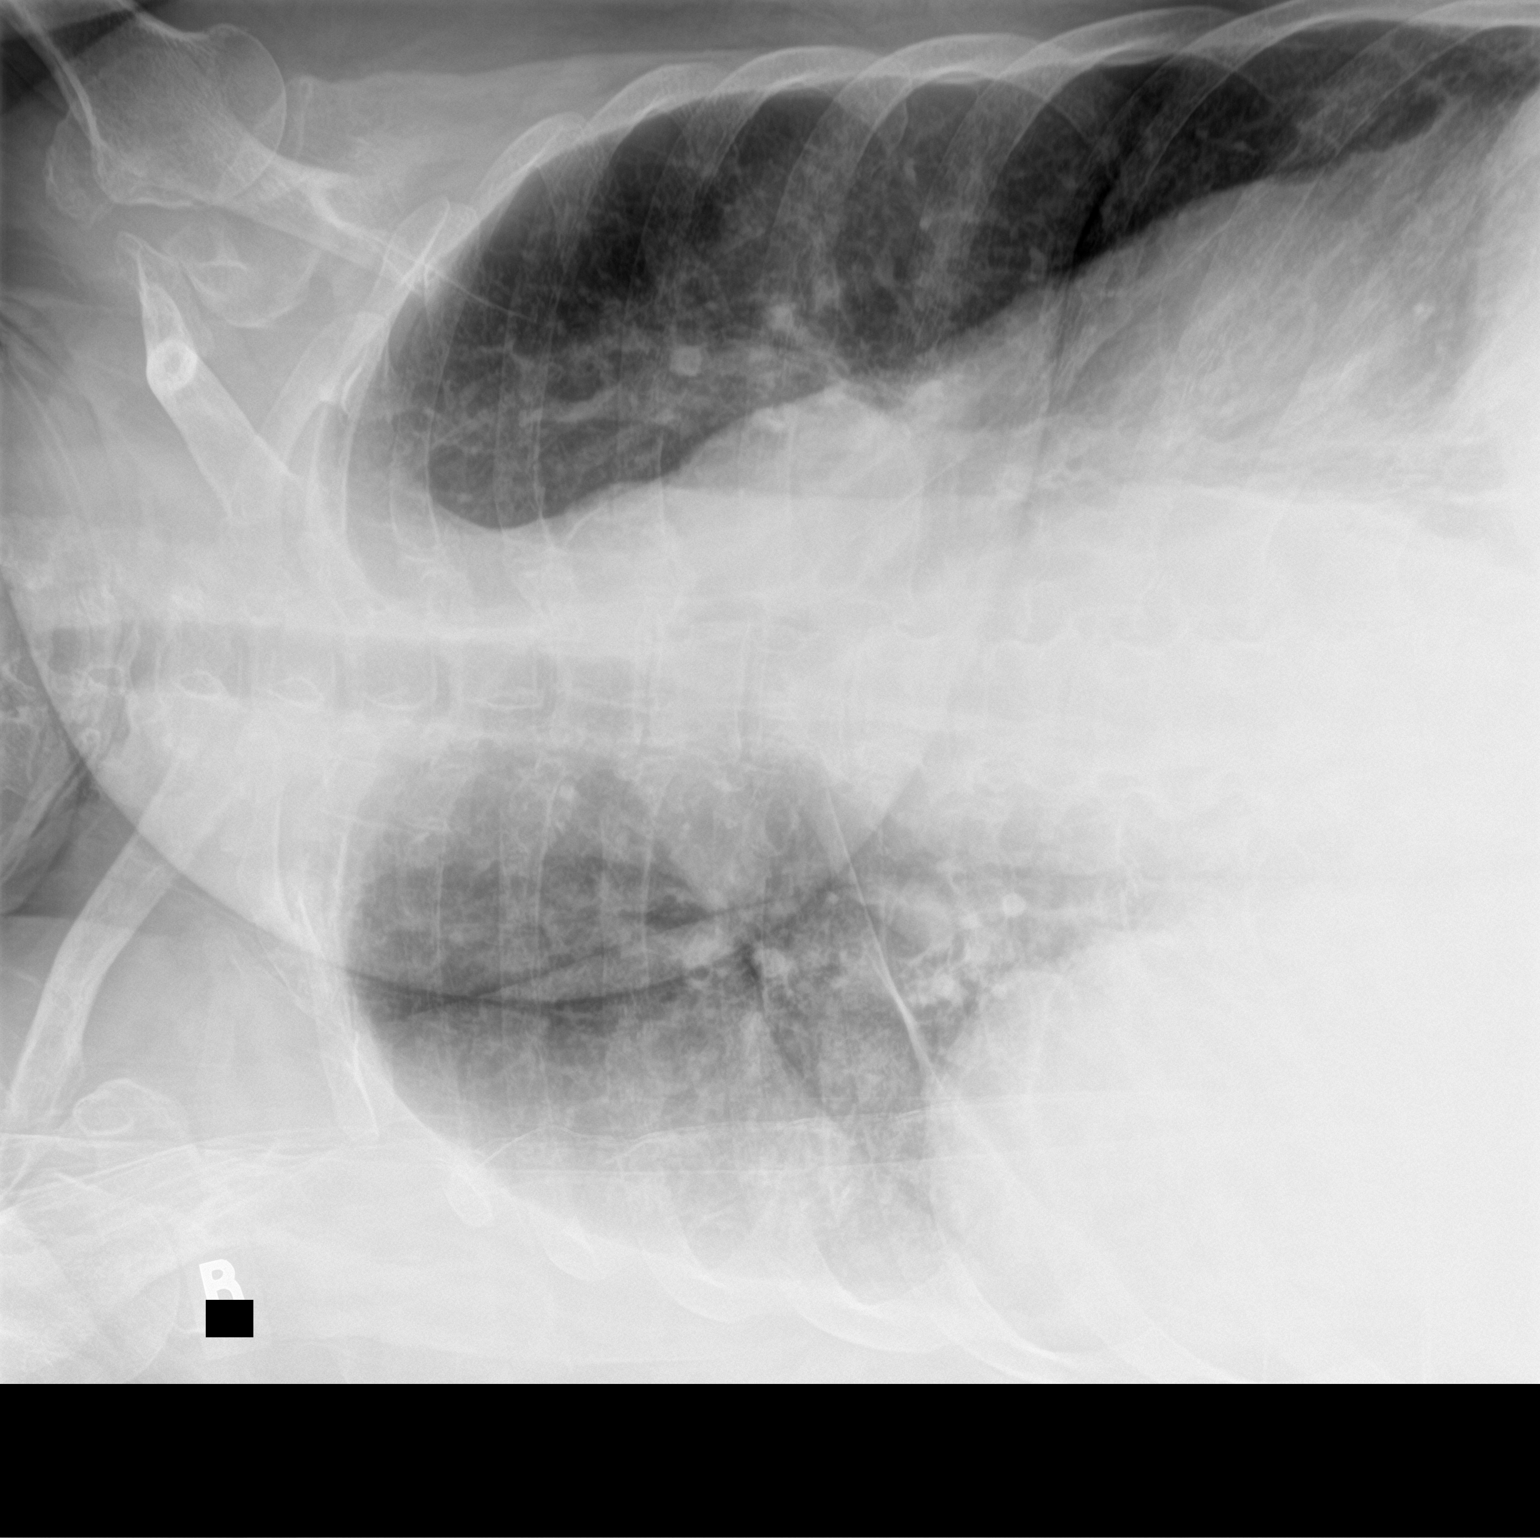

[2 of 2 positions shown; findings below may reference images not displayed]

FINDINGS: No pleural effusion is identified.
IMPRESSION: As above.

## 2017-06-29 MED ORDER — FUROSEMIDE 10 MG/ML IJ SOLN
40.0000 mg | Freq: Once | INTRAMUSCULAR | Status: AC
  Administered 2017-06-29: 40 mg via INTRAVENOUS
  Filled 2017-06-29: qty 4

## 2017-06-29 NOTE — Progress Notes (Signed)
Occupational Therapy Treatment Patient Details Name: [name] MRN: [id number] DOB: 09-Sep-1945 Today's Date: 06/29/2017    History of present illness [name] is a 72 y.o. female presenting with diverticulitis and pancytopenia secondary to methotrexate toxicity.PMH is significant for rheumatoid arthritis, h/o chronic diverticulitis with colostomy, hypothyroidism, HTN, HLD, CAD, peripheral edema.   OT comments  Session focused on B UE strengthening, endurance, and pursed lip breathing. Pt performed various exercises with use of level 1 theraband as well as aerobic exercise against gravity. Pt required multiple rest breaks after 10 reps of exercise with min verbal cues for pursed lip breathing. Pt on 2 L 02 via [place] throughout session and remaining at 95-97% of O2 saturation with therapeutic exercise. Pt fatigues very quickly and requests to remain in bed at end of session. Bed alarm activated and call bell within reach.   Follow Up Recommendations  Home health OT    Equipment Recommendations  None recommended by OT    Recommendations for Other Services      Precautions / Restrictions Precautions Precautions: Fall;Other (comment) Precaution Comments: neutropenic  Restrictions Weight Bearing Restrictions: No              ADL either performed or assessed with clinical judgement     Cognition Arousal/Alertness: Awake/alert Behavior During Therapy: WFL for tasks assessed/performed Overall Cognitive Status: Within Functional Limits for tasks assessed                            Pertinent Vitals/ Pain       Pain Assessment: No/denies pain Pain Score: 0-No pain         Frequency  Min 2X/week        Progress Toward Goals  OT Goals(current goals can now be found in the care plan section)  Progress towards OT goals: Progressing toward goals  Acute Rehab OT Goals Patient Stated Goal: Pt wants to go home  OT Goal Formulation: With patient Time For Goal  Achievement: 07/13/17 Potential to Achieve Goals: Good  Plan Discharge plan remains appropriate       AM-PAC PT "6 Clicks" Daily Activity     Outcome Measure   Help from another person eating meals?: None Help from another person taking care of personal grooming?: A Little Help from another person toileting, which includes using toliet, bedpan, or urinal?: A Little Help from another person bathing (including washing, rinsing, drying)?: A Little Help from another person to put on and taking off regular upper body clothing?: A Little Help from another person to put on and taking off regular lower body clothing?: A Little 6 Click Score: 19    End of Session Equipment Utilized During Treatment: Oxygen  OT Visit Diagnosis: Unsteadiness on feet (R26.81);Pain   Activity Tolerance Patient limited by fatigue   Patient Left in bed;with call bell/phone within reach   Nurse Communication          Time: 2575-0518 OT Time Calculation (min): 15 min  Charges: OT General Charges $OT Visit: 1 Visit OT Treatments $Therapeutic Exercise: 8-22 mins    Kosei Rhodes P, MS, OTR/L 06/29/2017, 10:10 AM

## 2017-06-29 NOTE — Consult Note (Signed)
[facility] Nurse ostomy follow up Stoma type/location: 1 3/8" flush some LLQ, more medial with difficult abdominal topography   Output liquid, with high output. Today when I arrived pouch was about to explode  Patient has been getting much better wear time and she reports her skin has improved  Ostomy pouching: 1pc with skin barrier ring used to flatten dips in the abdominal skin    Education provided:  Instructed to use pattern to cut skin barrier moving forward Instructions on how to use stoma powder to "crust" denuded skin She will have HHRN to assist at home and have supplies shipped. Extra pouches, ostomy powder, barrier  Rings in the room for patient's use, she reports she will be DC today.  [facility] nurse will follow daily for ostomy care   [place], [place], [place]

## 2017-06-29 NOTE — Progress Notes (Signed)
Physical Therapy Treatment Patient Details Name: [name] MRN: [id number] DOB: 05/31/1945 Today's Date: 06/29/2017    History of Present Illness [name] is a 72 y.o. female presenting with diverticulitis and pancytopenia secondary to methotrexate toxicity.PMH is significant for rheumatoid arthritis, h/o chronic diverticulitis with colostomy, hypothyroidism, HTN, HLD, CAD, peripheral edema.    PT Comments    Patient able to ambulate ~12f with RW and min guard assist. Pt required several standing rest breaks due to 3/4 DOE and needed mod cues for breathing technique as pt tends to breath through her mouth despite supplemental O2 via [place]. Pt with SpO2 to 80% on RA with mobility and up to 92% on 2L O2 via [place] with mobility. Continue to progress as tolerated.    Follow Up Recommendations  Supervision/Assistance - 24 hour;Home health PT     Equipment Recommendations  None recommended by PT    Recommendations for Other Services OT consult     Precautions / Restrictions Precautions Precautions: Fall;Other (comment) Precaution Comments: neutropenic  Restrictions Weight Bearing Restrictions: No    Mobility  Bed Mobility Overal bed mobility: Needs Assistance Bed Mobility: Sit to Supine;Supine to Sit     Supine to sit: Min guard Sit to supine: Min guard   General bed mobility comments: increased time and effort   Transfers Overall transfer level: Needs assistance Equipment used: Rolling walker (2 wheeled) Transfers: Sit to/from Stand Sit to Stand: Min guard Stand pivot transfers: Min guard       General transfer comment: min guard for safety; 2 attempts to stand before standing upright; no physical assistance necessary  Ambulation/Gait Ambulation/Gait assistance: Min guard Ambulation Distance (Feet): 80 Feet Assistive device: Rolling walker (2 wheeled) Gait Pattern/deviations: Step-through pattern;Decreased stride length;Wide base of support(lateral sway) Gait  velocity: Decreased   General Gait Details: several standing rest breaks and cues for PLB; pt with 3/4 DOE and required 2L O2 via [place] to maintain SpO2 >90%   Stairs             Wheelchair Mobility    Modified Rankin (Stroke Patients Only)       Balance Overall balance assessment: Needs assistance Sitting-balance support: Feet supported;No upper extremity supported Sitting balance-Leahy Scale: Fair     Standing balance support: During functional activity;Single extremity supported Standing balance-Leahy Scale: Fair                              Cognition Arousal/Alertness: Awake/alert Behavior During Therapy: WFL for tasks assessed/performed Overall Cognitive Status: Within Functional Limits for tasks assessed                                        Exercises      General Comments General comments (skin integrity, edema, etc.): SpO2 desat to 80% on RA with mobility      Pertinent Vitals/Pain Pain Assessment: No/denies pain    Home Living                      Prior Function            PT Goals (current goals can now be found in the care plan section) Acute Rehab PT Goals Patient Stated Goal: Pt wants to go home  PT Goal Formulation: With patient Time For Goal Achievement: 07/08/17 Potential to Achieve Goals: Fair  Progress towards PT goals: Progressing toward goals    Frequency    Min 3X/week      PT Plan Current plan remains appropriate    Co-evaluation              AM-PAC PT "6 Clicks" Daily Activity  Outcome Measure  Difficulty turning over in bed (including adjusting bedclothes, sheets and blankets)?: None Difficulty moving from lying on back to sitting on the side of the bed? : A Lot Difficulty sitting down on and standing up from a chair with arms (e.g., wheelchair, bedside commode, etc,.)?: A Lot Help needed moving to and from a bed to chair (including a wheelchair)?: A Little Help needed  walking in hospital room?: A Little Help needed climbing 3-5 steps with a railing? : A Lot 6 Click Score: 16    End of Session Equipment Utilized During Treatment: Gait belt Activity Tolerance: Patient tolerated treatment well Patient left: with call bell/phone within reach;in bed;with bed alarm set Nurse Communication: Mobility status PT Visit Diagnosis: Unsteadiness on feet (R26.81);Muscle weakness (generalized) (M62.81);Other abnormalities of gait and mobility (R26.89)     Time: 7846-9629 PT Time Calculation (min) (ACUTE ONLY): 26 min  Charges:  $Gait Training: 8-22 mins $Therapeutic Activity: 8-22 mins                    G Codes:       Earney Navy, PTA Pager: [phone]     Darliss Cheney 06/29/2017, 5:21 PM

## 2017-06-29 NOTE — Progress Notes (Signed)
Nurse hook O2 off of patient while she went to the restroom. Her O2 is  Between 93 and 94%. Will continue to monitor.

## 2017-06-29 NOTE — Care Management Note (Signed)
Case Management Note Prev Note Created by Bronson Curb  Patient Details  Name: [name] MRN: [id number] Date of Birth: 1945-06-09  Subjective/Objective:                    Action/Plan: Pt with orders for [facility] services when medically ready for d/c. CM met with the patient and provided her choice. She has used [facility] in past and asked to use them again. [name] with [facility] notified and accepted the referral. No DME needs per therapy. Pt states her daughter is currently staying with her and can provide assistance at home. CM following.  Expected Discharge Date:                  Expected Discharge Plan:  Home/Self Care  In-House Referral:     Discharge planning Services  CM Consult  Post Acute Care Choice:  Home Health Choice offered to:  Patient  DME Arranged:    DME Agency:     HH Arranged:  PT, OT HH Agency:  [place]  Status of Service:  In process, will continue to follow  If discussed at Long Length of Stay Meetings, dates discussed:    Additional Comments: 06/29/2017 Pt informed CM that her daughter will provide 24/7 supervision at discharge, pt also informed CM that colostomy supplies are delivered to the home.  Pt had colostomy PTA and independently manages at home.   Maryclare Labrador, RN 06/29/2017, 10:50 AM

## 2017-06-29 NOTE — Progress Notes (Addendum)
Family Medicine Teaching Service Daily Progress Note Intern Pager: [phone]  Patient name: National record number: [id number] Date of birth: 25-Jun-1945 Age: 72 y.o. Gender: female  Primary Care Provider: Dixie Dials, MD Consultants: Hemonc, Wound care Code Status: Full  Pt Overview and Major Events to Date:  5/23 Admitted for fever and pancytopenia found to have diverticulitis on CT abd/pelvis 5/24 Found to have methotrexate toxicity, transfused 1u RBC 5/27 Initiated Neupogen per Dr. Marin Olp recs ([place]) 5/28 Started EPO 5/29 First measurable reticulocytes on labs  Assessment and Plan: [name] is a 72 y.o. female presenting with diverticulitis and pancytopenia secondary to methotrexate toxicity.PMH is significant for rheumatoid arthritis, h/o chronic diverticulitis with colostomy, hypothyroidism, HTN, HLD, CAD, peripheral edema.  New O2 requirement. Patient has 30 year smoking history, gets short of breath when ambulating, and has an albuterol rescue inhaler at home, but she has never required oxygen before this hospitalization.  She desaturated to 84% at rest on 6/2 and required 2L O2 for normal saturation.  CXR on 6/1 with pulmonary edema with L>R airspace disease d/t atelectasis, PNA, or edema.  Needs further workup to determine cause of new O2 requirement.  Will treat for volume overload now. - Lasix 40 mg IV once - repeat CXR  Pancytopenia 2/2 Methotrexate toxicity with mucositis, improving.  WBC 1.0>2.7> 8.6>18.8>24.3, Hgb 8.1, Plt 244. ANC 0.6>5.0>13.2. Current transfusion threshold is 7.0 per Dr. Marin Olp - Heme/onc (Dr. Marin Olp) following, stop Neupogen since at goal [place] >1500 ([place] has surpassed this, Neupogen now stopped). S/p Nplate x1 on 2/59, s/p granix x1 on 5/24. - on ciprofloxacin/metronidazole (5/24-) for AoC diverticulitis - switched to PO abx on 6/2 - s/p acyclovir/diflucan (5/63-8/75) - Daily folic acid supplementation - Daily CBC with  differential - On Aranesp 112mg weekly - lidocaine/maalox/[place] mouth washes prn - per Hem-Onc: WBC level is normal with receiving Neupogen, transfuse if Hemoglobin drops on 6/4  Fever, resolved:  Last documented fever 102.86F on 5/31, continues to be afebrile and asymptomatic since then. At increased risk for infection due to immunosuppression so will have low threshold to broaden ABX. - BCx and UCx NG final.  - monitor fever curve - on ciprofloxacin/metronidazole per above   Acute on chronic diverticulitis, improving. Stable. Abdominal pain resolved and tolerating po.  - On IV Cipro and Flagyl (5/23-), transitioned to po 6/2 to complete 14 day course  Hypomagnesemia, resolved:  1.5 on 5/31 (s/p 4G 5/30). Additional 2g IV given on 5/31. 1.9 on 6/3 - recheck Mg   Stomatitis with ulceration, resolved:  Stable, now no longer painful.  - Wound care consulted, ostomy pouch with good seal, can change dressing prn.  Anemia of chronic inflammation:  On iron tablets at home.  Iron studies support adequate iron stores and high saturation ratio.  Ferritin elevated 431. - Holding home ferrous sulfate 325 mg daily, will restart on d/c   Rheumatoid arthritis: Chronic. Stable.  Recently switched rheumatologist and patient is on methotrexate.  Patient presenting with toxicity due to taking daily methotrexate. - Holding home methotrexate 2.5 mg every Friday in light of toxicity, see plan #1 above - Continue folate supplementation    Hypothyroidism: Chronic.  On Synthroid at home. - Continue home Synthroid 50 mcg daily    Hyperlipidemia: Chronic.  On simvastatin and daily aspirin at home. - Continue home simvastatin 40 mg daily - Restart home aspirin 325 mg daily as outpatient  Chronic pain:  Medications include Norco 10-325 every 4 hours as  needed and NSAIDs. - Tylenol 650 mg q6h prn, ibuprofen 600 mg q6h prn for mild-mod pain - Norco 10-359m q4prn for severe pain - Holding home  naproxen  FEN/GI: heart healthy diet Prophylaxis: lovenox  Disposition: Likely DC 6/4 if doing well on po ABX and off of O2. HHOT/HHPT with 24h sup.  Subjective:  Patient states that she feels well but does endorse L shoulder pain.  Would like to go home today.  Objective: Temp:  [97.8 F (36.6 C)-99.5 F (37.5 C)] 99.5 F (37.5 C) (06/03 0323) Pulse Rate:  [81-90] 84 (06/03 0323) Resp:  [14-23] 20 (06/03 0323) BP: (92-137)/(49-61) 133/60 (06/03 0323) SpO2:  [92 %-95 %] 95 % (06/03 0323) Weight:  [265 lb 6.9 oz (120.4 kg)] 265 lb 6.9 oz (120.4 kg) (06/03 0500) Physical Exam: General:  pleasant female in NAD lying in bed, nasal canula in place, O2 at 2L Cardiovascular: RRR, 1/6 murmur, no rubs or gallops Respiratory: CTAB, no rhonchi, wheezes, or crackles Gastrointestinal: obese, soft, slight tenderness to palpation at colostomy site, stoma with good seal and no leaking or erythema or skin breakdown Neuro: grossly normal Psych: AOx3, appropriate  Laboratory: Recent Labs  Lab 06/27/17 0621 06/28/17 0808 06/29/17 0509  WBC 8.4 18.8* 24.5*  HGB 8.2* 8.1* 7.6*  HCT 26.6* 26.3* 24.3*  PLT 160 244 282   Recent Labs  Lab 06/26/17 0446 06/27/17 0621 06/28/17 0808 06/29/17 0509  NA 139 139 137 142  K 3.4* 3.3* 3.8 4.3  CL 108 104 104 106  CO2 _0 BUN <5* <5* 9 10  CREATININE 0.99 1.21* 1.32* 1.23*  CALCIUM 8.3* 8.3* 8.4* 8.7*  PROT 5.5* 5.6* 5.7*  --   BILITOT 0.5 0.6 0.5  --   ALKPHOS 39 43 57  --   ALT 14 12* 8*  --   AST _1 --   GLUCOSE 103* 114* 113* 92    6/2 ANC 13.2K 5/24 Folate 1125 5/24 Vitamin B12 191 (low end of normal) 5/24 Iron and TIBC/ferritin (anemia of chronic inflammation) 5/24 HIV antibody nonreactive 5/23 Urinalysis with microscopy cloudy, small bilirubin, moderate leuks  Imaging/Diagnostic Tests: CT ABDOMEN AND PELVIS WITH CONTRAST (06/18/2017) 1. Acute diverticulitis of a portion of the proximal colon contained within  the ventral abdominal hernia. No abscess or free intraperitoneal air. 2. Large ventral abdominal hernia containing small bowel and colon without obstruction. 3. 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 No obstruction. 4.  Aortic Atherosclerosis.  No results found.  WKathrene Alu MD 06/29/2017, 6:52 AM PGY-1, [place] pager: [phone] text pages welcome

## 2017-06-29 NOTE — Progress Notes (Signed)
Pt ambulated to bathroom in am. SAT 91% RA but drops to 89 %  without Gore with 02  2 l/min sat is at 95 %. Colostomy leaking dressing change done.

## 2017-06-30 DIAGNOSIS — D61810 Antineoplastic chemotherapy induced pancytopenia: Secondary | ICD-10-CM | POA: Diagnosis not present

## 2017-06-30 DIAGNOSIS — Z9981 Dependence on supplemental oxygen: Secondary | ICD-10-CM

## 2017-06-30 DIAGNOSIS — K9409 Other complications of colostomy: Secondary | ICD-10-CM | POA: Diagnosis not present

## 2017-06-30 DIAGNOSIS — M069 Rheumatoid arthritis, unspecified: Secondary | ICD-10-CM | POA: Diagnosis not present

## 2017-06-30 DIAGNOSIS — D702 Other drug-induced agranulocytosis: Secondary | ICD-10-CM | POA: Diagnosis not present

## 2017-06-30 DIAGNOSIS — T451X1A Poisoning by antineoplastic and immunosuppressive drugs, accidental (unintentional), initial encounter: Secondary | ICD-10-CM | POA: Diagnosis not present

## 2017-06-30 DIAGNOSIS — Z933 Colostomy status: Secondary | ICD-10-CM | POA: Diagnosis not present

## 2017-06-30 DIAGNOSIS — D61818 Other pancytopenia: Secondary | ICD-10-CM | POA: Diagnosis not present

## 2017-06-30 DIAGNOSIS — K5792 Diverticulitis of intestine, part unspecified, without perforation or abscess without bleeding: Secondary | ICD-10-CM | POA: Diagnosis not present

## 2017-06-30 LAB — CBC WITH DIFFERENTIAL/PLATELET
BASOS ABS: 0 10*3/uL (ref 0.0–0.1)
Basophils Relative: 0 %
EOS PCT: 0 %
Eosinophils Absolute: 0 10*3/uL (ref 0.0–0.7)
HEMATOCRIT: 23.5 % — AB (ref 36.0–46.0)
HEMOGLOBIN: 7.3 g/dL — AB (ref 12.0–15.0)
LYMPHS PCT: 10 %
Lymphs Abs: 2.9 10*3/uL (ref 0.7–4.0)
MCH: 27.1 pg (ref 26.0–34.0)
MCHC: 31.1 g/dL (ref 30.0–36.0)
MCV: 87.4 fL (ref 78.0–100.0)
MONOS PCT: 19 %
Monocytes Absolute: 5.5 10*3/uL — ABNORMAL HIGH (ref 0.1–1.0)
NEUTROS ABS: 20.6 10*3/uL — AB (ref 1.7–7.7)
Neutrophils Relative %: 71 %
Platelets: 286 10*3/uL (ref 150–400)
RBC: 2.69 MIL/uL — ABNORMAL LOW (ref 3.87–5.11)
RDW: 16.1 % — ABNORMAL HIGH (ref 11.5–15.5)
WBC: 29 10*3/uL — ABNORMAL HIGH (ref 4.0–10.5)

## 2017-06-30 LAB — PREPARE RBC (CROSSMATCH)

## 2017-06-30 LAB — RETICULOCYTES
RBC.: 2.68 MIL/uL — ABNORMAL LOW (ref 3.87–5.11)
RETIC CT PCT: 1.6 % (ref 0.4–3.1)
Retic Count, Absolute: 42.9 10*3/uL (ref 19.0–186.0)

## 2017-06-30 MED ORDER — SODIUM CHLORIDE 0.9 % IV SOLN: Freq: Once | INTRAVENOUS | Status: DC

## 2017-06-30 MED ORDER — FUROSEMIDE 10 MG/ML IJ SOLN
20.0000 mg | Freq: Once | INTRAMUSCULAR | Status: AC
  Administered 2017-07-01: 20 mg via INTRAVENOUS
  Filled 2017-06-30: qty 4

## 2017-06-30 MED ORDER — FUROSEMIDE 10 MG/ML IJ SOLN
20.0000 mg | Freq: Once | INTRAMUSCULAR | Status: AC
  Administered 2017-06-30: 20 mg via INTRAVENOUS
  Filled 2017-06-30: qty 4

## 2017-06-30 NOTE — Progress Notes (Signed)
[name] is feeling okay.  The mucositis on her lips has healed up now.  Her white cell count and platelets have come back quite well.  I would not be surprised if the white cells go up a little bit more.  She got a double dose of Neupogen for several days.  Her hemoglobin is continuing to drop.  There is no reticulocyte count.  I suspect that she is still not that much.  I do believe that 2 units of blood would help her.  Her appetite is okay.  She still is taking liquids and.  I guess her diverticulitis is still being treated.  She still is on antibiotics for this.  She has had no fever.  There is no chemical studies yet today.  Depending on her reticulocyte count, a dose of Procrit might be helpful.  I would use Procrit over Aranesp.  We really need to "kick start" her bone marrow if her reticulocyte count is low.  Aranesp is more longer acting.  She has had no obvious bleeding.  There is no diarrhea.  On her physical exam, she is afebrile.  Her temperature is actually 99.6.  Pulse 86.  Blood pressure 103/82.  There is really nothing new on her physical exam.  Again the mucositis on her lips is resolved.  Again, I think that she her marrow is beginning to recover from the methotrexate overdose.  It is her erythropoiesis that is the problem right now.  We will see what her reticulocyte count is.  I know that she is getting fantastic care from all the staff on 3 W.  She does look much better.  Lattie Haw, MD  Darlyn Chamber 17:14

## 2017-06-30 NOTE — Consult Note (Signed)
WOC in to assess patient today. She is sleeping but awakes easily.  Her pouch is intact still from 24 hours and she reports her skin is not burring.   I will check on her in the morning to possibly change pouch prior to her DC if she is to be DC home.  [place], [place], [place]

## 2017-06-30 NOTE — Progress Notes (Signed)
Physical Therapy Treatment Patient Details Name: [name] MRN: [id number] DOB: 20-May-1945 Today's Date: 06/30/2017    History of Present Illness [name] is a 72 y.o. female presenting with diverticulitis and pancytopenia secondary to methotrexate toxicity.PMH is significant for rheumatoid arthritis, h/o chronic diverticulitis with colostomy, hypothyroidism, HTN, HLD, CAD, peripheral edema.    PT Comments    Ms. Georgiou very pleasant this afternoon. PT session limited to within room mobility as patient was receiving blood transfusion during session. O2 sat. on supplemental O2 at 97% - O2 removal for in room ambulation with sats dropping to low 80's - O2 reapplied with improvement in O2 levels. Doing well and making good progress towards goals, however, slightly limited today due to fatigue. Will continue to follow acutely to maximize mobility.    Follow Up Recommendations  Supervision/Assistance - 24 hour;Home health PT     Equipment Recommendations  None recommended by PT    Recommendations for Other Services OT consult     Precautions / Restrictions Precautions Precautions: Fall;Other (comment) Precaution Comments: neutropenic  Restrictions Weight Bearing Restrictions: No    Mobility  Bed Mobility Overal bed mobility: Needs Assistance Bed Mobility: Supine to Sit     Supine to sit: Min assist;Min guard     General bed mobility comments: patient using PT to pull up on to come to EOB  Transfers Overall transfer level: Needs assistance Equipment used: Rolling walker (2 wheeled) Transfers: Sit to/from Stand Sit to Stand: Min guard         General transfer comment: Min guard for safety and line management - VC for hand placement as pateint prefers to pull up from RW.   Ambulation/Gait Ambulation/Gait assistance: Min guard Ambulation Distance (Feet): 60 Feet Assistive device: Rolling walker (2 wheeled) Gait Pattern/deviations: Step-through pattern;Decreased  stride length;Wide base of support Gait velocity: Decreased   General Gait Details: ambulation short distance in room from bed to restroom and then to recliner - patient without supplemental O2 during short ambulation with noted increased fatigue   Stairs             Wheelchair Mobility    Modified Rankin (Stroke Patients Only)       Balance Overall balance assessment: Needs assistance Sitting-balance support: Feet supported;No upper extremity supported Sitting balance-Leahy Scale: Fair     Standing balance support: Bilateral upper extremity supported;During functional activity Standing balance-Leahy Scale: Fair Standing balance comment: use of RW for gait, able to leave RW at restroom door and reach for grab bars in restroom, able to stand at sink without UE support for hand hygeine                            Cognition Arousal/Alertness: Awake/alert Behavior During Therapy: [facility] for tasks assessed/performed Overall Cognitive Status: Within Functional Limits for tasks assessed                                        Exercises      General Comments        Pertinent Vitals/Pain Pain Assessment: No/denies pain    Home Living                      Prior Function            PT Goals (current goals can now be found  in the care plan section) Acute Rehab PT Goals Patient Stated Goal: Pt wants to go home  PT Goal Formulation: With patient Time For Goal Achievement: 07/08/17 Potential to Achieve Goals: Fair Progress towards PT goals: Progressing toward goals    Frequency    Min 3X/week      PT Plan Current plan remains appropriate    Co-evaluation              AM-PAC PT "6 Clicks" Daily Activity  Outcome Measure  Difficulty turning over in bed (including adjusting bedclothes, sheets and blankets)?: A Little Difficulty moving from lying on back to sitting on the side of the bed? : Unable Difficulty sitting  down on and standing up from a chair with arms (e.g., wheelchair, bedside commode, etc,.)?: Unable Help needed moving to and from a bed to chair (including a wheelchair)?: A Little Help needed walking in hospital room?: A Little Help needed climbing 3-5 steps with a railing? : A Lot 6 Click Score: 13    End of Session Equipment Utilized During Treatment: Gait belt Activity Tolerance: Patient tolerated treatment well Patient left: in chair;with call bell/phone within reach Nurse Communication: Mobility status PT Visit Diagnosis: Unsteadiness on feet (R26.81);Muscle weakness (generalized) (M62.81);Other abnormalities of gait and mobility (R26.89)     Time: 7902-4097 PT Time Calculation (min) (ACUTE ONLY): 19 min  Charges:  $Gait Training: 8-22 mins                    G Codes:        Lanney Gins, PT, DPT 06/30/17 2:15 PM

## 2017-06-30 NOTE — Progress Notes (Addendum)
Family Medicine Teaching Service Daily Progress Note Intern Pager: [phone]  Patient name: [name] record number: [id number] Date of birth: Feb 01, 1945 Age: 72 y.o. Gender: female  Primary Care Provider: Dixie Dials, MD Consultants: Hemonc, Wound care Code Status: Full  Pt Overview and Major Events to Date:  5/23 Admitted for fever and pancytopenia found to have diverticulitis on CT abd/pelvis 5/24 Found to have methotrexate toxicity, transfused 1u RBC 5/27 Initiated Neupogen per Dr. Marin Olp recs (Hemonc) 5/28 Started EPO 5/29 First measurable reticulocytes on labs 6/2 New oxygen requirement 6/4 Transfused 2 U RBCs d/t Hgb 7.3  Assessment and Plan: [name] is a 72 y.o. female presenting with diverticulitis and pancytopenia secondary to methotrexate toxicity.PMH is significant for rheumatoid arthritis, h/o chronic diverticulitis with colostomy, hypothyroidism, HTN, HLD, CAD, peripheral edema.  New O2 requirement. Uncertain etiology, although current anemia and possible high viscosity of blood with recent proliferation could be contributing.  Likely some background lung disease given smoking history and albuterol use.  CXR on 6/3 negative for acute cardiopulmonary disease.  Given Lasix 40 mg IV once on 6/3.  Still on 3L O2 on 6/4. - Lasix 40 mg IV again on 6/4 - obtain echo  Pancytopenia 2/2 Methotrexate toxicity with mucositis, improving.  WBC has recovered well due to Neupogen (now 29, which is expected in this setting), however Hgb still low and continuing to decrease, 7.6>7.3 in last 24 hours.  Reticulocytes 1.6 on 6/4, elevated from 0.5 on 5/29, but not compensating for low Hgb currently. - Per oncology recommendations, will transfuse 2 L RBCs.  Procrit recommended but not on hospital formulary - on PO ciprofloxacin/metronidazole (5/24-6/8) for AoC diverticulitis  - Daily folic acid supplementation - Daily CBC with differential   Acute on chronic diverticulitis,  improving. Stable. Abdominal pain resolved and tolerating po.  - On IV Cipro and Flagyl (5/23-), transitioned to po 6/2 to complete 14 day course  Hypomagnesemia, resolved:  1.5 on 5/31 (s/p 4G 5/30). Additional 2g IV given on 5/31. 1.9 on 6/3 - recheck Mg   Stomatitis with ulceration, resolved:  Stable, now no longer painful.  - Wound care consulted, ostomy pouch with good seal, can change dressing prn.  Anemia of chronic inflammation:  On iron tablets at home.  Iron studies support adequate iron stores and high saturation ratio.  Ferritin elevated 431. - Holding home ferrous sulfate 325 mg daily, consider whether to restart on d/c due to elevated Ferritin   Rheumatoid arthritis: Chronic. Stable.  Recently switched rheumatologist and patient is on methotrexate.  Patient presenting with toxicity due to taking daily methotrexate. - Holding home methotrexate 2.5 mg every Friday in light of toxicity, see plan #1 above - Continue folate supplementation    Hypothyroidism: Chronic.  On Synthroid at home. - Continue home Synthroid 50 mcg daily    Hyperlipidemia: Chronic.  On simvastatin and daily aspirin at home. - Continue home simvastatin 40 mg daily - Restart home aspirin 325 mg daily as outpatient  Chronic pain:  Medications include Norco 10-325 every 4 hours as needed and NSAIDs. - Tylenol 650 mg q6h prn, ibuprofen 600 mg q6h prn for mild-mod pain - Norco 10-391m q4prn for severe pain - Holding home naproxen  FEN/GI: heart healthy diet Prophylaxis: lovenox  Disposition: continued inpatient stay  Subjective:  Patient states that she feels fine this morning and understands that she will be transfused RBCs today.  Objective: Temp:  [98.8 F (37.1 C)-99.8 F (37.7 C)] 99.6 F (37.6  C) (06/04 0410) Pulse Rate:  [83-87] 86 (06/04 0410) Resp:  [16-20] 20 (06/04 0410) BP: (103-133)/(56-82) 103/82 (06/04 0410) SpO2:  [88 %-100 %] 98 % (06/04 0410) Physical Exam: General:   pleasant female walking with assistance and walker to get to restroom Cardiovascular: RRR, 1/6 murmur, no rubs or gallops; not short of breath on room air  Respiratory: CTAB, no rhonchi, wheezes, or crackles Gastrointestinal: obese, soft, slight tenderness to palpation at colostomy site Neuro: grossly normal Psych: AOx3, appropriate  Laboratory: Recent Labs  Lab 06/28/17 0808 06/29/17 0509 06/30/17 0451  WBC 18.8* 24.5* 29.0*  HGB 8.1* 7.6* 7.3*  HCT 26.3* 24.3* 23.5*  PLT 244 282 286   Recent Labs  Lab 06/26/17 0446 06/27/17 0621 06/28/17 0808 06/29/17 0509  NA 139 139 137 142  K 3.4* 3.3* 3.8 4.3  CL 108 104 104 106  CO2 _0 BUN <5* <5* 9 10  CREATININE 0.99 1.21* 1.32* 1.23*  CALCIUM 8.3* 8.3* 8.4* 8.7*  PROT 5.5* 5.6* 5.7*  --   BILITOT 0.5 0.6 0.5  --   ALKPHOS 39 43 57  --   ALT 14 12* 8*  --   AST _1 --   GLUCOSE 103* 114* 113* 92    6/2 ANC 13.2K 5/24 Folate 1125 5/24 Vitamin B12 191 (low end of normal) 5/24 Iron and TIBC/ferritin (anemia of chronic inflammation) 5/24 HIV antibody nonreactive 5/23 Urinalysis with microscopy cloudy, small bilirubin, moderate leuks  Imaging/Diagnostic Tests: CT ABDOMEN AND PELVIS WITH CONTRAST (06/18/2017) 1. Acute diverticulitis of a portion of the proximal colon contained within the ventral abdominal hernia. No abscess or free intraperitoneal air. 2. Large ventral abdominal hernia containing small bowel and colon without obstruction. 3. 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 No obstruction. 4.  Aortic Atherosclerosis.  Dg Chest 2 View  Result Date: 06/29/2017 CLINICAL DATA:  Oxygen dependence. EXAM: CHEST - 2 VIEW COMPARISON:  06/27/2017 and decubitus uses 06/29/2017. FINDINGS: Degraded exam secondary to patient arm position and body habitus. Right hemidiaphragm elevation. Suspect mild right-sided pleural thickening posteriorly. Midline trachea. Moderate cardiomegaly.  Atherosclerosis in the transverse aorta. No pneumothorax. No congestive failure. No lobar consolidation. IMPRESSION: No acute cardiopulmonary disease. Cardiomegaly without congestive failure. Electronically Signed   By: Abigail Miyamoto M.D.   On: 06/29/2017 20:20   Dg Chest Bilateral Decubitus  Result Date: 06/29/2017 CLINICAL DATA:  Shortness of breath.  Question pleural effusion. EXAM: CHEST - BILATERAL DECUBITUS VIEW COMPARISON:  PA and lateral chest 06/27/2017. FINDINGS: No pleural effusion is identified. IMPRESSION: As above. Electronically Signed   By: Inge Rise M.D.   On: 06/29/2017 13:14    Kathrene Alu, MD 06/30/2017, 7:27 AM PGY-1, [place] Intern pager: [phone], text pages welcome

## 2017-07-01 ENCOUNTER — Inpatient Hospital Stay (HOSPITAL_COMMUNITY): Payer: Medicare Other

## 2017-07-01 DIAGNOSIS — Z9981 Dependence on supplemental oxygen: Secondary | ICD-10-CM | POA: Diagnosis not present

## 2017-07-01 DIAGNOSIS — K5792 Diverticulitis of intestine, part unspecified, without perforation or abscess without bleeding: Secondary | ICD-10-CM | POA: Diagnosis not present

## 2017-07-01 DIAGNOSIS — I340 Nonrheumatic mitral (valve) insufficiency: Secondary | ICD-10-CM | POA: Diagnosis not present

## 2017-07-01 DIAGNOSIS — D702 Other drug-induced agranulocytosis: Secondary | ICD-10-CM | POA: Diagnosis not present

## 2017-07-01 DIAGNOSIS — T451X1A Poisoning by antineoplastic and immunosuppressive drugs, accidental (unintentional), initial encounter: Secondary | ICD-10-CM | POA: Diagnosis not present

## 2017-07-01 LAB — CBC WITH DIFFERENTIAL/PLATELET
BASOS ABS: 0 10*3/uL (ref 0.0–0.1)
BLASTS: 2 %
Band Neutrophils: 7 %
Basophils Relative: 0 %
EOS PCT: 0 %
Eosinophils Absolute: 0 10*3/uL (ref 0.0–0.7)
HEMATOCRIT: 30.4 % — AB (ref 36.0–46.0)
HEMOGLOBIN: 9.8 g/dL — AB (ref 12.0–15.0)
LYMPHS ABS: 4.4 10*3/uL — AB (ref 0.7–4.0)
Lymphocytes Relative: 13 %
MCH: 27.6 pg (ref 26.0–34.0)
MCHC: 32.2 g/dL (ref 30.0–36.0)
MCV: 85.6 fL (ref 78.0–100.0)
METAMYELOCYTES PCT: 1 %
MONOS PCT: 10 %
Monocytes Absolute: 3.4 10*3/uL — ABNORMAL HIGH (ref 0.1–1.0)
Myelocytes: 5 %
NEUTROS ABS: 25.4 10*3/uL — AB (ref 1.7–7.7)
NRBC: 0 /100{WBCs}
Neutrophils Relative %: 56 %
Other: 0 %
Platelets: 300 10*3/uL (ref 150–400)
Promyelocytes Relative: 6 %
RBC: 3.55 MIL/uL — AB (ref 3.87–5.11)
RDW: 15.9 % — AB (ref 11.5–15.5)
Smear Review: ADEQUATE
WBC: 33.8 10*3/uL — AB (ref 4.0–10.5)

## 2017-07-01 LAB — TYPE AND SCREEN
ABO/RH(D): B POS
Antibody Screen: NEGATIVE
Unit division: 0
Unit division: 0

## 2017-07-01 LAB — BPAM RBC
BLOOD PRODUCT EXPIRATION DATE: 201906192359
Blood Product Expiration Date: 201906192359
ISSUE DATE / TIME: 201906041142
ISSUE DATE / TIME: 201906041655
UNIT TYPE AND RH: 7300
Unit Type and Rh: 7300

## 2017-07-01 LAB — BASIC METABOLIC PANEL
ANION GAP: 11 (ref 5–15)
BUN: 10 mg/dL (ref 6–20)
CALCIUM: 8.7 mg/dL — AB (ref 8.9–10.3)
CHLORIDE: 100 mmol/L — AB (ref 101–111)
CO2: 30 mmol/L (ref 22–32)
CREATININE: 1.04 mg/dL — AB (ref 0.44–1.00)
GFR calc non Af Amer: 53 mL/min — ABNORMAL LOW (ref >60)
GLUCOSE: 92 mg/dL (ref 65–99)
Potassium: 3.7 mmol/L (ref 3.5–5.1)
Sodium: 141 mmol/L (ref 135–145)

## 2017-07-01 LAB — ECHOCARDIOGRAM COMPLETE
Height: 63 in
Weight: 4246.94 oz

## 2017-07-01 LAB — RETICULOCYTES
RBC.: 3.55 MIL/uL — ABNORMAL LOW (ref 3.87–5.11)
RETIC COUNT ABSOLUTE: 32 10*3/uL (ref 19.0–186.0)
Retic Ct Pct: 0.9 % (ref 0.4–3.1)

## 2017-07-01 MED ORDER — FUROSEMIDE 10 MG/ML IJ SOLN
40.0000 mg | Freq: Once | INTRAMUSCULAR | Status: AC
  Administered 2017-07-01: 40 mg via INTRAVENOUS
  Filled 2017-07-01: qty 4

## 2017-07-01 NOTE — Progress Notes (Signed)
Occupational Therapy Treatment Patient Details Name: [name] MRN: [id number] DOB: 10-Jun-1945 Today's Date: 07/01/2017    History of present illness [name] is a 72 y.o. female presenting with diverticulitis and pancytopenia secondary to methotrexate toxicity.PMH is significant for rheumatoid arthritis, h/o chronic diverticulitis with colostomy, hypothyroidism, HTN, HLD, CAD, peripheral edema.   OT comments  Pt continues to fatigue quickly with activity. Required min guard assist for functional mobility and grooming tasks in standing. SpO2 94% on 2L following bed mobility; 90% on 2L after OOB activity; DOE 2/4. Educated pt on pursed lip breathing and energy conservation strategies; pt verbalized understanding. D/c plan remains appropriate. Will continue to follow acutely.   Follow Up Recommendations  Home health OT    Equipment Recommendations  None recommended by OT    Recommendations for Other Services      Precautions / Restrictions Precautions Precautions: Fall;Other (comment) Precaution Comments: neutropenic  Restrictions Weight Bearing Restrictions: No       Mobility Bed Mobility Overal bed mobility: Needs Assistance Bed Mobility: Supine to Sit;Sit to Supine     Supine to sit: Min guard Sit to supine: Min guard   General bed mobility comments: Pt using bed rails to assist with bed mobility, HOB flat. Increased time and effort required  Transfers Overall transfer level: Needs assistance Equipment used: Rolling walker (2 wheeled) Transfers: Sit to/from Stand Sit to Stand: Min guard         General transfer comment: Good hand placement and technique. Min guard for safety    Balance Overall balance assessment: Needs assistance Sitting-balance support: Feet supported;No upper extremity supported Sitting balance-Leahy Scale: Good     Standing balance support: No upper extremity supported;During functional activity Standing balance-Leahy Scale: Fair                              ADL either performed or assessed with clinical judgement   ADL Overall ADL's : Needs assistance/impaired     Grooming: Min guard;Standing;Wash/dry hands               Lower Body Dressing: Supervision/safety Lower Body Dressing Details (indicate cue type and reason): to adjust socks in sitting Toilet Transfer: Min guard;Ambulation;Regular Toilet;Grab bars;RW   Toileting- Water quality scientist and Hygiene: Min guard;Sit to/from stand       Functional mobility during ADLs: Min guard;Rolling walker General ADL Comments: SpO2 94% on 2L following bed mobility; 90% on 2L following OOB activity. Educated pt on energy conservation strategies and pursed lip breathing.     Vision       Perception     Praxis      Cognition Arousal/Alertness: Awake/alert Behavior During Therapy: WFL for tasks assessed/performed Overall Cognitive Status: Within Functional Limits for tasks assessed                                          Exercises     Shoulder Instructions       General Comments      Pertinent Vitals/ Pain       Pain Assessment: Faces Faces Pain Scale: Hurts little more Pain Location: L shoulder Pain Descriptors / Indicators: Aching Pain Intervention(s): Monitored during session;Limited activity within patient's tolerance  Home Living  Prior Functioning/Environment              Frequency  Min 2X/week        Progress Toward Goals  OT Goals(current goals can now be found in the care plan section)  Progress towards OT goals: Progressing toward goals  Acute Rehab OT Goals Patient Stated Goal: Home OT Goal Formulation: With patient  Plan Discharge plan remains appropriate    Co-evaluation                 AM-PAC PT "6 Clicks" Daily Activity     Outcome Measure   Help from another person eating meals?: None Help from another  person taking care of personal grooming?: A Little Help from another person toileting, which includes using toliet, bedpan, or urinal?: A Little Help from another person bathing (including washing, rinsing, drying)?: A Little Help from another person to put on and taking off regular upper body clothing?: A Little Help from another person to put on and taking off regular lower body clothing?: A Little 6 Click Score: 19    End of Session Equipment Utilized During Treatment: Rolling walker;Oxygen  OT Visit Diagnosis: Unsteadiness on feet (R26.81);Pain Pain - Right/Left: Left Pain - part of body: Shoulder   Activity Tolerance Patient tolerated treatment well   Patient Left in bed;with call bell/phone within reach   Nurse Communication          Time: 7001-7494 OT Time Calculation (min): 21 min  Charges: OT General Charges $OT Visit: 1 Visit OT Treatments $Self Care/Home Management : 8-22 mins  [name] A. Ulice Brilliant, M.S., OTR/L Acute Rehab Department: [phone]   [name] 07/01/2017, 10:48 AM

## 2017-07-01 NOTE — Care Management Note (Signed)
Case Management Note  Patient Details  Name: [name] MRN: [id number] Date of Birth: 1945/07/23  Subjective/Objective:                    Action/Plan: Plan is for patient to d/c home when medically ready. CM following.  Expected Discharge Date:                  Expected Discharge Plan:  Home/Self Care  In-House Referral:     Discharge planning Services  CM Consult  Post Acute Care Choice:  Home Health Choice offered to:  Patient  DME Arranged:    DME Agency:     HH Arranged:  PT, OT HH Agency:  [facility]  Status of Service:  In process, will continue to follow  If discussed at Long Length of Stay Meetings, dates discussed:    Additional Comments:  Pollie Friar, RN 07/01/2017, 2:20 PM

## 2017-07-01 NOTE — Progress Notes (Signed)
  Echocardiogram 2D Echocardiogram has been performed.  [name] 07/01/2017, 1:42 PM

## 2017-07-01 NOTE — Progress Notes (Signed)
Family Medicine Teaching Service Daily Progress Note Intern Pager: [phone]  Patient name: [name] record number: [id number] Date of birth: [birth year] Age: 72 y.o. Gender: female  Primary Care Provider: Dixie Dials, MD Consultants: Hemonc, Wound care Code Status: Full  Pt Overview and Major Events to Date:  5/23 Admitted for fever and pancytopenia found to have diverticulitis on CT abd/pelvis 5/24 Found to have methotrexate toxicity, transfused 1u RBC 5/27 Initiated Neupogen per Dr. Marin Olp recs (Hemonc) 5/28 Started EPO 5/29 First measurable reticulocytes on labs 6/2 New oxygen requirement 6/4 Transfused 2 U RBCs d/t Hgb 7.3  Assessment and Plan: [name] is a 72 y.o. female presenting with diverticulitis and pancytopenia secondary to methotrexate toxicity.PMH is significant for rheumatoid arthritis, h/o chronic diverticulitis with colostomy, hypothyroidism, HTN, HLD, CAD, peripheral edema.  New O2 requirement. Uncertain etiology, although anemia could be contributing.  Likely some background lung disease given smoking history and albuterol use; COPD and CHF are on differential.  CXR on 6/3 negative for acute cardiopulmonary disease.  Still on 3L O2 on 6/5. - obtain echo  Pancytopenia 2/2 Methotrexate toxicity with mucositis, improving.  WBC has recovered well due to Neupogen (now 33, which is expected in this setting), however Hgb still low on 6/4, so was transfused 2U RBCs on 6/4.  Hemoglobin on 6/5 is 9.8.  Reticulocytes 0.9 on 6/5, elevated from 0.5 on 5/29, but not compensating for low Hgb. - on PO ciprofloxacin/metronidazole (5/23-6/6) for AoC diverticulitis  - Daily folic acid supplementation   Acute on chronic diverticulitis, improving. Stable. Abdominal pain resolved and tolerating po.  - On IV Cipro and Flagyl (5/24-), transitioned to po 6/2 to complete 14 day course (end date 6/6)  Hypomagnesemia, resolved:  1.5 on 5/31 (s/p 4G 5/30). Additional 2g IV  given on 5/31. 1.9 on 6/3  Stomatitis with ulceration, resolved:  Stable, now no longer painful.  - Wound care consulted, ostomy pouch with good seal, can change dressing prn.  Anemia of chronic inflammation:  On iron tablets at home.  Iron studies support adequate iron stores and high saturation ratio.  Ferritin elevated to 431. - Holding home ferrous sulfate 325 mg daily, consider whether to restart on d/c due to elevated Ferritin   Rheumatoid arthritis: Chronic. Stable.  Recently switched rheumatologist and patient is on methotrexate.  Patient presenting with toxicity due to taking daily methotrexate. - Holding home methotrexate 2.5 mg every Friday in light of toxicity, see plan #1 above - Continue folate supplementation    Hypothyroidism: Chronic.  On Synthroid at home. - Continue home Synthroid 50 mcg daily    Hyperlipidemia: Chronic.  On simvastatin and daily aspirin at home. - Continue home simvastatin 40 mg daily - Restart home aspirin 325 mg daily as outpatient  Chronic pain:  Medications include Norco 10-325 every 4 hours as needed and NSAIDs. - Tylenol 650 mg q6h prn, ibuprofen 600 mg q6h prn for mild-mod pain - Norco 10-311m q4prn for severe pain - Holding home naproxen  FEN/GI: heart healthy diet Prophylaxis: lovenox  Disposition: continued inpatient stay  Subjective:  Patient states that she feels fine this morning and is anxious to go home, although she understands that she needs workup for her new O2 requirement today.  Denies shortness of breath.  Objective: Temp:  [98.2 F (36.8 C)-99.1 F (37.3 C)] 99 F (37.2 C) (06/05 0410) Pulse Rate:  [68-86] 81 (06/05 0410) Resp:  [16-20] 18 (06/05 0410) BP: (116-156)/(58-75) 149/69 (06/05 0410) SpO2:  [  96 %-100 %] 97 % (06/05 0410) Physical Exam: General:  pleasant female lying comfortably in bed, nasal canula in place, on 3L O2 (I weaned to 2L in the room)  Cardiovascular: regular rhythm, normal rate, 1/6  morning Respiratory: CTAB, no rhonchi, wheezes, or crackles Gastrointestinal: obese, soft, nontender to palpation, colostomy site c/d/i Neuro: grossly normal Psych: AOx3, appropriate  Laboratory: Recent Labs  Lab 06/28/17 0808 06/29/17 0509 06/30/17 0451  WBC 18.8* 24.5* 29.0*  HGB 8.1* 7.6* 7.3*  HCT 26.3* 24.3* 23.5*  PLT 244 282 286   Recent Labs  Lab 06/26/17 0446 06/27/17 0621 06/28/17 0808 06/29/17 0509  NA 139 139 137 142  K 3.4* 3.3* 3.8 4.3  CL 108 104 104 106  CO2 _0 BUN <5* <5* 9 10  CREATININE 0.99 1.21* 1.32* 1.23*  CALCIUM 8.3* 8.3* 8.4* 8.7*  PROT 5.5* 5.6* 5.7*  --   BILITOT 0.5 0.6 0.5  --   ALKPHOS 39 43 57  --   ALT 14 12* 8*  --   AST _1 --   GLUCOSE 103* 114* 113* 92    6/2 ANC 13.2K 5/24 Folate 1125 5/24 Vitamin B12 191 (low end of normal) 5/24 Iron and TIBC/ferritin (anemia of chronic inflammation) 5/24 HIV antibody nonreactive 5/23 Urinalysis with microscopy cloudy, small bilirubin, moderate leuks  Imaging/Diagnostic Tests: CT ABDOMEN AND PELVIS WITH CONTRAST (06/18/2017) 1. Acute diverticulitis of a portion of the proximal colon contained within the ventral abdominal hernia. No abscess or free intraperitoneal air. 2. Large ventral abdominal hernia containing small bowel and colon without obstruction. 3. 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 No obstruction. 4.  Aortic Atherosclerosis.  No results found.  Kathrene Alu, MD 07/01/2017, 7:13 AM PGY-1, [place] Intern pager: [phone], text pages welcome

## 2017-07-02 DIAGNOSIS — M069 Rheumatoid arthritis, unspecified: Secondary | ICD-10-CM | POA: Diagnosis not present

## 2017-07-02 DIAGNOSIS — D702 Other drug-induced agranulocytosis: Secondary | ICD-10-CM | POA: Diagnosis not present

## 2017-07-02 DIAGNOSIS — K9409 Other complications of colostomy: Secondary | ICD-10-CM | POA: Diagnosis not present

## 2017-07-02 DIAGNOSIS — D61810 Antineoplastic chemotherapy induced pancytopenia: Secondary | ICD-10-CM | POA: Diagnosis not present

## 2017-07-02 DIAGNOSIS — Z9981 Dependence on supplemental oxygen: Secondary | ICD-10-CM | POA: Diagnosis not present

## 2017-07-02 DIAGNOSIS — K5792 Diverticulitis of intestine, part unspecified, without perforation or abscess without bleeding: Secondary | ICD-10-CM | POA: Diagnosis not present

## 2017-07-02 DIAGNOSIS — Z933 Colostomy status: Secondary | ICD-10-CM | POA: Diagnosis not present

## 2017-07-02 DIAGNOSIS — T451X1A Poisoning by antineoplastic and immunosuppressive drugs, accidental (unintentional), initial encounter: Secondary | ICD-10-CM | POA: Diagnosis not present

## 2017-07-02 LAB — CBC WITH DIFFERENTIAL/PLATELET
BLASTS: 0 %
Band Neutrophils: 0 %
Basophils Absolute: 0 10*3/uL (ref 0.0–0.1)
Basophils Relative: 0 %
Eosinophils Absolute: 0 10*3/uL (ref 0.0–0.7)
Eosinophils Relative: 0 %
HEMATOCRIT: 32.9 % — AB (ref 36.0–46.0)
HEMOGLOBIN: 10.4 g/dL — AB (ref 12.0–15.0)
Lymphocytes Relative: 8 %
Lymphs Abs: 3.2 10*3/uL (ref 0.7–4.0)
MCH: 27.5 pg (ref 26.0–34.0)
MCHC: 31.6 g/dL (ref 30.0–36.0)
MCV: 87 fL (ref 78.0–100.0)
MONO ABS: 7.7 10*3/uL — AB (ref 0.1–1.0)
MYELOCYTES: 0 %
Metamyelocytes Relative: 0 %
Monocytes Relative: 19 %
NEUTROS PCT: 73 %
NRBC: 0 /100{WBCs}
Neutro Abs: 29.6 10*3/uL — ABNORMAL HIGH (ref 1.7–7.7)
OTHER: 0 %
PROMYELOCYTES RELATIVE: 0 %
Platelets: 321 10*3/uL (ref 150–400)
RBC: 3.78 MIL/uL — AB (ref 3.87–5.11)
RDW: 15.9 % — ABNORMAL HIGH (ref 11.5–15.5)
WBC: 40.5 10*3/uL — AB (ref 4.0–10.5)

## 2017-07-02 LAB — BASIC METABOLIC PANEL
ANION GAP: 13 (ref 5–15)
BUN: 9 mg/dL (ref 6–20)
CHLORIDE: 96 mmol/L — AB (ref 101–111)
CO2: 31 mmol/L (ref 22–32)
Calcium: 8.7 mg/dL — ABNORMAL LOW (ref 8.9–10.3)
Creatinine, Ser: 1.1 mg/dL — ABNORMAL HIGH (ref 0.44–1.00)
GFR calc Af Amer: 58 mL/min — ABNORMAL LOW (ref >60)
GFR, EST NON AFRICAN AMERICAN: 50 mL/min — AB (ref >60)
GLUCOSE: 45 mg/dL — AB (ref 65–99)
POTASSIUM: 3.6 mmol/L (ref 3.5–5.1)
Sodium: 140 mmol/L (ref 135–145)

## 2017-07-02 LAB — CULTURE, BLOOD (ROUTINE X 2)
Culture: NO GROWTH
Culture: NO GROWTH
SPECIAL REQUESTS: ADEQUATE
Special Requests: ADEQUATE

## 2017-07-02 LAB — RETICULOCYTES
RBC.: 3.78 MIL/uL — AB (ref 3.87–5.11)
RETIC COUNT ABSOLUTE: 22.7 10*3/uL (ref 19.0–186.0)
RETIC CT PCT: 0.6 % (ref 0.4–3.1)

## 2017-07-02 LAB — PATHOLOGIST SMEAR REVIEW

## 2017-07-02 MED ORDER — CIPROFLOXACIN HCL 500 MG PO TABS: 500.0000 mg | ORAL_TABLET | Freq: Two times a day (BID) | ORAL | 0 refills | Status: DC

## 2017-07-02 MED ORDER — MELOXICAM 7.5 MG PO TABS: 7.5000 mg | ORAL_TABLET | Freq: Every day | ORAL | 0 refills | Status: DC

## 2017-07-02 MED ORDER — METRONIDAZOLE 500 MG PO TABS: 500.0000 mg | ORAL_TABLET | Freq: Three times a day (TID) | ORAL | 0 refills | Status: DC

## 2017-07-02 MED ORDER — MELOXICAM 7.5 MG PO TABS: 7.5000 mg | ORAL_TABLET | Freq: Every day | ORAL | Status: DC

## 2017-07-02 MED ORDER — FUROSEMIDE 10 MG/ML IJ SOLN
40.0000 mg | Freq: Once | INTRAMUSCULAR | Status: AC
  Administered 2017-07-02: 40 mg via INTRAVENOUS
  Filled 2017-07-02: qty 4

## 2017-07-02 MED ORDER — FUROSEMIDE 20 MG PO TABS: 40.0000 mg | ORAL_TABLET | Freq: Every day | ORAL | 0 refills | Status: DC

## 2017-07-02 NOTE — Discharge Summary (Signed)
[facility] Discharge Summary  Patient name: [place] record number: [id number] Date of birth: Jul 23, 1945 Age: 72 y.o. Gender: female Date of Admission: 06/18/2017  Date of Discharge: 07/02/17 Admitting Physician: Zenia Resides, MD  Primary Care Provider: Dixie Dials, MD Consultants: Hem-Onc, Wound Care  Indication for Hospitalization: acute on chronic diverticulitis, pancytopenia d/t methotrexate toxicity.  Found to have new oxygen requirement during hospitalization.  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, improved Acute on chronic diverticulitis Anemia of chronic inflammation Rheumatoid arthritis Hypothyroidism Hyperlipidemia Chronic pain  Disposition: home  Discharge Condition: stable, improved, still with oxygen requirement  Discharge Exam:  General:  NAD, sleeping but easily awoken, lying in bed, [place] in place on 3L O2 Cardiovascular: regular rhythm, normal rate, 1/6 morning Respiratory: CTAB, no rhonchi, wheezes, or crackles Gastrointestinal: obese, soft, nontender to palpation, colostomy site draining under wall suction Neuro: grossly normal Psych: AOx3, appropriate  Brief Hospital Course:  Ms. Lykins was admitted on 5/23 for fever and cytopenia and was found to have diverticulitis on CT abd/pelvis.  She was started on IV cipro/flagyl, and her cytopenia was attributed to methotrexate toxicity when she reported that she had been taking the weekly dose every day.  Dr. Marin Olp with Hem-Onc was consulted, and he recommended starting Neupogen and Aranesp to bolster her cell lines.  Her WBC count rebounded well in response to the Neuopogen, but she did need 2 units of pRBCs due to a hemoglobin of 7.3.  Her hemoglobin remained improved after transfusion and was 10.4 on day of discharge.  Methotrexate was discontinued at discharge and patient was started on meloxicam 7.5 mg daily with  instructions to stop all other NSAIDs.    On 6/2, patient desaturated to 84% while ambulating and required oxygen for the rest of her hospital stay.  She was diuresed with IV Lasix but continued to need oxygen.  An echo was performed, which showed an EF of 65-70% and pulmonary hypertension, with pulmonary artery pressure of 52 mmHg.  Patient was diuresed more after this diagnosis was made and was sent home with oxygen.  Her pulmonary hypertension was thought to be due to likely COPD, although patient was unaware of any previous diagnosis of this.  Issues for Follow Up:  1. Patient will need BMP to check electrolytes and kidney function after initiating higher dose of Lasix. 2. Patient recently changed rheumatologists and needs close follow up with her new rheumatologist regarding her discontinuation of methotrexate and other steps going forward. 3. Patient needs a CBC to check on her cell lines after her methotrexate toxicity. 4. Patient needs PFTs to determine whether she has COPD or another cause of her pulmonary hypertension.  She would benefit from continued home oxygen or phosphodiesterase inhibitors/other vasodilators for this diagnosis.  Significant Procedures: echo, blood transfusion  Significant Labs and Imaging:  Recent Labs  Lab 06/30/17 0451 07/01/17 0718 07/02/17 0330  WBC 29.0* 33.8* 40.5*  HGB 7.3* 9.8* 10.4*  HCT 23.5* 30.4* 32.9*  PLT 286 300 321   Recent Labs  Lab 06/26/17 0446 06/27/17 0621 06/27/17 0850 06/28/17 0808 06/29/17 0509 07/01/17 0718 07/02/17 0330  NA 139 139  --  137 142 141 140  K 3.4* 3.3*  --  3.8 4.3 3.7 3.6  CL 108 104  --  104 106 100* 96*  CO2 27 26  --  _0 GLUCOSE 103* 114*  --  113* 92 92 45*  BUN <5* <5*  --  _0 CREATININE 0.99 1.21*  --  1.32* 1.23* 1.04* 1.10*  CALCIUM 8.3* 8.3*  --  8.4* 8.7* 8.7* 8.7*  MG 1.5*  --  1.6*  --  1.9  --   --   PHOS 3.5  --   --   --   --   --   --   ALKPHOS 39 43  --  57  --   --    --   AST 16 18  --  16  --   --   --   ALT 14 12*  --  8*  --   --   --   ALBUMIN 2.3* 2.2*  --  2.1*  --   --   --     Dg Chest 2 View  Result Date: 06/29/2017 CLINICAL DATA:  Oxygen dependence. EXAM: CHEST - 2 VIEW COMPARISON:  06/27/2017 and decubitus uses 06/29/2017. FINDINGS: Degraded exam secondary to patient arm position and body habitus. Right hemidiaphragm elevation. Suspect mild right-sided pleural thickening posteriorly. Midline trachea. Moderate cardiomegaly. Atherosclerosis in the transverse aorta. No pneumothorax. No congestive failure. No lobar consolidation. IMPRESSION: No acute cardiopulmonary disease. Cardiomegaly without congestive failure. Electronically Signed   By: Abigail Miyamoto M.D.   On: 06/29/2017 20:20   Dg Chest 2 View  Result Date: 06/27/2017 CLINICAL DATA:  Fever and shortness of breath. EXAM: CHEST - 2 VIEW COMPARISON:  Single-view of the chest 06/18/2017. PA and lateral chest 10/22/2015. FINDINGS: Elevation of the right hemidiaphragm relative to the left is again seen. Bibasilar airspace disease is worse on the left. There is cardiomegaly and interstitial edema. No pneumothorax. No pleural effusion. Aortic atherosclerosis is noted. No acute bony abnormality. IMPRESSION: Pulmonary edema. Left worse than right basilar airspace disease could be due to atelectasis, pneumonia or more intense pulmonary edema. Cardiomegaly. Atherosclerosis. Electronically Signed   By: Inge Rise M.D.   On: 06/27/2017 11:43   Dg Chest Bilateral Decubitus  Result Date: 06/29/2017 CLINICAL DATA:  Shortness of breath.  Question pleural effusion. EXAM: CHEST - BILATERAL DECUBITUS VIEW COMPARISON:  PA and lateral chest 06/27/2017. FINDINGS: No pleural effusion is identified. IMPRESSION: As above. Electronically Signed   By: Inge Rise M.D.   On: 06/29/2017 13:14   Ct Abdomen Pelvis W Contrast  Result Date: 06/18/2017 CLINICAL DATA:  Abdominal pain and fever EXAM: CT ABDOMEN AND PELVIS  WITH CONTRAST TECHNIQUE: Multidetector CT imaging of the abdomen and pelvis was performed using the standard protocol following bolus administration of intravenous contrast. CONTRAST:  182m OMNIPAQUE IOHEXOL 300 MG/ML  SOLN COMPARISON:  07/05/2009 FINDINGS: LOWER CHEST: Focal atelectasis at the right lung base. Coronary artery calcifications. HEPATOBILIARY: Normal hepatic contours and density. No intra- or extrahepatic biliary dilatation. Status post cholecystectomy. PANCREAS: Generalized pancreatic atrophy. SPLEEN: Normal. ADRENALS/URINARY TRACT: --Adrenal glands: Normal. --Right kidney/ureter: No hydronephrosis, nephroureterolithiasis, perinephric stranding or solid renal mass. --Left kidney/ureter: No hydronephrosis, nephroureterolithiasis, perinephric stranding or solid renal mass. --Urinary bladder: Nondistended. STOMACH/BOWEL: --Stomach/Duodenum: No hiatal hernia or other gastric abnormality. Normal duodenal course. --Small bowel: Large ventral abdominal hernia containing multiple loops of nondilated small bowel. There is a surgical anastomosis within the hernia. --Colon: A segment of the transverse colon is contained within the ventral abdominal hernia. There is diverticulosis of this segment with mild surrounding acute inflammation. Status post HJeanette Capriceprocedure with left lower quadrant colostomy. --Appendix: Not visualized. No right lower quadrant inflammation or free fluid. VASCULAR/LYMPHATIC: Atherosclerotic calcification is  present within the non-aneurysmal abdominal aorta, without hemodynamically significant stenosis. The portal vein, splenic vein, superior mesenteric vein and IVC are patent. No abdominal or pelvic lymphadenopathy. REPRODUCTIVE: Status post hysterectomy. No adnexal mass. MUSCULOSKELETAL. No bony spinal canal stenosis or focal osseous abnormality. OTHER: None. IMPRESSION: 1. Acute diverticulitis of a portion of the proximal colon contained within the ventral abdominal hernia. No  abscess or free intraperitoneal air. 2. Large ventral abdominal hernia containing small bowel and colon without obstruction. 3. 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 No obstruction. 4.  Aortic Atherosclerosis (ICD10-I70.0). Electronically Signed   By: Ulyses Jarred M.D.   On: 06/18/2017 20:17   Dg Chest Port 1 View  Result Date: 06/18/2017 CLINICAL DATA:  72 year old female with pain and swelling at her colostomy stoma. EXAM: PORTABLE CHEST 1 VIEW COMPARISON:  Prior chest x-ray 10/22/2015 FINDINGS: The patient is rotated to the right which distorts the cardiac and mediastinal contours. Within these limits, no significant interval change. Atherosclerotic calcifications are present in the transverse aorta. Chronic elevation of the right hemidiaphragm with slightly increased right basilar atelectasis. No pulmonary edema, pleural effusion or pneumothorax. No suspicious nodule or mass. No acute osseous abnormality. IMPRESSION: Given rightward rotated position, no acute cardiopulmonary process. Electronically Signed   By: Jacqulynn Cadet M.D.   On: 06/18/2017 19:12   Dg Shoulder Left  Result Date: 06/24/2017 CLINICAL DATA:  Chronic shoulder pain.  Decreased range of motion EXAM: LEFT SHOULDER - 2+ VIEW COMPARISON:  02/02/2009 FINDINGS: Mild inferior glenohumeral spurring. No joint narrowing on the axillary view. No evidence of fracture or malalignment. No evidence of bone lesion. IMPRESSION: Mild glenohumeral spurring without joint narrowing. Electronically Signed   By: Monte Fantasia M.D.   On: 06/24/2017 16:23     Results/Tests Pending at Time of Discharge: none  Discharge Medications:  Allergies as of 07/02/2017   No Known Allergies     Medication List    STOP taking these medications   etodolac 400 MG tablet Commonly known as:  LODINE   methotrexate 2.5 MG tablet   metoprolol tartrate 25 MG tablet Commonly known as:  LOPRESSOR   naproxen sodium 220 MG  tablet Commonly known as:  ALEVE     TAKE these medications   aspirin 325 MG tablet Commonly known as:  BAYER ASPIRIN Take 1 tablet (325 mg total) by mouth daily. Take daily for 4 wks   ciprofloxacin 500 MG tablet Commonly known as:  CIPRO Take 1 tablet (500 mg total) by mouth 2 (two) times daily.   COMBIVENT RESPIMAT 20-100 MCG/ACT Aers respimat Generic drug:  Ipratropium-Albuterol Inhale 1 puff into the lungs 2 (two) times daily.   cycloSPORINE 0.05 % ophthalmic emulsion Commonly known as:  RESTASIS Place 1 drop into both eyes 2 (two) times daily.   ferrous sulfate 325 (65 FE) MG tablet Take 325 mg by mouth daily.   folic acid 1 MG tablet Commonly known as:  FOLVITE Take 1 mg by mouth daily.   furosemide 20 MG tablet Commonly known as:  LASIX Take 2 tablets (40 mg total) by mouth daily. What changed:  how much to take   HYDROcodone-acetaminophen 10-325 MG tablet Commonly known as:  NORCO Take 1 tablet by mouth every 4 (four) hours as needed for severe pain.   levothyroxine 50 MCG tablet Commonly known as:  SYNTHROID, LEVOTHROID Take 50 mcg by mouth daily.   meloxicam 7.5 MG tablet Commonly known as:  MOBIC Take 1 tablet (7.5 mg  total) by mouth daily. Start taking on:  07/03/2017   metroNIDAZOLE 500 MG tablet Commonly known as:  FLAGYL Take 1 tablet (500 mg total) by mouth every 8 (eight) hours.   simvastatin 40 MG tablet Commonly known as:  ZOCOR Take 40 mg by mouth daily.            Durable Medical Equipment  (From admission, onward)        Start     Ordered   07/01/17 1528  For home use only DME oxygen  Once    Question Answer Comment  Mode or (Route) Nasal cannula   Liters per Minute 2   Frequency Continuous (stationary and portable oxygen unit needed)   Oxygen delivery system Gas      07/01/17 1527      Discharge Instructions: Please refer to Patient Instructions section of EMR for full details.  Patient was counseled important signs and  symptoms that should prompt return to medical care, changes in medications, dietary instructions, activity restrictions, and follow up appointments.   Follow-Up Appointments: Follow-up Information    [facility], [place], [place]. Schedule an appointment as soon as possible for a visit on 07/06/2017.   Specialty:  Physician Assistant Contact information: [place] [address] [phone]           Kathrene Alu, MD 07/02/2017, 12:41 PM PGY-1, [place]

## 2017-07-02 NOTE — Progress Notes (Signed)
Physical Therapy Treatment Patient Details Name: [name] MRN: [id number] DOB: 30-Jan-1945 Today's Date: 07/02/2017    History of Present Illness [name] is a 72 y.o. female presenting with diverticulitis and pancytopenia secondary to methotrexate toxicity.PMH is significant for rheumatoid arthritis, h/o chronic diverticulitis with colostomy, hypothyroidism, HTN, HLD, CAD, peripheral edema.    PT Comments    Patient required supervision/min guard assist and increased time for all mobility. Pt continues to present with 2/4 DOE with mobility and is generally deconditioned. Pt required supplemental O2 throughout session due to SpO2 desat to 77% on RA with mobility (see separate saturation qualifications note). Pt educated on importance of being OOB during the day, use of IS, and walking schedule upon d/c home. Pt will need 24 hour supervision/assistance and will continue to benefit from further skilled PT services to maximize independence and safety with mobility.   Follow Up Recommendations  Supervision/Assistance - 24 hour;Home health PT     Equipment Recommendations  None recommended by PT    Recommendations for Other Services OT consult     Precautions / Restrictions Precautions Precautions: Fall;Other (comment) Precaution Comments: neutropenic  Restrictions Weight Bearing Restrictions: No    Mobility  Bed Mobility Overal bed mobility: Needs Assistance Bed Mobility: Supine to Sit     Supine to sit: Supervision     General bed mobility comments: increased time and effort; use of rail  Transfers Overall transfer level: Needs assistance Equipment used: Rolling walker (2 wheeled) Transfers: Sit to/from Stand Sit to Stand: Min guard         General transfer comment: min guard for safety  Ambulation/Gait Ambulation/Gait assistance: Min guard Ambulation Distance (Feet): 65 Feet Assistive device: Rolling walker (2 wheeled) Gait Pattern/deviations: Step-through  pattern;Wide base of support;Decreased stride length Gait velocity: Decreased Gait velocity interpretation: <1.31 ft/sec, indicative of household ambulator General Gait Details: pt with 2/4 DOE and educated on PLB technique; standing rest breaks required    Stairs             Wheelchair Mobility    Modified Rankin (Stroke Patients Only)       Balance Overall balance assessment: Needs assistance Sitting-balance support: Feet supported;No upper extremity supported Sitting balance-Leahy Scale: Good     Standing balance support: During functional activity;Bilateral upper extremity supported Standing balance-Leahy Scale: Fair Standing balance comment: pt reliant on UE support for ambulation                             Cognition Arousal/Alertness: Awake/alert Behavior During Therapy: WFL for tasks assessed/performed Overall Cognitive Status: Within Functional Limits for tasks assessed                                        Exercises      General Comments General comments (skin integrity, edema, etc.): SpO2 desat to 77% on RA with mobility; pt educated on importance of being OOB during the day, walking schedule, and use of IS upon d/c      Pertinent Vitals/Pain Pain Assessment: Faces Faces Pain Scale: Hurts a little bit Pain Location: L shoulder Pain Descriptors / Indicators: Aching Pain Intervention(s): Monitored during session;Premedicated before session    Home Living                      Prior Function  PT Goals (current goals can now be found in the care plan section) Acute Rehab PT Goals Patient Stated Goal: Home PT Goal Formulation: With patient Time For Goal Achievement: 07/08/17 Potential to Achieve Goals: Fair Progress towards PT goals: Progressing toward goals    Frequency    Min 3X/week      PT Plan Current plan remains appropriate    Co-evaluation              AM-PAC PT "6 Clicks"  Daily Activity  Outcome Measure  Difficulty turning over in bed (including adjusting bedclothes, sheets and blankets)?: A Little Difficulty moving from lying on back to sitting on the side of the bed? : Unable Difficulty sitting down on and standing up from a chair with arms (e.g., wheelchair, bedside commode, etc,.)?: Unable Help needed moving to and from a bed to chair (including a wheelchair)?: A Little Help needed walking in hospital room?: A Little Help needed climbing 3-5 steps with a railing? : A Lot 6 Click Score: 13    End of Session Equipment Utilized During Treatment: Gait belt Activity Tolerance: Patient tolerated treatment well Patient left: in chair;with call bell/phone within reach Nurse Communication: Mobility status PT Visit Diagnosis: Unsteadiness on feet (R26.81);Muscle weakness (generalized) (M62.81);Other abnormalities of gait and mobility (R26.89)     Time: 6629-4765 PT Time Calculation (min) (ACUTE ONLY): 28 min  Charges:  $Gait Training: 8-22 mins $Therapeutic Activity: 8-22 mins                    G Codes:       Earney Navy, PTA Pager: [phone]     Darliss Cheney 07/02/2017, 11:13 AM

## 2017-07-02 NOTE — Progress Notes (Signed)
The echocardiogram looks okay.  She has a good ejection fraction.  Her white cell count is still trending upward.  Again, I have to believe that this will improve.  His likely that the Neupogen is still in her system.  Her platelet count is doing well.  It is 321,000.  Her hemoglobin is 10.6.  She still having some breathing issues.  I suppose that she may have some leukostasis that could be causing some pulmonary issues.  If that is the case, this should improve as her white cell count starts to improve and decrease with further time.  Her electrolytes also look okay.  She is still on the Cipro and Flagyl for the diverticulitis.  I will know if these need to be discontinued right now.  I do not see that I need to make any other suggestions right now.  Lattie Haw, MD    Job 19:25

## 2017-07-02 NOTE — Discharge Instructions (Signed)
For your breathing, we are sending you home with oxygen.  Please use this, since it will help you feel better and will be healthier for your lungs.  Please also take two pills of Lasix each morning.  You can stop taking this medication if your weight drops below 240 lbs.  For your RA, please stop taking methotrexate.  Please start taking meloxicam, one pill per day.  Please do not take ibuprofen or naproxen, since these drugs can interact with meloxicam.  Continue to take one aspirin 325 mg per day since this will protect your heart.  For your diverticulitis, please take one pill of Flagyl (metronidazole) and one pill of Ciprofloxacin this evening.  You will be done with your antibiotic course after that.  It is very important that you make an appointment to see your doctor in the next one or two weeks, since she will make sure that you are continuing to get better and that the new medications we are starting are working well for you.

## 2017-07-02 NOTE — Progress Notes (Signed)
Pt with orders for home oxygen. CM spoke with patient and she would like to use [facility] for her home oxygen. [name] with [facility] notified. Pt already has [place] arranged with [facility] and RN/Aide added and [name] aware.

## 2017-07-02 NOTE — Progress Notes (Signed)
SATURATION QUALIFICATIONS: (This note is used to comply with regulatory documentation for home oxygen)  Patient Saturations on Room Air at Rest = 90%  Patient Saturations on Room Air while Ambulating = 77%  Patient Saturations on 2 Liters of oxygen while Ambulating = 92%  Please briefly explain why patient needs home oxygen: patient is unable to maintain SpO2 saturation 90% or > without supplemental O2.   Earney Navy, PTA Pager: [phone]

## 2017-07-02 NOTE — Progress Notes (Addendum)
Family Medicine Teaching Service Daily Progress Note Intern Pager: [phone]  Patient name: [name] record number: [id number] Date of birth: May 29, 1945 Age: 72 y.o. Gender: female  Primary Care Provider: Dixie Dials, MD Consultants: [place], Wound care Code Status: Full  Pt Overview and Major Events to Date:  5/23 Admitted for fever and pancytopenia found to have diverticulitis on CT abd/pelvis 5/24 Found to have methotrexate toxicity, transfused 1u RBC 5/27 Initiated Neupogen per Dr. Marin Olp recs ([facility]) 5/28 Started EPO 5/29 First measurable reticulocytes on labs 6/2 New oxygen requirement 6/4 Transfused 2 U RBCs d/t Hgb 7.3 6/5 found to have pulmonary hypertension on echo with EF 65-70%  Assessment and Plan: [name] is a 72 y.o. female presenting with diverticulitis and pancytopenia secondary to methotrexate toxicity.PMH is significant for rheumatoid arthritis, h/o chronic diverticulitis with colostomy, hypothyroidism, HTN, HLD, CAD, peripheral edema.  New O2 requirement. Likely some background lung disease given smoking history and albuterol use; COPD is on the differential, although leukostasis and newfound pulmonary hypertension are likely contributing.  Still on 3L O2 on 6/5. - give Lasix 40 mg IV again on 6/6 - home with O2 - will need PFTs, further workup for pulmonary hypertension outpatient - will send home with Lasix 40 mg daily, patient can stop if weight drops below 240 lb at home  Pancytopenia 2/2 Methotrexate toxicity with mucositis, improving.  WBC has recovered well due to Neupogen (now 40.5, which is expected in this setting), Hgb 10.4 and reticulocytes 0.6 on 6/6. - Daily folic acid supplementation - will discontinue Methotrexate at discharge   Acute on chronic diverticulitis, improving. Stable. Abdominal pain resolved and tolerating po.  - On IV Cipro and Flagyl (5/24-), transitioned to po 6/2 to complete 14 day course (end date  6/6)  Hypomagnesemia, resolved:  1.5 on 5/31 (s/p 4G 5/30). Additional 2g IV given on 5/31. 1.9 on 6/3  Stomatitis with ulceration, resolved:  Stable, now no longer painful.  - Wound care consulted, ostomy pouch with good seal, can change dressing prn.  Anemia of chronic inflammation:  On iron tablets at home.  Iron studies support adequate iron stores and high saturation ratio.  Ferritin elevated to 431. - Holding home ferrous sulfate 325 mg daily, consider whether to restart on d/c due to elevated Ferritin   Rheumatoid arthritis: Chronic. Stable.  Recently switched rheumatologist and patient is on methotrexate.  Patient presenting with toxicity due to taking daily methotrexate. - Discontinuing methotrexate, will send home on meloxicam 7.5 mg daily, will stop naproxen - Continue folate supplementation    Hypothyroidism: Chronic.  On Synthroid at home. - Continue home Synthroid 50 mcg daily    Hyperlipidemia: Chronic.  On simvastatin and daily aspirin at home. - Continue home simvastatin 40 mg daily - Restart home aspirin 325 mg daily as outpatient  Chronic pain:  Medications include Norco 10-325 every 4 hours as needed and NSAIDs. - Tylenol 650 mg q6h prn, ibuprofen 600 mg q6h prn for mild-mod pain - Norco 10-378m q4prn for severe pain - Holding home naproxen  FEN/GI: heart healthy diet Prophylaxis: lovenox  Disposition: home 6/6  Subjective:  Patient states that she feels fine and would like to go home if we think it is appropriate.  She understands that she needs to make a follow up appointment with her doctor to get further workup of her pulmonary hypertension and possible other lung disease.  Objective: Temp:  [98.2 F (36.8 C)-101.4 F (38.6 C)] 98.3 F (36.8 C) (  06/06 0420) Pulse Rate:  [78-88] 85 (06/06 0420) Resp:  [18-20] 18 (06/06 0420) BP: (126-144)/(47-64) 126/62 (06/06 0420) SpO2:  [92 %-100 %] 97 % (06/06 0420) Weight:  [248 lb 10.9 oz (112.8 kg)] 248  lb 10.9 oz (112.8 kg) (06/06 0500) Physical Exam: General:  NAD, sleeping but easily awoken, lying in bed, [place] in place on 3L O2 Cardiovascular: regular rhythm, normal rate, 1/6 murmur, trace pedal edema bilaterally Respiratory: CTAB, no rhonchi, wheezes, or crackles Gastrointestinal: obese, soft, nontender to palpation, colostomy site draining under wall suction Neuro: grossly normal Psych: AOx3, appropriate  Laboratory: Recent Labs  Lab 06/30/17 0451 07/01/17 0718 07/02/17 0330  WBC 29.0* 33.8* 40.5*  HGB 7.3* 9.8* 10.4*  HCT 23.5* 30.4* 32.9*  PLT 286 300 321   Recent Labs  Lab 06/26/17 0446 06/27/17 0621 06/28/17 0808 06/29/17 0509 07/01/17 0718  NA 139 139 137 142 141  K 3.4* 3.3* 3.8 4.3 3.7  CL 108 104 104 106 100*  CO2 _0 BUN <5* <5* _1 CREATININE 0.99 1.21* 1.32* 1.23* 1.04*  CALCIUM 8.3* 8.3* 8.4* 8.7* 8.7*  PROT 5.5* 5.6* 5.7*  --   --   BILITOT 0.5 0.6 0.5  --   --   ALKPHOS 39 43 57  --   --   ALT 14 12* 8*  --   --   AST _2 --   --   GLUCOSE 103* 114* 113* 92 92    6/2 ANC 13.2K 5/24 Folate 1125 5/24 Vitamin B12 191 (low end of normal) 5/24 Iron and TIBC/ferritin (anemia of chronic inflammation) 5/24 HIV antibody nonreactive 5/23 Urinalysis with microscopy cloudy, small bilirubin, moderate leuks  Imaging/Diagnostic Tests: CT ABDOMEN AND PELVIS WITH CONTRAST (06/18/2017) 1. Acute diverticulitis of a portion of the proximal colon contained within the ventral abdominal hernia. No abscess or free intraperitoneal air. 2. Large ventral abdominal hernia containing small bowel and colon without obstruction. 3. 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 No obstruction. 4.  Aortic Atherosclerosis.  No results found.  Kathrene Alu, MD 07/02/2017, 7:21 AM PGY-1, [place] Intern pager: [phone], text pages welcome

## 2017-07-02 NOTE — Consult Note (Signed)
[facility] Nurse ostomy follow up Stoma type/location: LLQ colostomy Stomal assessment/size: 1 3/8" flush stoma with os at the bottom of stoma at the 5 o'clock area.  Peristomal assessment: much improved from previously described. Patient attributes healing from stoma powder.  Treatment options for stomal/peristomal skin: skin barrier film, stoma powder, barrier ring Output loose brown effluent Ostomy pouching: 1pc.with barrier ring and framed with strips at patient request , she brought strips from home. Education provided: patient talked about how she feels like the hole we cut in the barrier is too small. Explained rationale and how when she cuts large hole that is what allows stool to sit on skin and cause irritation.  Patient instructed on importance of using our pattern for home use. Pattern placed in take home item area at her request.  Enrolled patient in [facility] Discharge program: NA Pouches, barrier rings, powder, belt, skin barrier film, and pattern in room for take home use if she is discharged. Can be used by bedside RN if needed. WOC will continue to follow if patient is not discharged. Incidentally, patient's urine is the color of cola. Discussed with patient who said nurses have instructed her to drink a lot of water today. Patient has water at bedside. Fara Olden, RN-C, WTA-C, [facility] Ost[facility] Care Associate

## 2017-07-06 ENCOUNTER — Telehealth: Payer: Self-pay | Admitting: *Deleted

## 2017-07-06 NOTE — Telephone Encounter (Signed)
Since I am not [name]'s PCP, I unfortunately cannot prescribe her a hospital bed.  I know she was setting up with a new PCP (not in our clinic) at discharge, so she should ask that provider to look into the prescription.  Thanks!

## 2017-07-06 NOTE — Telephone Encounter (Signed)
Spoke to pt daughter. Gave her the info below. She will call Dr. Doylene Canard and if that doesn't work she will call back and get a new pt packet mailed to her. Ottis Stain, CMA

## 2017-07-06 NOTE — Telephone Encounter (Signed)
Pts daughter called and wants to know if we can give pt an Rx for a hospital bed since we saw her in the hospital.  Will forward to Dr. Shan Levans. Jadarian Mckay, Salome Spotted, CMA

## 2017-07-13 ENCOUNTER — Ambulatory Visit: Payer: Medicare Other | Admitting: Podiatry

## 2017-08-25 ENCOUNTER — Ambulatory Visit (INDEPENDENT_AMBULATORY_CARE_PROVIDER_SITE_OTHER): Payer: Medicare Other | Admitting: Orthopaedic Surgery

## 2017-10-12 ENCOUNTER — Other Ambulatory Visit: Payer: Self-pay | Admitting: Family Medicine

## 2018-08-12 ENCOUNTER — Other Ambulatory Visit: Payer: Self-pay | Admitting: Internal Medicine

## 2018-08-12 DIAGNOSIS — Z1231 Encounter for screening mammogram for malignant neoplasm of breast: Secondary | ICD-10-CM

## 2018-08-31 ENCOUNTER — Other Ambulatory Visit: Payer: Self-pay

## 2018-08-31 ENCOUNTER — Ambulatory Visit (INDEPENDENT_AMBULATORY_CARE_PROVIDER_SITE_OTHER): Payer: 59 | Admitting: Podiatry

## 2018-08-31 ENCOUNTER — Encounter: Payer: Self-pay | Admitting: Podiatry

## 2018-08-31 DIAGNOSIS — M79674 Pain in right toe(s): Secondary | ICD-10-CM | POA: Diagnosis not present

## 2018-08-31 DIAGNOSIS — M79675 Pain in left toe(s): Secondary | ICD-10-CM | POA: Diagnosis not present

## 2018-08-31 DIAGNOSIS — B351 Tinea unguium: Secondary | ICD-10-CM

## 2018-08-31 NOTE — Progress Notes (Signed)
Complaint:  Visit Type: Patient returns to my office for continued preventative foot care services. Complaint: Patient states" my nails have grown long and thick and become painful to walk and wear shoes" . The patient presents for preventative foot care services. No changes to ROS  Podiatric Exam: Vascular: dorsalis pedis and posterior tibial pulses are palpable bilateral. Capillary return is immediate. Temperature gradient is WNL. Skin turgor WNL  Sensorium: Normal Semmes Weinstein monofilament test. Normal tactile sensation bilaterally. Nail Exam: Pt has thick disfigured discolored nails with subungual debris noted bilateral entire nail hallux through fifth toenails Ulcer Exam: There is no evidence of ulcer or pre-ulcerative changes or infection. Orthopedic Exam: Muscle tone and strength are WNL. No limitations in general ROM. No crepitus or effusions noted. Foot type and digits show no abnormalities. Bony prominences are unremarkable. Skin: No Porokeratosis. No infection or ulcers  Diagnosis:  Onychomycosis, , Pain in right toe, pain in left toes  Treatment & Plan Procedures and Treatment: Consent by patient was obtained for treatment procedures.   Debridement of mycotic and hypertrophic toenails, 1 through 5 bilateral and clearing of subungual debris. No ulceration, no infection noted.  Return Visit-Office Procedure: Patient instructed to return to the office for a follow up visit 3 months for continued evaluation and treatment.    Gardiner Barefoot DPM

## 2018-10-12 ENCOUNTER — Ambulatory Visit: Payer: Medicare Other

## 2018-12-01 ENCOUNTER — Ambulatory Visit: Payer: 59 | Admitting: Podiatry

## 2019-02-15 ENCOUNTER — Ambulatory Visit: Payer: 59 | Admitting: Podiatry

## 2019-03-30 ENCOUNTER — Ambulatory Visit: Payer: 59 | Admitting: Podiatry

## 2019-08-01 ENCOUNTER — Encounter (HOSPITAL_COMMUNITY): Payer: Self-pay

## 2019-08-01 ENCOUNTER — Emergency Department (HOSPITAL_COMMUNITY): Payer: Medicare Other

## 2019-08-01 ENCOUNTER — Inpatient Hospital Stay (HOSPITAL_COMMUNITY): Payer: Medicare Other

## 2019-08-01 ENCOUNTER — Inpatient Hospital Stay (HOSPITAL_COMMUNITY)
Admission: EM | Admit: 2019-08-01 | Discharge: 2019-08-06 | DRG: 065 | Disposition: A | Discharge status: Skilled Nursing Facility | Payer: Medicare Other | Attending: Family Medicine | Admitting: Family Medicine

## 2019-08-01 DIAGNOSIS — I63412 Cerebral infarction due to embolism of left middle cerebral artery: Secondary | ICD-10-CM | POA: Diagnosis present

## 2019-08-01 DIAGNOSIS — Z87891 Personal history of nicotine dependence: Secondary | ICD-10-CM

## 2019-08-01 DIAGNOSIS — R29704 NIHSS score 4: Secondary | ICD-10-CM | POA: Diagnosis present

## 2019-08-01 DIAGNOSIS — M25511 Pain in right shoulder: Secondary | ICD-10-CM | POA: Diagnosis present

## 2019-08-01 DIAGNOSIS — I639 Cerebral infarction, unspecified: Secondary | ICD-10-CM | POA: Diagnosis present

## 2019-08-01 DIAGNOSIS — I129 Hypertensive chronic kidney disease with stage 1 through stage 4 chronic kidney disease, or unspecified chronic kidney disease: Secondary | ICD-10-CM | POA: Diagnosis present

## 2019-08-01 DIAGNOSIS — I6389 Other cerebral infarction: Secondary | ICD-10-CM

## 2019-08-01 DIAGNOSIS — I63441 Cerebral infarction due to embolism of right cerebellar artery: Principal | ICD-10-CM | POA: Diagnosis present

## 2019-08-01 DIAGNOSIS — Z7952 Long term (current) use of systemic steroids: Secondary | ICD-10-CM

## 2019-08-01 DIAGNOSIS — Q211 Atrial septal defect: Secondary | ICD-10-CM | POA: Diagnosis not present

## 2019-08-01 DIAGNOSIS — R29810 Facial weakness: Secondary | ICD-10-CM | POA: Diagnosis present

## 2019-08-01 DIAGNOSIS — G894 Chronic pain syndrome: Secondary | ICD-10-CM | POA: Diagnosis present

## 2019-08-01 DIAGNOSIS — E039 Hypothyroidism, unspecified: Secondary | ICD-10-CM | POA: Diagnosis present

## 2019-08-01 DIAGNOSIS — M069 Rheumatoid arthritis, unspecified: Secondary | ICD-10-CM | POA: Diagnosis present

## 2019-08-01 DIAGNOSIS — N182 Chronic kidney disease, stage 2 (mild): Secondary | ICD-10-CM | POA: Diagnosis present

## 2019-08-01 DIAGNOSIS — J449 Chronic obstructive pulmonary disease, unspecified: Secondary | ICD-10-CM | POA: Diagnosis present

## 2019-08-01 DIAGNOSIS — Z6841 Body Mass Index (BMI) 40.0 and over, adult: Secondary | ICD-10-CM

## 2019-08-01 DIAGNOSIS — Z79890 Hormone replacement therapy (postmenopausal): Secondary | ICD-10-CM

## 2019-08-01 DIAGNOSIS — I10 Essential (primary) hypertension: Secondary | ICD-10-CM | POA: Diagnosis not present

## 2019-08-01 DIAGNOSIS — R251 Tremor, unspecified: Secondary | ICD-10-CM | POA: Diagnosis present

## 2019-08-01 DIAGNOSIS — I63411 Cerebral infarction due to embolism of right middle cerebral artery: Secondary | ICD-10-CM | POA: Diagnosis not present

## 2019-08-01 DIAGNOSIS — I059 Rheumatic mitral valve disease, unspecified: Secondary | ICD-10-CM | POA: Diagnosis not present

## 2019-08-01 DIAGNOSIS — I340 Nonrheumatic mitral (valve) insufficiency: Secondary | ICD-10-CM | POA: Diagnosis not present

## 2019-08-01 DIAGNOSIS — I2721 Secondary pulmonary arterial hypertension: Secondary | ICD-10-CM

## 2019-08-01 DIAGNOSIS — Z90710 Acquired absence of both cervix and uterus: Secondary | ICD-10-CM

## 2019-08-01 DIAGNOSIS — E785 Hyperlipidemia, unspecified: Secondary | ICD-10-CM | POA: Diagnosis present

## 2019-08-01 DIAGNOSIS — I82451 Acute embolism and thrombosis of right peroneal vein: Secondary | ICD-10-CM | POA: Diagnosis present

## 2019-08-01 DIAGNOSIS — I350 Nonrheumatic aortic (valve) stenosis: Secondary | ICD-10-CM | POA: Diagnosis not present

## 2019-08-01 DIAGNOSIS — Z20822 Contact with and (suspected) exposure to covid-19: Secondary | ICD-10-CM | POA: Diagnosis present

## 2019-08-01 DIAGNOSIS — I272 Pulmonary hypertension, unspecified: Secondary | ICD-10-CM | POA: Diagnosis present

## 2019-08-01 DIAGNOSIS — I083 Combined rheumatic disorders of mitral, aortic and tricuspid valves: Secondary | ICD-10-CM | POA: Diagnosis present

## 2019-08-01 DIAGNOSIS — Z933 Colostomy status: Secondary | ICD-10-CM | POA: Diagnosis not present

## 2019-08-01 DIAGNOSIS — Z96653 Presence of artificial knee joint, bilateral: Secondary | ICD-10-CM | POA: Diagnosis present

## 2019-08-01 DIAGNOSIS — R7881 Bacteremia: Secondary | ICD-10-CM | POA: Diagnosis present

## 2019-08-01 DIAGNOSIS — Z79899 Other long term (current) drug therapy: Secondary | ICD-10-CM | POA: Diagnosis not present

## 2019-08-01 DIAGNOSIS — I351 Nonrheumatic aortic (valve) insufficiency: Secondary | ICD-10-CM | POA: Diagnosis not present

## 2019-08-01 DIAGNOSIS — I2510 Atherosclerotic heart disease of native coronary artery without angina pectoris: Secondary | ICD-10-CM | POA: Diagnosis present

## 2019-08-01 DIAGNOSIS — G8191 Hemiplegia, unspecified affecting right dominant side: Secondary | ICD-10-CM | POA: Diagnosis present

## 2019-08-01 DIAGNOSIS — Z86010 Personal history of colonic polyps: Secondary | ICD-10-CM

## 2019-08-01 LAB — I-STAT CHEM 8, ED
BUN: 24 mg/dL — ABNORMAL HIGH (ref 8–23)
Calcium, Ion: 1.17 mmol/L (ref 1.15–1.40)
Chloride: 107 mmol/L (ref 98–111)
Creatinine, Ser: 1.4 mg/dL — ABNORMAL HIGH (ref 0.44–1.00)
Glucose, Bld: 95 mg/dL (ref 70–99)
HCT: 37 % (ref 36.0–46.0)
Hemoglobin: 12.6 g/dL (ref 12.0–15.0)
Potassium: 4.3 mmol/L (ref 3.5–5.1)
Sodium: 144 mmol/L (ref 135–145)
TCO2: 20 mmol/L — ABNORMAL LOW (ref 22–32)

## 2019-08-01 LAB — DIFFERENTIAL
Abs Immature Granulocytes: 0.06 10*3/uL (ref 0.00–0.07)
Basophils Absolute: 0.1 10*3/uL (ref 0.0–0.1)
Basophils Relative: 1 %
Eosinophils Absolute: 0.2 10*3/uL (ref 0.0–0.5)
Eosinophils Relative: 3 %
Immature Granulocytes: 1 %
Lymphocytes Relative: 32 %
Lymphs Abs: 2.6 10*3/uL (ref 0.7–4.0)
Monocytes Absolute: 0.7 10*3/uL (ref 0.1–1.0)
Monocytes Relative: 8 %
Neutro Abs: 4.5 10*3/uL (ref 1.7–7.7)
Neutrophils Relative %: 55 %

## 2019-08-01 LAB — URINALYSIS, ROUTINE W REFLEX MICROSCOPIC
Bilirubin Urine: NEGATIVE
Glucose, UA: NEGATIVE mg/dL
Hgb urine dipstick: NEGATIVE
Ketones, ur: NEGATIVE mg/dL
Leukocytes,Ua: NEGATIVE
Nitrite: NEGATIVE
Protein, ur: 30 mg/dL — AB
Specific Gravity, Urine: 1.017 (ref 1.005–1.030)
pH: 5 (ref 5.0–8.0)

## 2019-08-01 LAB — RAPID URINE DRUG SCREEN, HOSP PERFORMED
Amphetamines: NOT DETECTED
Barbiturates: NOT DETECTED
Benzodiazepines: NOT DETECTED
Cocaine: NOT DETECTED
Opiates: POSITIVE — AB
Tetrahydrocannabinol: NOT DETECTED

## 2019-08-01 LAB — COMPREHENSIVE METABOLIC PANEL
ALT: 13 U/L (ref 0–44)
AST: 29 U/L (ref 15–41)
Albumin: 2.8 g/dL — ABNORMAL LOW (ref 3.5–5.0)
Alkaline Phosphatase: 47 U/L (ref 38–126)
Anion gap: 12 (ref 5–15)
BUN: 22 mg/dL (ref 8–23)
CO2: 20 mmol/L — ABNORMAL LOW (ref 22–32)
Calcium: 9.3 mg/dL (ref 8.9–10.3)
Chloride: 109 mmol/L (ref 98–111)
Creatinine, Ser: 1.54 mg/dL — ABNORMAL HIGH (ref 0.44–1.00)
GFR calc Af Amer: 39 mL/min — ABNORMAL LOW (ref >60)
GFR calc non Af Amer: 33 mL/min — ABNORMAL LOW (ref >60)
Glucose, Bld: 95 mg/dL (ref 70–99)
Potassium: 4.3 mmol/L (ref 3.5–5.1)
Sodium: 141 mmol/L (ref 135–145)
Total Bilirubin: 1 mg/dL (ref 0.3–1.2)
Total Protein: 6.9 g/dL (ref 6.5–8.1)

## 2019-08-01 LAB — CBC
HCT: 41 % (ref 36.0–46.0)
Hemoglobin: 11.4 g/dL — ABNORMAL LOW (ref 12.0–15.0)
MCH: 24.9 pg — ABNORMAL LOW (ref 26.0–34.0)
MCHC: 27.8 g/dL — ABNORMAL LOW (ref 30.0–36.0)
MCV: 89.5 fL (ref 80.0–100.0)
Platelets: 280 10*3/uL (ref 150–400)
RBC: 4.58 MIL/uL (ref 3.87–5.11)
RDW: 15.2 % (ref 11.5–15.5)
WBC: 8.1 10*3/uL (ref 4.0–10.5)
nRBC: 0 % (ref 0.0–0.2)

## 2019-08-01 LAB — PROTIME-INR
INR: 1.2 (ref 0.8–1.2)
Prothrombin Time: 14.5 seconds (ref 11.4–15.2)

## 2019-08-01 LAB — ETHANOL: Alcohol, Ethyl (B): <10 mg/dL (ref <10)

## 2019-08-01 LAB — CBG MONITORING, ED: Glucose-Capillary: 88 mg/dL (ref 70–99)

## 2019-08-01 LAB — SARS CORONAVIRUS 2 BY RT PCR (HOSPITAL ORDER, PERFORMED IN ~~LOC~~ HOSPITAL LAB): SARS Coronavirus 2: NEGATIVE

## 2019-08-01 LAB — APTT: aPTT: 27 seconds (ref 24–36)

## 2019-08-01 IMAGING — CT CT HEAD W/O CM
3 of 4 series · 13 of 47 positions shown, 15 images · non-contrast
Comparison: None.

CLINICAL DATA: Woke up with right hand weakness

EXAM:
CT HEAD WITHOUT CONTRAST
CT CERVICAL SPINE WITHOUT CONTRAST
TECHNIQUE: Multidetector CT imaging of the head and cervical spine was
performed following the standard protocol without intravenous
contrast. Multiplanar CT image reconstructions of the cervical spine
were also generated.

[Series 3: head without · axial · non-contrast · 0.47mm/px · z∈[-136,-46]mm · 7 of 25 slices shown, 9 images]
[im 4/25  brain]
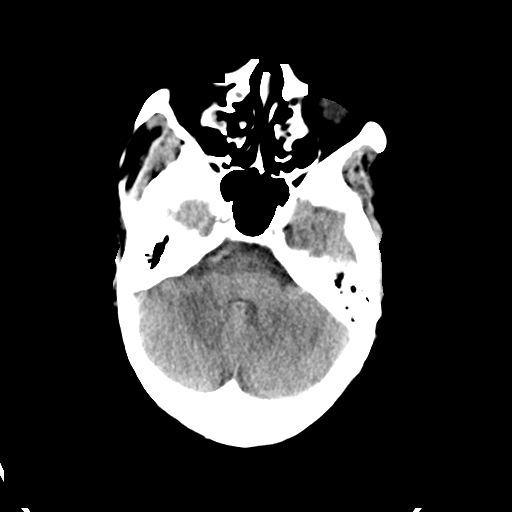
[im 4/25  bone]
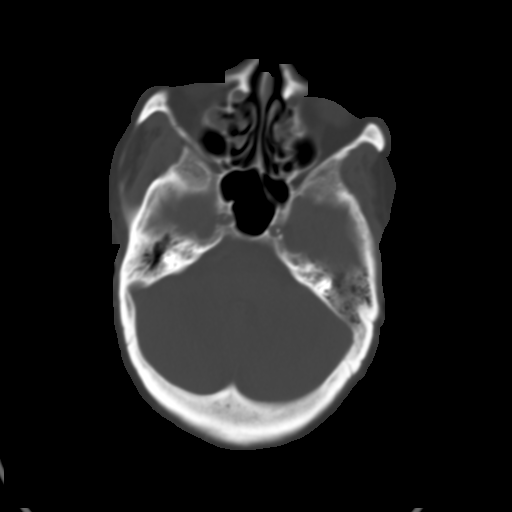
[im 7/25  brain]
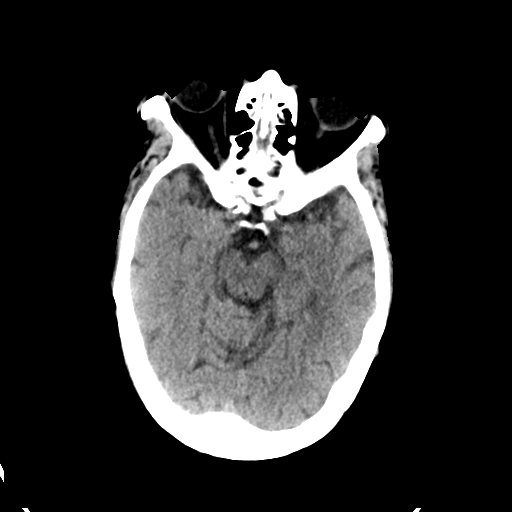
[im 10/25  brain]
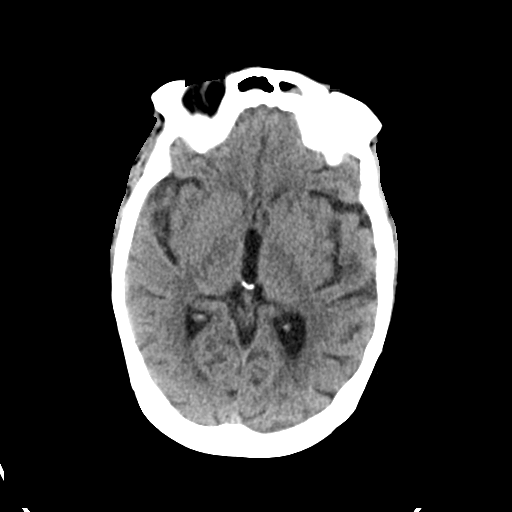
[im 13/25  brain]
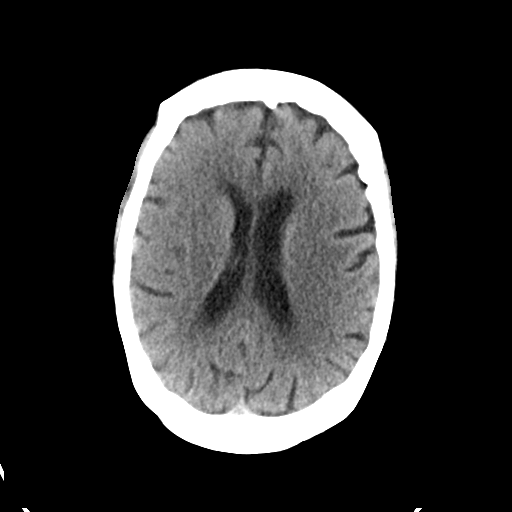
[im 16/25  brain]
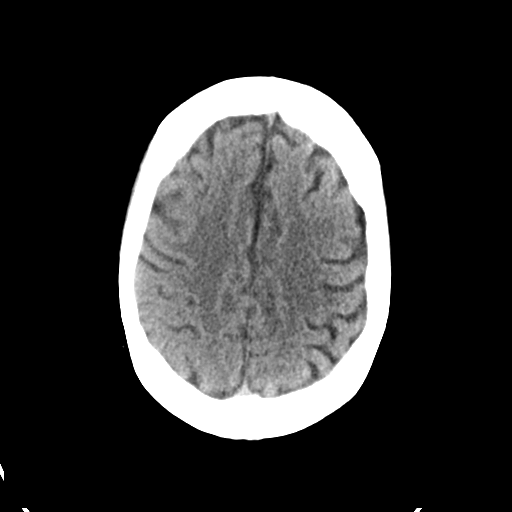
[im 16/25  bone]
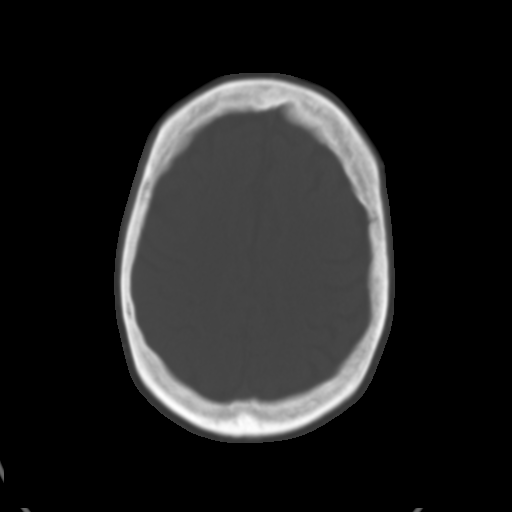
[im 19/25  brain]
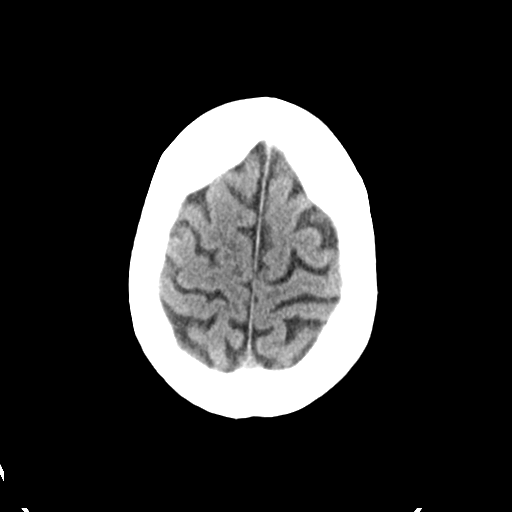
[im 22/25  brain]
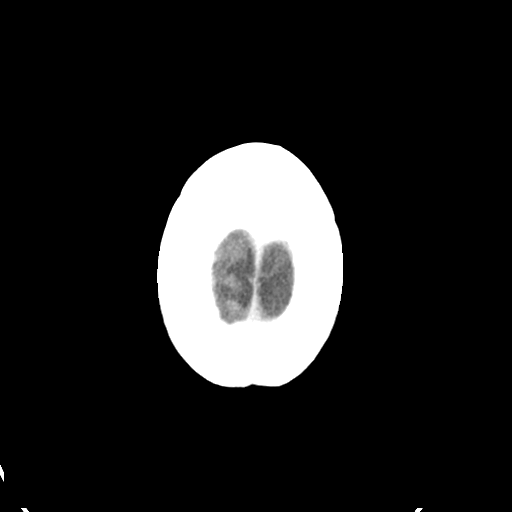

[Series 5: head without cor · coronal · non-contrast · 0.27mm/px · 3 of 68 slices shown]
[im 23/68  brain]
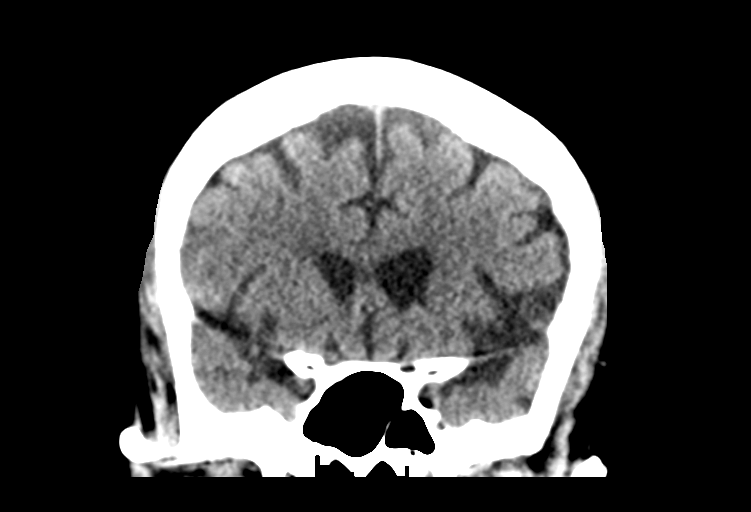
[im 30/68  brain]
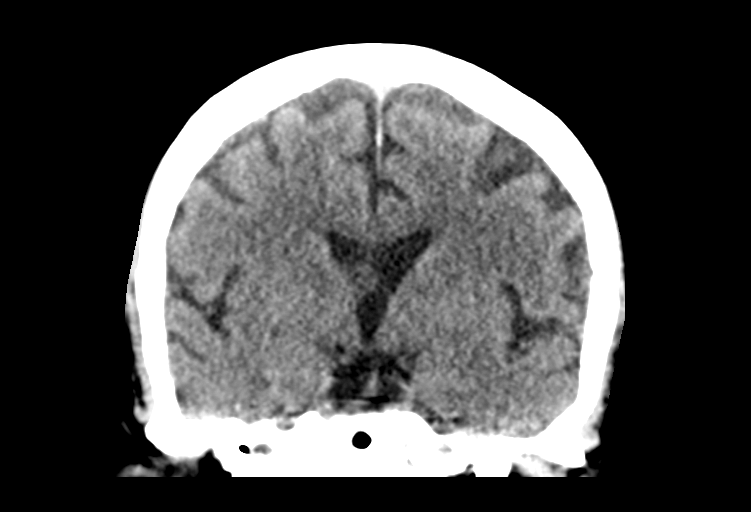
[im 38/68  brain]
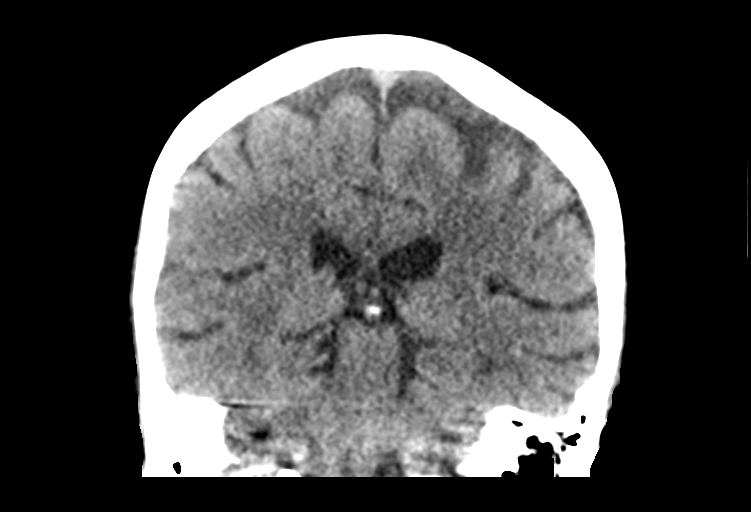

[Series 6: head without sag · sagittal · non-contrast · 0.23mm/px · 3 of 67 slices shown]
[im 23/67  brain]
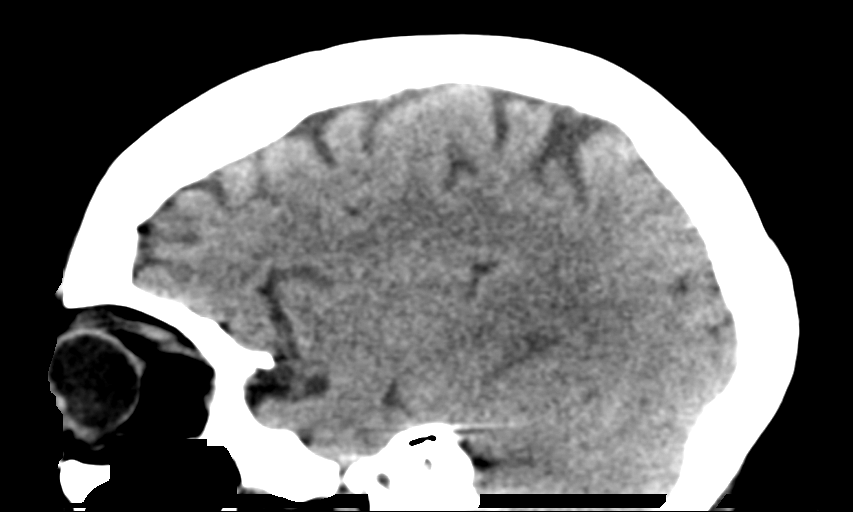
[im 34/67  brain]
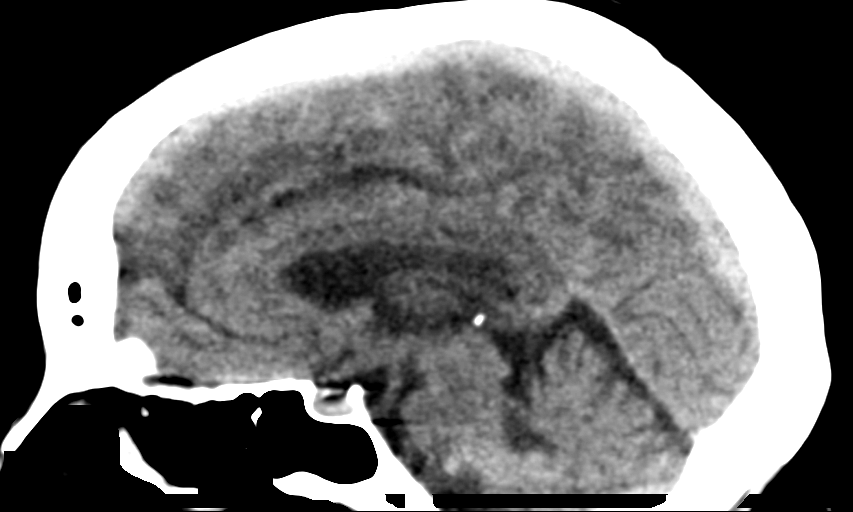
[im 45/67  brain]
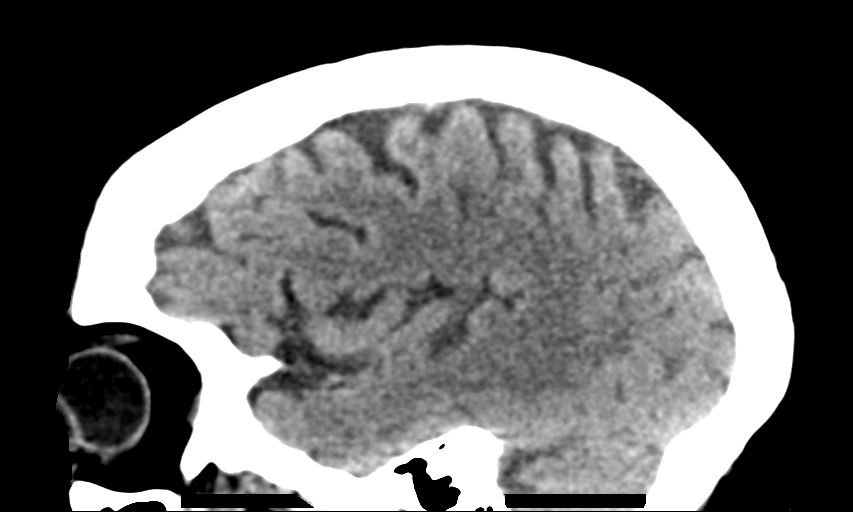

[13 of 47 positions shown; findings below may reference images not displayed]

FINDINGS: CT HEAD FINDINGS

Brain: No evidence of acute infarction, hemorrhage, hydrocephalus,
extra-axial collection or mass lesion/mass effect. Periventricular
and deep white matter hypodensity.

Vascular: No hyperdense vessel or unexpected calcification.

Skull: Normal. Negative for fracture or focal lesion.

Sinuses/Orbits: No acute finding.

Other: None.

CT CERVICAL SPINE FINDINGS

Examination of the cervical spine is significantly limited by motion
artifact.

Alignment: Normal.

Skull base and vertebrae: No acute fracture. No primary bone lesion
or focal pathologic process.

Soft tissues and spinal canal: No prevertebral fluid or swelling. No
visible canal hematoma.

Disc levels: Mild to moderate disc space height loss and
osteophytosis of the lower cervical levels, worst at C5 through C7.

Upper chest: Negative.

Other: None.
IMPRESSION: 1. No acute intracranial pathology. Small-vessel white matter
disease. Consider MRI to further evaluate for acute diffusion
restricting infarction if suspected based on clinical signs and
symptoms.
2. Examination of the cervical spine is significantly limited by
motion artifact. Within this limitation, no obvious fracture or
static subluxation of the cervical spine.
3. Mild to moderate disc space height loss and osteophytosis of the
lower cervical levels, worst at C5 through C7.

## 2019-08-01 IMAGING — CT CT CERVICAL SPINE W/O CM
3 of 4 series · 13 of 33 positions shown, 16 images · non-contrast
Comparison: None.

CLINICAL DATA: Woke up with right hand weakness

EXAM:
CT HEAD WITHOUT CONTRAST
CT CERVICAL SPINE WITHOUT CONTRAST
TECHNIQUE: Multidetector CT imaging of the head and cervical spine was
performed following the standard protocol without intravenous
contrast. Multiplanar CT image reconstructions of the cervical spine
were also generated.

[Series 4: c_spine 2.0 st · axial · 0.33mm/px · z∈[-262,-120]mm · 5 of 107 slices shown, 7 images]
[im 18/107  soft-tissue]
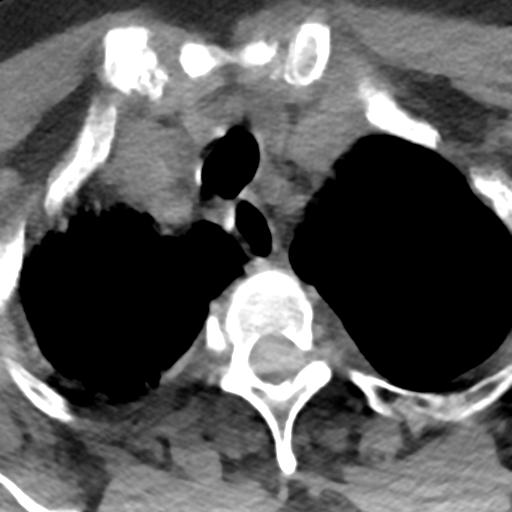
[im 18/107  bone]
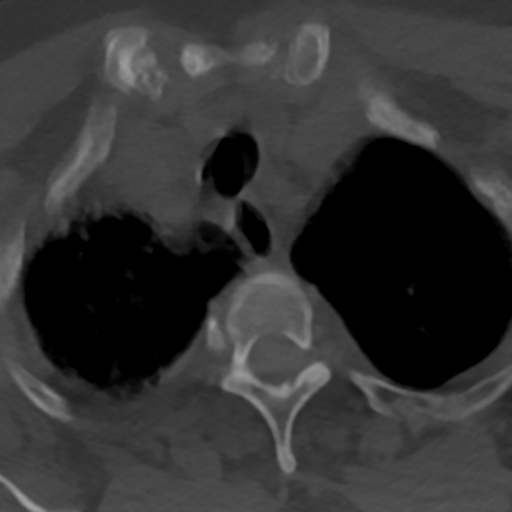
[im 36/107  bone]
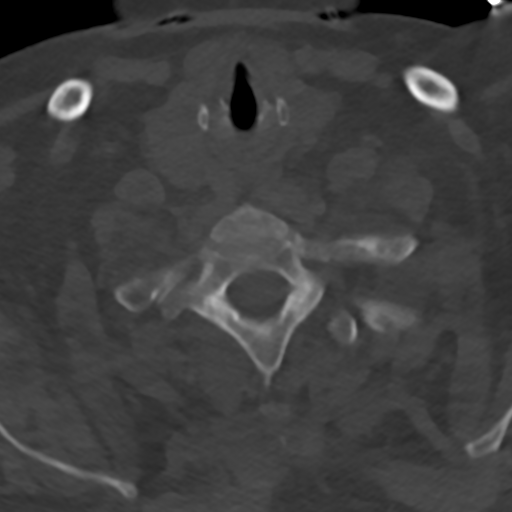
[im 54/107  bone]
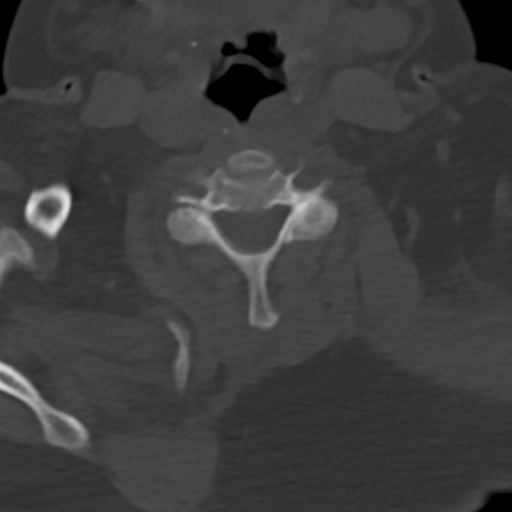
[im 71/107  bone]
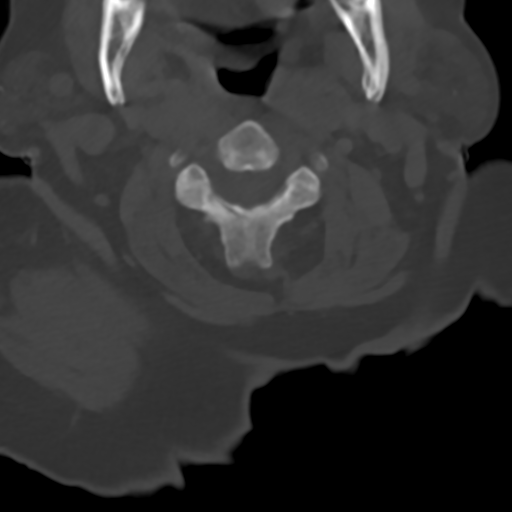
[im 89/107  soft-tissue]
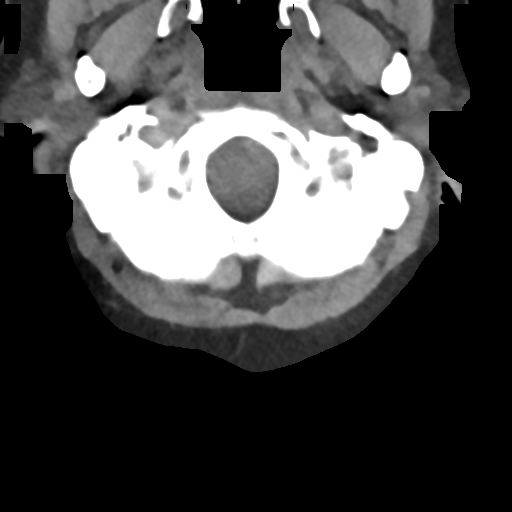
[im 89/107  bone]
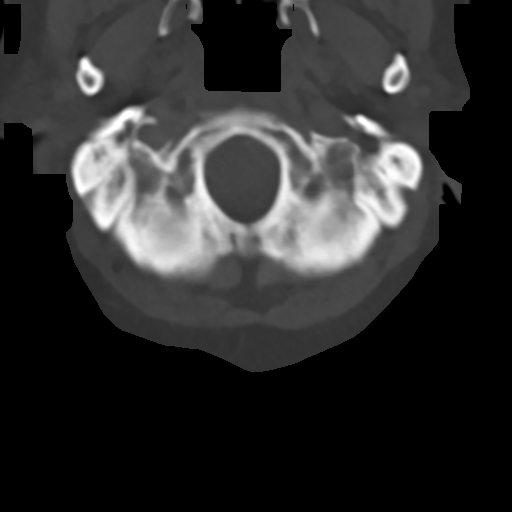

[Series 6: c_spine 2.0 sag bone · sagittal · 0.31mm/px · 5 of 61 slices shown, 6 images]
[im 21/61  bone]
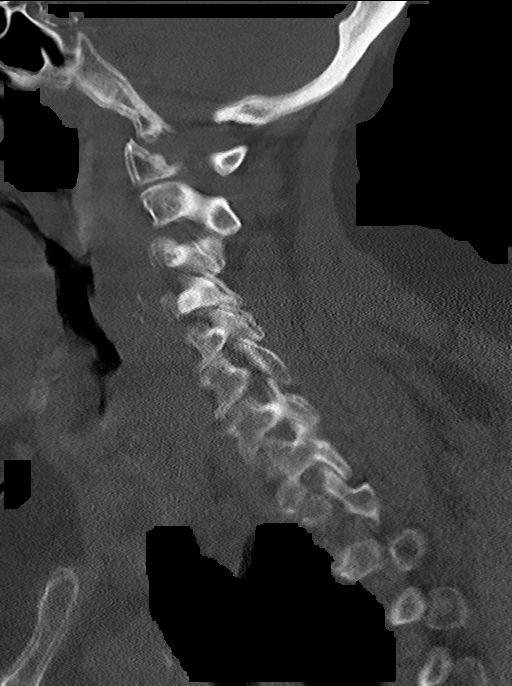
[im 26/61  bone]
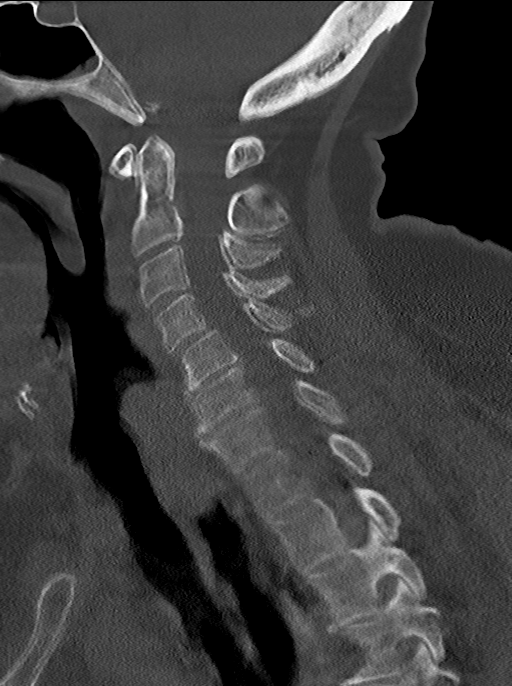
[im 31/61  soft-tissue]
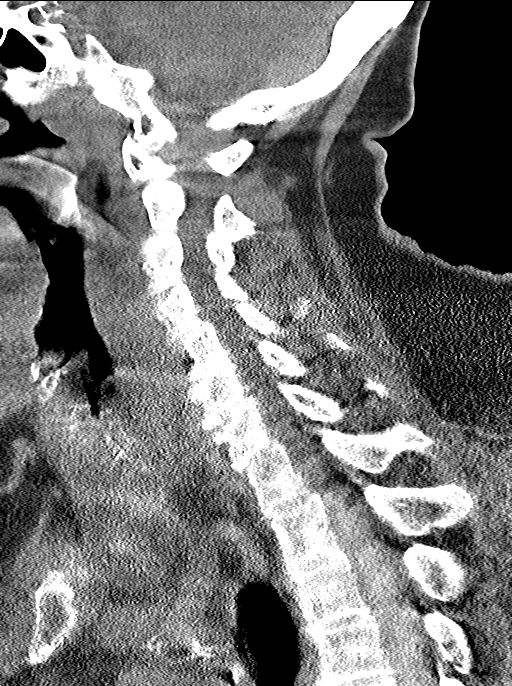
[im 31/61  bone]
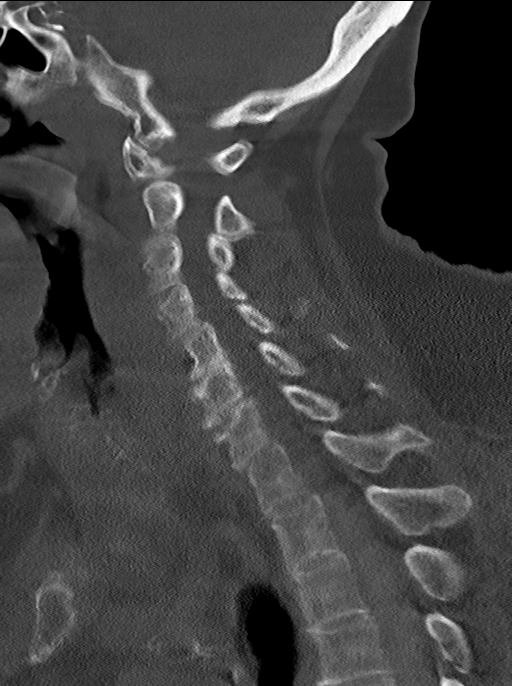
[im 36/61  bone]
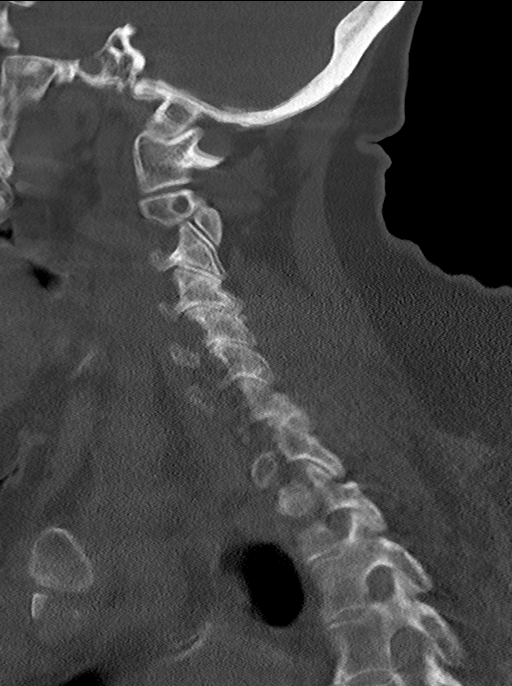
[im 41/61  bone]
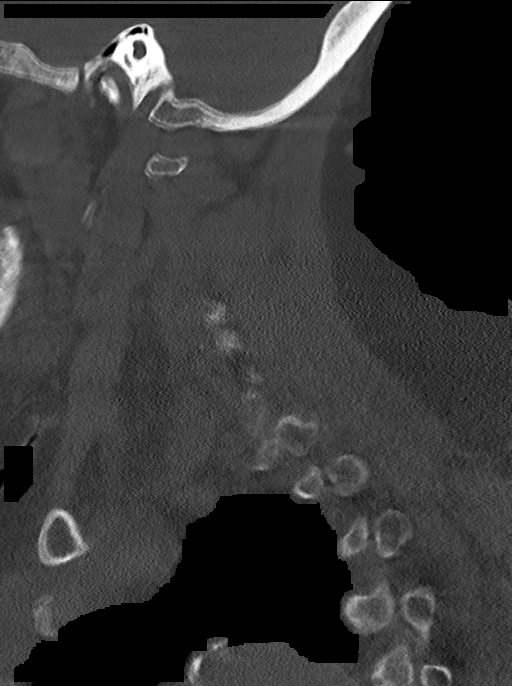

[Series 7: c_spine 2.0 cor bone · coronal · 0.31mm/px · 3 of 61 slices shown]
[im 13/61  bone]
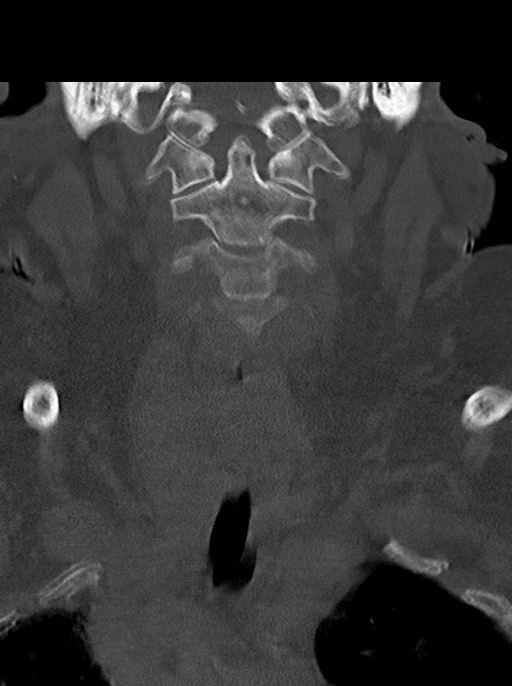
[im 25/61  bone]
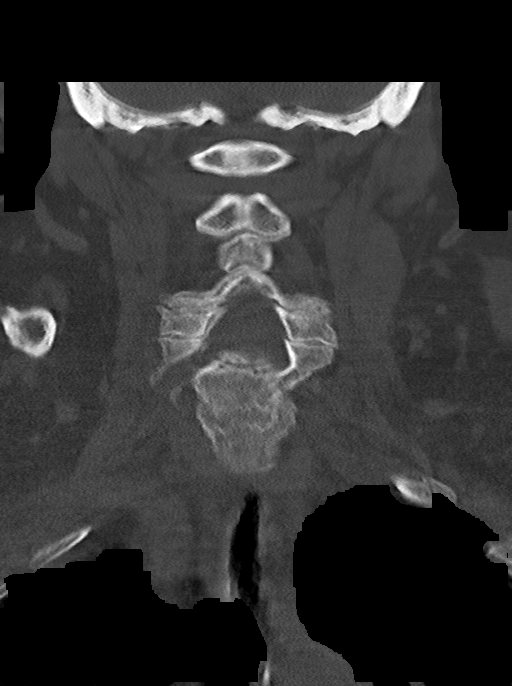
[im 37/61  bone]
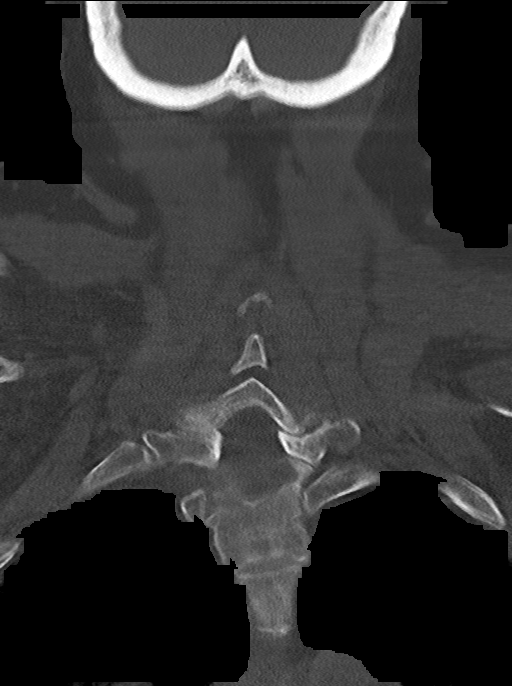

[13 of 33 positions shown; findings below may reference images not displayed]

FINDINGS: CT HEAD FINDINGS

Brain: No evidence of acute infarction, hemorrhage, hydrocephalus,
extra-axial collection or mass lesion/mass effect. Periventricular
and deep white matter hypodensity.

Vascular: No hyperdense vessel or unexpected calcification.

Skull: Normal. Negative for fracture or focal lesion.

Sinuses/Orbits: No acute finding.

Other: None.

CT CERVICAL SPINE FINDINGS

Examination of the cervical spine is significantly limited by motion
artifact.

Alignment: Normal.

Skull base and vertebrae: No acute fracture. No primary bone lesion
or focal pathologic process.

Soft tissues and spinal canal: No prevertebral fluid or swelling. No
visible canal hematoma.

Disc levels: Mild to moderate disc space height loss and
osteophytosis of the lower cervical levels, worst at C5 through C7.

Upper chest: Negative.

Other: None.
IMPRESSION: 1. No acute intracranial pathology. Small-vessel white matter
disease. Consider MRI to further evaluate for acute diffusion
restricting infarction if suspected based on clinical signs and
symptoms.
2. Examination of the cervical spine is significantly limited by
motion artifact. Within this limitation, no obvious fracture or
static subluxation of the cervical spine.
3. Mild to moderate disc space height loss and osteophytosis of the
lower cervical levels, worst at C5 through C7.

## 2019-08-01 IMAGING — MR MR MRA HEAD W/O CM
1 series · 19 of 48 positions shown · non-contrast
Comparison: Prior MRI from earlier the same day.

CLINICAL DATA: Follow-up examination for acute stroke.

EXAM:
MRA HEAD WITHOUT CONTRAST
TECHNIQUE: Angiographic images of the Circle of Willis were obtained using MRA
technique without intravenous contrast.

[Series 5: 3d cow · axial · 0.5mm · 0.41mm/px · z∈[-111,-30]mm · 19 of 172 slices shown]
[im 1/172]
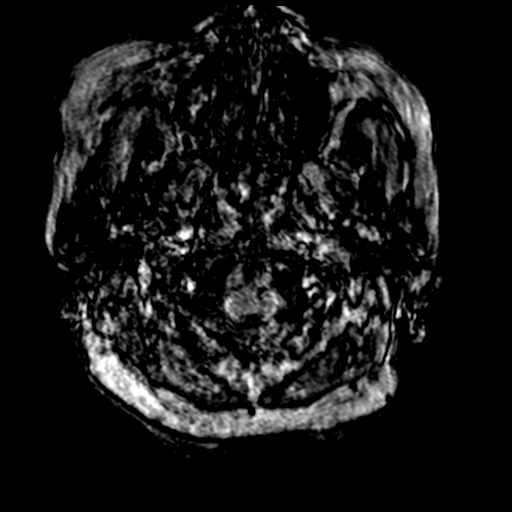
[im 4/172]
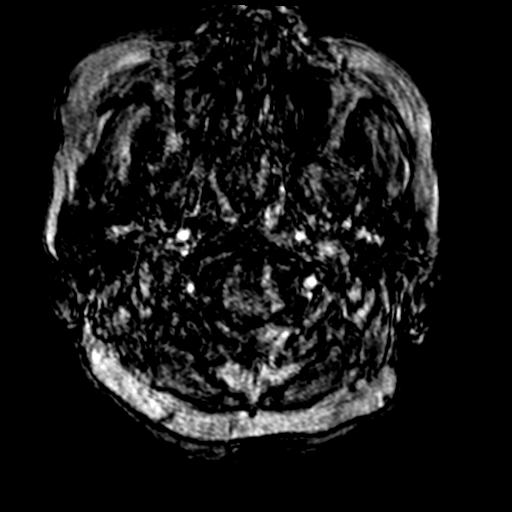
[im 8/172]
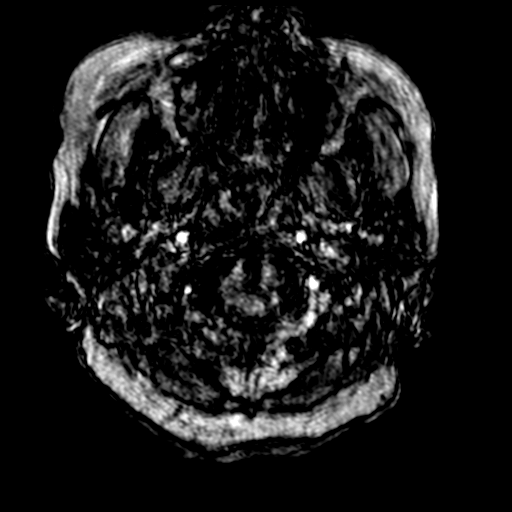
[im 11/172]
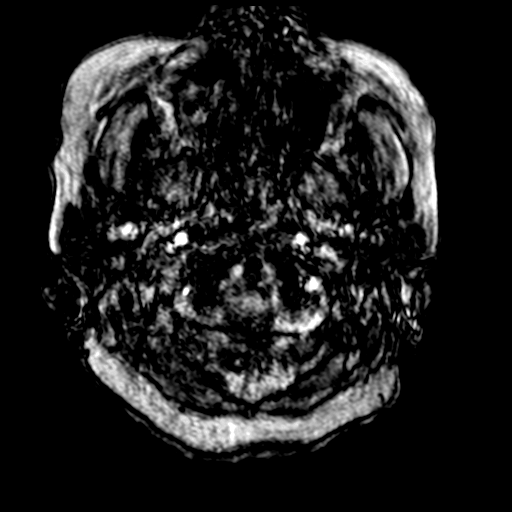
[im 15/172]
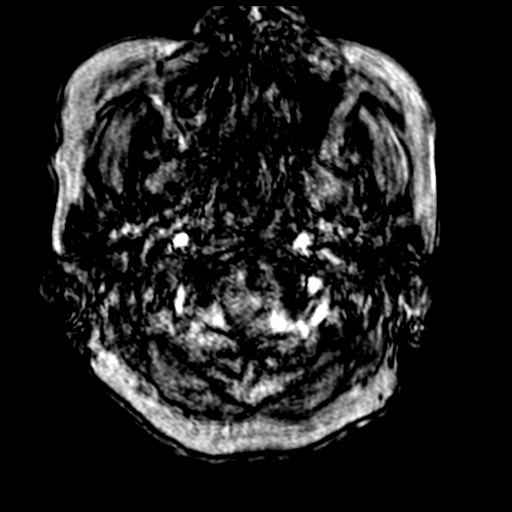
[im 19/172]
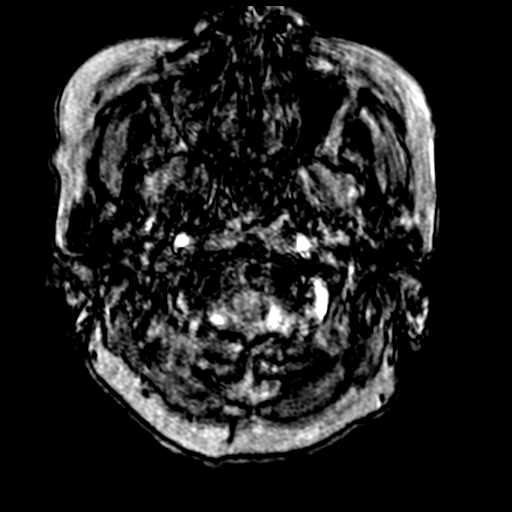
[im 22/172]
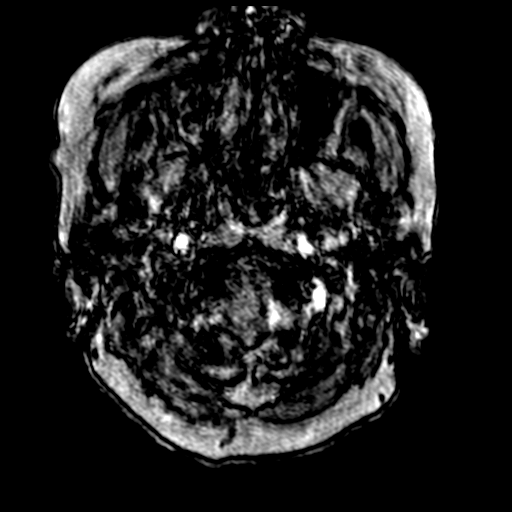
[im 26/172]
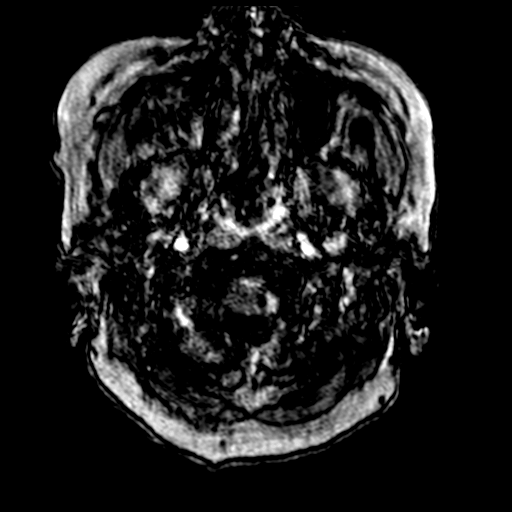
[im 30/172]
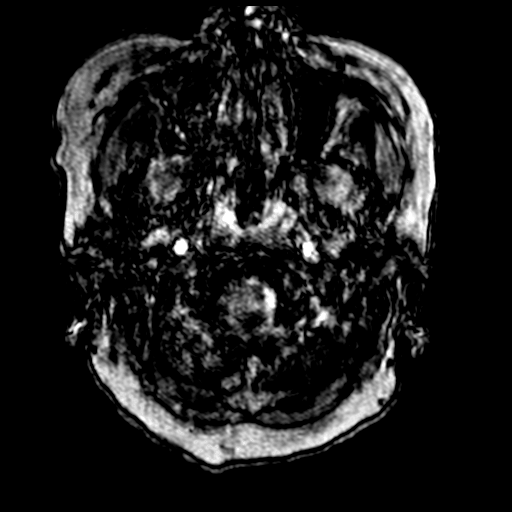
[im 33/172]
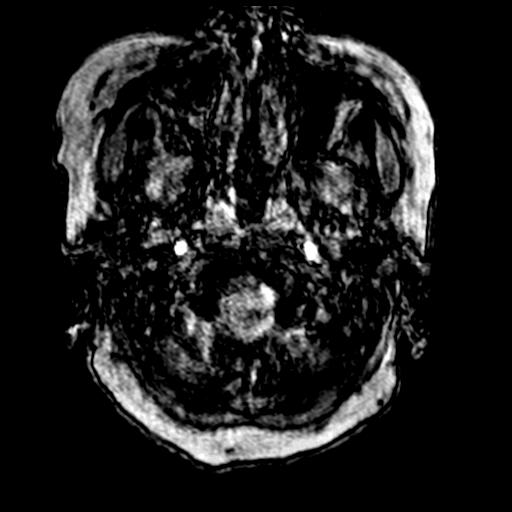
[im 37/172]
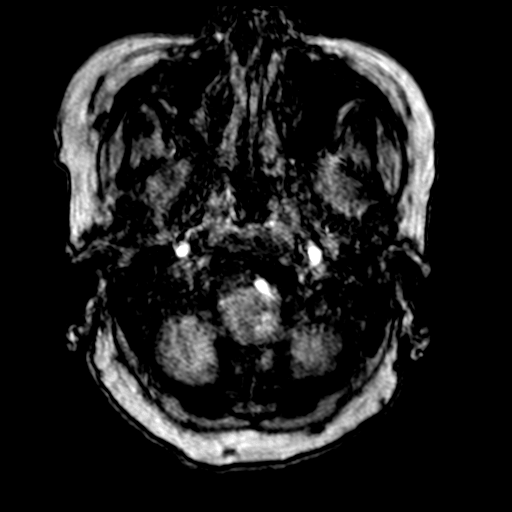
[im 55/172]
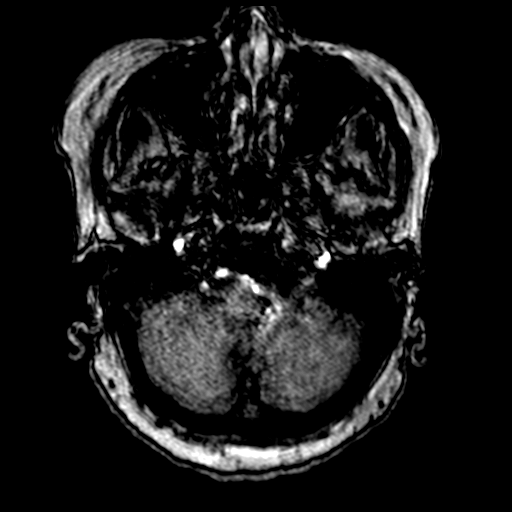
[im 77/172]
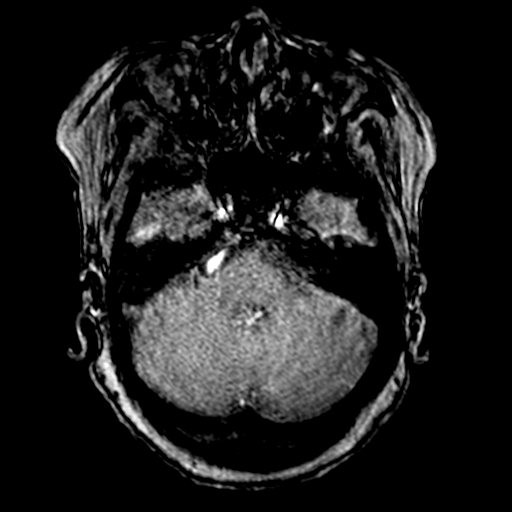
[im 88/172]
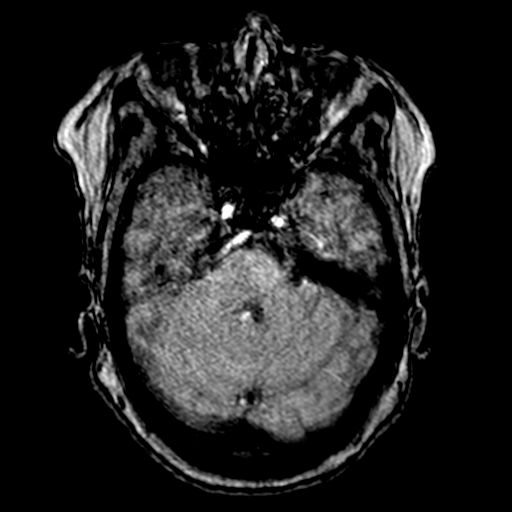
[im 99/172]
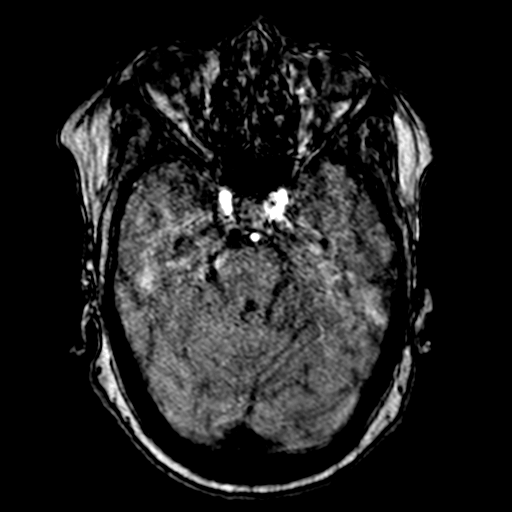
[im 121/172]
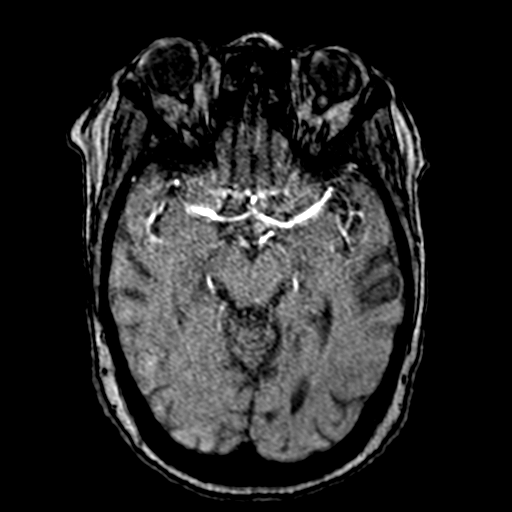
[im 142/172]
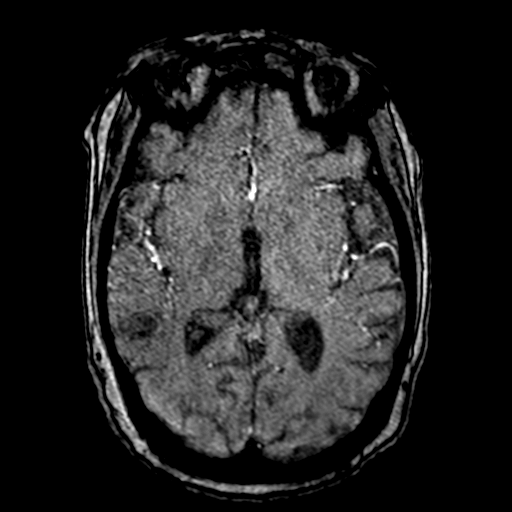
[im 146/172]
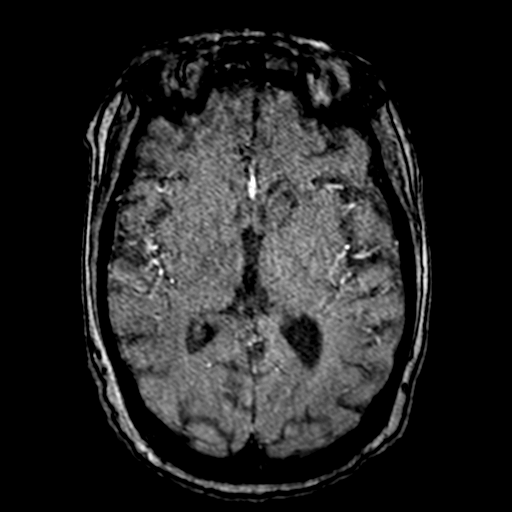
[im 164/172]
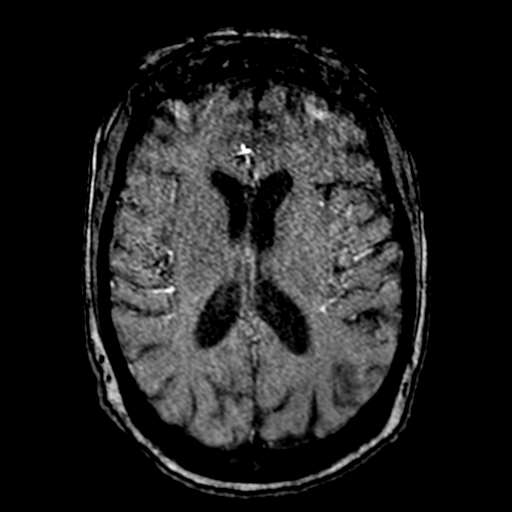

[19 of 48 positions shown; findings below may reference images not displayed]

FINDINGS: ANTERIOR CIRCULATION:

Examination moderately to severely degraded by motion artifact.

Visualized distal cervical segments of the internal carotid arteries
are patent with antegrade flow. Petrous, cavernous, and supraclinoid
ICAs patent without obvious flow-limiting stenosis or other
abnormality. A1 segments patent bilaterally. Grossly normal anterior
communicating artery complex. Anterior cerebral arteries patent to
their distal aspects without stenosis. No M1 stenosis or occlusion.
Grossly normal MCA bifurcations. Distal MCA branches well perfused
and grossly symmetric.

POSTERIOR CIRCULATION:

Dominant left vertebral artery grossly patent to the vertebrobasilar
junction without appreciable stenosis. Left PICA patent proximally.
Right vertebral artery diffusely hypoplastic, and is not well seen
as it courses into the skull base, and may be partially occluded.
Flow related signal is seen within the right V4 segment distally,
which could be partially collateral in nature. Right PICA not well
seen. Basilar grossly patent to its distal aspect without stenosis.
Superior cerebral arteries patent proximally. Both PCAs primarily
supplied via the basilar, although small bilateral posterior
communicating arteries noted. PCAs grossly perfused to their distal
aspects without stenosis.

No appreciable intracranial aneurysm.
IMPRESSION: 1. Technically limited exam due to extensive motion artifact.
2. Hypoplastic right vertebral artery, nonvisualized as it courses
into the cranial vault, and could be occluded. Flow related signal
seen within the right V4 segment distally, which could be collateral
nature. Right PICA not definitely seen. Dominant left vertebral
artery widely patent.
3. Otherwise gross patency of the major arterial intracranial
arterial circulation. No other large vessel occlusion. No definite
hemodynamically significant or correctable stenosis.

## 2019-08-01 IMAGING — MR MR HEAD W/O CM
4 of 5 series · 19 of 48 positions shown · non-contrast
Comparison: Noncontrast head CT performed earlier the same day
[DATE].

CLINICAL DATA: Right hand weakness.

EXAM:
MRI HEAD WITHOUT CONTRAST
TECHNIQUE: Multiplanar, multiecho pulse sequences of the brain and surrounding
structures were obtained without intravenous contrast.

[Series 2: DWI · axial · 3.0mm · 0.94mm/px · z∈[-44,+79]mm · 9 of 100 slices shown (1 of 2)]
[im 6/100]
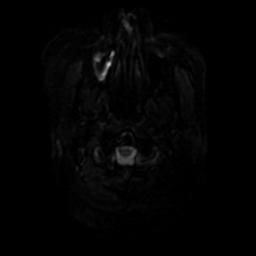
[im 18/100]
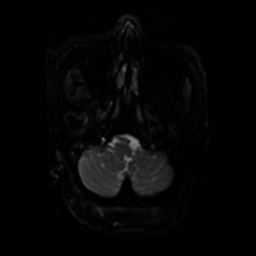
[im 30/100]
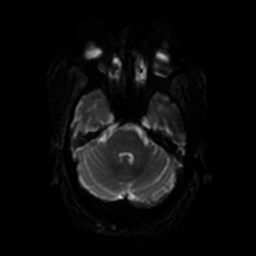
[im 41/100]
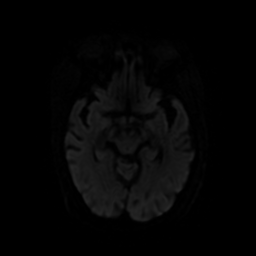
[im 53/100]
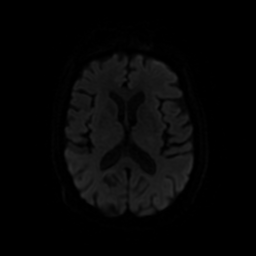
[im 59/100]
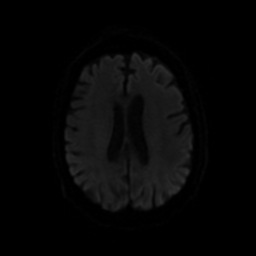
[im 70/100]
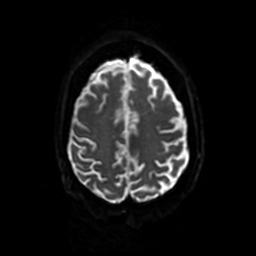
[im 82/100]
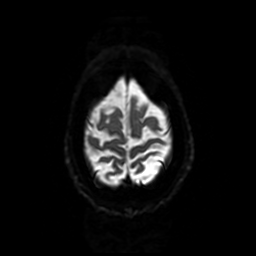
[im 88/100]
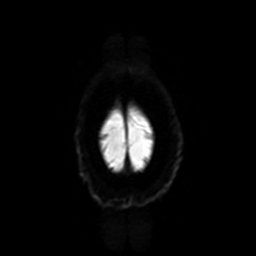

[Series 3: DWI · coronal · 4.0mm · 0.94mm/px · 3 of 74 slices shown (2 of 2)]
[im 14/74]
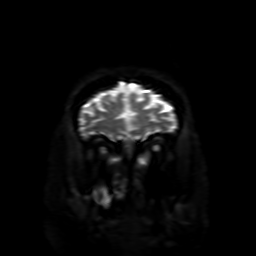
[im 40/74]
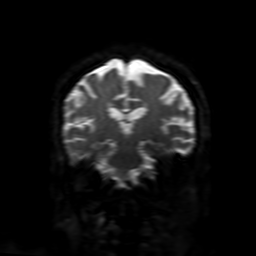
[im 67/74]
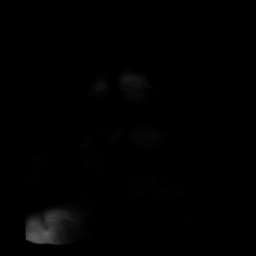

[Series 4: FLAIR · sagittal · 5.0mm · 0.23mm/px · 4 of 26 slices shown]
[im 1/26]
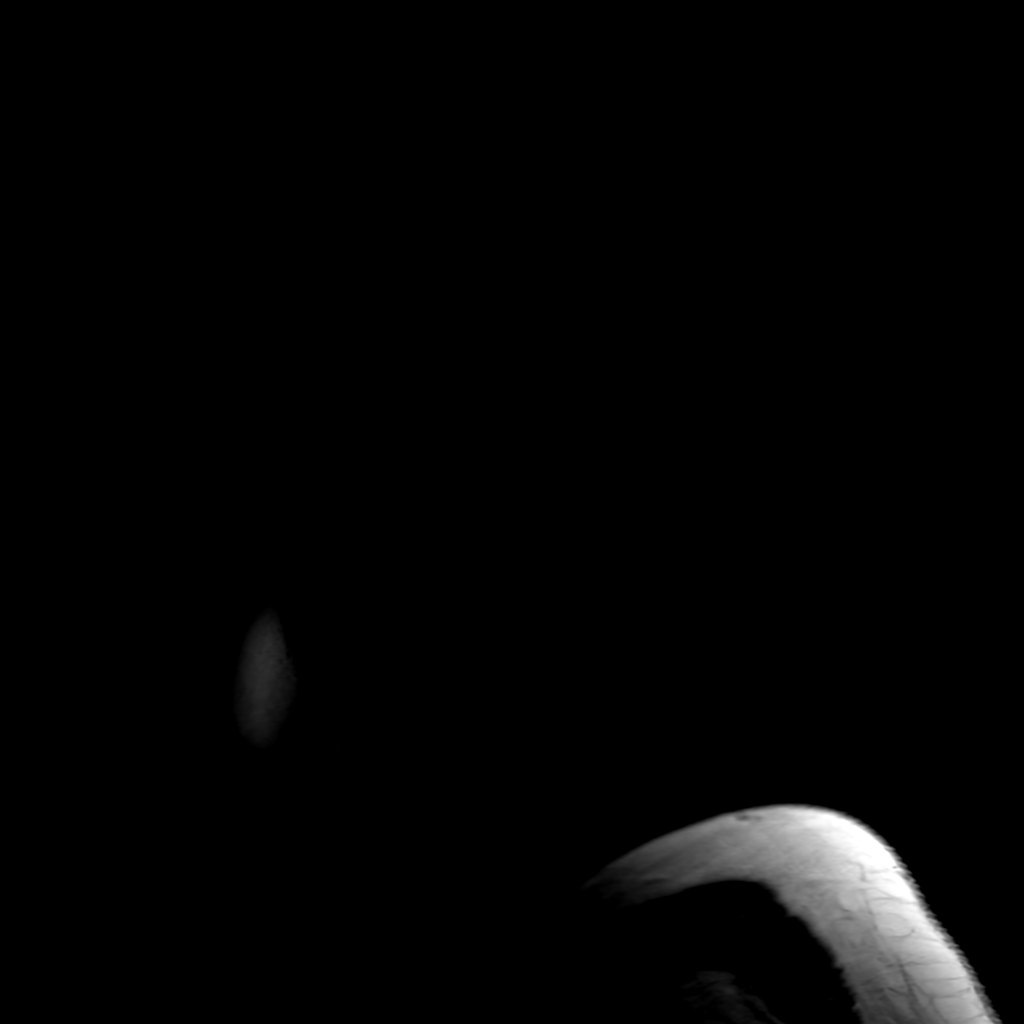
[im 9/26]
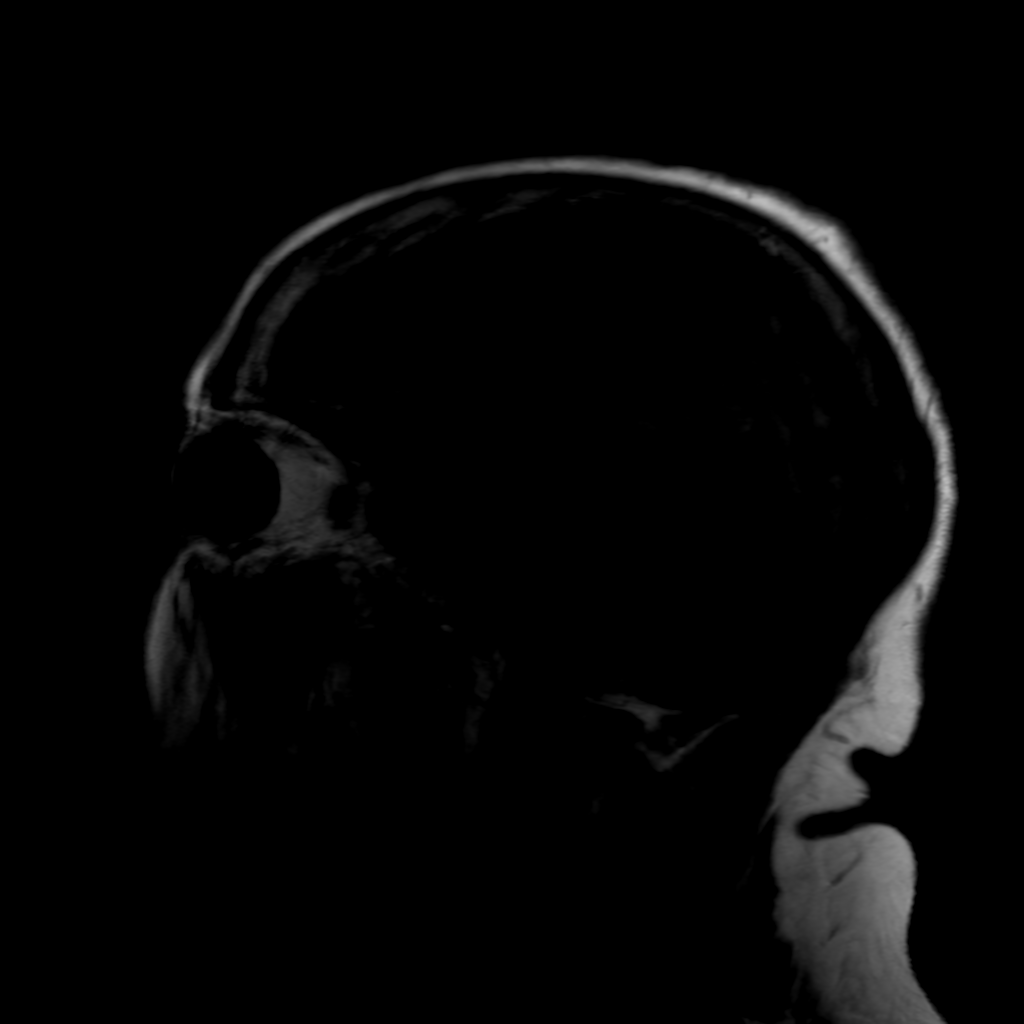
[im 17/26]
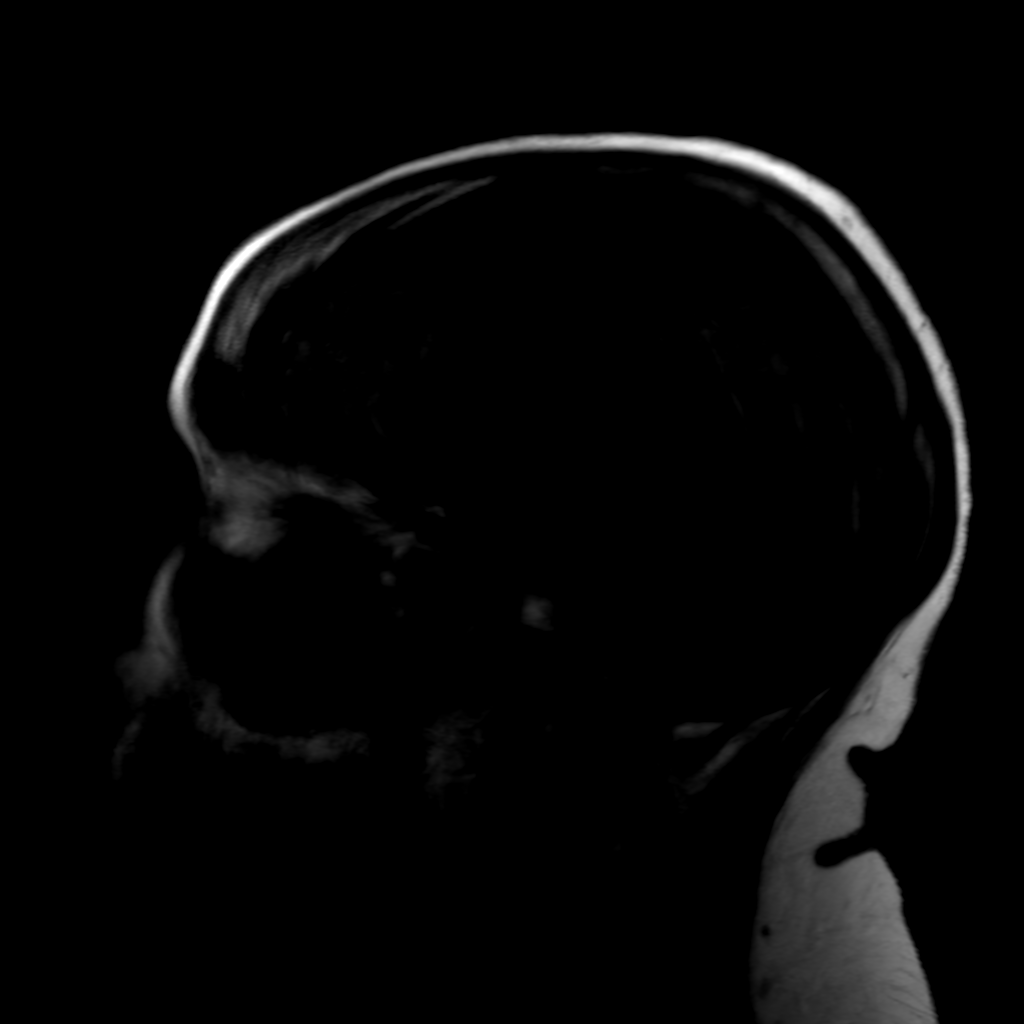
[im 26/26]
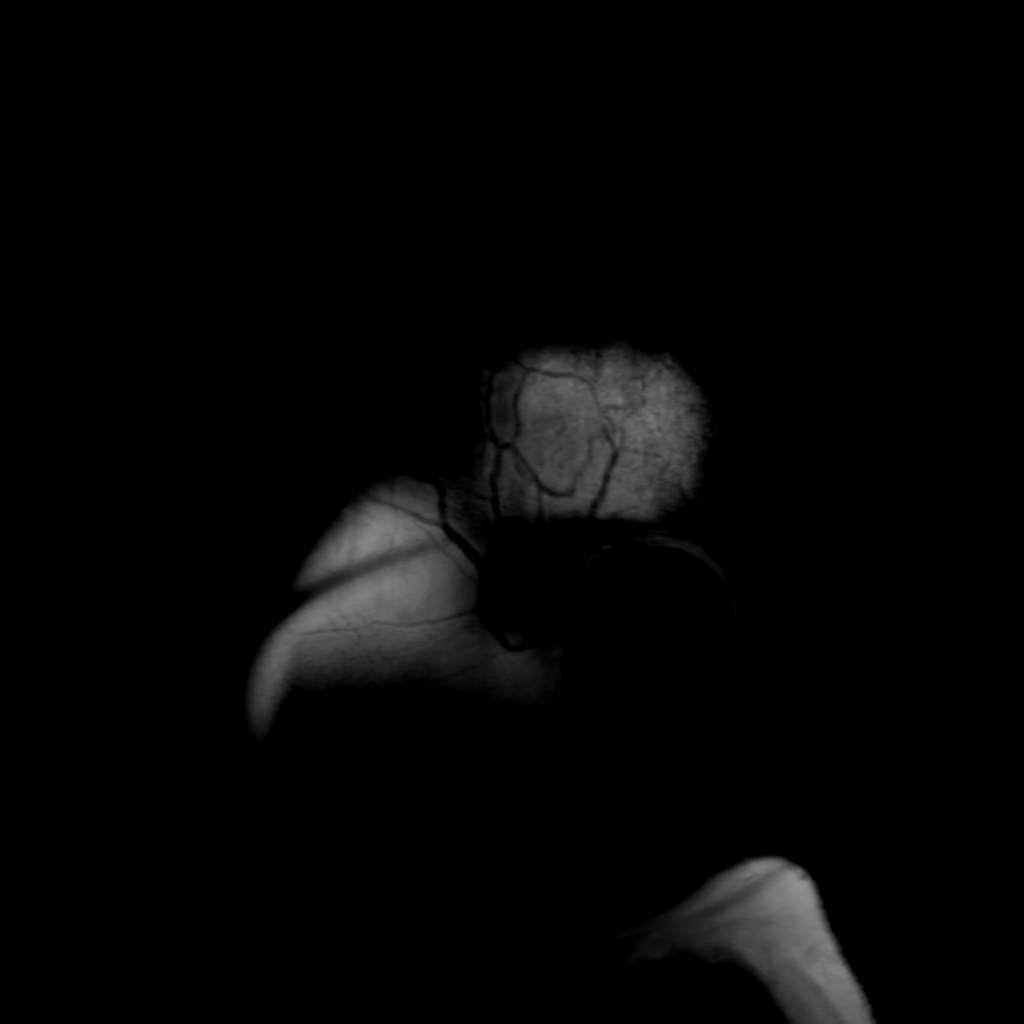

[Series 250: ADC · axial · 3.0mm · 0.94mm/px · z∈[-29,+76]mm · 3 of 50 slices shown]
[im 8/50]
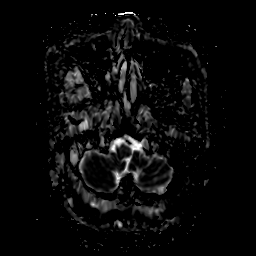
[im 29/50]
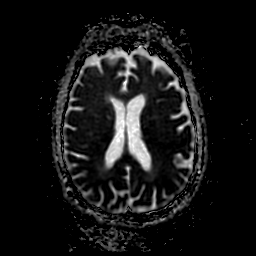
[im 43/50]
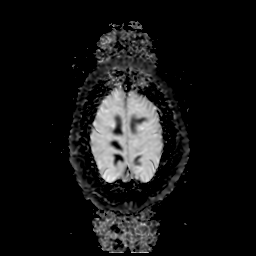

[19 of 48 positions shown; findings below may reference images not displayed]

FINDINGS: Brain:

The patient was unable to tolerate the full examination. As a
result, only axial and coronal diffusion-weighted sequences as well
as a sagittal T1 weighted sequence could be obtained. There is
moderate/severe motion degradation of the sagittal T1 weighted
sequence.

There is a small focus of cortical/subcortical restricted diffusion
affecting the left pre and postcentral gyri consistent with acute
infarction (for instance as seen on series 2, image 37). These
findings would account for the reported right hand weakness.

Additional small acute infarct within the right cerebellum (series
2, image 13)

Vascular: Poorly assessed on the acquired sequences.

Skull and upper cervical spine: Focal marrow lesion identified on
the acquired sequences.

Sinuses/Orbits: Poorly assessed on the acquired sequences.
IMPRESSION: Prematurely terminated, motion degraded and limited examination as
described.

Small acute cortical/subcortical infarct affecting the left pre- and
postcentral gyri. This finding would account for the patient's
reported right hand weakness.

Additional subcentimeter acute infarct within the right cerebellum.

## 2019-08-01 MED ORDER — PREDNISONE 5 MG PO TABS
5.0000 mg | ORAL_TABLET | Freq: Every day | ORAL | Status: DC
  Administered 2019-08-02 – 2019-08-06 (×5): 5 mg via ORAL
  Filled 2019-08-01 (×5): qty 1

## 2019-08-01 MED ORDER — STROKE: EARLY STAGES OF RECOVERY BOOK
Freq: Once | Status: AC
  Filled 2019-08-01: qty 1

## 2019-08-01 MED ORDER — LEFLUNOMIDE 20 MG PO TABS
10.0000 mg | ORAL_TABLET | Freq: Every day | ORAL | Status: DC
  Administered 2019-08-02 – 2019-08-06 (×5): 10 mg via ORAL
  Filled 2019-08-01 (×6): qty 0.5

## 2019-08-01 MED ORDER — HYDROCODONE-ACETAMINOPHEN 10-325 MG PO TABS
1.0000 | ORAL_TABLET | Freq: Three times a day (TID) | ORAL | Status: DC | PRN
  Administered 2019-08-02 – 2019-08-06 (×7): 1 via ORAL
  Filled 2019-08-01 (×7): qty 1

## 2019-08-01 MED ORDER — ACETAMINOPHEN 325 MG PO TABS
650.0000 mg | ORAL_TABLET | ORAL | Status: DC | PRN
  Administered 2019-08-02 – 2019-08-03 (×3): 650 mg via ORAL
  Filled 2019-08-01 (×3): qty 2

## 2019-08-01 MED ORDER — CLOPIDOGREL BISULFATE 75 MG PO TABS
75.0000 mg | ORAL_TABLET | Freq: Every day | ORAL | Status: DC
  Administered 2019-08-02 – 2019-08-05 (×4): 75 mg via ORAL
  Filled 2019-08-01 (×4): qty 1

## 2019-08-01 MED ORDER — LORAZEPAM 2 MG/ML IJ SOLN
1.0000 mg | Freq: Once | INTRAMUSCULAR | Status: DC
  Filled 2019-08-01: qty 1

## 2019-08-01 MED ORDER — LORAZEPAM 2 MG/ML IJ SOLN
1.0000 mg | Freq: Once | INTRAMUSCULAR | Status: AC
  Administered 2019-08-01: 1 mg via INTRAVENOUS
  Filled 2019-08-01: qty 1

## 2019-08-01 MED ORDER — ASPIRIN EC 81 MG PO TBEC
81.0000 mg | DELAYED_RELEASE_TABLET | Freq: Every day | ORAL | Status: DC
  Administered 2019-08-02 – 2019-08-05 (×4): 81 mg via ORAL
  Filled 2019-08-01 (×4): qty 1

## 2019-08-01 MED ORDER — ACETAMINOPHEN 650 MG RE SUPP: 650.0000 mg | RECTAL | Status: DC | PRN

## 2019-08-01 MED ORDER — ENOXAPARIN SODIUM 40 MG/0.4ML ~~LOC~~ SOLN
40.0000 mg | SUBCUTANEOUS | Status: DC
  Administered 2019-08-01 – 2019-08-04 (×4): 40 mg via SUBCUTANEOUS
  Filled 2019-08-01 (×4): qty 0.4

## 2019-08-01 MED ORDER — LORAZEPAM 2 MG/ML IJ SOLN
1.5000 mg | Freq: Once | INTRAMUSCULAR | Status: AC
  Administered 2019-08-01: 1.5 mg via INTRAVENOUS
  Filled 2019-08-01: qty 1

## 2019-08-01 MED ORDER — CLOPIDOGREL BISULFATE 300 MG PO TABS
300.0000 mg | ORAL_TABLET | Freq: Once | ORAL | Status: AC
  Administered 2019-08-01: 300 mg via ORAL
  Filled 2019-08-01: qty 1

## 2019-08-01 MED ORDER — LEVOTHYROXINE SODIUM 50 MCG PO TABS
50.0000 ug | ORAL_TABLET | Freq: Every day | ORAL | Status: DC
  Administered 2019-08-02 – 2019-08-06 (×5): 50 ug via ORAL
  Filled 2019-08-01 (×5): qty 1

## 2019-08-01 MED ORDER — ACETAMINOPHEN 160 MG/5ML PO SOLN: 650.0000 mg | ORAL | Status: DC | PRN

## 2019-08-01 MED ORDER — HYDROXYCHLOROQUINE SULFATE 200 MG PO TABS
200.0000 mg | ORAL_TABLET | Freq: Two times a day (BID) | ORAL | Status: DC
  Administered 2019-08-02 – 2019-08-06 (×10): 200 mg via ORAL
  Filled 2019-08-01 (×10): qty 1

## 2019-08-01 NOTE — ED Provider Notes (Signed)
[facility] Provider Note   CSN: [id number] Arrival date & time: 08/01/19  1335     History Chief Complaint  Patient presents with  . Extremity Weakness    [name] is a 74 y.o. female with a past medical history rheumatoid arthritis, recently taken off of methotrexate on admission last month, anemia, hypertension, hyperlipidemia presenting to the ED with a chief complaint of right hand weakness.  Reports having intermittent right hand pain and swelling for the past several weeks.  However she woke up at 8 AM this morning and has been unable to grip anything with her right hand.  She went to bed last night being able to use her hand.  She denies any neck pain, prior stroke, injuries or falls, headache, vision changes, weakness or numbness of lower extremities, fever.  HPI     Past Medical History:  Diagnosis Date  . Anemia    takes Ferrous Sulfate daily  . Arthritis   . Chronic bronchitis ([place])    Combivent if needed)  . Colostomy care [name]) 2004  . Coronary artery disease   . Diverticular disease   . Dizziness   . Dry eyes    uses Restasis daily  . H/O hiatal hernia   . History of blood transfusion    no abnormal reaction noted  . History of colon polyps    benign  . Hyperlipidemia    takes Zocor daily  . Hypertension    takes Metoprolol daily  . Hypothyroidism    takes Synthroid daily  . Nocturia    states only d/t being on Lasix  . Numbness and tingling    left foot  . Peripheral edema    takes Lasix daily  . Rheumatoid arthritis(714.0)    Methotrexate 2 times a week  . Shortness of breath    with exertion    Patient Active Problem List   Diagnosis Date Noted  . Pain due to onychomycosis of toenails of both feet 08/31/2018  . Oxygen dependent   . Fever   . Decreased range of motion of left shoulder   . Drug-induced neutropenia ([place])   . Other pancytopenia ([place])   . Methotrexate toxicity   . 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 Acute diverticulitis   . Ventral hernia without obstruction or gangrene   . Cellulitis of leg, left   . Morbid obesity ([place])   . Diverticulitis 06/18/2017  . 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 S/P revision of total knee 10/24/2015    Past Surgical History:  Procedure Laterality Date  . ABDOMINAL HYSTERECTOMY  1965  . CAPSULOTOMY Bilateral 06/01/2012   Procedure: MINOR CAPSULOTOMY;  Surgeon: Myrtha Mantis., MD;  Location: [place];  Service: Ophthalmology;  Laterality: Bilateral;  . CATARACT EXTRACTION W/PHACO  01/01/2011   Procedure: CATARACT EXTRACTION PHACO AND INTRAOCULAR LENS PLACEMENT (IOC);  Surgeon: [name], MD;  Location: [place];  Service: Ophthalmology;  Laterality: Left;  . CATARACT EXTRACTION W/PHACO  12/24/2011   Procedure: CATARACT EXTRACTION PHACO AND INTRAOCULAR LENS PLACEMENT (IOC);  Surgeon: [name], MD;  Location: [place];  Service: Ophthalmology;  Laterality: Right;  . COLON SURGERY  2002   d/t diverticulosis  . COLONOSCOPY    . COLOSTOMY    . ESOPHAGOGASTRODUODENOSCOPY    . EYE SURGERY    . KNEE ARTHROSCOPY     bilateral  . TONSILLECTOMY    . TOTAL KNEE ARTHROPLASTY  2005/2008  bilateral  . TOTAL KNEE REVISION Right 10/24/2015   Procedure: TOTAL KNEE REVISION;  Surgeon: [name], MD;  Location: [place];  Service: Orthopedics;  Laterality: Right;  . YAG LASER APPLICATION Bilateral 4/0/9811   Procedure: YAG LASER APPLICATION;  Surgeon: Myrtha Mantis., MD;  Location: [place];  Service: Ophthalmology;  Laterality: Bilateral;     OB History   No obstetric history on file.     Family History  Problem Relation Age of Onset  . Anesthesia problems Neg Hx   . Hypotension Neg Hx   . Malignant hyperthermia Neg Hx   . Pseudochol deficiency Neg Hx     Social History   Tobacco Use  . Smoking status: Former Smoker    Quit date: 10/22/2002    Years since quitting: 16.7  . Smokeless tobacco: Never Used  .  Tobacco comment: quit smoking in 2004  Substance Use Topics  . Alcohol use: No    Comment: quit drinking in 2004  . Drug use: No    Home Medications Prior to Admission medications   Medication Sig Start Date End Date Taking? Authorizing Provider  cycloSPORINE (RESTASIS) 0.05 % ophthalmic emulsion Place 1 drop into both eyes 3 (three) times daily as needed (dry eyes).    Yes [provider]  etanercept (ENBREL) 50 MG/ML injection Inject 50 mg into the skin every Friday.   Yes [provider]  folic acid (FOLVITE) 1 MG tablet Take 1 mg by mouth daily.     Yes [provider]  HYDROcodone-acetaminophen (NORCO) 10-325 MG tablet Take 1 tablet by mouth 3 (three) times daily as needed for severe pain.    Yes [provider]  hydroxychloroquine (PLAQUENIL) 200 MG tablet Take 200 mg by mouth 2 (two) times daily. 07/25/19  Yes [provider]  leflunomide (ARAVA) 10 MG tablet Take 10 mg by mouth daily. 07/25/19  Yes [provider]  levothyroxine (SYNTHROID, LEVOTHROID) 50 MCG tablet Take 50 mcg by mouth daily.     Yes [provider]  metoprolol tartrate (LOPRESSOR) 50 MG tablet Take 50 mg by mouth 2 (two) times daily. 07/14/18  Yes [provider]  naproxen sodium (ALEVE) 220 MG tablet Take 220 mg by mouth daily as needed.   Yes [provider]  predniSONE (DELTASONE) 5 MG tablet Take 5 mg by mouth daily. 07/25/19  Yes [provider]  simvastatin (ZOCOR) 40 MG tablet Take 40 mg by mouth at bedtime.    Yes [provider]    Allergies    Patient has no known allergies.  Review of Systems   Review of Systems  Constitutional: Negative for appetite change, chills and fever.  HENT: Negative for ear pain, rhinorrhea, sneezing and sore throat.   Eyes: Negative for photophobia and visual disturbance.  Respiratory: Negative for cough, chest tightness, shortness of breath and wheezing.   Cardiovascular:  Negative for chest pain and palpitations.  Gastrointestinal: Negative for abdominal pain, blood in stool, constipation, diarrhea, nausea and vomiting.  Genitourinary: Negative for dysuria, hematuria and urgency.  Musculoskeletal: Negative for myalgias.  Skin: Negative for rash.  Neurological: Positive for weakness. Negative for dizziness and light-headedness.    Physical Exam Updated Vital Signs BP 133/66   Pulse 86   Temp 98.8 F (37.1 C) (Oral)   Resp (!) 25   SpO2 100%   Physical Exam Vitals and nursing note reviewed.  Constitutional:      General: She is not in  acute distress.    Appearance: She is well-developed. She is obese.  HENT:     Head: Normocephalic and atraumatic.     Nose: Nose normal.  Eyes:     General: No scleral icterus.       Right eye: No discharge.        Left eye: No discharge.     Conjunctiva/sclera: Conjunctivae normal.     Pupils: Pupils are equal, round, and reactive to light.  Cardiovascular:     Rate and Rhythm: Normal rate and regular rhythm.     Heart sounds: Normal heart sounds. No murmur heard.  No friction rub. No gallop.   Pulmonary:     Effort: Pulmonary effort is normal. No respiratory distress.     Breath sounds: Normal breath sounds.  Abdominal:     General: Bowel sounds are normal. There is no distension.     Palpations: Abdomen is soft.     Tenderness: There is no abdominal tenderness. There is no guarding.  Musculoskeletal:        General: Normal range of motion.     Cervical back: Normal range of motion and neck supple.     Right lower leg: Edema present.     Left lower leg: Edema present.     Comments: FROM of R wrist.  Skin:    General: Skin is warm and dry.     Findings: No rash.  Neurological:     General: No focal deficit present.     Mental Status: She is alert and oriented to person, place, and time.     Cranial Nerves: No cranial nerve deficit.     Sensory: No sensory deficit.     Motor: Weakness present. No  abnormal muscle tone.     Coordination: Coordination normal.     Comments: 0/5 strength of R hand. Able to move bilateral arms without difficulty. pupils reactive. No facial asymmetry noted. Cranial nerves appear grossly intact. Sensation intact to light touch on face, BUE and BLE. Strength 5/5 in BLE, LUE. 2+ radial pulse noted bilaterally.     ED Results / Procedures / Treatments   Labs (all labs ordered are listed, but only abnormal results are displayed) Labs Reviewed  CBC - Abnormal; Notable for the following components:      Result Value   Hemoglobin 11.4 (*)    MCH 24.9 (*)    MCHC 27.8 (*)    All other components within normal limits  COMPREHENSIVE METABOLIC PANEL - Abnormal; Notable for the following components:   CO2 20 (*)    Creatinine, Ser 1.54 (*)    Albumin 2.8 (*)    GFR calc non Af Amer 33 (*)    GFR calc Af Amer 39 (*)    All other components within normal limits  RAPID URINE DRUG SCREEN, HOSP PERFORMED - Abnormal; Notable for the following components:   Opiates POSITIVE (*)    All other components within normal limits  URINALYSIS, ROUTINE W REFLEX MICROSCOPIC - Abnormal; Notable for the following components:   Protein, ur 30 (*)    Bacteria, UA RARE (*)    All other components within normal limits  I-STAT CHEM 8, ED - Abnormal; Notable for the following components:   BUN 24 (*)    Creatinine, Ser 1.40 (*)    TCO2 20 (*)    All other components within normal limits  SARS CORONAVIRUS 2 BY RT PCR (HOSPITAL ORDER, [facility])  ETHANOL  PROTIME-INR  APTT  DIFFERENTIAL  CBG MONITORING, ED    EKG None  Radiology CT HEAD WO CONTRAST  Result Date: 08/01/2019 CLINICAL DATA:  Woke up with right hand weakness EXAM: CT HEAD WITHOUT CONTRAST CT CERVICAL SPINE WITHOUT CONTRAST TECHNIQUE: Multidetector CT imaging of the head and cervical spine was performed following the standard protocol without intravenous contrast. Multiplanar CT image  reconstructions of the cervical spine were also generated. COMPARISON:  None. FINDINGS: CT HEAD FINDINGS Brain: No evidence of acute infarction, hemorrhage, hydrocephalus, extra-axial collection or mass lesion/mass effect. Periventricular and deep white matter hypodensity. Vascular: No hyperdense vessel or unexpected calcification. Skull: Normal. Negative for fracture or focal lesion. Sinuses/Orbits: No acute finding. Other: None. CT CERVICAL SPINE FINDINGS Examination of the cervical spine is significantly limited by motion artifact. Alignment: Normal. Skull base and vertebrae: No acute fracture. No primary bone lesion or focal pathologic process. Soft tissues and spinal canal: No prevertebral fluid or swelling. No visible canal hematoma. Disc levels: Mild to moderate disc space height loss and osteophytosis of the lower cervical levels, worst at C5 through C7. Upper chest: Negative. Other: None. IMPRESSION: 1. No acute intracranial pathology. Small-vessel white matter disease. Consider MRI to further evaluate for acute diffusion restricting infarction if suspected based on clinical signs and symptoms. 2. Examination of the cervical spine is significantly limited by motion artifact. Within this limitation, no obvious fracture or static subluxation of the cervical spine. 3. Mild to moderate disc space height loss and osteophytosis of the lower cervical levels, worst at C5 through C7. Electronically Signed   By: Eddie Candle M.D.   On: 08/01/2019 15:30   CT Cervical Spine Wo Contrast  Result Date: 08/01/2019 CLINICAL DATA:  Woke up with right hand weakness EXAM: CT HEAD WITHOUT CONTRAST CT CERVICAL SPINE WITHOUT CONTRAST TECHNIQUE: Multidetector CT imaging of the head and cervical spine was performed following the standard protocol without intravenous contrast. Multiplanar CT image reconstructions of the cervical spine were also generated. COMPARISON:  None. FINDINGS: CT HEAD FINDINGS Brain: No evidence of acute  infarction, hemorrhage, hydrocephalus, extra-axial collection or mass lesion/mass effect. Periventricular and deep white matter hypodensity. Vascular: No hyperdense vessel or unexpected calcification. Skull: Normal. Negative for fracture or focal lesion. Sinuses/Orbits: No acute finding. Other: None. CT CERVICAL SPINE FINDINGS Examination of the cervical spine is significantly limited by motion artifact. Alignment: Normal. Skull base and vertebrae: No acute fracture. No primary bone lesion or focal pathologic process. Soft tissues and spinal canal: No prevertebral fluid or swelling. No visible canal hematoma. Disc levels: Mild to moderate disc space height loss and osteophytosis of the lower cervical levels, worst at C5 through C7. Upper chest: Negative. Other: None. IMPRESSION: 1. No acute intracranial pathology. Small-vessel white matter disease. Consider MRI to further evaluate for acute diffusion restricting infarction if suspected based on clinical signs and symptoms. 2. Examination of the cervical spine is significantly limited by motion artifact. Within this limitation, no obvious fracture or static subluxation of the cervical spine. 3. Mild to moderate disc space height loss and osteophytosis of the lower cervical levels, worst at C5 through C7. Electronically Signed   By: Eddie Candle M.D.   On: 08/01/2019 15:30   MR BRAIN WO CONTRAST  Result Date: 08/01/2019 CLINICAL DATA:  Right hand weakness. EXAM: MRI HEAD WITHOUT CONTRAST TECHNIQUE: Multiplanar, multiecho pulse sequences of the brain and surrounding structures were obtained without intravenous contrast. COMPARISON:  Noncontrast head CT performed earlier the same day 08/01/2019. FINDINGS:  Brain: The patient was unable to tolerate the full examination. As a result, only axial and coronal diffusion-weighted sequences as well as a sagittal T1 weighted sequence could be obtained. There is moderate/severe motion degradation of the sagittal T1 weighted  sequence. There is a small focus of cortical/subcortical restricted diffusion affecting the left pre and postcentral gyri consistent with acute infarction (for instance as seen on series 2, image 37). These findings would account for the reported right hand weakness. Additional small acute infarct within the right cerebellum (series 2, image 13) Vascular: Poorly assessed on the acquired sequences. Skull and upper cervical spine: Focal marrow lesion identified on the acquired sequences. Sinuses/Orbits: Poorly assessed on the acquired sequences. IMPRESSION: Prematurely terminated, motion degraded and limited examination as described. Small acute cortical/subcortical infarct affecting the left pre- and postcentral gyri. This finding would account for the patient's reported right hand weakness. Additional subcentimeter acute infarct within the right cerebellum. Electronically Signed   By: Kellie Simmering DO   On: 08/01/2019 17:10    Procedures .Critical Care Performed by: Delia Heady, PA-C Authorized by: Delia Heady, PA-C   Critical care provider statement:    Critical care time (minutes):  35   Critical care was necessary to treat or prevent imminent or life-threatening deterioration of the following conditions:  CNS failure or compromise and circulatory failure   Critical care was time spent personally by me on the following activities:  Development of treatment plan with patient or surrogate, discussions with consultants, examination of patient, ordering and performing treatments and interventions, ordering and review of laboratory studies, ordering and review of radiographic studies, re-evaluation of patient's condition and review of old charts   I assumed direction of critical care for this patient from another provider in my specialty: no     (including critical care time)  Medications Ordered in ED Medications  LORazepam (ATIVAN) injection 1 mg (1 mg Intravenous Given 08/01/19 1630)    ED Course   I have reviewed the triage vital signs and the nursing notes.  Pertinent labs & imaging results that were available during my care of the patient were reviewed by me and considered in my medical decision making (see chart for details).    MDM Rules/Calculators/A&P                          74 year old female with a past medical history of rheumatoid arthritis, or recently taken off of methotrexate on admission last month, anemia, hypertension, hyperlipidemia presenting to the ED with a chief complaint of right hand weakness.  Reports intermittent right hand pain and swelling for the past several weeks.  However she has been able to use her hand up until this morning when she woke up at 8 AM unable to grip anything with her right hand due to weakness.  Patient denied any neck pain to me but does endorse some discomfort in her neck to Dr. Tyrone Nine when he evaluated her at the bedside.  No history of injuries or falls, prior stroke, neck surgeries.  On exam patient has normal sensation to the right hand but unable to grip anything due to 0 out of 5 strength.  Normal distal pulses noted bilaterally of upper extremities.  Neurologically intact of lower extremities.  No facial asymmetry noted.  Lab work here is similar to baseline.  CT of the head and cervical spine showing no acute findings.  I spoke to Dr. Leonel Ramsay of neurology who recommends MRI  of the brain and cervical spine to evaluate for other neurological cause of her symptoms.  She remains in no acute distress at this time.  5:26 PM MRI of the brain is showing a small acute cortical/subcortical infarct.  This would explain patient's current symptoms.  Unfortunately this imaging had to be terminated due to her discomfort.  I did premedicate her with Ativan.  She was unable to have the MRI of the cervical spine done.  I spoke to Dr. Leonel Ramsay again who states that we do not need to obtain MRI of the cervical spine.  She will need to be admitted for  this acute stroke.  I have ordered a CT angio of her head and neck and will admit to family medicine service.   Portions of this note were generated with [employer]. Dictation errors may occur despite best attempts at proofreading.  Final Clinical Impression(s) / ED Diagnoses Final diagnoses:  Cerebrovascular accident (CVA) due to other mechanism [facility])    Rx / [place] Orders ED Discharge Orders    None       Delia Heady, PA-C 08/01/19 [place], Dwight, DO 08/02/19 0745

## 2019-08-01 NOTE — ED Notes (Signed)
Pt o2 was 85% I ask nurse if she was ok for her to be on 2L. Nurse stated that's fine.

## 2019-08-01 NOTE — Consult Note (Addendum)
Neurology Consultation  Reason for Consult: Stroke Referring Physician: Deno Etienne  CC: Right arm weakness and decreased sensation along with hip esthesia  History is obtained from: Patient  HPI: [name] is a 74 y.o. female with stroke risk factors of hypertension, hyperlipidemia, CAD.  Patient states that her last known normal was approximately 4 AM this morning.  She dozed off when she woke up she noted that she was unable to use her right hand and had difficulty moving her right arm.  Since that period in time she has not been able to move her right arm and states that although it has decreased sensation she is also having pain throughout that arm and hand.  She denies any other neurological symptoms.  She admits that she does not smoke, does not drink and does not take aspirin on a daily basis.  She states she is a never had symptoms such as this prior.  Neurology was asked to see patient after MRI confirmed stroke.  LKW: 4 AM tpa given?: no, out of window Premorbid modified Rankin scale (mRS): 2 NIH stroke score: 4   Past Medical History:  Diagnosis Date  . Anemia    takes Ferrous Sulfate daily  . Arthritis   . Chronic bronchitis ([place])    Combivent if needed)  . Colostomy care [facility]) 2004  . Coronary artery disease   . Diverticular disease   . Dizziness   . Dry eyes    uses Restasis daily  . H/O hiatal hernia   . History of blood transfusion    no abnormal reaction noted  . History of colon polyps    benign  . Hyperlipidemia    takes Zocor daily  . Hypertension    takes Metoprolol daily  . Hypothyroidism    takes Synthroid daily  . Nocturia    states only d/t being on Lasix  . Numbness and tingling    left foot  . Peripheral edema    takes Lasix daily  . Rheumatoid arthritis(714.0)    Methotrexate 2 times a week  . Shortness of breath    with exertion     Family History  Problem Relation Age of Onset  . Anesthesia problems Neg Hx   . Hypotension Neg Hx    . Malignant hyperthermia Neg Hx   . Pseudochol deficiency Neg Hx    Social History:   reports that she quit smoking about 16 years ago. She has never used smokeless tobacco. She reports that she does not drink alcohol and does not use drugs.  Medications No current facility-administered medications for this encounter.  Current Outpatient Medications:  .  cycloSPORINE (RESTASIS) 0.05 % ophthalmic emulsion, Place 1 drop into both eyes 3 (three) times daily as needed (dry eyes). , Disp: , Rfl:  .  etanercept (ENBREL) 50 MG/ML injection, Inject 50 mg into the skin every Friday., Disp: , Rfl:  .  folic acid (FOLVITE) 1 MG tablet, Take 1 mg by mouth daily.  , Disp: , Rfl:  .  HYDROcodone-acetaminophen (NORCO) 10-325 MG tablet, Take 1 tablet by mouth 3 (three) times daily as needed for severe pain. , Disp: , Rfl:  .  hydroxychloroquine (PLAQUENIL) 200 MG tablet, Take 200 mg by mouth 2 (two) times daily., Disp: , Rfl:  .  leflunomide (ARAVA) 10 MG tablet, Take 10 mg by mouth daily., Disp: , Rfl:  .  levothyroxine (SYNTHROID, LEVOTHROID) 50 MCG tablet, Take 50 mcg by mouth daily.  , Disp: ,  Rfl:  .  metoprolol tartrate (LOPRESSOR) 50 MG tablet, Take 50 mg by mouth 2 (two) times daily., Disp: , Rfl:  .  naproxen sodium (ALEVE) 220 MG tablet, Take 220 mg by mouth daily as needed., Disp: , Rfl:  .  predniSONE (DELTASONE) 5 MG tablet, Take 5 mg by mouth daily., Disp: , Rfl:  .  simvastatin (ZOCOR) 40 MG tablet, Take 40 mg by mouth at bedtime. , Disp: , Rfl:   ROS:   General ROS: negative for - chills, fatigue, fever, night sweats, weight gain or weight loss Psychological ROS: negative for - behavioral disorder, hallucinations, memory difficulties, mood swings or suicidal ideation Ophthalmic ROS: negative for - blurry vision, double vision, eye pain or loss of vision ENT ROS: negative for - epistaxis, nasal discharge, oral lesions, sore throat, tinnitus or vertigo Respiratory ROS: Positive for -  cough, shortness of breath  Cardiovascular ROS: negative for - chest pain, dyspnea on exertion, edema or irregular heartbeat Gastrointestinal ROS: negative for - abdominal pain, diarrhea, hematemesis, nausea/vomiting or stool incontinence Genito-Urinary ROS: negative for - dysuria, hematuria, incontinence or urinary frequency/urgency Musculoskeletal ROS: Positive for -joint pain and muscle weakness Neurological ROS: as noted in HPI Dermatological ROS: negative for rash and skin lesion changes  Exam: Current vital signs: BP 133/66   Pulse 86   Temp 98.8 F (37.1 C) (Oral)   Resp (!) 25   SpO2 100%  Vital signs in last 24 hours: Temp:  [98.8 F (37.1 C)] 98.8 F (37.1 C) (07/05 1342) Pulse Rate:  [86-100] 86 (07/05 1540) Resp:  [22-25] 25 (07/05 1540) BP: (133-147)/(66-91) 133/66 (07/05 1540) SpO2:  [95 %-100 %] 100 % (07/05 1540)   Constitutional: Appears well-developed and well-nourished.  Psych: Affect appropriate to situation Eyes: No scleral injection HENT: No OP obstrucion Head: Normocephalic.  Cardiovascular: Normal rate and regular rhythm.  Respiratory: Labored breathing GI: Soft.  No distension. There is no tenderness.  Skin: WDI  Neuro: Mental Status: Patient is awake, alert, oriented to person, place, month, year, and situation.  Speech is clear with no dysarthria, naming intact, repetition intact.  She is able to follow commands to the best of her ability due to her body habitus. Cranial Nerves: II: Visual Fields are full.  III,IV, VI: EOMI without ptosis or diploplia. Pupils equal, round and reactive to light V: Facial sensation is symmetric to temperature VII: Slight right facial droop VIII: hearing is intact to voice X: Palat elevates symmetrically XI: Shoulder shrug is symmetric. XII: tongue is midline without atrophy or fasciculations.  Motor: Right arm is antigravity however shows significant drift down to the bed.  Distal is weaker than proximal.   Right leg shows antigravity and no drift.  Left leg is antigravity with no drift.  Left arm is 4/5. Sensory: Patient states she has decreased sensation in her right arm along with pain with touch.  Otherwise intact  deep Tendon Reflexes: 2+ and symmetric in the brachioradialis.  I cannot elicit knee jerk bilaterally. Plantars: Toes are downgoing bilaterally.  Cerebellar: Unable to do finger-nose-finger on the right secondary to weakness.  On the left finger-nose-finger was intact.  Due to body habitus and weakness patient was unable to take part in heel-to-shin  Labs I have reviewed labs in epic and the results pertinent to this consultation are:  CBC    Component Value Date/Time   WBC 8.1 08/01/2019 1349   RBC 4.58 08/01/2019 1349   HGB 12.6 08/01/2019 1358   HCT  37.0 08/01/2019 1358   HCT 22.8 (L) 06/19/2017 0726   PLT 280 08/01/2019 1349   MCV 89.5 08/01/2019 1349   MCH 24.9 (L) 08/01/2019 1349   MCHC 27.8 (L) 08/01/2019 1349   RDW 15.2 08/01/2019 1349   LYMPHSABS 2.6 08/01/2019 1349   MONOABS 0.7 08/01/2019 1349   EOSABS 0.2 08/01/2019 1349   BASOSABS 0.1 08/01/2019 1349    CMP     Component Value Date/Time   NA 144 08/01/2019 1358   NA 146 10/31/2015 0000   K 4.3 08/01/2019 1358   CL 107 08/01/2019 1358   CO2 20 (L) 08/01/2019 1349   GLUCOSE 95 08/01/2019 1358   BUN 24 (H) 08/01/2019 1358   BUN 23 (A) 10/31/2015 0000   CREATININE 1.40 (H) 08/01/2019 1358   CALCIUM 9.3 08/01/2019 1349   PROT 6.9 08/01/2019 1349   ALBUMIN 2.8 (L) 08/01/2019 1349   AST 29 08/01/2019 1349   ALT 13 08/01/2019 1349   ALKPHOS 47 08/01/2019 1349   BILITOT 1.0 08/01/2019 1349   GFRNONAA 33 (L) 08/01/2019 1349   GFRAA 39 (L) 08/01/2019 1349    Imaging I have reviewed the images obtained:  CT-scan of the brain-no acute intracranial pathology.  Small vessel white matter disease.  MRI examination of the brain-small acute cortical/subcortical infarct affecting the left pre and post  central gyri.  This finding could account for the patient's report of right hand weakness  Etta Quill PA-C [facility] [phone]  M-F  (9:00 am- 5:00 PM)  08/01/2019, 5:38 PM   I have seen the patient reviewed the above note.  Assessment: 74 year old female presenting to the hospital after sudden onset of right arm weakness along with decreased sensation.  She has two strokes on her MRI, one in the cerebellum and one in the anterior circulation on the left which is likely causing her symptoms.  I suspect that these are likely embolic phenomena and she will need an embolic stroke work-up.  Impression: -Stroke -Right arm weakness -Right arm decreased sensation -Right arm hypnesthesia  Recommend -MRA Head and neck -Transthoracic Echo  -We will load with 300 mg Plavix now, starting tomorrow she will be on 81 mg aspirin and 75 mg Plavix for 3 weeks  -Start or continue Atorvastatin 80 mg/other high intensity statin -BP goal: permissive HTN upto 220/120 mmHg -HBAIC and Lipid profile -Telemetry monitoring -Frequent neuro checks -NPO until passes stroke swallow screen -PT/OT # please page stroke NP  Or  PA  Or MD from 8am -4 pm  as this patient from this time will be  followed by the stroke.   You can look them up on [email or web address]  Password [email or web address]  Roland Rack, MD [facility] [phone]  If 7pm- 7am, please page neurology on call as listed in [place].

## 2019-08-01 NOTE — ED Provider Notes (Signed)
MSE was initiated and I personally evaluated the patient and placed orders (if any) at  1:53 PM on August 01, 2019.  The patient appears stable so that the remainder of the MSE may be completed by another provider.  Patient placed in Quick Look pathway, seen and evaluated   Chief Complaint: Right hand weakness  HPI:   74 year old female with a past medical history of hypertension, hyperlipidemia, rheumatoid arthritis presenting to the ED with a chief complaint of right hand weakness.  Woke up this morning with no strength in her right hand.  States that her hand has been swollen and painful on and off for the past several weeks, but she was still able to use it.  States that when she woke up she was unable to grip anything with her hand.  She denies any neck pain, headache, blurry vision, weakness or numbness in lower extremities, injuries or falls.  ROS: hand weakness (one)  Physical Exam:   Gen: No distress  Neuro: Awake and Alert  Skin: Warm    Focused Exam: Normal sensation to R hand (states it feels cold when I touch it). She is able to move her wrist but unable to move any of the digits of her right hand.  2+ radial pulse palpated.   Initiation of care has begun. The patient has been counseled on the process, plan, and necessity for staying for the completion/evaluation, and the remainder of the medical screening examination  Patient not a candidate for code stroke as she woke up this morning with her symptoms.  However due to physical exam findings, will order labwork and imaging of her head. I have informed triage RN that patient needs room soon for potential evaluation by neurology if needed.   Delia Heady, PA-C 08/01/19 [place], [place], DO 08/01/19 1447

## 2019-08-01 NOTE — H&P (Addendum)
[facility] Admission History and Physical Service Pager: [phone]  Patient name: [name] Medical record number: [id number] Date of birth: 1945/06/04 Age: 74 y.o. Gender: female  Primary Care Provider: Dixie Dials, MD Consultants: Neuro Code Status: Full  Preferred Emergency Contact: [name], daughter, [phone]  Chief Complaint: "R hand weakness"   Assessment and Plan: [name] is a 74 y.o. female presenting with right hand weakness. PMH is significant for hyperlipidemia, hypertension, anemia, and rheumatoid arthritis.  Acute CVA: Stable.  [name] presented to the ED this afternoon for right hand and right leg weakness.  She noticed her right hand weakness around 8 AM this morning, saying that she could not "use her hand".  She is able to flex and extend her right elbow, but her right shoulder and right wrist remains weak.  She can flex and extend her right fingers, but not fully.  LUE strength 4/5, RUE strength 2/5.  LLE strength 4/5, RLE strength 3/5.  Cranial nerves remain intact, see physical exam below.  CT and MRI imaging in ED was limited by motion artifact, however MR brain w/o contrast showed small acute cortical/subcortical infarct affecting the left pre and postcentral gyri, additional subcentimeter acute infarct within the right cerebellum. Neuro consulted by ED, suspicious for embolic etiology. No known history of A fib/flutter and not present on EKG. RF including age, HLD, HTN.  Clopidogrel 300 mg p.o. once received in ED 7/5.  -Admit to telemetry med with Dr. Andria Frames attending -Follow-up MRA head without contrast --Monitor telemetry  --Carotid U/S --Risk start A1c and lipid panel --ASA + plavix --Transition to crestor from simvastatin  --Will likely need cardiac monitor or loop recorder given embolic concern  --PT/OT/Speech -Frequent neuro checks -Fall precautions, up with assistance -Monitor vitals, allow permissive  hypertension -Echo -Bedside swallow study, full diet pending  Hypertension: Chronic, stable.  Takes metoprolol 37m BID at home.  -Hold home hypertensive medicines to allow permissive hypertension  CKD Stage II: Stable.  Creatinine 1.54 on admit, last Cr 1.1 in 2019, unclear if she has had progression since this time.  Hemoglobin 11.4 > 12.6.  UA shows rare bacteria, present hyaline cast, present mucus. --Monitor BMP in am   Rheumatoid arthritis: Chronic, stable.  Takes Etanercept weekly (on fridays), plaquenil, and lefunomide for control. Additionally on chronic prednisone 58m Follows with rheumatology outpatient.  -Continue home meds   Hyperlipidemia: Chronic, stable.  -Transition to Crestor 2065mrom home simvastatin for high intensity secondary prevention  Chronic pain syndrome: Stable.  UDS positive only for opiates. Takes norco PRN at home, PMP aware reviewed and appropriate. #90 filled on 6/14.  --Continue home regimen   Hypothyroidism: Chronic, stable.  -Continue home levothyroxine 50 mcg daily.  FEN/GI: Full diet pending passing bedside swallow Prophylaxis: Lovenox   Disposition: Telemetry med  History of Present Illness:  [name] a 74 6o. female with a history of HTN, RA, and history of pancytopenia presenting with acute right arm weakness.  Reports sudden onset R hand weakness since this morning (around 7-8am), states she "can't control it." Prior to this states her bilateral hands have been more sore than usual for the past several weeks. Initially denies any weakness elsewhere, however later reports her right leg seems weak. Denies any facial droop, slurred speech, numbness/tingling, HA, visual changes. Otherwise, has been at her baseline. Eating and drinking as normal. No falls or trauma to the region. Quit smoking in 2004. Denies alcohol or illicit  drug use.   Also has low back pain, sharp. 8/10 without weakness/numbness, bowel/bladder incontinence, saddle  anesthesia. This has been present chronically. Reports cough for 1 day, no fever or difficulty breathing. No known sick or COVID contacts. She also has known leg swelling, left always larger than the right. Can not tolerate compression stockings.   ED Course: On arrival she was afebrile and hemodynamically stable. Neuro consulted in ED following MRI, recommended admit for stroke w/u. Loaded with plavix.   Review Of Systems: Per HPI with the following additions:  Review of Systems  Constitutional: Negative for chills, fatigue and fever.  Respiratory: Positive for cough. Negative for shortness of breath and wheezing.   Cardiovascular: Negative for chest pain.  Gastrointestinal: Negative for abdominal pain, constipation, diarrhea, nausea and vomiting.  Genitourinary: Negative for difficulty urinating and dysuria.  Musculoskeletal: Positive for back pain and joint swelling.  Neurological: Positive for weakness. Negative for dizziness, syncope, light-headedness, numbness and headaches.  Psychiatric/Behavioral: Negative for agitation and behavioral problems.     Patient Active Problem List   Diagnosis Date Noted  . Pain due to onychomycosis of toenails of both feet 08/31/2018  . Oxygen dependent   . Fever   . Decreased range of motion of left shoulder   . 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 Other pancytopenia ([place])   . Methotrexate toxicity   . 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 Acute diverticulitis   . Ventral hernia without obstruction or gangrene   . Cellulitis of leg, left   . Morbid obesity ([place])   . Diverticulitis 06/18/2017  . 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 S/P revision of total knee 10/24/2015    Past Medical History: Past Medical History:  Diagnosis Date  . Anemia    takes Ferrous Sulfate daily  . Arthritis   . Chronic bronchitis ([place])    Combivent if needed)  . Colostomy care [facility]) 2004  . Coronary artery disease   . Diverticular disease   . Dizziness    . Dry eyes    uses Restasis daily  . H/O hiatal hernia   . History of blood transfusion    no abnormal reaction noted  . History of colon polyps    benign  . Hyperlipidemia    takes Zocor daily  . Hypertension    takes Metoprolol daily  . Hypothyroidism    takes Synthroid daily  . Nocturia    states only d/t being on Lasix  . Numbness and tingling    left foot  . Peripheral edema    takes Lasix daily  . Rheumatoid arthritis(714.0)    Methotrexate 2 times a week  . Shortness of breath    with exertion    Past Surgical History: Past Surgical History:  Procedure Laterality Date  . ABDOMINAL HYSTERECTOMY  1965  . CAPSULOTOMY Bilateral 06/01/2012   Procedure: MINOR CAPSULOTOMY;  Surgeon: Myrtha Mantis., MD;  Location: [place];  Service: Ophthalmology;  Laterality: Bilateral;  . CATARACT EXTRACTION W/PHACO  01/01/2011   Procedure: CATARACT EXTRACTION PHACO AND INTRAOCULAR LENS PLACEMENT (IOC);  Surgeon: [name], MD;  Location: [place];  Service: Ophthalmology;  Laterality: Left;  . CATARACT EXTRACTION W/PHACO  12/24/2011   Procedure: CATARACT EXTRACTION PHACO AND INTRAOCULAR LENS PLACEMENT (IOC);  Surgeon: [name], MD;  Location: [place];  Service: Ophthalmology;  Laterality: Right;  . COLON SURGERY  2002   d/t diverticulosis  . COLONOSCOPY    . COLOSTOMY    .  ESOPHAGOGASTRODUODENOSCOPY    . EYE SURGERY    . KNEE ARTHROSCOPY     bilateral  . TONSILLECTOMY    . TOTAL KNEE ARTHROPLASTY  2005/2008   bilateral  . TOTAL KNEE REVISION Right 10/24/2015   Procedure: TOTAL KNEE REVISION;  Surgeon: [name], MD;  Location: [place];  Service: Orthopedics;  Laterality: Right;  . YAG LASER APPLICATION Bilateral 06/01/2561   Procedure: YAG LASER APPLICATION;  Surgeon: Myrtha Mantis., MD;  Location: [place];  Service: Ophthalmology;  Laterality: Bilateral;    Social History: Social History   Tobacco Use  . Smoking status: Former Smoker    Quit date: 10/22/2002     Years since quitting: 16.7  . Smokeless tobacco: Never Used  . Tobacco comment: quit smoking in 2004  Substance Use Topics  . Alcohol use: No    Comment: quit drinking in 2004  . Drug use: No    Family History: Family History  Problem Relation Age of Onset  . Anesthesia problems Neg Hx   . Hypotension Neg Hx   . Malignant hyperthermia Neg Hx   . Pseudochol deficiency Neg Hx     Allergies and Medications: No Known Allergies No current facility-administered medications on file prior to encounter.   Current Outpatient Medications on File Prior to Encounter  Medication Sig Dispense Refill  . cycloSPORINE (RESTASIS) 0.05 % ophthalmic emulsion Place 1 drop into both eyes 3 (three) times daily as needed (dry eyes).     Marland Kitchen etanercept (ENBREL) 50 MG/ML injection Inject 50 mg into the skin every Friday.    . folic acid (FOLVITE) 1 MG tablet Take 1 mg by mouth daily.      Marland Kitchen HYDROcodone-acetaminophen (NORCO) 10-325 MG tablet Take 1 tablet by mouth 3 (three) times daily as needed for severe pain.     . hydroxychloroquine (PLAQUENIL) 200 MG tablet Take 200 mg by mouth 2 (two) times daily.    Marland Kitchen leflunomide (ARAVA) 10 MG tablet Take 10 mg by mouth daily.    Marland Kitchen levothyroxine (SYNTHROID, LEVOTHROID) 50 MCG tablet Take 50 mcg by mouth daily.      . metoprolol tartrate (LOPRESSOR) 50 MG tablet Take 50 mg by mouth 2 (two) times daily.    . naproxen sodium (ALEVE) 220 MG tablet Take 220 mg by mouth daily as needed.    . predniSONE (DELTASONE) 5 MG tablet Take 5 mg by mouth daily.    . simvastatin (ZOCOR) 40 MG tablet Take 40 mg by mouth at bedtime.       Objective: BP (!) 188/87 (BP Location: Right Arm)   Pulse 87   Temp 98.8 F (37.1 C) (Oral)   Resp 17   SpO2 97%   Physical Exam Constitutional:      General: She is not in acute distress.    Appearance: She is obese. She is not ill-appearing.  HENT:     Mouth/Throat:     Mouth: Mucous membranes are moist.     Pharynx: Oropharynx is  clear.  Eyes:     General: Lids are normal. Vision grossly intact.     Extraocular Movements: Extraocular movements intact.     Right eye: Normal extraocular motion and no nystagmus.     Left eye: Normal extraocular motion and no nystagmus.     Conjunctiva/sclera: Conjunctivae normal.     Pupils: Pupils are equal, round, and reactive to light.  Cardiovascular:     Rate and Rhythm: Normal rate and regular rhythm.  Pulses: Normal pulses.          Dorsalis pedis pulses are 2+ on the right side and 2+ on the left side.     Heart sounds: Normal heart sounds.  Pulmonary:     Effort: Pulmonary effort is normal. No respiratory distress or retractions.     Breath sounds: Normal breath sounds. No wheezing or rhonchi.  Musculoskeletal:     Right lower leg: Edema (1+) present.     Left lower leg: Edema (2+) present.  Feet:     Right foot:     Skin integrity: Warmth, callus and dry skin present.     Toenail Condition: Right toenails are abnormally thick and long.     Left foot:     Skin integrity: Warmth, callus and dry skin present.     Toenail Condition: Left toenails are abnormally thick and long.  Skin:    General: Skin is warm and dry.  Neurological:     Mental Status: She is alert and oriented to person, place, and time.     GCS: GCS eye subscore is 4. GCS verbal subscore is 5. GCS motor subscore is 6.     Cranial Nerves: Facial asymmetry (Smile slightly unequal, granddaughter wasn't sure if normal) present. No dysarthria.     Sensory: Sensory deficit (R face, hand, and foot had abnormal sensation) present.     Motor: Weakness (R shoulder, R wrist, R leg) present.     Coordination: Heel to Shin Test abnormal (Unable to do with either leg).     Comments: Speech easily understandable. Follows commands appropriately.  5/5 strength within bicep/triceps, otherwise difficulty moving through R shoulder and wrist/finger joints.   Psychiatric:        Attention and Perception: Attention  normal.        Mood and Affect: Mood normal.        Speech: Speech normal.        Behavior: Behavior normal. Behavior is not slowed or withdrawn. Behavior is cooperative.        Thought Content: Thought content normal.        Cognition and Memory: Cognition normal.        Judgment: Judgment normal.     Cold on the R face.   Labs and Imaging: CBC BMET  Recent Labs  Lab 08/01/19 1349 08/01/19 1349 08/01/19 1358  WBC 8.1  --   --   HGB 11.4*   < > 12.6  HCT 41.0   < > 37.0  PLT 280  --   --    < > = values in this interval not displayed.   Recent Labs  Lab 08/01/19 1349 08/01/19 1349 08/01/19 1358  NA 141   < > 144  K 4.3   < > 4.3  CL 109   < > 107  CO2 20*  --   --   BUN 22   < > 24*  CREATININE 1.54*   < > 1.40*  GLUCOSE 95   < > 95  CALCIUM 9.3  --   --    < > = values in this interval not displayed.     EKG: Sinus rhythm, 87 bpm, RBBB consistent with 2015 EKG available    Ezequiel Essex, MD 08/01/2019, 6:50 PM PGY-1, Reading Intern pager: [phone], text pages welcome  FPTS Upper-Level Resident Addendum   I have independently interviewed and examined the patient. I have discussed the above with the original  Chief Strategy Officer and agree with their documentation. My edits for correction/addition/clarification are in green. Please see also any attending notes.    [name], DO  Family Medicine PGY-3

## 2019-08-01 NOTE — ED Triage Notes (Signed)
Pt arrives to ED w/ c/o R hand weakness. Pt states she has been having edema and pain in over the last several weeks, however today pt woke up at 1300 today and was not able to use R hand.

## 2019-08-01 NOTE — ED Notes (Signed)
Patient transported to MRI via strecher

## 2019-08-01 NOTE — Progress Notes (Signed)
MD at bedside. Requested that VAST return latter. VU. Fran Lowes, RN VAST

## 2019-08-01 NOTE — Progress Notes (Signed)
Received pt from the ED, alert and oriented, placed on tele, implemented MD orders

## 2019-08-02 ENCOUNTER — Inpatient Hospital Stay (HOSPITAL_COMMUNITY): Payer: Medicare Other

## 2019-08-02 DIAGNOSIS — I639 Cerebral infarction, unspecified: Secondary | ICD-10-CM

## 2019-08-02 DIAGNOSIS — I351 Nonrheumatic aortic (valve) insufficiency: Secondary | ICD-10-CM

## 2019-08-02 DIAGNOSIS — I10 Essential (primary) hypertension: Secondary | ICD-10-CM

## 2019-08-02 DIAGNOSIS — I350 Nonrheumatic aortic (valve) stenosis: Secondary | ICD-10-CM

## 2019-08-02 LAB — BASIC METABOLIC PANEL
Anion gap: 9 (ref 5–15)
BUN: 19 mg/dL (ref 8–23)
CO2: 22 mmol/L (ref 22–32)
Calcium: 8.9 mg/dL (ref 8.9–10.3)
Chloride: 110 mmol/L (ref 98–111)
Creatinine, Ser: 1.16 mg/dL — ABNORMAL HIGH (ref 0.44–1.00)
GFR calc Af Amer: 54 mL/min — ABNORMAL LOW (ref >60)
GFR calc non Af Amer: 47 mL/min — ABNORMAL LOW (ref >60)
Glucose, Bld: 71 mg/dL (ref 70–99)
Potassium: 4.6 mmol/L (ref 3.5–5.1)
Sodium: 141 mmol/L (ref 135–145)

## 2019-08-02 LAB — ECHOCARDIOGRAM COMPLETE
Height: 63 in
Weight: 4522.08 oz

## 2019-08-02 LAB — LIPID PANEL
Cholesterol: 121 mg/dL (ref 0–200)
HDL: 19 mg/dL — ABNORMAL LOW (ref >40)
LDL Cholesterol: 76 mg/dL (ref 0–99)
Total CHOL/HDL Ratio: 6.4 RATIO
Triglycerides: 131 mg/dL (ref <150)
VLDL: 26 mg/dL (ref 0–40)

## 2019-08-02 LAB — HEMOGLOBIN A1C
Hgb A1c MFr Bld: 5.7 % — ABNORMAL HIGH (ref 4.8–5.6)
Mean Plasma Glucose: 116.89 mg/dL

## 2019-08-02 MED ORDER — PANTOPRAZOLE SODIUM 20 MG PO TBEC
20.0000 mg | DELAYED_RELEASE_TABLET | Freq: Every day | ORAL | Status: DC
  Administered 2019-08-02 – 2019-08-06 (×5): 20 mg via ORAL
  Filled 2019-08-02 (×5): qty 1

## 2019-08-02 MED ORDER — ROSUVASTATIN CALCIUM 20 MG PO TABS
20.0000 mg | ORAL_TABLET | Freq: Every day | ORAL | Status: DC
  Administered 2019-08-02 – 2019-08-06 (×5): 20 mg via ORAL
  Filled 2019-08-02 (×4): qty 1
  Filled 2019-08-02: qty 4

## 2019-08-02 NOTE — Progress Notes (Signed)
STROKE TEAM PROGRESS NOTE   INTERVAL HISTORY I personally reviewed history of presenting illness with the patient, imaging films in PACS and electronic medical records.  She presented with right upper extremity weakness.  MRI scan shows embolic-looking left frontal MCA branch and right cerebellar infarcts.  MRI of the brain shows no significant large vessel stenosis or occlusion.  LDL cholesterol 76 mg percent.  Hemoglobin A1c is 5.7.  Carotid ultrasound and echocardiogram are both pending  Vitals:   08/01/19 2336 08/02/19 0138 08/02/19 0339 08/02/19 0810  BP: (!) 157/66 (!) 171/68 (!) 136/55 139/69  Pulse: 88 88 80 79  Resp: _0 Temp: 98.9 F (37.2 C) 99.1 F (37.3 C) 98 F (36.7 C) 98.2 F (36.8 C)  TempSrc: Oral Oral Oral Oral  SpO2: 97% 97% 96% 97%  Weight:      Height:        CBC:  Recent Labs  Lab 08/01/19 1349 08/01/19 1358  WBC 8.1  --   NEUTROABS 4.5  --   HGB 11.4* 12.6  HCT 41.0 37.0  MCV 89.5  --   PLT 280  --     Basic Metabolic Panel:  Recent Labs  Lab 08/01/19 1349 08/01/19 1358  NA 141 144  K 4.3 4.3  CL 109 107  CO2 20*  --   GLUCOSE 95 95  BUN 22 24*  CREATININE 1.54* 1.40*  CALCIUM 9.3  --    Lipid Panel:     Component Value Date/Time   CHOL 121 08/02/2019 0552   TRIG 131 08/02/2019 0552   HDL 19 (L) 08/02/2019 0552   CHOLHDL 6.4 08/02/2019 0552   VLDL 26 08/02/2019 0552   LDLCALC 76 08/02/2019 0552   HgbA1c:  Lab Results  Component Value Date   HGBA1C 5.7 (H) 08/02/2019   Urine Drug Screen:     Component Value Date/Time   LABOPIA POSITIVE (A) 08/01/2019 1518   COCAINSCRNUR NONE DETECTED 08/01/2019 1518   LABBENZ NONE DETECTED 08/01/2019 1518   AMPHETMU NONE DETECTED 08/01/2019 1518   THCU NONE DETECTED 08/01/2019 1518   LABBARB NONE DETECTED 08/01/2019 1518    Alcohol Level     Component Value Date/Time   ETH <10 08/01/2019 1349    IMAGING past 24 hours CT HEAD WO CONTRAST  Result Date: 08/01/2019 CLINICAL  DATA:  Woke up with right hand weakness EXAM: CT HEAD WITHOUT CONTRAST CT CERVICAL SPINE WITHOUT CONTRAST TECHNIQUE: Multidetector CT imaging of the head and cervical spine was performed following the standard protocol without intravenous contrast. Multiplanar CT image reconstructions of the cervical spine were also generated. COMPARISON:  None. FINDINGS: CT HEAD FINDINGS Brain: No evidence of acute infarction, hemorrhage, hydrocephalus, extra-axial collection or mass lesion/mass effect. Periventricular and deep white matter hypodensity. Vascular: No hyperdense vessel or unexpected calcification. Skull: Normal. Negative for fracture or focal lesion. Sinuses/Orbits: No acute finding. Other: None. CT CERVICAL SPINE FINDINGS Examination of the cervical spine is significantly limited by motion artifact. Alignment: Normal. Skull base and vertebrae: No acute fracture. No primary bone lesion or focal pathologic process. Soft tissues and spinal canal: No prevertebral fluid or swelling. No visible canal hematoma. Disc levels: Mild to moderate disc space height loss and osteophytosis of the lower cervical levels, worst at C5 through C7. Upper chest: Negative. Other: None. IMPRESSION: 1. No acute intracranial pathology. Small-vessel white matter disease. Consider MRI to further evaluate for acute diffusion restricting infarction if suspected based on clinical signs and symptoms. 2.  Examination of the cervical spine is significantly limited by motion artifact. Within this limitation, no obvious fracture or static subluxation of the cervical spine. 3. Mild to moderate disc space height loss and osteophytosis of the lower cervical levels, worst at C5 through C7. Electronically Signed   By: Eddie Candle M.D.   On: 08/01/2019 15:30   CT Cervical Spine Wo Contrast  Result Date: 08/01/2019 CLINICAL DATA:  Woke up with right hand weakness EXAM: CT HEAD WITHOUT CONTRAST CT CERVICAL SPINE WITHOUT CONTRAST TECHNIQUE: Multidetector CT  imaging of the head and cervical spine was performed following the standard protocol without intravenous contrast. Multiplanar CT image reconstructions of the cervical spine were also generated. COMPARISON:  None. FINDINGS: CT HEAD FINDINGS Brain: No evidence of acute infarction, hemorrhage, hydrocephalus, extra-axial collection or mass lesion/mass effect. Periventricular and deep white matter hypodensity. Vascular: No hyperdense vessel or unexpected calcification. Skull: Normal. Negative for fracture or focal lesion. Sinuses/Orbits: No acute finding. Other: None. CT CERVICAL SPINE FINDINGS Examination of the cervical spine is significantly limited by motion artifact. Alignment: Normal. Skull base and vertebrae: No acute fracture. No primary bone lesion or focal pathologic process. Soft tissues and spinal canal: No prevertebral fluid or swelling. No visible canal hematoma. Disc levels: Mild to moderate disc space height loss and osteophytosis of the lower cervical levels, worst at C5 through C7. Upper chest: Negative. Other: None. IMPRESSION: 1. No acute intracranial pathology. Small-vessel white matter disease. Consider MRI to further evaluate for acute diffusion restricting infarction if suspected based on clinical signs and symptoms. 2. Examination of the cervical spine is significantly limited by motion artifact. Within this limitation, no obvious fracture or static subluxation of the cervical spine. 3. Mild to moderate disc space height loss and osteophytosis of the lower cervical levels, worst at C5 through C7. Electronically Signed   By: Eddie Candle M.D.   On: 08/01/2019 15:30   MR ANGIO HEAD WO CONTRAST  Result Date: 08/01/2019 CLINICAL DATA:  Follow-up examination for acute stroke. EXAM: MRA HEAD WITHOUT CONTRAST TECHNIQUE: Angiographic images of the Circle of Willis were obtained using MRA technique without intravenous contrast. COMPARISON:  Prior MRI from earlier the same day. FINDINGS: ANTERIOR  CIRCULATION: Examination moderately to severely degraded by motion artifact. Visualized distal cervical segments of the internal carotid arteries are patent with antegrade flow. Petrous, cavernous, and supraclinoid ICAs patent without obvious flow-limiting stenosis or other abnormality. A1 segments patent bilaterally. Grossly normal anterior communicating artery complex. Anterior cerebral arteries patent to their distal aspects without stenosis. No M1 stenosis or occlusion. Grossly normal MCA bifurcations. Distal MCA branches well perfused and grossly symmetric. POSTERIOR CIRCULATION: Dominant left vertebral artery grossly patent to the vertebrobasilar junction without appreciable stenosis. Left PICA patent proximally. Right vertebral artery diffusely hypoplastic, and is not well seen as it courses into the skull base, and may be partially occluded. Flow related signal is seen within the right V4 segment distally, which could be partially collateral in nature. Right PICA not well seen. Basilar grossly patent to its distal aspect without stenosis. Superior cerebral arteries patent proximally. Both PCAs primarily supplied via the basilar, although small bilateral posterior communicating arteries noted. PCAs grossly perfused to their distal aspects without stenosis. No appreciable intracranial aneurysm. IMPRESSION: 1. Technically limited exam due to extensive motion artifact. 2. Hypoplastic right vertebral artery, nonvisualized as it courses into the cranial vault, and could be occluded. Flow related signal seen within the right V4 segment distally, which could be collateral nature. Right PICA  not definitely seen. Dominant left vertebral artery widely patent. 3. Otherwise gross patency of the major arterial intracranial arterial circulation. No other large vessel occlusion. No definite hemodynamically significant or correctable stenosis. Electronically Signed   By: Jeannine Boga M.D.   On: 08/01/2019 20:50    MR BRAIN WO CONTRAST  Result Date: 08/01/2019 CLINICAL DATA:  Right hand weakness. EXAM: MRI HEAD WITHOUT CONTRAST TECHNIQUE: Multiplanar, multiecho pulse sequences of the brain and surrounding structures were obtained without intravenous contrast. COMPARISON:  Noncontrast head CT performed earlier the same day 08/01/2019. FINDINGS: Brain: The patient was unable to tolerate the full examination. As a result, only axial and coronal diffusion-weighted sequences as well as a sagittal T1 weighted sequence could be obtained. There is moderate/severe motion degradation of the sagittal T1 weighted sequence. There is a small focus of cortical/subcortical restricted diffusion affecting the left pre and postcentral gyri consistent with acute infarction (for instance as seen on series 2, image 37). These findings would account for the reported right hand weakness. Additional small acute infarct within the right cerebellum (series 2, image 13) Vascular: Poorly assessed on the acquired sequences. Skull and upper cervical spine: Focal marrow lesion identified on the acquired sequences. Sinuses/Orbits: Poorly assessed on the acquired sequences. IMPRESSION: Prematurely terminated, motion degraded and limited examination as described. Small acute cortical/subcortical infarct affecting the left pre- and postcentral gyri. This finding would account for the patient's reported right hand weakness. Additional subcentimeter acute infarct within the right cerebellum. Electronically Signed   By: Kellie Simmering DO   On: 08/01/2019 17:10    PHYSICAL EXAM Obese elderly African-American lady not in distress. . Afebrile. Head is nontraumatic. Neck is supple without bruit.    Cardiac exam no murmur or gallop. Lungs are clear to auscultation. Distal pulses are well felt. Neurological Exam :  Awake alert oriented to time place and person.  Speech is normal without dysarthria or aphasia.  Extraocular movements are full range without  nystagmus.  Blinks to threat bilaterally.  Mild right lower facial weakness.  Tongue midline.  Motor system exam shows mild right upper extremity drift and weakness distally with diminished fine finger movements and orbits left over right upper extremity.  Mild left finger-to-nose dysmetria.  Sensation is diminished in the right arm and right leg subjectively.  Deep tendon reflexes symmetric.  Gait not tested. ASSESSMENT/PLAN [name] is a 74 y.o. female with history of HTN, HLD, CAD presenting with RUE weakness.   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   CT CS no fx or acute abnormality  MRI  Small cortical/ subcortical L pre+postcentral gyri. Subcentimeter R cerebellar infarct.  MRA  Gross patency, R VA and R PICA not well seen  Carotid Doppler  B ICA 1-39% stenosis, VAs antegrade   2D Echo status post   [facility] electrophysiologist will consult and consider placement of an implantable loop recorder to evaluate for atrial fibrillation as etiology of stroke. This has been explained to patient/family by Dr. Leonie Man and they are agreeable.   LDL 76  HgbA1c 5.7  Lovenox 40 mg sq daily for VTE prophylaxis  No antithrombotic prior to admission, now on aspirin 81 mg daily and clopidogrel 75 mg daily following plavix load    Therapy recommendations:  SNF  Disposition:  pending   Hypertension  Stable  Permissive hypertension (OK if <  220/120) but gradually normalize in 5-7 days  Long-term BP goal normotensive  Hyperlipidemia  Home meds:  zocor 40  Now on crestor 20  LDL 76, goal < 70  Continue statin at discharge  Other Stroke Risk Factors  Advanced age  Former Cigarette smoker  Morbid Obesity, Body mass index is 50.07 kg/m., recommend weight loss, diet and exercise as appropriate   Coronary artery disease  Other Active  Problems  R on weekly methotrexate, plaquenil, lefunomide, prednisone   Chronic pain syndrome  Hypothyroidism  CKD stage II   Hospital day # 1 She presented with right upper extremity weakness secondary to embolic left MCA as well as left cerebellar infarcts etiology to be determined but strong suspicion for paroxysmal A. fib.  Recommend continue ongoing stroke work-up and loop recorder for paroxysmal A. fib upon discharge.  Aspirin and Plavix for 3 weeks followed by aspirin alone.  Patient may consider possible participation in the [place] stroke prevention study if interested and was given information to review and decide.. Greater than 50% time during this 35-minute visit was spent on counseling and coordination of care about her embolic stroke and discussion about stroke prevention, evaluation and treatment and answering questions Antony Contras, MD   To contact Stroke Continuity provider, please refer to [email or web address] After hours, contact General Neurology

## 2019-08-02 NOTE — Progress Notes (Signed)
Carotid duplex bilateral study completed.   See Cv Proc for preliminary results.   [name]

## 2019-08-02 NOTE — Evaluation (Signed)
Physical Therapy Evaluation Patient Details Name: [name] MRN: [id number] DOB: September 14, 1945 Today's Date: 08/02/2019   History of Present Illness  Pt is a 74 y/o female who presents with RUE weakness and decreased sensation. MRI revealed cerebellar and cortical/subcortical infarcts effecting the pre and post central gyri.  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   Clinical Impression  Pt admitted with above diagnosis. At the time of PT eval pt was able to perform transfers with up to +2 mod assist for balance support and safety. Pt was provided with support under the R elbow during standing activity, and was able to take pivotal steps around to the chair however was unable to progress to gait training at this time. Pt lives alone and is agreeable to SNF level therapies at d/c to maximize functional independence and safety, as pt is hopeful for eventual return home. Pt currently with functional limitations due to the deficits listed below (see PT Problem List). Pt will benefit from skilled PT to increase their independence and safety with mobility to allow discharge to the venue listed below.       Follow Up Recommendations SNF;Supervision/Assistance - 24 hour    Equipment Recommendations  None recommended by PT    Recommendations for Other Services       Precautions / Restrictions Precautions Precautions: Fall Restrictions Weight Bearing Restrictions: No      Mobility  Bed Mobility               General bed mobility comments: Pt sitting up EOB with OT when PT arrived.   Transfers Overall transfer level: Needs assistance Equipment used: 2 person hand held assist Transfers: Sit to/from [facility] Sit to Stand: Mod assist;+2 physical assistance Stand pivot transfers: Mod assist;+2 physical assistance       General transfer comment: Assist for power-up to full standing position, to gain and maintain  standing balance, and to take pivotal steps around to the chair. Increased time required for all aspects of mobility.   Ambulation/Gait         Gait velocity:     General Gait Details: Unable to progress to gait training this session  Stairs            Wheelchair Mobility    Modified Rankin (Stroke Patients Only) Modified Rankin (Stroke Patients Only) Pre-Morbid Rankin Score: Moderate disability Modified Rankin: Moderately severe disability     Balance Overall balance assessment: Needs assistance Sitting-balance support: Feet supported;No upper extremity supported Sitting balance-Leahy Scale: Fair Sitting balance - Comments: Sitting statically EOB.    Standing balance support: Bilateral upper extremity supported;During functional activity Standing balance-Leahy Scale: Poor Standing balance comment: Bilateral UE support required.                              Pertinent Vitals/Pain Pain Assessment: No/denies pain    Home Living Family/patient expects to be discharged to:: Private residence Living Arrangements: Alone Available Help at Discharge: Family;Available PRN/intermittently Type of Home: Apartment Home Access: Level entry     Home Layout: One level Home Equipment: Walker - 2 wheels;Grab bars - tub/shower      Prior Function Level of Independence: Needs assistance   Gait / Transfers Assistance Needed: Using a walker PTA  ADL's / Homemaking Assistance Needed: Daughter assists with cooking, cleaning. Pt dresses herself with pullover gowns.   Comments: Pt mainly stays at home  watching TV and has transportation to doctor's appointments.      Hand Dominance   Dominant Hand: Right    Extremity/Trunk Assessment   Upper Extremity Assessment Upper Extremity Assessment: Defer to OT evaluation;RUE deficits/detail RUE Deficits / Details: Decreased strength, AROM, coordination and sensation. See OT eval for more detail.     Lower Extremity  Assessment Lower Extremity Assessment: Generalized weakness    Cervical / Trunk Assessment Cervical / Trunk Assessment: Other exceptions Cervical / Trunk Exceptions: Forward head posture with rounded shoulders.   Communication   Communication: No difficulties  Cognition Arousal/Alertness: Awake/alert Behavior During Therapy: WFL for tasks assessed/performed Overall Cognitive Status: Within Functional Limits for tasks assessed                                        General Comments      Exercises     Assessment/Plan    PT Assessment Patient needs continued PT services  PT Problem List Decreased strength;Decreased range of motion;Decreased activity tolerance;Decreased balance;Decreased mobility;Decreased knowledge of use of DME;Decreased coordination;Decreased safety awareness;Decreased knowledge of precautions;Impaired sensation       PT Treatment Interventions DME instruction;Gait training;Functional mobility training;Therapeutic activities;Therapeutic exercise;Neuromuscular re-education;Patient/family education    PT Goals (Current goals can be found in the Care Plan section)  Acute Rehab PT Goals Patient Stated Goal: None stated during session PT Goal Formulation: With patient Time For Goal Achievement: 08/16/19 Potential to Achieve Goals: Good    Frequency Min 3X/week   Barriers to discharge Decreased caregiver support Lives alone without 24 hour support available    Co-evaluation               AM-PAC PT "6 Clicks" Mobility  Outcome Measure Help needed turning from your back to your side while in a flat bed without using bedrails?: A Little Help needed moving from lying on your back to sitting on the side of a flat bed without using bedrails?: A Little Help needed moving to and from a bed to a chair (including a wheelchair)?: A Lot Help needed standing up from a chair using your arms (e.g., wheelchair or bedside chair)?: A Lot Help needed to  walk in hospital room?: A Lot Help needed climbing 3-5 steps with a railing? : Total 6 Click Score: 13    End of Session Equipment Utilized During Treatment: Gait belt Activity Tolerance: Patient tolerated treatment well Patient left: in chair;with call bell/phone within reach Nurse Communication: Mobility status PT Visit Diagnosis: Unsteadiness on feet (R26.81);Hemiplegia and hemiparesis Hemiplegia - Right/Left: Right Hemiplegia - dominant/non-dominant: Dominant Hemiplegia - caused by: Cerebral infarction    Time: 9470-7615 PT Time Calculation (min) (ACUTE ONLY): 34 min   Charges:   PT Evaluation $PT Eval Moderate Complexity: 1 Mod PT Treatments $Gait Training: 8-22 mins        Rolinda Roan, PT, DPT Acute Rehabilitation Services Pager: [phone] Office: [phone]   Thelma Comp 08/02/2019, 11:45 AM

## 2019-08-02 NOTE — Progress Notes (Signed)
Family Medicine Teaching Service Daily Progress Note Intern Pager: [phone]  Patient name: [name] Medical record number: [id number] Date of birth: April 25, 1945 Age: 74 y.o. Gender: female  Primary Care Provider: Dixie Dials, MD Consultants: Neurology Code Status: Full   Pt Overview and Major Events to Date:  08/02/2019- Patient admitted and dx. With acute CVA  Assessment and Plan: Acute CVA: Stable.  [name] remains unable to use her right hand. She is able to flex and extend her right elbow, but her right wrist remains weak.  She cannot flex and extend her right fingers. Passive extension of the right fingers causes radiating pain down the fingers.  LUE strength 4/5, RUE strength 2/5.  LLE strength 4/5, RLE strength 4/5.  Cranial nerves remain intact with slight facial droop on right side, see physical exam below.  CT and MRI imaging in ED was limited by motion artifact, however MR brain w/o contrast showed small acute cortical/subcortical infarct affecting the left pre and postcentral gyri, additional subcentimeter acute infarct within the right cerebellum. Despite incomplete imaging neuro does not require a second MRI of the brain. MRA brain results concluded hypoplastic right ververtebral artery, nonvisualized as it courses into the cranial vault, and could be occluded. Flow related signal seen within the right V4 segment distally, which could be collateral nature. Right PICA not definitely seen. Dominant left vertebral artery widely patent. of  Neuro consulted,  suspicious for embolic etiology. No known history of A fib/flutter and not present on EKG. RF including age, HLD, HTN, and RA.  Clopidogrel 300 mg p.o. once received in ED 7/5. Patient was kept in permissive HTN, sys BP 114-188. Patient lipid panel found decreased HDL all other lab values were WNL.   -Admit to telemetry med with Dr. Andria Frames attending --Monitor telemetry  --Carotid U/S --A1c at admission is 5.7 A1c --ASA +  plavix --Transition to crestor from home simvastatin --Will likely need cardiac monitor or loop recorder given embolic concern  --PT/OT/ ST -- Patient passed her swallow study -Frequent neuro checks -Fall precautions, up with assistance -Monitor vitals, allow permissive hypertension -Echo  Hypertension: Chronic, stable.  Takes metoprolol 60m BID at home.  -Hold home hypertensive medicines to allow permissive hypertension - Sys BP 114-188  CKD Stage II: Stable.  Creatinine 1.54 on admit, with repeat Cr 1.4, will continue to monitor to assess if patient has had progression. Cr at previous visit was 1.1.  Hemoglobin 11.4 > 12.6.  UA shows rare bacteria, present hyaline cast, present mucus. HgbA1c at this admission is 5.7. --Monitor BMP in am   Rheumatoid arthritis: Chronic, stable.  Takes Etanercept weekly (on fridays), plaquenil, and leflunomide for control. Additionally on chronic prednisone 580m Follows with rheumatology outpatient.  -Continue home meds  - Start Protonix 206m Hyperlipidemia: Chronic, stable.  -May consider transition to Crestor 51m23mom home simvastatin for high intensity secondary prevention - Continue Crestor at home dose  Chronic pain syndrome: Stable.  UDS positive only for opiates. Takes norco PRN at home, PMP aware reviewed and appropriate. #90 filled on 6/14.  --Continue home regimen   Hypothyroidism: Chronic, stable.  -Continue home levothyroxine 50 mcg daily.    FEN/GI: Full diet PPx: Lovenox  Disposition: Inpatient:Telemetry med  Subjective:  Patient is resting comfortably in bed; however she notes that she is having trouble eating with one hand. Patient reports that she is aware that she had a stroke which is likely the cause of her sudden right handed weakness. Patient  had no other over the night events. She continues to have pain in her right hand and no use of of right hand.  Objective: Temp:  [97.9 F (36.6 C)-99.1 F (37.3  C)] 98 F (36.7 C) (07/06 0339) Pulse Rate:  [80-100] 80 (07/06 0339) Resp:  [14-25] 18 (07/06 0339) BP: (114-188)/(55-91) 136/55 (07/06 0339) SpO2:  [95 %-100 %] 96 % (07/06 0339) Weight:  [128.2 kg] 128.2 kg (07/05 2109) Physical Exam: General: Patient in bed, struggling to open her breakfast, but otherwise pleasant. Oriented to time, situation, and person. Place was not tested. Cardiovascular: Regular rate an rhythm no murmur auscultated Respiratory: rhonchi in the bilateral lower lobes,  R upper inspiratory and expiratory wheeze Abdomen: Patient has a colostomy bag, placed in 2005 post diverticulitis per patient. No erythema at the sight of colostomy bag. No pain to palpation.  Extremities: Bilateral 1+ pitting edema to the shin. Patient hx is chronic LLE edema. Able to move bilateral lower extremities. Distal pulses intact. Pain to palpation of lower extremities. Pain to the palpation of the dorsal right hand. Radiating pain with passive extension of the R digits. The R wrist appears to be more swollen than the left. Decreased ROM in the R hand due to pain. Neuro: CN in tact with the exception of decreased hearing acuity in the R ear and slight facial droop of the R side. 2/5 strength in the RUE, 1/5 grip in the R hand, 4/5 LUE, 4/5 L grip strength. 4/5 strength in the bilateral lower extremities.   Laboratory: Recent Labs  Lab 08/01/19 1349 08/01/19 1358  WBC 8.1  --   HGB 11.4* 12.6  HCT 41.0 37.0  PLT 280  --    Recent Labs  Lab 08/01/19 1349 08/01/19 1358  NA 141 144  K 4.3 4.3  CL 109 107  CO2 20*  --   BUN 22 24*  CREATININE 1.54* 1.40*  CALCIUM 9.3  --   PROT 6.9  --   BILITOT 1.0  --   ALKPHOS 47  --   ALT 13  --   AST 29  --   GLUCOSE 95 95   Urinalysis Urine dipstick shows positive for proteinuria and rare bacteria.    Imaging/Diagnostic Tests: 1. CT Head wo contrast  IMPRESSION: 1. No acute intracranial pathology. Small-vessel white matter disease.  Consider MRI to further evaluate for acute diffusion restricting infarction if suspected based on clinical signs and symptoms. 2. Examination of the cervical spine is significantly limited by motion artifact. Within this limitation, no obvious fracture or static subluxation of the cervical spine. 3. Mild to moderate disc space height loss and osteophytosis of the lower cervical levels, worst at C5 through C7.  2. CT Cervical Spine wo contrast  IMPRESSION: 1. No acute intracranial pathology. Small-vessel white matter disease. Consider MRI to further evaluate for acute diffusion restricting infarction if suspected based on clinical signs and symptoms. 2. Examination of the cervical spine is significantly limited by motion artifact. Within this limitation, no obvious fracture or static subluxation of the cervical spine. 3. Mild to moderate disc space height loss and osteophytosis of the lower cervical levels, worst at C5 through C7.  3. MRI brain wo contrast Prematurely terminated, motion degraded and limited examination as described.  Small acute cortical/subcortical infarct affecting the left pre- and postcentral gyri. This finding would account for the patient's reported right hand weakness.  4. MRA Head wo contrast IMPRESSION: 1. Technically limited exam due to extensive motion  artifact. 2. Hypoplastic right vertebral artery, nonvisualized as it courses into the cranial vault, and could be occluded. Flow related signal seen within the right V4 segment distally, which could be collateral nature. Right PICA not definitely seen. Dominant left vertebral artery widely patent. 3. Otherwise gross patency of the major arterial intracranial arterial circulation. No other large vessel occlusion. No definite hemodynamically significant or correctable stenosis.  Freida Busman, MD 08/02/2019, 6:52 AM PGY-1, [facility] Intern pager: [phone], text pages welcome

## 2019-08-02 NOTE — Consult Note (Signed)
[facility] Nurse ostomy consult note Stoma type/location: LLQ colostomy (Created in 2005) Stomal assessment/size: Oval, 1-inch x 2-inch, minimally budded, pink, moist Peristomal assessment: intact, clear Treatment options for stomal/peristomal skin: None required Output: soft light brown stool  Ostomy pouching: 1pc. Flexible pouch  Education provided: Patient is taught that we will try to standardize her established, but unconventional ostomy care regimen through the use of Ostomy Care Orders. I encourage her to talk the nursing staff through the procedure. Her right hand and arm are currently affected by the recent cerebral event.  Patient has a soft, obese abdomen with deep crevices and skin folds both medially and laterally, albeit the medial defects are more severe.  She has tried many methods of pouching to determine the best routine for repeatable results and has landed on the use of a one-piece flexible pouch with the aperture cut wide.  She rolls gauze 2x2s and places them into the medial defect, covers with Medipore tape.  Then places the ostomy pouch and uses Medipore tape to further secure. NOTE:  Patient uses and ostomy clamp, not the velcro-type closure.   While this is not standard procedure, it is effective in that the pouching systems last 2-4 days and her skin is intact.  Supplies ordered to bedside:  1 box (10 pouches) of 1-piece flat pouches, [facility] # [phone]. Stoma powder Kellie Simmering # 6)   Enrolled patient in [facility] program: No, patient is established with a provider of ostomy supplies.  [facility] nursing team will not follow, but will remain available to this patient, the nursing and medical teams.  Please re-consult if needed. Thanks, Maudie Flakes, MSN, RN, [place], Arther Abbott  Pager# [phone]

## 2019-08-02 NOTE — Evaluation (Signed)
Occupational Therapy Evaluation Patient Details Name: [name] MRN: [id number] DOB: 1945-09-12 Today's Date: 08/02/2019    History of Present Illness Pt is a 74 y/o female who presents with RUE weakness and decreased sensation. MRI revealed cerebellar and cortical/subcortical infarcts effecting the pre and post central gyri.  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    Clinical Impression   PTA pt reports being independent-Mod I with ADLs and needing assistance with IADLs - pt reports provided transportation to doctors visits. Pt was admitted for above and treated for problem list below (see OT Problem List). Pt is A&Ox4 with good disposition and agreeable to skilled-OT services. Requires set up - Max A +2 for ADLs due to decreased strength, coordination, and ROM in dominant (R) hand, generalized weakness, and deficits in balance. Requires Mod A +2 for transfers and ambulation with 2 person HHA and verbal cues for sequencing with stand pivot transfer to recliner. Reports her daughter lives with her 24/7 and has been assisting with IADLs. Pt reports he favorite thing to do at home is sit and watch tv shows - specifically CSI and other crime/detective shows. Believe pt would benefit from skilled OT services acutely and at the SNF level with 24/7 supervision to increase safety and return to PLOF.    Follow Up Recommendations  SNF;Supervision/Assistance - 24 hour    Equipment Recommendations  3 in 1 bedside commode       Precautions / Restrictions Precautions Precautions: Fall Restrictions Weight Bearing Restrictions: No      Mobility Bed Mobility Overal bed mobility: Needs Assistance Bed Mobility: Supine to Sit     Supine to sit: Mod assist;HOB elevated     General bed mobility comments: Mod A for elevating trunk and maintaining balance in sitting  Transfers Overall transfer level: Needs assistance Equipment used: 2 person hand  held assist Transfers: Sit to/from [facility] Sit to Stand: Mod assist;+2 physical assistance Stand pivot transfers: Mod assist;+2 physical assistance       General transfer comment: Mod A +2 to power up and maintain standing balance. Min verbal cues  for sequencing and direction of steps.     Balance Overall balance assessment: Needs assistance Sitting-balance support: Feet supported;No upper extremity supported Sitting balance-Leahy Scale: Fair Sitting balance - Comments: Sitting statically EOB.    Standing balance support: Bilateral upper extremity supported;During functional activity Standing balance-Leahy Scale: Poor Standing balance comment: 2 HHA needed                           ADL either performed or assessed with clinical judgement   ADL Overall ADL's : Needs assistance/impaired Eating/Feeding: Set up;Sitting Eating/Feeding Details (indicate cue type and reason): set up - pt reports daughter makes meals. Pt seen utilizing L hand (non-dominant) to eat breakfast Grooming: Set up;Sitting Grooming Details (indicate cue type and reason): set up - can adapt and clean face with L non-dominant hand Upper Body Bathing: Maximal assistance;Sitting Upper Body Bathing Details (indicate cue type and reason): Max A with UB bathing due to limited ROM in B UEs Lower Body Bathing: Maximal assistance;Sitting/lateral leans Lower Body Bathing Details (indicate cue type and reason): Max A in sitting with lateral leans due to decrease ROM in B UE's and poor sitting balance Upper Body Dressing : Maximal assistance;Cueing for sequencing;Cueing for compensatory techniques;Sitting Upper Body Dressing Details (indicate cue type and reason): Max A for UB  dressing due to decreased ROM in B UEs Lower Body Dressing: +2 for physical assistance;Maximal assistance;Sit to/from stand Lower Body Dressing Details (indicate cue type and reason): Max A +2 due to decreased balance and  need for BUE support in standing. Toilet Transfer: Moderate assistance;+2 for physical assistance;+2 for safety/equipment;RW;Stand-pivot Toilet Transfer Details (indicate cue type and reason): simulated to recliner Toileting- Clothing Manipulation and Hygiene: Maximal assistance;+2 for physical assistance;Sit to/from stand Toileting - Clothing Manipulation Details (indicate cue type and reason): Max A +2 for balance and assist with limited ROM  Tub/ Shower Transfer:  (deffered) Tub/Shower Transfer Details (indicate cue type and reason): deferred due to safety Functional mobility during ADLs: Moderate assistance;+2 for physical assistance;Rolling walker General ADL Comments: Set up - Max A +2 for ADLs     Vision Baseline Vision/History: Wears glasses Wears Glasses: At all times Patient Visual Report: No change from baseline              Pertinent Vitals/Pain Pain Assessment: Faces Faces Pain Scale: Hurts even more Pain Location: R shoulder Pain Descriptors / Indicators: Discomfort;Guarding;Grimacing;Shooting Pain Intervention(s): Limited activity within patient's tolerance;Monitored during session;Repositioned     Hand Dominance Right   Extremity/Trunk Assessment Upper Extremity Assessment Upper Extremity Assessment: RUE deficits/detail;LUE deficits/detail RUE Deficits / Details: limited ROM in R shoulder flexion (2+/5), Elbow flexion (3/5), Wrist flexion/ext (2/5) and grip strength (1/5). Flacidity felt in fingers and arm. Able to perform PROM with fingers without pain RUE: Unable to fully assess due to pain RUE Sensation: decreased light touch RUE Coordination: decreased fine motor;decreased gross motor LUE Deficits / Details: Hx of limited ROM in L shoulder   Lower Extremity Assessment Lower Extremity Assessment: Defer to PT evaluation   Cervical / Trunk Assessment Cervical / Trunk Assessment: Other exceptions Cervical / Trunk Exceptions: Forward head posture with rounded  shoulders. Obese   Communication Communication Communication: No difficulties   Cognition Arousal/Alertness: Awake/alert Behavior During Therapy: WFL for tasks assessed/performed Overall Cognitive Status: Within Functional Limits for tasks assessed                                 General Comments: A&Ox4 - plesant with good humor and awareness   General Comments  Pt reports he daughter has been living with her and helping her with IADLs and confirmed DME            Home Living Family/patient expects to be discharged to:: Private residence Living Arrangements: Alone Available Help at Discharge: Family;Available 24 hours/day Type of Home: Apartment Home Access: Level entry     Home Layout: One level     Bathroom Shower/Tub: Occupational psychologist: Standard     Home Equipment: Grab bars - tub/shower;Walker - 4 wheels          Prior Functioning/Environment Level of Independence: Needs assistance  Gait / Transfers Assistance Needed: Using a walker for ambulation ADL's / Homemaking Assistance Needed: Daughter assists with cooking, cleaning. Pt dresses herself with pullover gowns.    Comments: Pt mainly stays at home watching TV and has transportation to doctor's appointments.         OT Problem List: Decreased strength;Decreased range of motion;Impaired balance (sitting and/or standing);Decreased coordination;Decreased safety awareness;Obesity;Pain;Impaired UE functional use;Impaired sensation      OT Treatment/Interventions: Self-care/ADL training;Therapeutic exercise;Neuromuscular education;DME and/or AE instruction;Manual therapy;Therapeutic activities;Patient/family education;Balance training    OT Goals(Current goals can be found in the care  plan section) Acute Rehab OT Goals Patient Stated Goal: None stated during session Time For Goal Achievement: 08/16/19 Potential to Achieve Goals: Good  OT Frequency: Min 2X/week    AM-PAC OT "6  Clicks" Daily Activity     Outcome Measure Help from another person eating meals?: A Little Help from another person taking care of personal grooming?: A Little Help from another person toileting, which includes using toliet, bedpan, or urinal?: A Lot Help from another person bathing (including washing, rinsing, drying)?: A Lot Help from another person to put on and taking off regular upper body clothing?: A Lot Help from another person to put on and taking off regular lower body clothing?: A Lot 6 Click Score: 14   End of Session Equipment Utilized During Treatment: Gait belt;Oxygen Nurse Communication: Mobility status  Activity Tolerance: Patient tolerated treatment well Patient left: in chair;with call bell/phone within reach;Other (comment) (PT in room)  OT Visit Diagnosis: Unsteadiness on feet (R26.81);Muscle weakness (generalized) (M62.81);Other symptoms and signs involving the nervous system (R29.898);Pain Pain - Right/Left: Right Pain - part of body: Shoulder                Time: 1031-5945 OT Time Calculation (min): 27 min Charges:     OT General Charges $OT Visit: 1 Visit OT Evaluation $OT Eval Moderate Complexity: 1 Mod OT Treatments $Self Care/Home Management : 8-22 mins  Blaiden Werth/OTS  Aqueelah Cotrell 08/02/2019, 12:26 PM

## 2019-08-02 NOTE — Progress Notes (Signed)
  Echocardiogram 2D Echocardiogram has been performed.  Bobbye Charleston 08/02/2019, 3:08 PM

## 2019-08-02 NOTE — Evaluation (Signed)
Speech Language Pathology Evaluation Patient Details Name: REDONNA WILBERT MRN: [id number] DOB: [birth year] Today's Date: 08/02/2019 Time: 8315-1761 SLP Time Calculation (min) (ACUTE ONLY): 22 min  Problem List:  Patient Active Problem List   Diagnosis Date Noted  . CVA (cerebral vascular accident) ([place]) 08/01/2019  . Pain due to onychomycosis of toenails of both feet 08/31/2018  . Oxygen dependent   . Fever   . Decreased range of motion of left shoulder   . Drug-induced neutropenia ([place])   . Other pancytopenia ([place])   . Methotrexate toxicity   . 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 Acute diverticulitis   . Ventral hernia without obstruction or gangrene   . Cellulitis of leg, left   . Morbid obesity ([place])   . Diverticulitis 06/18/2017  . 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 S/P revision of total knee 10/24/2015   Past Medical History:  Past Medical History:  Diagnosis Date  . Anemia    takes Ferrous Sulfate daily  . Arthritis   . Chronic bronchitis ([place])    Combivent if needed)  . Colostomy care [facility]) 2004  . Coronary artery disease   . Diverticular disease   . Dizziness   . Dry eyes    uses Restasis daily  . H/O hiatal hernia   . History of blood transfusion    no abnormal reaction noted  . History of colon polyps    benign  . Hyperlipidemia    takes Zocor daily  . Hypertension    takes Metoprolol daily  . Hypothyroidism    takes Synthroid daily  . Nocturia    states only d/t being on Lasix  . Numbness and tingling    left foot  . Peripheral edema    takes Lasix daily  . Rheumatoid arthritis(714.0)    Methotrexate 2 times a week  . Shortness of breath    with exertion   Past Surgical History:  Past Surgical History:  Procedure Laterality Date  . ABDOMINAL HYSTERECTOMY  1965  . CAPSULOTOMY Bilateral 06/01/2012   Procedure: MINOR CAPSULOTOMY;  Surgeon: Myrtha Mantis., MD;  Location: [facility];  Service: Ophthalmology;  Laterality: Bilateral;  .  CATARACT EXTRACTION W/PHACO  01/01/2011   Procedure: CATARACT EXTRACTION PHACO AND INTRAOCULAR LENS PLACEMENT (IOC);  Surgeon: [name], MD;  Location: [facility];  Service: Ophthalmology;  Laterality: Left;  . CATARACT EXTRACTION W/PHACO  12/24/2011   Procedure: CATARACT EXTRACTION PHACO AND INTRAOCULAR LENS PLACEMENT (IOC);  Surgeon: [name], MD;  Location: [facility];  Service: Ophthalmology;  Laterality: Right;  . COLON SURGERY  2002   d/t diverticulosis  . COLONOSCOPY    . COLOSTOMY    . ESOPHAGOGASTRODUODENOSCOPY    . EYE SURGERY    . KNEE ARTHROSCOPY     bilateral  . TONSILLECTOMY    . TOTAL KNEE ARTHROPLASTY  2005/2008   bilateral  . TOTAL KNEE REVISION Right 10/24/2015   Procedure: TOTAL KNEE REVISION;  Surgeon: [name], MD;  Location: [facility];  Service: Orthopedics;  Laterality: Right;  . YAG LASER APPLICATION Bilateral 6/0/7371   Procedure: YAG LASER APPLICATION;  Surgeon: Myrtha Mantis., MD;  Location: [facility];  Service: Ophthalmology;  Laterality: Bilateral;   HPI:  74 y.o. female presenting with right hand weakness. PMH is significant for hyperlipidemia, hypertension, anemia, and rheumatoid arthritis. MRI: Small acute cortical/subcortical infarct affecting the left pre- and post-central gyri; subcentimeter acute infarct within the right cerebellum. Pt lives in an  apartment with her daughter. She enjoys watching tv and does so from her bed most of the day, eating her meals in bed as well.    Assessment / Plan / Recommendation Clinical Impression  Pt friendly, yet sleepy and continued to doze off during assessment, negatively impacting performance on the [facility] Status Exam.  She scored an 11/30, with strengths in orientation, word recall, and divergent naming, and points lost for attention, paragraph recall, and clock drawing (impacted by drowsiness).  Speech was clear and fluent.  Expressive and receptive language WNL.  Recommend acute care SLP f/u;  pt will benefit from further therapeutic assessment when she is more wakeful/participatory.      SLP Assessment  SLP Recommendation/Assessment: Patient needs continued Speech Lanaguage Pathology Services SLP Visit Diagnosis: Cognitive communication deficit (R41.841)    Follow Up Recommendations  Skilled Nursing facility    Frequency and Duration min 1 x/week  1 week      SLP Evaluation Cognition  Overall Cognitive Status: Difficult to assess Arousal/Alertness: Awake/alert Orientation Level: Oriented to person;Oriented to place;Oriented to time Attention: Selective Selective Attention: Impaired Selective Attention Impairment: Verbal basic Memory: Impaired Memory Impairment: Storage deficit;Retrieval deficit Awareness: Impaired Awareness Impairment: Intellectual impairment Problem Solving: Appears intact       Comprehension  Auditory Comprehension Overall Auditory Comprehension: Appears within functional limits for tasks assessed Reading Comprehension Reading Status: Not tested    Expression Expression Primary Mode of Expression: Verbal Verbal Expression Overall Verbal Expression: Appears within functional limits for tasks assessed Written Expression Dominant Hand: Right Written Expression: Not tested   Oral / Motor  Oral Motor/Sensory Function Overall Oral Motor/Sensory Function: Within functional limits Motor Speech Overall Motor Speech: Appears within functional limits for tasks assessed   GO                    Juan Quam Laurice 08/02/2019, 1:47 PM Cornie Mccomber L. Tivis Ringer, [place] Office number [phone] Pager [phone]

## 2019-08-03 DIAGNOSIS — I059 Rheumatic mitral valve disease, unspecified: Secondary | ICD-10-CM

## 2019-08-03 LAB — BASIC METABOLIC PANEL
Anion gap: 9 (ref 5–15)
BUN: 23 mg/dL (ref 8–23)
CO2: 23 mmol/L (ref 22–32)
Calcium: 8.9 mg/dL (ref 8.9–10.3)
Chloride: 108 mmol/L (ref 98–111)
Creatinine, Ser: 1.3 mg/dL — ABNORMAL HIGH (ref 0.44–1.00)
GFR calc Af Amer: 47 mL/min — ABNORMAL LOW (ref >60)
GFR calc non Af Amer: 41 mL/min — ABNORMAL LOW (ref >60)
Glucose, Bld: 95 mg/dL (ref 70–99)
Potassium: 4.6 mmol/L (ref 3.5–5.1)
Sodium: 140 mmol/L (ref 135–145)

## 2019-08-03 MED ORDER — SODIUM CHLORIDE 0.9 % IV SOLN: INTRAVENOUS | Status: DC

## 2019-08-03 NOTE — [place] FL2 (Signed)
[place] MEDICAID FL2 LEVEL OF CARE SCREENING TOOL     IDENTIFICATION  Patient Name: ASAMI LAMBRIGHT Birthdate: 1945/05/18 Sex: female Admission Date (Current Location): 08/01/2019  [facility] and [place] Number:  Herbalist and Address:  [facility] [place], [place] [address] [place], [place] [address]      Provider Number: [id number]  Attending Physician Name and Address:  Zenia Resides, MD  Relative Name and Phone Number:       Current Level of Care: Hospital Recommended Level of Care: [place] Prior Approval Number:    Date Approved/Denied:   PASRR Number: [id number]  Discharge Plan: SNF    Current Diagnoses: Patient Active Problem List   Diagnosis Date Noted  . Hypertension, essential   . CVA (cerebral vascular accident) ([place]) 08/01/2019  . Pain due to onychomycosis of toenails of both feet 08/31/2018  . Oxygen dependent   . Fever   . Decreased range of motion of left shoulder   . Drug-induced neutropenia ([place])   . Other pancytopenia ([place])   . Methotrexate toxicity   . 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 Acute diverticulitis   . Ventral hernia without obstruction or gangrene   . Cellulitis of leg, left   . Morbid obesity ([place])   . Diverticulitis 06/18/2017  . 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 S/P revision of total knee 10/24/2015    Orientation RESPIRATION BLADDER Height & Weight     Self, Time, Situation, Place  O2 (see d/c summary for oxygen needs) Incontinent Weight: 128.2 kg Height:  _0  (160 cm)  BEHAVIORAL SYMPTOMS/MOOD NEUROLOGICAL BOWEL NUTRITION STATUS      Colostomy Diet (Heart healthy with thin liquids)  AMBULATORY STATUS COMMUNICATION OF NEEDS Skin   Extensive Assist Verbally Normal                       Personal Care Assistance Level of Assistance  Bathing, Feeding, Dressing Bathing Assistance: Maximum assistance Feeding assistance: Limited assistance Dressing Assistance:  Maximum assistance     Functional Limitations Info  Speech, Sight, Hearing Sight Info: Adequate Hearing Info: Adequate Speech Info: Adequate    SPECIAL CARE FACTORS FREQUENCY  PT (By licensed PT), OT (By licensed OT), Speech therapy     PT Frequency: 5x/wk OT Frequency: 5x/wk     Speech Therapy Frequency: 5x/wk      Contractures Contractures Info: Not present    Additional Factors Info  Code Status, Allergies Code Status Info: Full Allergies Info: NKA           Current Medications (08/03/2019):  This is the current hospital active medication list Current Facility-Administered Medications  Medication Dose Route Frequency Provider Last Rate Last Admin  . acetaminophen (TYLENOL) tablet 650 mg  650 mg Oral Q4H PRN Darrelyn Hillock N, DO   650 mg at 08/03/19 0940   Or  . acetaminophen (TYLENOL) 160 MG/5ML solution 650 mg  650 mg Per Tube Q4H PRN Darrelyn Hillock N, DO       Or  . acetaminophen (TYLENOL) suppository 650 mg  650 mg Rectal Q4H PRN [name], DO      . aspirin EC tablet 81 mg  81 mg Oral Daily Beard, Samantha N, DO   81 mg at 08/03/19 0939  . clopidogrel (PLAVIX) tablet 75 mg  75 mg Oral Daily Hammons, Kimberly B, RPH   75 mg at 08/03/19 0939  . enoxaparin (LOVENOX) injection  40 mg  40 mg Subcutaneous Q24H Higinio Plan, Samantha N, DO   40 mg at 08/02/19 2214  . HYDROcodone-acetaminophen (NORCO) 10-325 MG per tablet 1 tablet  1 tablet Oral TID PRN [name], DO   1 tablet at 08/02/19 2215  . hydroxychloroquine (PLAQUENIL) tablet 200 mg  200 mg Oral BID Darrelyn Hillock N, DO   200 mg at 08/03/19 0940  . leflunomide (ARAVA) tablet 10 mg  10 mg Oral Daily Beard, Samantha N, DO   10 mg at 08/03/19 0940  . levothyroxine (SYNTHROID) tablet 50 mcg  50 mcg Oral Daily Darrelyn Hillock N, DO   50 mcg at 08/03/19 0507  . pantoprazole (PROTONIX) EC tablet 20 mg  20 mg Oral Daily Damita Dunnings B, MD   20 mg at 08/03/19 0940  . predniSONE (DELTASONE) tablet 5 mg  5 mg  Oral Daily Beard, Samantha N, DO   5 mg at 08/03/19 0940  . rosuvastatin (CRESTOR) tablet 20 mg  20 mg Oral q1800 Damita Dunnings B, MD   20 mg at 08/02/19 1646     Discharge Medications: Please see discharge summary for a list of discharge medications.  Relevant Imaging Results:  Relevant Lab Results:   Additional Information SS#: [id number]  Pollie Friar, RN

## 2019-08-03 NOTE — TOC Initial Note (Signed)
Transition of Care [facility]) - Initial/Assessment Note    Patient Details  Name: [name] MRN: [id number] Date of Birth: 1945-08-05  Transition of [facility]) CM/SW Contact:    Pollie Friar, RN Phone Number: 08/03/2019, 12:58 PM  Clinical Narrative:                 Recommendations are for SNF. CM met with the patient and she is in agreement. She states she has been to [facility] before and would like to either go there or: [facility]/ [facility]. CM has completed FL2 and faxed her to these facilities. Insurance auth started through [place].  Covid test good through 7/8. TOC following.  Expected Discharge Plan: Skilled Nursing Facility Barriers to Discharge: Continued Medical Work up   Patient Goals and CMS Choice   CMS Medicare.gov Compare Post Acute Care list provided to:: Patient Choice offered to / list presented to : Patient  Expected Discharge Plan and Services Expected Discharge Plan: [place] In-house Referral: Clinical Social Work Discharge Planning Services: CM Consult Post Acute Care Choice: [place] Living arrangements for the past 2 months: Apartment                                      Prior Living Arrangements/Services Living arrangements for the past 2 months: Apartment Lives with:: Self Patient language and need for interpreter reviewed:: Yes Do you feel safe going back to the place where you live?: Yes      Need for Family Participation in Patient Care: Yes (Comment) Care giver support system in place?: No (comment) Current home services: DME (shower seat/ cane/ rollator--has aide services) Criminal Activity/Legal Involvement Pertinent to Current Situation/Hospitalization: No - Comment as needed  Activities of Daily Living      Permission Sought/Granted                  Emotional Assessment Appearance:: Appears stated age Attitude/Demeanor/Rapport: Engaged Affect (typically observed):  Accepting Orientation: : Oriented to Self, Oriented to Place, Oriented to  Time, Oriented to Situation   Psych Involvement: No (comment)  Admission diagnosis:  CVA (cerebral vascular accident) [facility]) [I63.9] Cerebrovascular accident (CVA) due to other mechanism [facility]) [I63.89] Patient Active Problem List   Diagnosis Date Noted  . Hypertension, essential   . CVA (cerebral vascular accident) ([place]) 08/01/2019  . Pain due to onychomycosis of toenails of both feet 08/31/2018  . Oxygen dependent   . Fever   . Decreased range of motion of left shoulder   . Drug-induced neutropenia ([place])   . Other pancytopenia ([place])   . Methotrexate toxicity   . 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 Acute diverticulitis   . Ventral hernia without obstruction or gangrene   . Cellulitis of leg, left   . Morbid obesity ([place])   . Diverticulitis 06/18/2017  . 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 S/P revision of total knee 10/24/2015   PCP:  Dixie Dials, MD Pharmacy:   [facility], [place] [address] Phone: [phone] Fax: [place], [place] [address] [place] [address] Phone: [phone] Fax: [phone]     Social Determinants of Health (SDOH) Interventions    Readmission Risk Interventions No flowsheet data found.

## 2019-08-03 NOTE — H&P (View-Only) (Signed)
STROKE TEAM PROGRESS NOTE   INTERVAL HISTORY Patient is sitting up in bed.  She states she is doing well.  2D echo shows normal ejection fraction with a mobile echodensity on mitral valve and a TEE is recommended.  Carotid ultrasound shows no significant extracranial stenosis.  Neurological exam is stable.  Vital signs are stable.     Vitals:   08/03/19 0004 08/03/19 0421 08/03/19 0800 08/03/19 1213  BP: (!) 118/56 (!) 143/66 (!) 137/56 (!) 128/59  Pulse: 74 69 74 69  Resp: 18 18 16   Temp: 97.7 F (36.5 C) 97.6 F (36.4 C) 98.1 F (36.7 C) 98.1 F (36.7 C)  TempSrc: Oral Oral Oral Oral  SpO2: 95%  93% 95%  Weight:      Height:        CBC:  Recent Labs  Lab 08/01/19 1349 08/01/19 1358  WBC 8.1  --   NEUTROABS 4.5  --   HGB 11.4* 12.6  HCT 41.0 37.0  MCV 89.5  --   PLT 280  --     Basic Metabolic Panel:  Recent Labs  Lab 08/02/19 0552 08/03/19 0502  NA 141 140  K 4.6 4.6  CL 110 108  CO2 22 23  GLUCOSE 71 95  BUN 19 23  CREATININE 1.16* 1.30*  CALCIUM 8.9 8.9   Lipid Panel:     Component Value Date/Time   CHOL 121 08/02/2019 0552   TRIG 131 08/02/2019 0552   HDL 19 (L) 08/02/2019 0552   CHOLHDL 6.4 08/02/2019 0552   VLDL 26 08/02/2019 0552   LDLCALC 76 08/02/2019 0552   HgbA1c:  Lab Results  Component Value Date   HGBA1C 5.7 (H) 08/02/2019   Urine Drug Screen:     Component Value Date/Time   LABOPIA POSITIVE (A) 08/01/2019 1518   COCAINSCRNUR NONE DETECTED 08/01/2019 1518   LABBENZ NONE DETECTED 08/01/2019 1518   AMPHETMU NONE DETECTED 08/01/2019 1518   THCU NONE DETECTED 08/01/2019 1518   LABBARB NONE DETECTED 08/01/2019 1518    Alcohol Level     Component Value Date/Time   ETH <10 08/01/2019 1349    IMAGING past 24 hours ECHOCARDIOGRAM COMPLETE  Result Date: 08/02/2019    ECHOCARDIOGRAM REPORT   Patient Name:   [name] Date of Exam: 08/02/2019 Medical Rec #:  [id number]    Height:       63.0 in Accession #:    [id number]   Weight:        282.6 lb Date of Birth:  01/07/1946    BSA:          2.240 m Patient Age:    74 years     BP:           139/69 mmHg Patient Gender: F            HR:           87 bpm. Exam Location:  Inpatient Procedure: 2D Echo, Cardiac Doppler and Color Doppler Indications:    Stroke  History:        Patient has prior history of Echocardiogram examinations, most                 recent 07/01/2017. Stroke and TIA; Signs/Symptoms:Dyspnea.  Sonographer:    Tina West RDCS Referring Phys: [id number] WILLIAM A HENSEL  Sonographer Comments: Patient is morbidly obese and Technically difficult study due to poor echo windows. Image acquisition challenging due to patient body habitus. IMPRESSIONS  1. Vigorous   LV systolic function; grade 1 diastolic dysfunction; mild LVH; D shaped septum suggesting pulmonary hypertension; mildly elevated velocity across aortic valve suggesting mild AS but partially explained by vigorous LV function; mild AI; mild RAE; moderate RVE with severe RV dysfunction; moderate TR with severe pulmonary hypertension. There is an oscillating density on MV; cannot R/O vegetation; suggest TEE to further assess.  2. Left ventricular ejection fraction, by estimation, is 60 to 65%. The left ventricle has normal function. The left ventricle has no regional wall motion abnormalities. There is mild left ventricular hypertrophy. Left ventricular diastolic parameters are consistent with Grade I diastolic dysfunction (impaired relaxation).  3. Right ventricular systolic function is severely reduced. The right ventricular size is moderately enlarged. There is severely elevated pulmonary artery systolic pressure.  4. Right atrial size was mildly dilated.  5. The mitral valve is normal in structure. No evidence of mitral valve regurgitation. No evidence of mitral stenosis.  6. Tricuspid valve regurgitation is moderate.  7. The aortic valve is tricuspid. Aortic valve regurgitation is mild. Mild aortic valve stenosis.  8. The inferior vena  cava is normal in size with <50% respiratory variability, suggesting right atrial pressure of 8 mmHg. FINDINGS  Left Ventricle: Left ventricular ejection fraction, by estimation, is 60 to 65%. The left ventricle has normal function. The left ventricle has no regional wall motion abnormalities. The left ventricular internal cavity size was normal in size. There is  mild left ventricular hypertrophy. Left ventricular diastolic parameters are consistent with Grade I diastolic dysfunction (impaired relaxation). Right Ventricle: The right ventricular size is moderately enlarged.Right ventricular systolic function is severely reduced. There is severely elevated pulmonary artery systolic pressure. The tricuspid regurgitant velocity is 4.66 m/s, and with an assumed  right atrial pressure of 8 mmHg, the estimated right ventricular systolic pressure is 94.9 mmHg. Left Atrium: Left atrial size was normal in size. Right Atrium: Right atrial size was mildly dilated. Pericardium: There is no evidence of pericardial effusion. Mitral Valve: The mitral valve is normal in structure. Normal mobility of the mitral valve leaflets. Mild mitral annular calcification. No evidence of mitral valve regurgitation. No evidence of mitral valve stenosis. MV peak gradient, 9.5 mmHg. The mean mitral valve gradient is 2.5 mmHg. Tricuspid Valve: The tricuspid valve is normal in structure. Tricuspid valve regurgitation is moderate . No evidence of tricuspid stenosis. Aortic Valve: The aortic valve is tricuspid. Aortic valve regurgitation is mild. Mild aortic stenosis is present. Aortic valve mean gradient measures 10.5 mmHg. Aortic valve peak gradient measures 22.9 mmHg. Aortic valve area, by VTI measures 1.92 cm. Pulmonic Valve: The pulmonic valve was normal in structure. Pulmonic valve regurgitation is not visualized. No evidence of pulmonic stenosis. Aorta: The aortic root is normal in size and structure. Venous: The inferior vena cava is normal  in size with less than 50% respiratory variability, suggesting right atrial pressure of 8 mmHg. IAS/Shunts: No atrial level shunt detected by color flow Doppler. Additional Comments: Vigorous LV systolic function; grade 1 diastolic dysfunction; mild LVH; D shaped septum suggesting pulmonary hypertension; mildly elevated velocity across aortic valve suggesting mild AS but partially explained by vigorous LV function; mild AI; mild RAE; moderate RVE with severe RV dysfunction; moderate TR with severe pulmonary hypertension. There is an oscillating density on MV; cannot R/O vegetation; suggest TEE to further assess.  LEFT VENTRICLE PLAX 2D LVIDd:         3.59 cm     Diastology LVIDs:           2.02 cm     LV e' lateral:   7.51 cm/s LV PW:         1.34 cm     LV E/e' lateral: 10.8 LV IVS:        1.57 cm     LV e' medial:    5.44 cm/s LVOT diam:     2.30 cm     LV E/e' medial:  14.9 LV SV:         71 LV SV Index:   32 LVOT Area:     4.15 cm  LV Volumes (MOD) LV vol d, MOD A2C: 41.8 ml LV vol d, MOD A4C: 51.6 ml LV vol s, MOD A2C: 9.7 ml LV vol s, MOD A4C: 10.8 ml LV SV MOD A2C:     32.1 ml LV SV MOD A4C:     51.6 ml LV SV MOD BP:      36.1 ml RIGHT VENTRICLE             IVC RV S prime:     11.70 cm/s  IVC diam: 2.04 cm TAPSE (M-mode): 2.0 cm LEFT ATRIUM           Index       RIGHT ATRIUM           Index LA diam:      2.80 cm 1.25 cm/m  RA Area:     19.00 cm LA Vol (A2C): 57.3 ml 25.58 ml/m RA Volume:   59.20 ml  26.43 ml/m LA Vol (A4C): 28.1 ml 12.55 ml/m  AORTIC VALVE AV Area (Vmax):    1.89 cm AV Area (Vmean):   1.96 cm AV Area (VTI):     1.92 cm AV Vmax:           239.50 cm/s AV Vmean:          150.500 cm/s AV VTI:            0.372 m AV Peak Grad:      22.9 mmHg AV Mean Grad:      10.5 mmHg LVOT Vmax:         109.00 cm/s LVOT Vmean:        71.000 cm/s LVOT VTI:          0.172 m LVOT/AV VTI ratio: 0.46  AORTA Ao Root diam: 3.60 cm Ao Asc diam:  3.20 cm MITRAL VALVE                TRICUSPID VALVE MV Area (PHT):  2.45 cm     TR Peak grad:   86.9 mmHg MV Peak grad:  9.5 mmHg     TR Vmax:        466.00 cm/s MV Mean grad:  2.5 mmHg MV Vmax:       1.54 m/s     SHUNTS MV Vmean:      69.4 cm/s    Systemic VTI:  0.17 m MV Decel Time: 310 msec     Systemic Diam: 2.30 cm MV E velocity: 81.00 cm/s MV A velocity: 119.00 cm/s MV E/A ratio:  0.68 Brian Crenshaw MD Electronically signed by Brian Crenshaw MD Signature Date/Time: 08/02/2019/4:07:26 PM    Final     PHYSICAL EXAM Obese elderly African-American lady not in distress. . Afebrile. Head is nontraumatic. Neck is supple without bruit.    Cardiac exam no murmur or gallop. Lungs are clear to auscultation. Distal pulses are well felt. Neurological Exam :  Awake alert oriented   to time place and person.  Speech is normal without dysarthria or aphasia.  Extraocular movements are full range without nystagmus.  Blinks to threat bilaterally.  Mild right lower facial weakness.  Tongue midline.  Motor system exam shows mild right upper extremity drift and weakness distally with diminished fine finger movements and orbits left over right upper extremity.  Mild left finger-to-nose dysmetria.  Sensation is diminished in the right arm and right leg subjectively.  Deep tendon reflexes symmetric.  Gait not tested. ASSESSMENT/PLAN [name] is a 74 y.o. female with history of HTN, HLD, CAD presenting with RUE weakness.   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   CT CS no fx or acute abnormality  MRI  Small cortical/ subcortical L pre+postcentral gyri. Subcentimeter R cerebellar infarct.  MRA  Gross patency, R VA and R PICA not well seen  Carotid Doppler  B ICA 1-39% stenosis, VAs antegrade   2D Echo status post   A [facility] electrophysiologist will consult and consider placement of an implantable loop recorder to evaluate for atrial  fibrillation as etiology of stroke. This has been explained to patient/family by Dr. Lodie Waheed and they are agreeable.   LDL 76  HgbA1c 5.7  Lovenox 40 mg sq daily for VTE prophylaxis  No antithrombotic prior to admission, now on aspirin 81 mg daily and clopidogrel 75 mg daily following plavix load    Therapy recommendations:  SNF  Disposition:  pending   Hypertension  Stable . Permissive hypertension (OK if < 220/120) but gradually normalize in 5-7 days . Long-term BP goal normotensive  Hyperlipidemia  Home meds:  zocor 40  Now on crestor 20  LDL 76, goal < 70  Continue statin at discharge  Other Stroke Risk Factors  Advanced age  Former Cigarette smoker  Morbid Obesity, Body mass index is 50.07 kg/m., recommend weight loss, diet and exercise as appropriate   Coronary artery disease  Other Active Problems  R on weekly methotrexate, plaquenil, lefunomide, prednisone   Chronic pain syndrome  Hypothyroidism  CKD stage II   Hospital day # 2 She presented with right upper extremity weakness secondary to embolic left MCA as well as left cerebellar infarcts etiology to be determined but strong suspicion for paroxysmal A. fib.  Recommend TEE to further evaluate the mobile echodensity on the mitral valve noted on the 2D echo and loop recorder for paroxysmal A. fib upon discharge.  Aspirin and Plavix for 3 weeks followed by aspirin alone.  Patient decided not to participate in the Bayer stroke study.. Greater than 50% time during this 25-minute visit was spent on counseling and coordination of care about her embolic stroke and discussion about stroke prevention, evaluation and treatment and answering questions Emylie Amster, MD   To contact Stroke Continuity provider, please refer to [email or web address]. After hours, contact General Neurology 

## 2019-08-03 NOTE — Progress Notes (Signed)
Family Medicine Teaching Service Daily Progress Note Intern Pager: [phone]  Patient name: [name] Medical record number: [id number] Date of birth: 08/22/1945 Age: 74 y.o. Gender: female  Primary Care Provider: Dixie Dials, MD Consultants: Neurology Code Status: FULL  Pt Overview and Major Events to Date:  08/02/2019- Patient admitted and dx. With acute CVA 08/03/2019- Patient received echo and Carotid Vas [place]  Assessment and Plan: Acute CVA: Stable. Ms. Carboni displayed increase use in her right hand. She was able to extend the fingers in the hand and displayed increase active ROM in the right wrist. Patient did not have pain with active extension of the right hand.Cranial nerves remain intact with slight facial droop on right side, see physical exam below. CT and MRI imaging in ED was limited by motion artifact, howeverMR brain w/ocontrast showed small acute cortical/subcortical infarct affecting the left pre and postcentral gyri, additional subcentimeter acute infarct within the right cerebellum. Patient received Carotid US which did not show significant abnormalities. Patient received echo, EF 60-65% with mild left ventricular hypertrophy, right ventricle is moderately enlarged, severely elevated PA systolic pressure, RA was mildly dilated, and mild aortic regurgitation. Echo was unable to r/o vegetation and it is suggested TEE to further assess.  No known history of A fib/flutter and not present on EKG. Neurology recommends a loop recorder prior to discharge. RF including age, HLD, HTN, and RA. Clopidogrel 300 mg p.o. once received in ED 7/5. Patient BP sys range from 118-143. Patient breathing comfortably on 2L nasal cannula. PT/OT recommend that patient go to a SNF at discharge. It has been discussed with patient that she has been recommended to be discharge to a SNF. Patient has also been made aware of her pending TEE and she is aware that she will be put to sleep for this procedure.   --Monitor telemetry  --A1c at admission is 5.7 A1c --ASA + plavix for 3 weeks followed by ASA alone --Continue Crestor -- Monitor patient on pulse ox -- Ambulate with pulse ox -- monitor O2 requirements --Will likely need cardiac monitor or loop recorder given embolic concern  --PT/OT/ ST -Frequent neuro checks -Fall precautions, up with assistance -Monitor vitals, BP sys range 118-143 -TEE, likely 7/7 - Loop recorder prior to discharge - Consult SW for SNF  Hypertension: Chronic, stable. Takes metoprolol 54m BID at home. - Continue to hold metoprolol - Sys BP 114-143  CKD Stage II: Stable. Creatinine 1.54on admit. Will continue to monitor to assess if patient has had progression. Cr 1.3.  HgbA1c at this admission is 5.7. --Monitor BMP in am  Rheumatoid arthritis: Chronic, stable. Takes Etanercept weekly (on fridays), plaquenil, and leflunomide for control. Additionally on chronic prednisone 52m Follows with rheumatology outpatient. -Continue home meds - Start Protonix 2035m Hyperlipidemia: Chronic, stable. -May consider transition to Crestor [id number] home simvastatin for high intensity secondary prevention - Continue Crestor   Chronic pain syndrome: Stable. UDS positive only for opiates.Takes norco PRN at home, PMP aware reviewed and appropriate. #90 filled on 6/14.  --Continue home regimen  Hypothyroidism: Chronic, stable. -Continue home levothyroxine 50 mcg daily.      FEN/GI: Full diet PPx: Lovenox  Disposition: Inpatient  Subjective:  Patient reports she is doing okay. She does not have any new reports of pain and did not have any overnight events. The patient did receive her Echo and was recommended to have a TEE. Patient carotid vas US d[place] not abnormalities. Patient reports that she has a hx  of smoking. She started before the age of 13 and smoked until 2004.   Patient has been visited by [facility] for her ostomy bag  care.  Objective: Temp:  [97.6 F (36.4 C)-99.2 F (37.3 C)] 97.6 F (36.4 C) (07/07 0421) Pulse Rate:  [69-88] 69 (07/07 0421) Resp:  [18] 18 (07/07 0421) BP: (118-143)/(54-69) 143/66 (07/07 0421) SpO2:  [95 %-100 %] 95 % (07/07 0004) Physical Exam: General: Resting comfortably in bed. Cardiovascular: Regular rate and rhythm, no murmur detected Respiratory: Bilateral inspiratory wheeze noted in the lower lobes, no extra work of breathing noted Abdomen: No pain to palpation, WOC ostomy bag noted Extremities: Bilateral extremities non- pitting edema, pain to palpation along the shin (chronic)  MSK: Continues to have swelling with no erythema noted in the R wrist, no pain to palpation of the r wrisrt, Active ROM equal in bilateral wrist, no pain active extension of R fingers, 3/5 r hand grip strength, 4/5 l grip strength, able to flex and extend both R/L elbow, able to abduct and adduct R/L shoulders  Laboratory: Recent Labs  Lab 08/01/19 1349 08/01/19 1358  WBC 8.1  --   HGB 11.4* 12.6  HCT 41.0 37.0  PLT 280  --    Recent Labs  Lab 08/01/19 1349 08/01/19 1349 08/01/19 1358 08/02/19 0552 08/03/19 0502  NA 141   < > 144 141 140  K 4.3   < > 4.3 4.6 4.6  CL 109   < > 107 110 108  CO2 20*  --   --  22 23  BUN 22   < > 24* 19 23  CREATININE 1.54*   < > 1.40* 1.16* 1.30*  CALCIUM 9.3  --   --  8.9 8.9  PROT 6.9  --   --   --   --   BILITOT 1.0  --   --   --   --   ALKPHOS 47  --   --   --   --   ALT 13  --   --   --   --   AST 29  --   --   --   --   GLUCOSE 95   < > 95 71 95   < > = values in this interval not displayed.      Imaging/Diagnostic Tests: 7/7- VAS US Carotid Duplex Bil Summary:  Right Carotid: The extracranial vessels were near-normal with only minimal  wall         thickening or plaque.   Left Carotid: The extracranial vessels were near-normal with only minimal  wall        thickening or plaque.   Vertebrals: Bilateral  vertebral arteries demonstrate antegrade flow.  Subclavians: Normal flow hemodynamics were seen in bilateral subclavian        arteries.  7/7- Echo Left Ventricle: Left ventricular ejection fraction, by estimation, is 60  to 65%. The left ventricle has normal function. The left ventricle has no  regional wall motion abnormalities. The left ventricular internal cavity  size was normal in size. There is  mild left ventricular hypertrophy. Left ventricular diastolic parameters  are consistent with Grade I diastolic dysfunction (impaired relaxation).   Right Ventricle: The right ventricular size is moderately enlarged.Right  ventricular systolic function is severely reduced. There is severely  elevated pulmonary artery systolic pressure. The tricuspid regurgitant  velocity is 4.66 m/s, and with an assumed  right atrial pressure of 8 mmHg, the estimated right ventricular  systolic  pressure is 38.3 mmHg.   Left Atrium: Left atrial size was normal in size.   Right Atrium: Right atrial size was mildly dilated.   Pericardium: There is no evidence of pericardial effusion.   Mitral Valve: The mitral valve is normal in structure. Normal mobility of  the mitral valve leaflets. Mild mitral annular calcification. No evidence  of mitral valve regurgitation. No evidence of mitral valve stenosis. MV  peak gradient, 9.5 mmHg. The mean  mitral valve gradient is 2.5 mmHg.   Tricuspid Valve: The tricuspid valve is normal in structure. Tricuspid  valve regurgitation is moderate . No evidence of tricuspid stenosis.   Aortic Valve: The aortic valve is tricuspid. Aortic valve regurgitation is  mild. Mild aortic stenosis is present. Aortic valve mean gradient measures  10.5 mmHg. Aortic valve peak gradient measures 22.9 mmHg. Aortic valve  area, by VTI measures 1.92 cm.   Pulmonic Valve: The pulmonic valve was normal in structure. Pulmonic valve  regurgitation is not visualized. No evidence of  pulmonic stenosis.   Aorta: The aortic root is normal in size and structure.   Venous: The inferior vena cava is normal in size with less than 50%  respiratory variability, suggesting right atrial pressure of 8 mmHg.   IAS/Shunts: No atrial level shunt detected by color flow Doppler.   Additional Comments: Vigorous LV systolic function; grade 1 diastolic  dysfunction; mild LVH; D shaped septum suggesting pulmonary hypertension;  mildly elevated velocity across aortic valve suggesting mild AS but  partially explained by vigorous LV  function; mild AI; mild RAE; moderate RVE with severe RV dysfunction;  moderate TR with severe pulmonary hypertension. There is an oscillating  density on MV; cannot R/O vegetation; suggest TEE to further assess.  Freida Busman, MD 08/03/2019, 7:25 AM PGY-1, [place] Intern pager: [phone], text pages welcome

## 2019-08-03 NOTE — Progress Notes (Signed)
STROKE TEAM PROGRESS NOTE   INTERVAL HISTORY Patient is sitting up in bed.  She states she is doing well.  2D echo shows normal ejection fraction with a mobile echodensity on mitral valve and a TEE is recommended.  Carotid ultrasound shows no significant extracranial stenosis.  Neurological exam is stable.  Vital signs are stable.     Vitals:   08/03/19 0004 08/03/19 0421 08/03/19 0800 08/03/19 1213  BP: (!) 118/56 (!) 143/66 (!) 137/56 (!) 128/59  Pulse: 74 69 74 69  Resp: _0 Temp: 97.7 F (36.5 C) 97.6 F (36.4 C) 98.1 F (36.7 C) 98.1 F (36.7 C)  TempSrc: Oral Oral Oral Oral  SpO2: 95%  93% 95%  Weight:      Height:        CBC:  Recent Labs  Lab 08/01/19 1349 08/01/19 1358  WBC 8.1  --   NEUTROABS 4.5  --   HGB 11.4* 12.6  HCT 41.0 37.0  MCV 89.5  --   PLT 280  --     Basic Metabolic Panel:  Recent Labs  Lab 08/02/19 0552 08/03/19 0502  NA 141 140  K 4.6 4.6  CL 110 108  CO2 22 23  GLUCOSE 71 95  BUN 19 23  CREATININE 1.16* 1.30*  CALCIUM 8.9 8.9   Lipid Panel:     Component Value Date/Time   CHOL 121 08/02/2019 0552   TRIG 131 08/02/2019 0552   HDL 19 (L) 08/02/2019 0552   CHOLHDL 6.4 08/02/2019 0552   VLDL 26 08/02/2019 0552   LDLCALC 76 08/02/2019 0552   HgbA1c:  Lab Results  Component Value Date   HGBA1C 5.7 (H) 08/02/2019   Urine Drug Screen:     Component Value Date/Time   LABOPIA POSITIVE (A) 08/01/2019 1518   COCAINSCRNUR NONE DETECTED 08/01/2019 1518   LABBENZ NONE DETECTED 08/01/2019 1518   AMPHETMU NONE DETECTED 08/01/2019 1518   THCU NONE DETECTED 08/01/2019 1518   LABBARB NONE DETECTED 08/01/2019 1518    Alcohol Level     Component Value Date/Time   ETH <10 08/01/2019 1349    IMAGING past 24 hours ECHOCARDIOGRAM COMPLETE  Result Date: 08/02/2019    ECHOCARDIOGRAM REPORT   Patient Name:   [name] Date of Exam: 08/02/2019 Medical Rec #:  [id number]    Height:       63.0 in Accession #:    [id number]   Weight:        282.6 lb Date of Birth:  November 10, 1945    BSA:          2.240 m Patient Age:    74 years     BP:           139/69 mmHg Patient Gender: F            HR:           87 bpm. Exam Location:  Inpatient Procedure: 2D Echo, Cardiac Doppler and Color Doppler Indications:    Stroke  History:        Patient has prior history of Echocardiogram examinations, most                 recent 07/01/2017. Stroke and TIA; Signs/Symptoms:Dyspnea.  Sonographer:    Roseanna Rainbow RDCS Referring Phys: Francis Dowse Gwyndolyn Saxon A HENSEL  Sonographer Comments: Patient is morbidly obese and Technically difficult study due to poor echo windows. Image acquisition challenging due to patient body habitus. IMPRESSIONS  1. Vigorous  LV systolic function; grade 1 diastolic dysfunction; mild LVH; D shaped septum suggesting pulmonary hypertension; mildly elevated velocity across aortic valve suggesting mild AS but partially explained by vigorous LV function; mild AI; mild RAE; moderate RVE with severe RV dysfunction; moderate TR with severe pulmonary hypertension. There is an oscillating density on MV; cannot R/O vegetation; suggest TEE to further assess.  2. Left ventricular ejection fraction, by estimation, is 60 to 65%. The left ventricle has normal function. The left ventricle has no regional wall motion abnormalities. There is mild left ventricular hypertrophy. Left ventricular diastolic parameters are consistent with Grade I diastolic dysfunction (impaired relaxation).  3. Right ventricular systolic function is severely reduced. The right ventricular size is moderately enlarged. There is severely elevated pulmonary artery systolic pressure.  4. Right atrial size was mildly dilated.  5. The mitral valve is normal in structure. No evidence of mitral valve regurgitation. No evidence of mitral stenosis.  6. Tricuspid valve regurgitation is moderate.  7. The aortic valve is tricuspid. Aortic valve regurgitation is mild. Mild aortic valve stenosis.  8. The inferior vena  cava is normal in size with <50% respiratory variability, suggesting right atrial pressure of 8 mmHg. FINDINGS  Left Ventricle: Left ventricular ejection fraction, by estimation, is 60 to 65%. The left ventricle has normal function. The left ventricle has no regional wall motion abnormalities. The left ventricular internal cavity size was normal in size. There is  mild left ventricular hypertrophy. Left ventricular diastolic parameters are consistent with Grade I diastolic dysfunction (impaired relaxation). Right Ventricle: The right ventricular size is moderately enlarged.Right ventricular systolic function is severely reduced. There is severely elevated pulmonary artery systolic pressure. The tricuspid regurgitant velocity is 4.66 m/s, and with an assumed  right atrial pressure of 8 mmHg, the estimated right ventricular systolic pressure is 29.4 mmHg. Left Atrium: Left atrial size was normal in size. Right Atrium: Right atrial size was mildly dilated. Pericardium: There is no evidence of pericardial effusion. Mitral Valve: The mitral valve is normal in structure. Normal mobility of the mitral valve leaflets. Mild mitral annular calcification. No evidence of mitral valve regurgitation. No evidence of mitral valve stenosis. MV peak gradient, 9.5 mmHg. The mean mitral valve gradient is 2.5 mmHg. Tricuspid Valve: The tricuspid valve is normal in structure. Tricuspid valve regurgitation is moderate . No evidence of tricuspid stenosis. Aortic Valve: The aortic valve is tricuspid. Aortic valve regurgitation is mild. Mild aortic stenosis is present. Aortic valve mean gradient measures 10.5 mmHg. Aortic valve peak gradient measures 22.9 mmHg. Aortic valve area, by VTI measures 1.92 cm. Pulmonic Valve: The pulmonic valve was normal in structure. Pulmonic valve regurgitation is not visualized. No evidence of pulmonic stenosis. Aorta: The aortic root is normal in size and structure. Venous: The inferior vena cava is normal  in size with less than 50% respiratory variability, suggesting right atrial pressure of 8 mmHg. IAS/Shunts: No atrial level shunt detected by color flow Doppler. Additional Comments: Vigorous LV systolic function; grade 1 diastolic dysfunction; mild LVH; D shaped septum suggesting pulmonary hypertension; mildly elevated velocity across aortic valve suggesting mild AS but partially explained by vigorous LV function; mild AI; mild RAE; moderate RVE with severe RV dysfunction; moderate TR with severe pulmonary hypertension. There is an oscillating density on MV; cannot R/O vegetation; suggest TEE to further assess.  LEFT VENTRICLE PLAX 2D LVIDd:         3.59 cm     Diastology LVIDs:  2.02 cm     LV e' lateral:   7.51 cm/s LV PW:         1.34 cm     LV E/e' lateral: 10.8 LV IVS:        1.57 cm     LV e' medial:    5.44 cm/s LVOT diam:     2.30 cm     LV E/e' medial:  14.9 LV SV:         71 LV SV Index:   32 LVOT Area:     4.15 cm  LV Volumes (MOD) LV vol d, MOD A2C: 41.8 ml LV vol d, MOD A4C: 51.6 ml LV vol s, MOD A2C: 9.7 ml LV vol s, MOD A4C: 10.8 ml LV SV MOD A2C:     32.1 ml LV SV MOD A4C:     51.6 ml LV SV MOD BP:      36.1 ml RIGHT VENTRICLE             IVC RV S prime:     11.70 cm/s  IVC diam: 2.04 cm TAPSE (M-mode): 2.0 cm LEFT ATRIUM           Index       RIGHT ATRIUM           Index LA diam:      2.80 cm 1.25 cm/m  RA Area:     19.00 cm LA Vol (A2C): 57.3 ml 25.58 ml/m RA Volume:   59.20 ml  26.43 ml/m LA Vol (A4C): 28.1 ml 12.55 ml/m  AORTIC VALVE AV Area (Vmax):    1.89 cm AV Area (Vmean):   1.96 cm AV Area (VTI):     1.92 cm AV Vmax:           239.50 cm/s AV Vmean:          150.500 cm/s AV VTI:            0.372 m AV Peak Grad:      22.9 mmHg AV Mean Grad:      10.5 mmHg LVOT Vmax:         109.00 cm/s LVOT Vmean:        71.000 cm/s LVOT VTI:          0.172 m LVOT/AV VTI ratio: 0.46  AORTA Ao Root diam: 3.60 cm Ao Asc diam:  3.20 cm MITRAL VALVE                TRICUSPID VALVE MV Area (PHT):  2.45 cm     TR Peak grad:   86.9 mmHg MV Peak grad:  9.5 mmHg     TR Vmax:        466.00 cm/s MV Mean grad:  2.5 mmHg MV Vmax:       1.54 m/s     SHUNTS MV Vmean:      69.4 cm/s    Systemic VTI:  0.17 m MV Decel Time: 310 msec     Systemic Diam: 2.30 cm MV E velocity: 81.00 cm/s MV A velocity: 119.00 cm/s MV E/A ratio:  0.68 Kirk Ruths MD Electronically signed by Kirk Ruths MD Signature Date/Time: 08/02/2019/4:07:26 PM    Final     PHYSICAL EXAM Obese elderly African-American lady not in distress. . Afebrile. Head is nontraumatic. Neck is supple without bruit.    Cardiac exam no murmur or gallop. Lungs are clear to auscultation. Distal pulses are well felt. Neurological Exam :  Awake alert oriented  to time place and person.  Speech is normal without dysarthria or aphasia.  Extraocular movements are full range without nystagmus.  Blinks to threat bilaterally.  Mild right lower facial weakness.  Tongue midline.  Motor system exam shows mild right upper extremity drift and weakness distally with diminished fine finger movements and orbits left over right upper extremity.  Mild left finger-to-nose dysmetria.  Sensation is diminished in the right arm and right leg subjectively.  Deep tendon reflexes symmetric.  Gait not tested. ASSESSMENT/PLAN [name] is a 74 y.o. female with history of HTN, HLD, CAD presenting with RUE weakness.   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   CT CS no fx or acute abnormality  MRI  Small cortical/ subcortical L pre+postcentral gyri. Subcentimeter R cerebellar infarct.  MRA  Gross patency, R VA and R PICA not well seen  Carotid Doppler  B ICA 1-39% stenosis, VAs antegrade   2D Echo status post   [place] electrophysiologist will consult and consider placement of an implantable loop recorder to evaluate for atrial  fibrillation as etiology of stroke. This has been explained to patient/family by Dr. Leonie Man and they are agreeable.   LDL 76  HgbA1c 5.7  Lovenox 40 mg sq daily for VTE prophylaxis  No antithrombotic prior to admission, now on aspirin 81 mg daily and clopidogrel 75 mg daily following plavix load    Therapy recommendations:  SNF  Disposition:  pending   Hypertension  Stable . Permissive hypertension (OK if < 220/120) but gradually normalize in 5-7 days . Long-term BP goal normotensive  Hyperlipidemia  Home meds:  zocor 40  Now on crestor 20  LDL 76, goal < 70  Continue statin at discharge  Other Stroke Risk Factors  Advanced age  Former Cigarette smoker  Morbid Obesity, Body mass index is 50.07 kg/m., recommend weight loss, diet and exercise as appropriate   Coronary artery disease  Other Active Problems  R on weekly methotrexate, plaquenil, lefunomide, prednisone   Chronic pain syndrome  Hypothyroidism  CKD stage II   Hospital day # 2 She presented with right upper extremity weakness secondary to embolic left MCA as well as left cerebellar infarcts etiology to be determined but strong suspicion for paroxysmal A. fib.  Recommend TEE to further evaluate the mobile echodensity on the mitral valve noted on the 2D echo and loop recorder for paroxysmal A. fib upon discharge.  Aspirin and Plavix for 3 weeks followed by aspirin alone.  Patient decided not to participate in the [place] stroke study.. Greater than 50% time during this 25-minute visit was spent on counseling and coordination of care about her embolic stroke and discussion about stroke prevention, evaluation and treatment and answering questions Antony Contras, MD   To contact Stroke Continuity provider, please refer to [email or web address] After hours, contact General Neurology

## 2019-08-03 NOTE — TOC Progression Note (Signed)
Transition of Care [facility]) - Progression Note    Patient Details  Name: SUMMERLYNN GLAUSER MRN: [id number] Date of Birth: 12-22-1945  Transition of Care [facility]) CM/SW Contact  Pollie Friar, RN Phone Number: 08/03/2019, 4:36 PM  Clinical Narrative:    Pt and daughter selected [facility]. CM will update [facility] and [facility] on the facility. TOC following.   Expected Discharge Plan: [place] Barriers to Discharge: Continued Medical Work up  Expected Discharge Plan and Services Expected Discharge Plan: [place] In-house Referral: Clinical Social Work Discharge Planning Services: CM Consult Post Acute Care Choice: [place] Living arrangements for the past 2 months: Apartment                                       Social Determinants of Health (SDOH) Interventions    Readmission Risk Interventions No flowsheet data found.

## 2019-08-03 NOTE — Progress Notes (Signed)
   [facility] has been requested to perform a transesophageal echocardiogram on [name] for acute stroke  After careful review of history and examination, the risks and benefits of transesophageal echocardiogram have been explained including risks of esophageal damage, perforation (1:10,000 risk), bleeding, pharyngeal hematoma as well as other potential complications associated with conscious sedation including aspiration, arrhythmia, respiratory failure and death. Alternatives to treatment were discussed, questions were answered. Patient is willing to proceed.   Procedure scheduled for 08/04/2019 at 12:30pm with Dr. Audie Box. Will place orders and make patient NPO at midnight.  Darreld Mclean, PA-C 08/03/2019 4:33 PM

## 2019-08-04 ENCOUNTER — Other Ambulatory Visit: Payer: Self-pay

## 2019-08-04 ENCOUNTER — Encounter (HOSPITAL_COMMUNITY)
Admission: EM | Disposition: A | Discharge status: Skilled Nursing Facility | Payer: Self-pay | Source: Home / Self Care | Attending: Family Medicine

## 2019-08-04 ENCOUNTER — Encounter (HOSPITAL_COMMUNITY): Payer: Self-pay | Admitting: Family Medicine

## 2019-08-04 ENCOUNTER — Inpatient Hospital Stay (HOSPITAL_COMMUNITY): Payer: Medicare Other | Admitting: Certified Registered"

## 2019-08-04 ENCOUNTER — Inpatient Hospital Stay (HOSPITAL_COMMUNITY): Payer: Medicare Other

## 2019-08-04 DIAGNOSIS — I340 Nonrheumatic mitral (valve) insufficiency: Secondary | ICD-10-CM

## 2019-08-04 DIAGNOSIS — I63411 Cerebral infarction due to embolism of right middle cerebral artery: Secondary | ICD-10-CM

## 2019-08-04 HISTORY — PX: TEE WITHOUT CARDIOVERSION: SHX5443

## 2019-08-04 LAB — CBC
HCT: 32.9 % — ABNORMAL LOW (ref 36.0–46.0)
Hemoglobin: 9.4 g/dL — ABNORMAL LOW (ref 12.0–15.0)
MCH: 24.9 pg — ABNORMAL LOW (ref 26.0–34.0)
MCHC: 28.6 g/dL — ABNORMAL LOW (ref 30.0–36.0)
MCV: 87 fL (ref 80.0–100.0)
Platelets: 236 10*3/uL (ref 150–400)
RBC: 3.78 MIL/uL — ABNORMAL LOW (ref 3.87–5.11)
RDW: 15.1 % (ref 11.5–15.5)
WBC: 6 10*3/uL (ref 4.0–10.5)
nRBC: 0 % (ref 0.0–0.2)

## 2019-08-04 LAB — BASIC METABOLIC PANEL
Anion gap: 7 (ref 5–15)
BUN: 20 mg/dL (ref 8–23)
CO2: 25 mmol/L (ref 22–32)
Calcium: 9 mg/dL (ref 8.9–10.3)
Chloride: 110 mmol/L (ref 98–111)
Creatinine, Ser: 1.13 mg/dL — ABNORMAL HIGH (ref 0.44–1.00)
GFR calc Af Amer: 56 mL/min — ABNORMAL LOW (ref >60)
GFR calc non Af Amer: 49 mL/min — ABNORMAL LOW (ref >60)
Glucose, Bld: 91 mg/dL (ref 70–99)
Potassium: 4.6 mmol/L (ref 3.5–5.1)
Sodium: 142 mmol/L (ref 135–145)

## 2019-08-04 SURGERY — ECHOCARDIOGRAM, TRANSESOPHAGEAL
Anesthesia: Monitor Anesthesia Care

## 2019-08-04 MED ORDER — PROPOFOL 500 MG/50ML IV EMUL
INTRAVENOUS | Status: DC | PRN
  Administered 2019-08-04: 100 ug/kg/min via INTRAVENOUS

## 2019-08-04 MED ORDER — LACTATED RINGERS IV SOLN: INTRAVENOUS | Status: DC

## 2019-08-04 MED ORDER — BUTAMBEN-TETRACAINE-BENZOCAINE 2-2-14 % EX AERO
INHALATION_SPRAY | CUTANEOUS | Status: DC | PRN
  Administered 2019-08-04: 2 via TOPICAL

## 2019-08-04 MED ORDER — ALBUTEROL SULFATE (2.5 MG/3ML) 0.083% IN NEBU
2.5000 mg | INHALATION_SOLUTION | Freq: Once | RESPIRATORY_TRACT | Status: AC
  Administered 2019-08-04: 2.5 mg via RESPIRATORY_TRACT

## 2019-08-04 MED ORDER — ALBUTEROL SULFATE (2.5 MG/3ML) 0.083% IN NEBU
INHALATION_SOLUTION | RESPIRATORY_TRACT | Status: AC
  Filled 2019-08-04: qty 3

## 2019-08-04 NOTE — Hospital Course (Addendum)
Patient presented to the ED after weakness and immobility in the right hand.  A1c was 5.7 on admission, Lipid panel was WNL with the exception of low HDL. Pt received CT Head and Neck, MRI Brain and MRA head. MRI brain determined that patient had Small acute cortical/subcortical infarct affecting the left pre- and postcentral gyri. This finding would account for the patient's reported right hand weakness. Additional subcentimeter acute infarct within the right cerebellum. Neurology was consulted and placed the patient on DAPT (ASA + Plavix). Intially patient was kept in permissive HTN and her pressures came down with 48 hrs. We continued to hold the patients metoprolol as the patient was able matain sys <180 and often <160. CVA was likely embolic althougth origin was undetermined. Patient was transitioned from home simvastatin to Crestor post CVA findings. Vascular US Cartoid duplex bilaterally showed no significant extrcranial stenosis. Echo found EF 60-65%, an oscillating density was seen on the mitral valve and vegetation could not be ruled out prompting TEE. Blood cultures have no growth to date decreasing concern for endocardiditis.Patient neuro exam improved from admission and remained stable with patient regaining movement in her right hand. 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 Vascular US of the lower extremities was ordered after PFO finding and found an age indeterminate DVT in the right  peroneal veins. CTA Chest was negative for PE. Patient did have a small right plerual effusion and findings that were nonspecific and differential considerations were pneumoniavs interstial disease vs less likely assymetric pulmonary edema.  Patient remained on 2L of oxygen most of her hospitalization and O2 sat remained stable.  However patient desated to 80 of of room air. Patient  will discuss loop recorder with neurology on follow-up.     Home medication for patient's chronic pain syndrome, RA, and hypothyroidism were treated with her home medication.  Patient was dischared to a SNF at the recommendation of PT and OT.    Follow-up PCP 2.  Cardiology:f/u to address loop recorder 3.  Neurology: f/u post- CVA

## 2019-08-04 NOTE — Transfer of Care (Signed)
Immediate Anesthesia Transfer of Care Note  Patient: [name]  Procedure(s) Performed: TRANSESOPHAGEAL ECHOCARDIOGRAM (TEE) (N/A )  Patient Location: Endoscopy Unit  Anesthesia Type:MAC  Level of Consciousness: awake, alert  and oriented  Airway & Oxygen Therapy: Patient Spontanous Breathing and Patient connected to nasal cannula oxygen  Post-op Assessment: Report given to RN, Post -op Vital signs reviewed and stable and Patient moving all extremities  Post vital signs: Reviewed and stable  Last Vitals:  Vitals Value Taken Time  BP    Temp    Pulse    Resp    SpO2      Last Pain:  Vitals:   08/04/19 1211  TempSrc: Oral  PainSc: 0-No pain      Patients Stated Pain Goal: 0 (95/79/00 9200)  Complications: No complications documented.

## 2019-08-04 NOTE — Interval H&P Note (Signed)
History and Physical Interval Note:  08/04/2019 12:11 PM  [name]  has presented today for surgery, with the diagnosis of stroke.  The various methods of treatment have been discussed with the patient and family. After consideration of risks, benefits and other options for treatment, the patient has consented to  Procedure(s): TRANSESOPHAGEAL ECHOCARDIOGRAM (TEE) (N/A) as a surgical intervention.  The patient's history has been reviewed, patient examined, no change in status, stable for surgery.  I have reviewed the patient's chart and labs.  Questions were answered to the patient's satisfaction.     TEE for MV mass. Stroke. PHTN noted. NPO.   [place] T. Audie Box, [place]  [address] [place], [place] [address] [phone]  12:12 PM

## 2019-08-04 NOTE — Anesthesia Preprocedure Evaluation (Addendum)
Anesthesia Evaluation  Patient identified by MRN, date of birth, ID band Patient awake    Reviewed: Allergy & Precautions, H&P , NPO status , Patient's Chart, lab work & pertinent test results  Airway Mallampati: II  TM Distance: >3 FB Neck ROM: Full    Dental no notable dental hx. (+) Teeth Intact, Dental Advisory Given   Pulmonary neg pulmonary ROS, former smoker,    Pulmonary exam normal  + wheezing      Cardiovascular hypertension, Pt. on medications + CAD   Rhythm:Regular Rate:Normal + Systolic murmurs    Neuro/Psych CVA negative psych ROS   GI/Hepatic Neg liver ROS, hiatal hernia,   Endo/Other  Hypothyroidism Morbid obesity  Renal/GU negative Renal ROS  negative genitourinary   Musculoskeletal  (+) Arthritis , Rheumatoid disorders,    Abdominal   Peds  Hematology  (+) Blood dyscrasia, anemia ,   Anesthesia Other Findings   Reproductive/Obstetrics negative OB ROS                            Anesthesia Physical Anesthesia Plan  ASA: III  Anesthesia Plan: MAC   Post-op Pain Management:    Induction: Intravenous  PONV Risk Score and Plan: 2 and Propofol infusion and Treatment may vary due to age or medical condition  Airway Management Planned: Nasal Cannula  Additional Equipment:   Intra-op Plan:   Post-operative Plan:   Informed Consent: I have reviewed the patients History and Physical, chart, labs and discussed the procedure including the risks, benefits and alternatives for the proposed anesthesia with the patient or authorized representative who has indicated his/her understanding and acceptance.     Dental advisory given  Plan Discussed with: CRNA  Anesthesia Plan Comments:         Anesthesia Quick Evaluation

## 2019-08-04 NOTE — Progress Notes (Signed)
  Echocardiogram Echocardiogram Transesophageal has been performed.  [name] 08/04/2019, 1:33 PM

## 2019-08-04 NOTE — Progress Notes (Signed)
Physical Therapy Treatment Patient Details Name: [name] MRN: [id number] DOB: December 23, 1945 Today's Date: 08/04/2019    History of Present Illness Pt is a 74 y/o female who presents with RUE weakness and decreased sensation. MRI revealed cerebellar and cortical/subcortical infarcts effecting the pre and post central gyri.  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     PT Comments    Pt progressing towards physical therapy goals. Was able to sit EOB for therapeutic exercise and completion of ADL task. Intermittent assist required for sitting balance (posterior lean noted occasionally). RUE appears improved with AROM since evaluation, however pt complaining of "popping" when attempting to move her shoulder. I noted audible clicking/popping in shoulder with ROM activity. At the time of session I did not note any subluxation however explained the role of her rotator cuff and supporting musculature of her shoulder which is now weak, and educated on need to keep the RUE supported while resting. Will continue to follow and progress as able per POC.    Follow Up Recommendations  SNF;Supervision/Assistance - 24 hour     Equipment Recommendations  None recommended by PT    Recommendations for Other Services       Precautions / Restrictions Precautions Precautions: Fall Restrictions Weight Bearing Restrictions: No    Mobility  Bed Mobility Overal bed mobility: Needs Assistance Bed Mobility: Supine to Sit;Sit to Supine     Supine to sit: Min assist Sit to supine: Min assist   General bed mobility comments: Assist for transition to R side of bed. HHA provided for pt to pull on while scooting L hip out. Unable to reach across torso to grab bed rail due to body habitus. Increased time required. Upon return to supine, min assist provided for LE elevation back up into the bed.   Transfers                 General transfer comment: Did  not progress OOB mobility at this time due to safety as there was not +2 available.   Ambulation/Gait                 Stairs             Wheelchair Mobility    Modified Rankin (Stroke Patients Only) Modified Rankin (Stroke Patients Only) Pre-Morbid Rankin Score: Moderate disability Modified Rankin: Moderately severe disability     Balance Overall balance assessment: Needs assistance Sitting-balance support: Feet supported;No upper extremity supported Sitting balance-Leahy Scale: Fair Sitting balance - Comments: Able to sit without support but noted posterior lean with LE exercise                                    Cognition Arousal/Alertness: Awake/alert Behavior During Therapy: WFL for tasks assessed/performed Overall Cognitive Status: Within Functional Limits for tasks assessed                                        Exercises General Exercises - Upper Extremity Shoulder Flexion: AAROM;Right;10 reps Elbow Flexion: AROM;10 reps;Right Elbow Extension: AROM;10 reps;Right Wrist Flexion: AROM;10 reps;Right Wrist Extension: AROM;10 reps;Right General Exercises - Lower Extremity Long Arc Quad: 10 reps;Seated    General Comments        Pertinent Vitals/Pain Pain Assessment: No/denies pain Pain Intervention(s): Monitored  during session    Home Living   Living Arrangements: Children                  Prior Function            PT Goals (current goals can now be found in the care plan section) Acute Rehab PT Goals Patient Stated Goal: None stated during session PT Goal Formulation: With patient Time For Goal Achievement: 08/16/19 Potential to Achieve Goals: Good Progress towards PT goals: Progressing toward goals    Frequency    Min 3X/week      PT Plan Current plan remains appropriate    Co-evaluation              AM-PAC PT "6 Clicks" Mobility   Outcome Measure  Help needed turning from your  back to your side while in a flat bed without using bedrails?: A Little Help needed moving from lying on your back to sitting on the side of a flat bed without using bedrails?: A Little Help needed moving to and from a bed to a chair (including a wheelchair)?: A Lot Help needed standing up from a chair using your arms (e.g., wheelchair or bedside chair)?: A Lot Help needed to walk in hospital room?: A Lot Help needed climbing 3-5 steps with a railing? : Total 6 Click Score: 13    End of Session Equipment Utilized During Treatment: Gait belt Activity Tolerance: Patient tolerated treatment well Patient left: in chair;with call bell/phone within reach Nurse Communication: Mobility status PT Visit Diagnosis: Unsteadiness on feet (R26.81);Hemiplegia and hemiparesis Hemiplegia - Right/Left: Right Hemiplegia - dominant/non-dominant: Dominant Hemiplegia - caused by: Cerebral infarction     Time: 2637-8588 PT Time Calculation (min) (ACUTE ONLY): 21 min  Charges:  $Therapeutic Activity: 8-22 mins                     Rolinda Roan, PT, DPT Acute Rehabilitation Services Pager: [phone] Office: [phone]    Thelma Comp 08/04/2019, 12:23 PM

## 2019-08-04 NOTE — CV Procedure (Signed)
    TRANSESOPHAGEAL ECHOCARDIOGRAM   NAME:  [name]    MRN: [id number] DOB:  01-22-1946    ADMIT DATE: 08/01/2019  INDICATIONS: MV mass  PROCEDURE:   Informed consent was obtained prior to the procedure. The risks, benefits and alternatives for the procedure were discussed and the patient comprehended these risks.  Risks include, but are not limited to, cough, sore throat, vomiting, nausea, somnolence, esophageal and stomach trauma or perforation, bleeding, low blood pressure, aspiration, pneumonia, infection, trauma to the teeth and death.    Procedural time out performed. The oropharynx was anesthetized with topical 1% benzocaine.    Anesthesia was administered by Dr. Sabra Heck.  The patient was administered a total of 287 mg propofol.  The patient's heart rate, blood pressure, and oxygen saturation are monitored continuously during the procedure. The period of conscious sedation is 16 minutes, of which I was present face-to-face 100% of this time.   The transesophageal probe was inserted in the esophagus and stomach without difficulty and multiple views were obtained.   COMPLICATIONS:    There were no immediate complications.  KEY FINDINGS:  1. 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 Would check blood cultures to ensure this is not a vegetation, but no regurgitation to suggest this is endocarditis. 2. Moderate size PFO. 3. Normal LVEF 60%. 4. Severely dilated RV. Severely reduced RV function.  5. Moderate to severe TR.  6. Full report to follow. 7. Further management per primary team.   [name]. [name], [place]  [address], [place], [place] [address] [phone]  1:17 PM

## 2019-08-04 NOTE — Anesthesia Postprocedure Evaluation (Signed)
Anesthesia Post Note  Patient: Breella R Eifler  Procedure(s) Performed: TRANSESOPHAGEAL ECHOCARDIOGRAM (TEE) (N/A )     Patient location during evaluation: Endoscopy Anesthesia Type: MAC Level of consciousness: awake and alert Pain management: pain level controlled Vital Signs Assessment: post-procedure vital signs reviewed and stable Respiratory status: spontaneous breathing, nonlabored ventilation and respiratory function stable Cardiovascular status: blood pressure returned to baseline and stable Postop Assessment: no apparent nausea or vomiting Anesthetic complications: no   No complications documented.  Last Vitals:  Vitals:   08/04/19 1211 08/04/19 1328  BP: (!) 180/71 (!) 144/55  Pulse: 82 81  Resp: (!) 21 (!) 22  Temp: 37.4 C 36.8 C  SpO2: 97% 97%    Last Pain:  Vitals:   08/04/19 1328  TempSrc: Oral  PainSc: 0-No pain                 Lynda Rainwater

## 2019-08-04 NOTE — Progress Notes (Signed)
STROKE TEAM PROGRESS NOTE   INTERVAL HISTORY Patient is sitting up in bed.  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  Bubble study was also positive indicating right to left intracardiac shunt likely PFO.  She has no history of DVT.  Plan to check lower extremity venous Dopplers.  Neuro exam is unchanged  Vitals:   08/04/19 1211 08/04/19 1328 08/04/19 1340 08/04/19 1350  BP: (!) 180/71 (!) 144/55    Pulse: 82 81    Resp: (!) 21 (!) 22    Temp: 99.3 F (37.4 C) 98.3 F (36.8 C)    TempSrc: Oral Oral    SpO2: 97% 97% 100% 97%  Weight: 130.6 kg     Height: _0  (1.6 m)       CBC:  Recent Labs  Lab 08/01/19 1349 08/01/19 1349 08/01/19 1358 08/04/19 0416  WBC 8.1  --   --  6.0  NEUTROABS 4.5  --   --   --   HGB 11.4*   < > 12.6 9.4*  HCT 41.0   < > 37.0 32.9*  MCV 89.5  --   --  87.0  PLT 280  --   --  236   < > = values in this interval not displayed.    Basic Metabolic Panel:  Recent Labs  Lab 08/03/19 0502 08/04/19 0416  NA 140 142  K 4.6 4.6  CL 108 110  CO2 23 25  GLUCOSE 95 91  BUN 23 20  CREATININE 1.30* 1.13*  CALCIUM 8.9 9.0   Lipid Panel:     Component Value Date/Time   CHOL 121 08/02/2019 0552   TRIG 131 08/02/2019 0552   HDL 19 (L) 08/02/2019 0552   CHOLHDL 6.4 08/02/2019 0552   VLDL 26 08/02/2019 0552   LDLCALC 76 08/02/2019 0552   HgbA1c:  Lab Results  Component Value Date   HGBA1C 5.7 (H) 08/02/2019   Urine Drug Screen:     Component Value Date/Time   LABOPIA POSITIVE (A) 08/01/2019 1518   COCAINSCRNUR NONE DETECTED 08/01/2019 1518   LABBENZ NONE DETECTED 08/01/2019 1518   AMPHETMU NONE DETECTED 08/01/2019 1518   THCU NONE DETECTED 08/01/2019 1518   LABBARB NONE DETECTED 08/01/2019 1518    Alcohol Level     Component Value Date/Time   ETH <10 08/01/2019 1349    IMAGING past 24 hours ECHO TEE  Result Date: 08/04/2019    TRANSESOPHOGEAL ECHO REPORT   Patient  Name:   [name] Mehta Date of Exam: 08/04/2019 Medical Rec #:  [id number]    Height:       63.0 in Accession #:    [id number]   Weight:       282.6 lb Date of Birth:  [birth year]    BSA:          2.240 m Patient Age:    73 years     BP:           180/71 mmHg Patient Gender: F            HR:           82 bpm. Exam Location:  Inpatient Procedure: Transesophageal Echo Indications:    mitral valve evaluation  History:        Patient has prior history of Echocardiogram examinations, most                 recent 08/02/2019. CAD; Risk Factors:Hypertension,  Dyslipidemia                 and Former Smoker.  Sonographer:    Jannett Celestine RDCS (AE) Referring Phys: [id number] [place]: After discussion of the risks and benefits of a TEE, an informed consent was obtained from the patient. TEE procedure time was 16 minutes. The transesophogeal probe was passed without difficulty through the esophogus of the patient. Imaged were obtained with the patient in a left lateral decubitus position. Local oropharyngeal anesthetic was provided with Cetacaine. Sedation performed by different physician. The patient was monitored while under deep sedation. Anesthestetic sedation was provided intravenously by Anesthesiology: 223m of Propofol. Image quality was excellent. The patient's vital signs; including heart rate, blood pressure, and oxygen saturation; remained stable throughout the procedure. The patient developed no complications during the procedure. IMPRESSIONS  1. There is a 1.1 x 0.7 cm mass attached to the P2 segment of the PMVL. 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 There is no significant regurgitation of the mitral valve or destruction to suggest this is a vegetation. This is a native valve  so this is unlikely to represent thrombus. Would recommend blood cultures to ensure there is no endocarditis. The mitral valve is abnormal. Trivial mitral valve regurgitation. No evidence of mitral stenosis.  2.  Agitated saline contrast bubble study was positive with shunting observed within 3-6 cardiac cycles suggestive of interatrial shunt. A moderate size PFO is present with R to L shunting.  3. Left ventricular ejection fraction, by estimation, is 70 to 75%. The left ventricle has hyperdynamic function. The left ventricle has no regional wall motion abnormalities. There is the interventricular septum is flattened in systole and diastole, consistent with right ventricular pressure and volume overload.  4. Right ventricular systolic function is moderately reduced. The right ventricular size is severely enlarged.  5. No left atrial/left atrial appendage thrombus was detected. The LAA emptying velocity was 58 cm/s.  6. Right atrial size was moderately dilated.  7. The tricuspid valve is abnormal. Tricuspid valve regurgitation is moderate to severe.  8. The aortic valve is tricuspid. Aortic valve regurgitation is mild. Mild aortic valve sclerosis is present, with no evidence of aortic valve stenosis.  9. There is Moderate (Grade III) layered plaque involving the transverse aorta and descending aorta. 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 Moderate PFO. Severely dilated RV with moderately reduced function consistent with pulmonary hypertension. FINDINGS  Left Ventricle: Left ventricular ejection fraction, by estimation, is 70 to 75%. The left ventricle has hyperdynamic function. The left ventricle has no regional wall motion abnormalities. The left ventricular internal cavity size was small. There is no  left ventricular hypertrophy. The interventricular septum is flattened in systole and diastole, consistent with right ventricular pressure and volume overload. Right Ventricle: The right ventricular size is severely enlarged. No increase in right ventricular wall thickness. Right ventricular systolic function is moderately reduced. Left Atrium: Left atrial size was  normal in size. No left atrial/left atrial appendage thrombus was detected. The LAA emptying velocity was 58 cm/s. Right Atrium: Right atrial size was moderately dilated. Pericardium: There is no evidence of pericardial effusion. Mitral Valve: There is a 1.1 x 0.7 cm mass attached to the P2 segment of the PMVL. 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 There is no significant regurgitation of the mitral valve or destruction to suggest this is a vegetation. This is a native valve so this is unlikely to represent thrombus. Would recommend  blood cultures to ensure there is no endocarditis. The mitral valve is abnormal. Trivial mitral valve regurgitation. No evidence of mitral valve stenosis. Tricuspid Valve: The tricuspid valve is abnormal. Tricuspid valve regurgitation is moderate to severe. No evidence of tricuspid stenosis. Aortic Valve: The aortic valve is tricuspid. . There is mild thickening and mild calcification of the aortic valve. Aortic valve regurgitation is mild. Mild aortic valve sclerosis is present, with no evidence of aortic valve stenosis. There is mild thickening of the aortic valve. There is mild calcification of the aortic valve. Pulmonic Valve: The pulmonic valve was grossly normal. Pulmonic valve regurgitation is not visualized. No evidence of pulmonic stenosis. Aorta: The aortic root and ascending aorta are structurally normal, with no evidence of dilitation. There is moderate (Grade III) layered plaque involving the transverse aorta and descending aorta. IAS/Shunts: There is left bowing of the interatrial septum, suggestive of elevated right atrial pressure. The atrial septum is grossly normal. Agitated saline contrast was given intravenously to evaluate for intracardiac shunting. Agitated saline contrast bubble study was positive with shunting observed within 3-6 cardiac cycles suggestive of interatrial shunt. A moderate size PFO is present with R to L shunting.   AORTA Ao Root  diam: 3.30 cm Ao Asc diam:  2.96 cm TRICUSPID VALVE TR Peak grad:   55.1 mmHg TR Vmax:        371.00 cm/s Eleonore Chiquito MD Electronically signed by Eleonore Chiquito MD Signature Date/Time: 08/04/2019/2:26:07 PM    Final     PHYSICAL EXAM Obese elderly African-American lady not in distress. . Afebrile. Head is nontraumatic. Neck is supple without bruit.    Cardiac exam no murmur or gallop. Lungs are clear to auscultation. Distal pulses are well felt. Neurological Exam :  Awake alert oriented to time place and person.  Speech is normal without dysarthria or aphasia.  Extraocular movements are full range without nystagmus.  Blinks to threat bilaterally.  Mild right lower facial weakness.  Tongue midline.  Motor system exam shows mild right upper extremity drift and weakness distally with diminished fine finger movements and orbits left over right upper extremity.  Mild left finger-to-nose dysmetria.  Sensation is diminished in the right arm and right leg subjectively.  Deep tendon reflexes symmetric.  Gait not tested. ASSESSMENT/PLAN Ms. IKHLAS ALBO is a 74 y.o. female with history of HTN, HLD, CAD presenting with RUE weakness.    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   CT CS no fx or acute abnormality  MRI  Small cortical/ subcortical L pre+postcentral gyri. Subcentimeter R cerebellar infarct.  MRA  Gross patency, R VA and R PICA not well seen  Carotid Doppler  B ICA 1-39% stenosis, VAs antegrade   2D Echo status post   [place] electrophysiologist will consult and consider placement of an implantable loop recorder to evaluate for atrial fibrillation as etiology of stroke. This has been explained to patient/family by Dr. Leonie Man and they are agreeable.   LDL 76  HgbA1c 5.7  Lovenox 40 mg sq daily for VTE prophylaxis  No antithrombotic prior to admission,  now on aspirin 81 mg daily and clopidogrel 75 mg daily following plavix load    Therapy recommendations:  SNF  Disposition:  pending   Hypertension  Stable . Permissive hypertension (OK if < 220/120) but gradually normalize in 5-7 days . Long-term  BP goal normotensive  Hyperlipidemia  Home meds:  zocor 40  Now on crestor 20  LDL 76, goal < 70  Continue statin at discharge  Other Stroke Risk Factors  Advanced age  Former Cigarette smoker  Morbid Obesity, Body mass index is 51.02 kg/m., recommend weight loss, diet and exercise as appropriate   Coronary artery disease  Other Active Problems  R on weekly methotrexate, plaquenil, lefunomide, prednisone   Chronic pain syndrome  Hypothyroidism  CKD stage II   Hospital day # 3 She presented with right upper extremity weakness secondary to embolic left MCA as well as left cerebellar infarcts etiology to be determined but strong suspicion for paroxysmal A. fib.  TEE shows fibroblastoma but literature does not support clear indication for long-term anticoagulation being superior to antiplatelet therapy in this situation.  Open heart surgery is a possibility but has significant morbidity associated.  Still recommend loop recorder for paroxysmal A. fib upon discharge.  Aspirin and Plavix for 3 weeks followed by aspirin alone.  Recommend check lower extremity venous Dopplers for DVT.  Discussed with family Electrical engineer.  Greater than 50% time during this 25-minute visit was spent on counseling and coordination of care about her embolic stroke and discussion about stroke prevention, evaluation and treatment and answering questions Antony Contras, MD   To contact Stroke Continuity provider, please refer to [email or web address] After hours, contact General Neurology

## 2019-08-04 NOTE — Progress Notes (Signed)
Family Medicine Teaching Service Daily Progress Note Intern Pager: [phone]  Patient name: [name] Medical record number: [id number] Date of birth: [birth year] Age: 74 y.o. Gender: female  Primary Care Provider: Dixie Dials, MD Consultants: Neurology Code Status: FULL  Pt Overview and Major Events to Date:  08/02/2019- Patient admitted and dx. With acute CVA 08/03/2019- Patient received echo and Carotid Vas [place]  Assessment and Plan: Acute CVA: Stable. Ms. Bowland increase use in her right hand. She was able to extend the fingers in the hand and displayed decreased active ROM in the right wrist due to pain in the right shoulder; however passive ROM was equal in both wrists. Patient did not have pain with active extension of the right hand.CT and MRI imaging in ED was limited by motion artifact, howeverMR brain w/ocontrast showed small acute cortical/subcortical infarct affecting the left pre and postcentral gyri, additional subcentimeter acute infarct within the right cerebellum.Patient received Carotid US which did not show significant abnormalities. Patient received echo, EF 60-65% with mild left ventricular hypertrophy, right ventricle is moderately enlarged, severely elevated PA systolic pressure, RA was mildly dilated, and mild aortic regurgitation. Echo was unable to r/o vegetation and it is suggested TEE to further assess.  No known history of A fib/flutter and not present on EKG. Neurology recommends a loop recorder prior to discharge. RF including age, HLD, HTN, and RA. Clopidogrel 300 mg p.o. once received in ED 7/5.Patient BP sys range from 128-141. Patient breathing comfortably on 2L nasal cannula with recorded intermittent breaks on RA. PT/OT recommend that patient go to a SNF at discharge. It has been discussed with patient that she has been recommended to be discharge to a SNF. Patient and daughter have decided on SNF per SW note. Patient will undergo TEE.  --Monitor  telemetry  --A1c at admission is 5.7A1c --ASA + plavix for 3 weeks followed by ASA alone --Continue Crestor -- Monitor patient on pulse ox -- Ambulate with pulse ox -- monitor O2 requirements --Will likely need cardiac monitor or loop recorder given embolic concern  --PT/OT/ ST -Frequent neuro checks -Fall precautions, up with assistance -Monitor vitals, -TEE, 7/8 - Consult SW for SNF  Hypertension: Chronic, stable. Takes metoprolol 92m BID at home. - Continue to hold metoprolol - Sys BP 128-141  CKD Stage II: Stable. Creatinine 1.54on admit. Will continue to monitor to assess if patient has had progression, at this time it does not appears so as patient has had a downward trend since admission. Cr 1.13 today continuing downward trend. HgbA1c at this admission is 5.7. --Monitor BMP in am  Rheumatoid arthritis: Chronic, stable. Takes Etanercept weekly (on fridays), plaquenil, and leflunomide for control. Additionally on chronic prednisone 591m Follows with rheumatology outpatient. -Continue home meds  - Continue Protonix2073m Hyperlipidemia: Chronic, stable. - Crestor 11m31mgh intensity secondary prevention - Continue Crestor   Chronic pain syndrome: Stable. UDS positive only for opiates.Takes norco PRN at home, PMP aware reviewed and appropriate. #90 filled on 6/14.  --Continue home regimen  Hypothyroidism: Chronic, stable. -Continue home levothyroxine 50 mcg daily.   FEN/GI: NPO PPx: Lovenox  Disposition: SNF  Subjective:  Patient had no overnight events. Patient has a productive cough with yellow phlegm. Patient also reports that her R shoulder is causing her pain today along with increased pain in her bilaterally lower extremities. Patient is ready for her TEE today.  Objective: Temp:  [97.6 F (36.4 C)-98.4 F (36.9 C)] 98.1 F (36.7 C) (07/08 0354) Pulse  Rate:  [67-76] 69 (07/08 0354) Resp:  [14-19] 18 (07/08 0354) BP:  (128-141)/(52-63) 141/63 (07/08 0354) SpO2:  [93 %-99 %] 95 % (07/08 0354) Physical Exam: General: Resting in bed, does not appear to be in distress Cardiovascular: Regular rate and rythym no murmur detected Respiratory: Inspiratory wheeze in bilateral lobes, cough with sputum production, can hear "junk" in lungs when patient coughs Abdomen: No pain to palpation, patient has ostomy bag Extremities: Pain to palpation of bilateral lower extremities Neuro: Decreased active ROM in r wrist, but equal to left wrist with passive range of motion, no pain to palpation of the right wrist, able to abduct and adduct left should, bot able to abbduct and adduct right shoulder due to pain, no pain to palpation of the right shoulder, 3/5 grip in right and, 4.5 grip in left hand  Laboratory: Recent Labs  Lab 08/01/19 1349 08/01/19 1358 08/04/19 0416  WBC 8.1  --  6.0  HGB 11.4* 12.6 9.4*  HCT 41.0 37.0 32.9*  PLT 280  --  236   Recent Labs  Lab 08/01/19 1349 08/01/19 1358 08/02/19 0552 08/03/19 0502 08/04/19 0416  NA 141   < > 141 140 142  K 4.3   < > 4.6 4.6 4.6  CL 109   < > 110 108 110  CO2 20*   < > _0 BUN 22   < > _1 CREATININE 1.54*   < > 1.16* 1.30* 1.13*  CALCIUM 9.3   < > 8.9 8.9 9.0  PROT 6.9  --   --   --   --   BILITOT 1.0  --   --   --   --   ALKPHOS 47  --   --   --   --   ALT 13  --   --   --   --   AST 29  --   --   --   --   GLUCOSE 95   < > 71 95 91   < > = values in this interval not displayed.      Imaging/Diagnostic Tests: 7/8- TEE planned  Freida Busman, MD 08/04/2019, 7:54 AM PGY-1, [place] Intern pager: [phone], text pages welcome

## 2019-08-05 ENCOUNTER — Inpatient Hospital Stay (HOSPITAL_COMMUNITY): Payer: Medicare Other

## 2019-08-05 ENCOUNTER — Encounter (HOSPITAL_COMMUNITY)
Admission: EM | Disposition: A | Discharge status: Skilled Nursing Facility | Payer: Self-pay | Source: Home / Self Care | Attending: Family Medicine

## 2019-08-05 DIAGNOSIS — I639 Cerebral infarction, unspecified: Secondary | ICD-10-CM

## 2019-08-05 LAB — CBC
HCT: 31.5 % — ABNORMAL LOW (ref 36.0–46.0)
Hemoglobin: 9.1 g/dL — ABNORMAL LOW (ref 12.0–15.0)
MCH: 25.1 pg — ABNORMAL LOW (ref 26.0–34.0)
MCHC: 28.9 g/dL — ABNORMAL LOW (ref 30.0–36.0)
MCV: 86.8 fL (ref 80.0–100.0)
Platelets: 248 K/uL (ref 150–400)
RBC: 3.63 MIL/uL — ABNORMAL LOW (ref 3.87–5.11)
RDW: 14.9 % (ref 11.5–15.5)
WBC: 8.6 K/uL (ref 4.0–10.5)
nRBC: 0 % (ref 0.0–0.2)

## 2019-08-05 LAB — BASIC METABOLIC PANEL
Anion gap: 6 (ref 5–15)
BUN: 16 mg/dL (ref 8–23)
CO2: 26 mmol/L (ref 22–32)
Calcium: 8.8 mg/dL — ABNORMAL LOW (ref 8.9–10.3)
Chloride: 109 mmol/L (ref 98–111)
Creatinine, Ser: 1.02 mg/dL — ABNORMAL HIGH (ref 0.44–1.00)
GFR calc Af Amer: >60 mL/min (ref >60)
GFR calc non Af Amer: 55 mL/min — ABNORMAL LOW (ref >60)
Glucose, Bld: 91 mg/dL (ref 70–99)
Potassium: 4.5 mmol/L (ref 3.5–5.1)
Sodium: 141 mmol/L (ref 135–145)

## 2019-08-05 LAB — SARS CORONAVIRUS 2 (TAT 6-24 HRS): SARS Coronavirus 2: NEGATIVE

## 2019-08-05 IMAGING — CT CT ANGIO CHEST
2 of 7 series · 17 of 46 positions shown · IV contrast (APPLIED)
Comparison: No recent chest imaging.

CLINICAL DATA: Hypoxia. Shortness of breath. Known DVT.

EXAM:
CT ANGIOGRAPHY CHEST WITH CONTRAST
TECHNIQUE: Multidetector CT imaging of the chest was performed using the
standard protocol during bolus administration of intravenous
contrast. Multiplanar CT image reconstructions and MIPs were
obtained to evaluate the vascular anatomy.
CONTRAST:  75mL OMNIPAQUE IOHEXOL 350 MG/ML SOLN

[Series 7: thins · axial · 0.70mm/px · z∈[-117,+134]mm · 14 of 404 slices shown]
[im 23/404  lung]
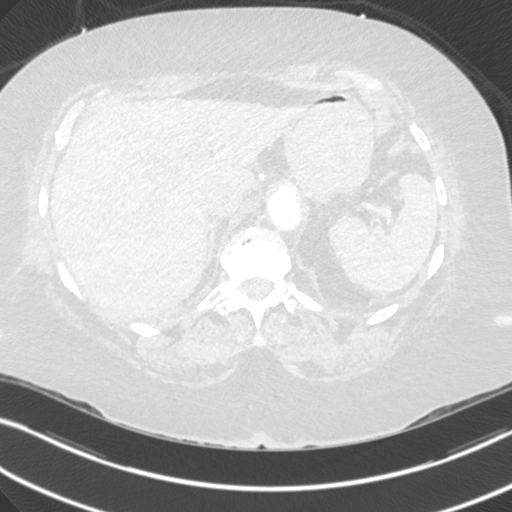
[im 45/404  soft-tissue]
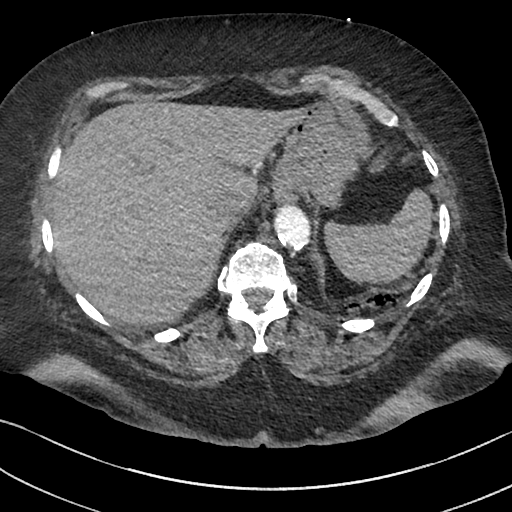
[im 90/404  lung]
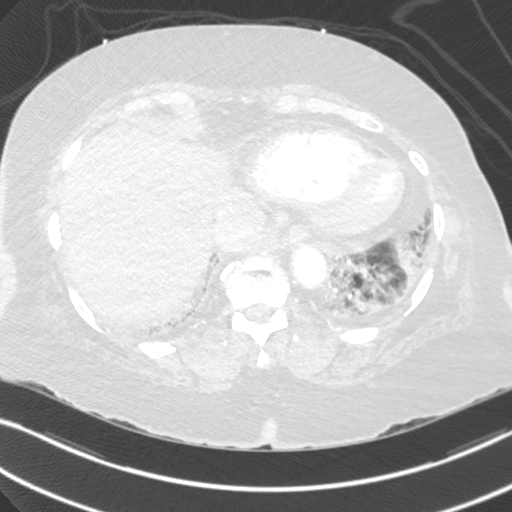
[im 112/404  soft-tissue]
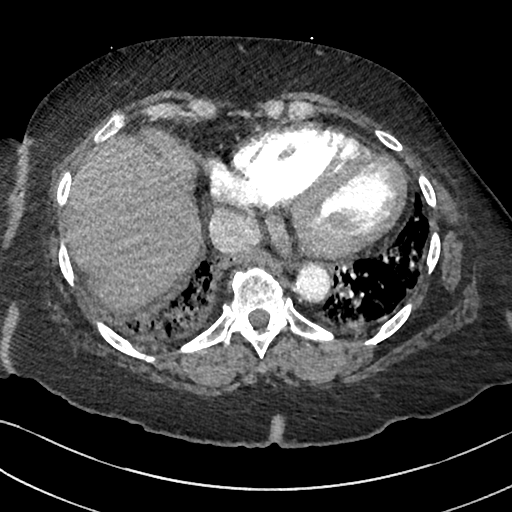
[im 135/404  lung]
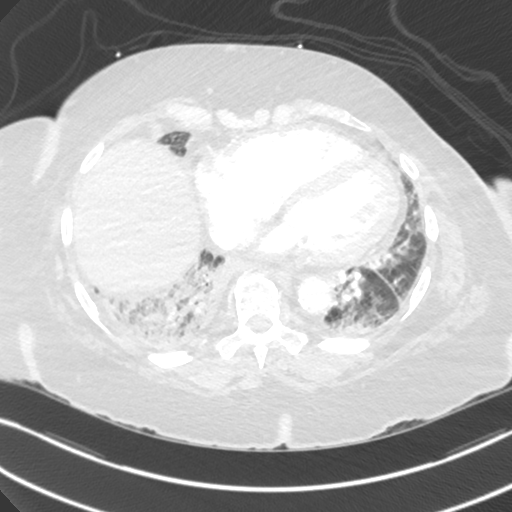
[im 157/404  soft-tissue]
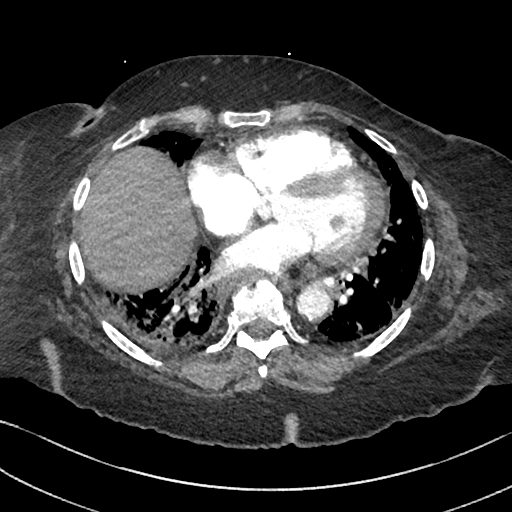
[im 180/404  lung]
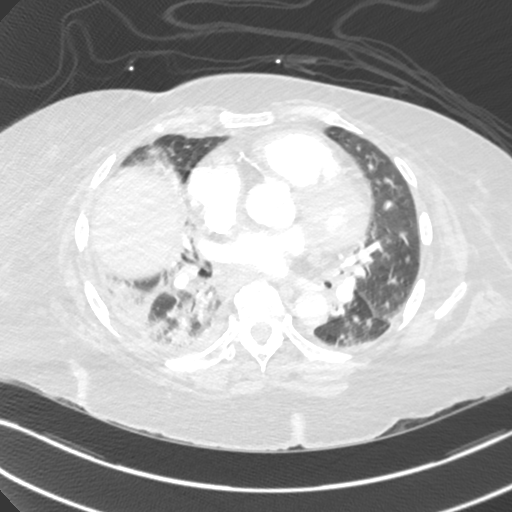
[im 224/404  soft-tissue]
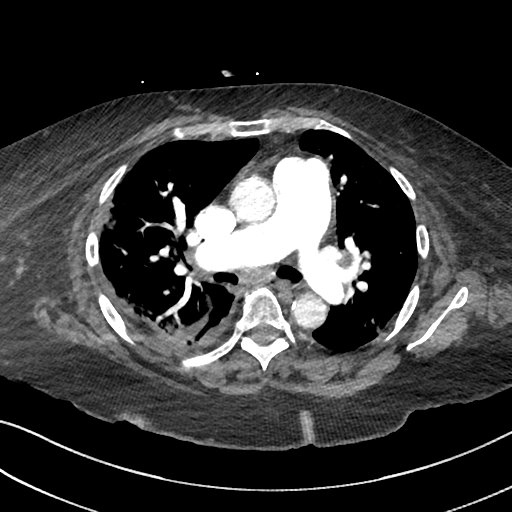
[im 247/404  lung]
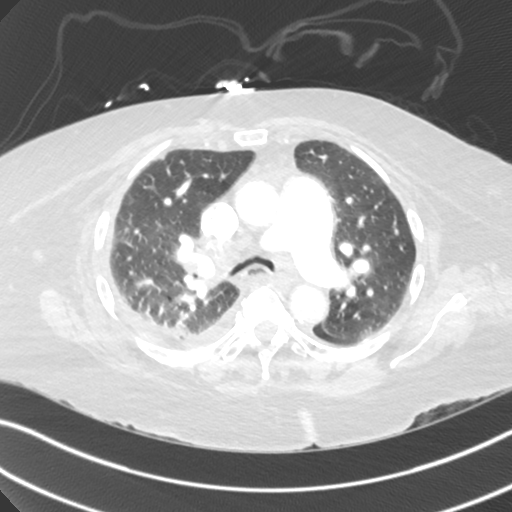
[im 269/404  soft-tissue]
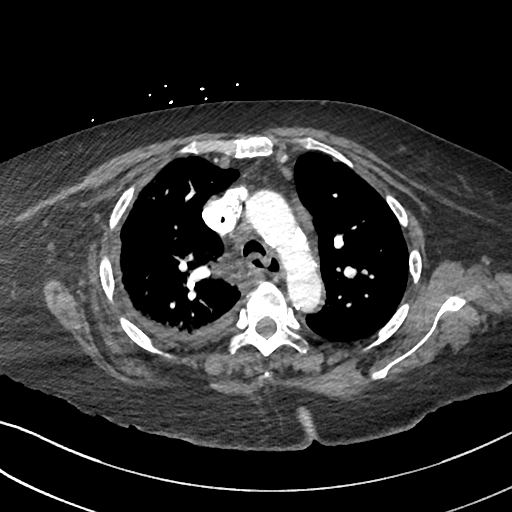
[im 292/404  lung]
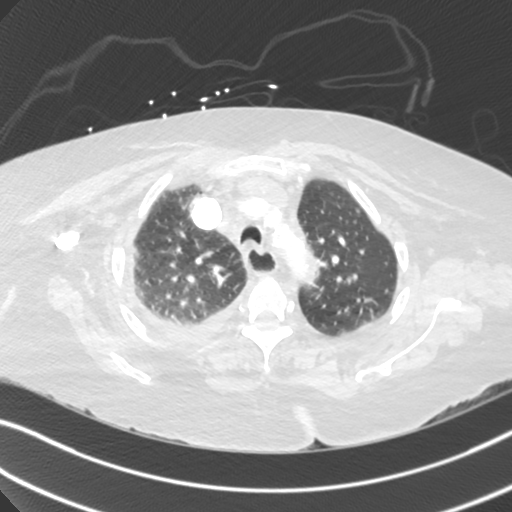
[im 314/404  soft-tissue]
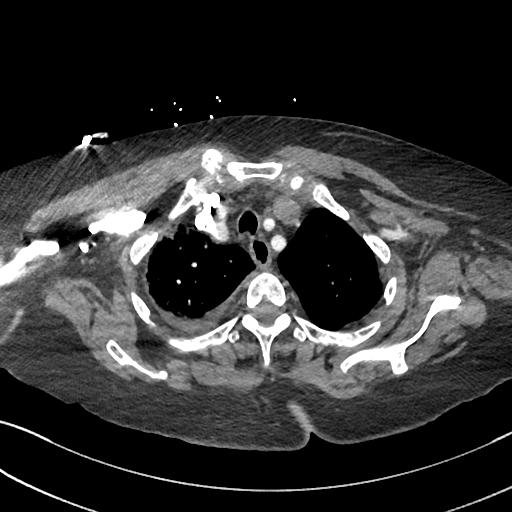
[im 359/404  lung]
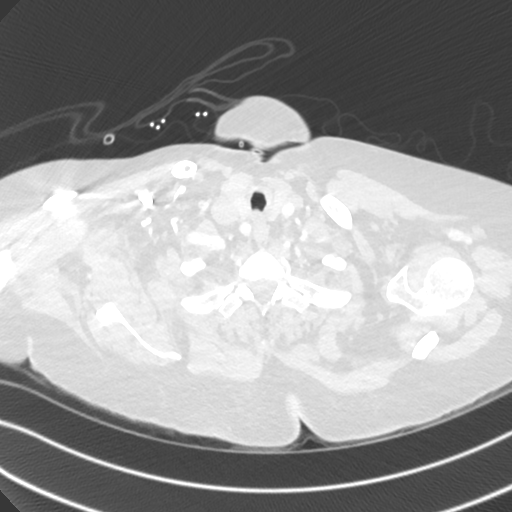
[im 381/404  soft-tissue]
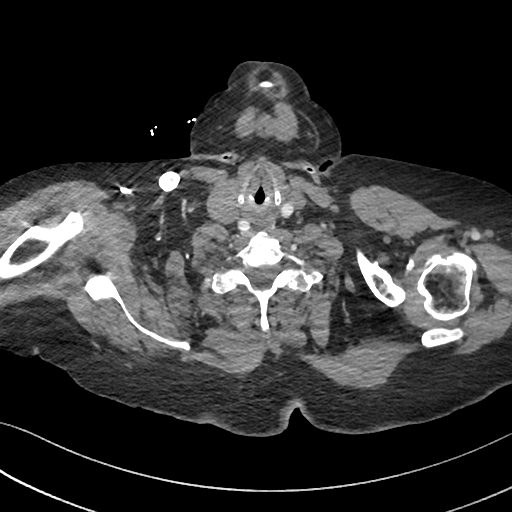

[Series 8: cor · coronal · 0.56mm/px · 3 of 157 slices shown]
[im 40/157  soft-tissue]
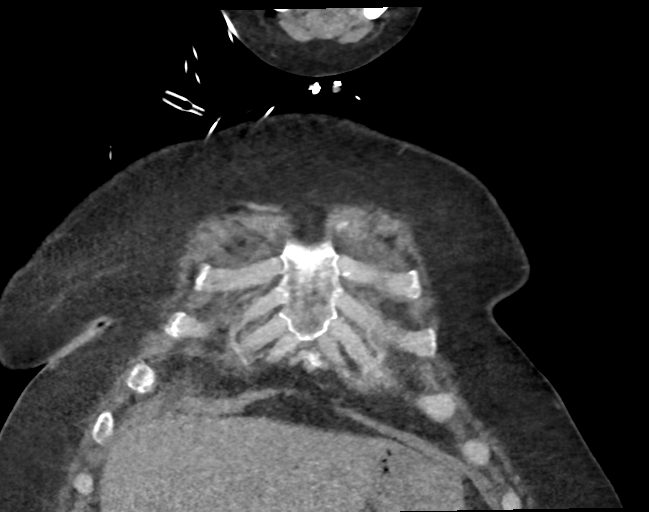
[im 79/157  soft-tissue]
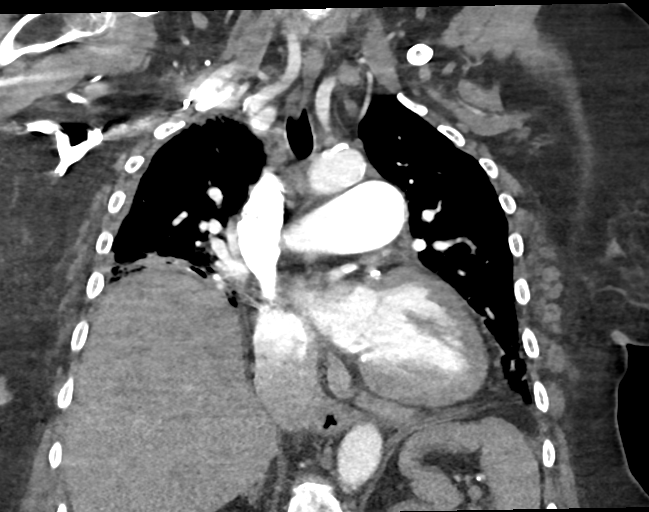
[im 118/157  soft-tissue]
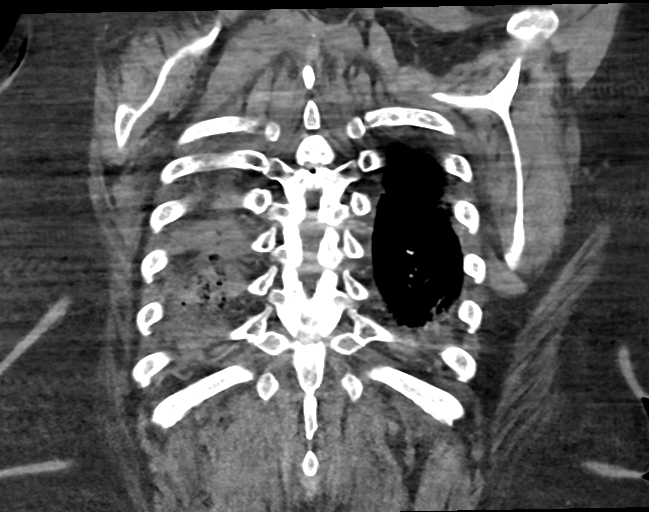

[17 of 46 positions shown; findings below may reference images not displayed]

FINDINGS: Cardiovascular: There are no filling defects within the pulmonary
arteries to suggest pulmonary embolus. Dilated central pulmonary
artery at 3.8 cm. Aortic atherosclerosis without dissection or
aneurysm. Coronary artery calcifications. Borderline cardiomegaly.
No pericardial effusion.

Mediastinum/Nodes: Shotty mediastinal and hilar lymph nodes. Largest
node is in the subcarinal station measuring 15 mm. Upper esophagus
is mildly patulous. Small hiatal hernia. No thyroid nodule.

Lungs/Pleura: Small right pleural effusion. Patchy ground-glass
opacities throughout the right lower lobe and to a lesser extent
right upper lobe with suggestion of nodular septal thickening.
Ill-defined ground-glass opacities in the lingula and dependent left
lower lobe. Expiratory imaging limits assessment of the
tracheobronchial tree. Questionable right lower lobe bronchial
thickening with mucous plugging. There is no dominant pulmonary
mass.

Upper Abdomen: Mild elevation of right hemidiaphragm. No acute
findings in the upper abdomen.

Musculoskeletal: Multilevel degenerative change in the spine. There
are no acute or suspicious osseous abnormalities.

Review of the MIP images confirms the above findings.
IMPRESSION: 1. No pulmonary embolus.
2. Small right pleural effusion. Patchy ground-glass opacities
throughout the right lower lobe and to a lesser extent right upper
lobe with suggestion of nodular septal thickening. There also patchy
ground-glass opacities in the lingula and left lower lobe. Findings
are nonspecific and differential considerations include pneumonia,
interstitial lung disease, or less likely asymmetric pulmonary
edema. Septal thickening appears slightly nodular which raises
concern for malignancy, however no dominant pulmonary nodule or mass
is seen.
3. Questionable right lower lobe bronchial thickening with mucous
plugging.
4. Shotty mediastinal and hilar lymph nodes are likely reactive.
5. Dilated central pulmonary artery suggesting pulmonary arterial
hypertension.
6. Coronary artery calcifications.

Aortic Atherosclerosis ([2R]-[2R]).

## 2019-08-05 SURGERY — LOOP RECORDER INSERTION

## 2019-08-05 MED ORDER — IOHEXOL 350 MG/ML SOLN
75.0000 mL | Freq: Once | INTRAVENOUS | Status: AC | PRN
  Administered 2019-08-05: 75 mL via INTRAVENOUS

## 2019-08-05 MED ORDER — SODIUM CHLORIDE 0.9 % IV SOLN: INTRAVENOUS | Status: AC

## 2019-08-05 MED ORDER — APIXABAN 5 MG PO TABS
5.0000 mg | ORAL_TABLET | Freq: Two times a day (BID) | ORAL | Status: DC
  Administered 2019-08-05 – 2019-08-06 (×3): 5 mg via ORAL
  Filled 2019-08-05 (×3): qty 1

## 2019-08-05 MED ORDER — METOPROLOL TARTRATE 12.5 MG HALF TABLET
12.5000 mg | ORAL_TABLET | Freq: Two times a day (BID) | ORAL | Status: DC
  Administered 2019-08-05 – 2019-08-06 (×3): 12.5 mg via ORAL
  Filled 2019-08-05 (×3): qty 1

## 2019-08-05 NOTE — TOC Progression Note (Signed)
Transition of Care [facility]) - Progression Note    Patient Details  Name: [name] MRN: [id number] Date of Birth: 10/08/1945  [facility] CM/SW Contact  Pollie Friar, RN Phone Number: 08/05/2019, 3:05 PM  Clinical Narrative:    Pt to d/c to [place] tomorrow per MD.  Donella Stade following.   Expected Discharge Plan: [place] Barriers to Discharge: Continued Medical Work up  Expected Discharge Plan and Services Expected Discharge Plan: [place] In-house Referral: Clinical Social Work Discharge Planning Services: CM Consult Post Acute Care Choice: [place] Living arrangements for the past 2 months: Apartment                                       Social Determinants of Health (SDOH) Interventions    Readmission Risk Interventions No flowsheet data found.

## 2019-08-05 NOTE — Progress Notes (Signed)
Lower extremity venous bilateral study completed.   Results discussed with [place], PA.   See Cv Proc for preliminary results.   [name]

## 2019-08-05 NOTE — Consult Note (Addendum)
ELECTROPHYSIOLOGY CONSULT NOTE  Patient ID: [name] MRN: [id number], DOB/AGE: 01-Jul-1945   Admit date: 08/01/2019 Date of Consult: 08/05/2019  Primary Physician: Dixie Dials, MD Primary Cardiologist: No primary care provider on file.  Primary Electrophysiologist: New to None  Reason for Consultation: Cryptogenic stroke; recommendations regarding Implantable Loop Recorder Insurance: Medicare  History of Present Illness EP has been asked to evaluate [name] for placement of an implantable loop recorder to monitor for atrial fibrillation by Dr Leonie Man.  The patient was admitted on 08/01/2019 with upper extremity weakness.  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  They have undergone workup for stroke including echocardiogram and carotid dopplers.  The patient has been monitored on telemetry which has demonstrated sinus rhythm with no arrhythmias.  Patient has already had TEE.    Echocardiogram this admission demonstrated LVEF 60-65% and ? Echodensity of MV.  Lab work is reviewed.  TEE 08/04/2019 with a 1.1 x 0.7 cm mass attached to the P2 segment of the PMVL. 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  Moderate sized PFO also noted.   Neurology continues to recommend LOOP recorder. LE dopplers pending in setting of PFO.  Prior to admission, the patient denies chest pain, shortness of breath, dizziness, palpitations, or syncope.  They are recovering from their stroke with plans to rehab at SNF  at discharge.  Past Medical History:  Diagnosis Date  . Anemia    takes Ferrous Sulfate daily  . Arthritis   . Chronic bronchitis ([place])    Combivent if needed)  . Colostomy care [facility]) 2004  . Coronary artery disease   . Diverticular disease   . Dizziness   . Dry eyes    uses Restasis daily  . H/O hiatal hernia   . History of blood transfusion    no abnormal reaction noted  . History of colon polyps     benign  . Hyperlipidemia    takes Zocor daily  . Hypertension    takes Metoprolol daily  . Hypothyroidism    takes Synthroid daily  . Nocturia    states only d/t being on Lasix  . Numbness and tingling    left foot  . Peripheral edema    takes Lasix daily  . Rheumatoid arthritis(714.0)    Methotrexate 2 times a week  . Shortness of breath    with exertion     Surgical History:  Past Surgical History:  Procedure Laterality Date  . ABDOMINAL HYSTERECTOMY  1965  . CAPSULOTOMY Bilateral 06/01/2012   Procedure: MINOR CAPSULOTOMY;  Surgeon: Myrtha Mantis., MD;  Location: [place];  Service: Ophthalmology;  Laterality: Bilateral;  . CATARACT EXTRACTION W/PHACO  01/01/2011   Procedure: CATARACT EXTRACTION PHACO AND INTRAOCULAR LENS PLACEMENT (IOC);  Surgeon: [name], MD;  Location: [place];  Service: Ophthalmology;  Laterality: Left;  . CATARACT EXTRACTION W/PHACO  12/24/2011   Procedure: CATARACT EXTRACTION PHACO AND INTRAOCULAR LENS PLACEMENT (IOC);  Surgeon: [name], MD;  Location: [place];  Service: Ophthalmology;  Laterality: Right;  . COLON SURGERY  2002   d/t diverticulosis  . COLONOSCOPY    . COLOSTOMY    . ESOPHAGOGASTRODUODENOSCOPY    . EYE SURGERY    . KNEE ARTHROSCOPY     bilateral  . TONSILLECTOMY    . TOTAL KNEE ARTHROPLASTY  2005/2008   bilateral  . TOTAL KNEE REVISION Right 10/24/2015   Procedure: TOTAL KNEE REVISION;  Surgeon: [name], MD;  Location: [place];  Service: Orthopedics;  Laterality: Right;  . YAG LASER APPLICATION Bilateral 09/03/4164   Procedure: YAG LASER APPLICATION;  Surgeon: Myrtha Mantis., MD;  Location: [place];  Service: Ophthalmology;  Laterality: Bilateral;     Medications Prior to Admission  Medication Sig Dispense Refill Last Dose  . cycloSPORINE (RESTASIS) 0.05 % ophthalmic emulsion Place 1 drop into both eyes 3 (three) times daily as needed (dry eyes).    few weeks ago  . etanercept (ENBREL) 50 MG/ML injection Inject  50 mg into the skin every Friday.   0/06/3014  . folic acid (FOLVITE) 1 MG tablet Take 1 mg by mouth daily.     07/31/2019 at am  . HYDROcodone-acetaminophen (NORCO) 10-325 MG tablet Take 1 tablet by mouth 3 (three) times daily as needed for severe pain.    07/31/2019 at pm  . hydroxychloroquine (PLAQUENIL) 200 MG tablet Take 200 mg by mouth 2 (two) times daily.   07/31/2019 at pm  . leflunomide (ARAVA) 10 MG tablet Take 10 mg by mouth daily.   07/31/2019 at am  . levothyroxine (SYNTHROID, LEVOTHROID) 50 MCG tablet Take 50 mcg by mouth daily.     07/31/2019 at am  . metoprolol tartrate (LOPRESSOR) 50 MG tablet Take 50 mg by mouth 2 (two) times daily.   07/31/2019 at 2000  . naproxen sodium (ALEVE) 220 MG tablet Take 220 mg by mouth daily as needed.   few months ago  . predniSONE (DELTASONE) 5 MG tablet Take 5 mg by mouth daily.   07/31/2019 at am  . simvastatin (ZOCOR) 40 MG tablet Take 40 mg by mouth at bedtime.    07/31/2019 at pm    Inpatient Medications:  . aspirin EC  81 mg Oral Daily  . clopidogrel  75 mg Oral Daily  . enoxaparin (LOVENOX) injection  40 mg Subcutaneous Q24H  . hydroxychloroquine  200 mg Oral BID  . leflunomide  10 mg Oral Daily  . levothyroxine  50 mcg Oral Daily  . pantoprazole  20 mg Oral Daily  . predniSONE  5 mg Oral Daily  . rosuvastatin  20 mg Oral q1800    Allergies: No Known Allergies  Social History   Socioeconomic History  . Marital status: Single    Spouse name: Not on file  . Number of children: Not on file  . Years of education: Not on file  . Highest education level: Not on file  Occupational History  . Not on file  Tobacco Use  . Smoking status: Former Smoker    Quit date: 10/22/2002    Years since quitting: 16.7  . Smokeless tobacco: Never Used  . Tobacco comment: quit smoking in 2004  Substance and Sexual Activity  . Alcohol use: No    Comment: quit drinking in 2004  . Drug use: No  . Sexual activity: Never    Birth control/protection: Surgical    Other Topics Concern  . Not on file  Social History Narrative  . Not on file   Social Determinants of Health   Financial Resource Strain:   . Difficulty of Paying Living Expenses:   Food Insecurity:   . Worried About Charity fundraiser in the Last Year:   . Arboriculturist in the Last Year:   Transportation Needs:   . Film/video editor (Medical):   Marland Kitchen Lack of Transportation (Non-Medical):   Physical Activity:   . Days of Exercise  per Week:   . Minutes of Exercise per Session:   Stress:   . Feeling of Stress :   Social Connections:   . Frequency of Communication with Friends and Family:   . Frequency of Social Gatherings with Friends and Family:   . Attends Religious Services:   . Active Member of Clubs or Organizations:   . Attends Archivist Meetings:   Marland Kitchen Marital Status:   Intimate Partner Violence:   . Fear of Current or Ex-Partner:   . Emotionally Abused:   Marland Kitchen Physically Abused:   . Sexually Abused:      Family History  Problem Relation Age of Onset  . Anesthesia problems Neg Hx   . Hypotension Neg Hx   . Malignant hyperthermia Neg Hx   . Pseudochol deficiency Neg Hx       Review of Systems: All other systems reviewed and are otherwise negative except as noted above.  Physical Exam: Vitals:   08/04/19 1946 08/04/19 2346 08/05/19 0320 08/05/19 0322  BP: (!) 148/56 (!) 120/54 (!) 125/57   Pulse: 86 78 86 82  Resp: _0 Temp: 98.5 F (36.9 C) 98.9 F (37.2 C) 99.1 F (37.3 C)   TempSrc: Oral Oral Oral   SpO2: 95% 94% (!) 80% 92%  Weight:      Height:        GEN- The patient is well appearing, alert and oriented x 3 today.   Head- normocephalic, atraumatic Eyes-  Sclera clear, conjunctiva pink Ears- hearing intact Oropharynx- clear Neck- supple Lungs- Clear to ausculation bilaterally, normal work of breathing Heart- Regular rate and rhythm, no murmurs, rubs or gallops  GI- soft, NT, ND, + BS Extremities- no clubbing,  cyanosis, or edema MS- no significant deformity or atrophy Skin- no rash or lesion Psych- euthymic mood, full affect   Labs:   Lab Results  Component Value Date   WBC 8.6 08/05/2019   HGB 9.1 (L) 08/05/2019   HCT 31.5 (L) 08/05/2019   MCV 86.8 08/05/2019   PLT 248 08/05/2019    Recent Labs  Lab 08/01/19 1349 08/01/19 1358 08/05/19 0520  NA 141   < > 141  K 4.3   < > 4.5  CL 109   < > 109  CO2 20*   < > 26  BUN 22   < > 16  CREATININE 1.54*   < > 1.02*  CALCIUM 9.3   < > 8.8*  PROT 6.9  --   --   BILITOT 1.0  --   --   ALKPHOS 47  --   --   ALT 13  --   --   AST 29  --   --   GLUCOSE 95   < > 91   < > = values in this interval not displayed.     Radiology/Studies: CT HEAD WO CONTRAST  Result Date: 08/01/2019 CLINICAL DATA:  Woke up with right hand weakness EXAM: CT HEAD WITHOUT CONTRAST CT CERVICAL SPINE WITHOUT CONTRAST TECHNIQUE: Multidetector CT imaging of the head and cervical spine was performed following the standard protocol without intravenous contrast. Multiplanar CT image reconstructions of the cervical spine were also generated. COMPARISON:  None. FINDINGS: CT HEAD FINDINGS Brain: No evidence of acute infarction, hemorrhage, hydrocephalus, extra-axial collection or mass lesion/mass effect. Periventricular and deep white matter hypodensity. Vascular: No hyperdense vessel or unexpected calcification. Skull: Normal. Negative for fracture or focal lesion. Sinuses/Orbits: No acute finding. Other: None. CT  CERVICAL SPINE FINDINGS Examination of the cervical spine is significantly limited by motion artifact. Alignment: Normal. Skull base and vertebrae: No acute fracture. No primary bone lesion or focal pathologic process. Soft tissues and spinal canal: No prevertebral fluid or swelling. No visible canal hematoma. Disc levels: Mild to moderate disc space height loss and osteophytosis of the lower cervical levels, worst at C5 through C7. Upper chest: Negative. Other: None.  IMPRESSION: 1. No acute intracranial pathology. Small-vessel white matter disease. Consider MRI to further evaluate for acute diffusion restricting infarction if suspected based on clinical signs and symptoms. 2. Examination of the cervical spine is significantly limited by motion artifact. Within this limitation, no obvious fracture or static subluxation of the cervical spine. 3. Mild to moderate disc space height loss and osteophytosis of the lower cervical levels, worst at C5 through C7. Electronically Signed   By: Eddie Candle M.D.   On: 08/01/2019 15:30   CT Cervical Spine Wo Contrast  Result Date: 08/01/2019 CLINICAL DATA:  Woke up with right hand weakness EXAM: CT HEAD WITHOUT CONTRAST CT CERVICAL SPINE WITHOUT CONTRAST TECHNIQUE: Multidetector CT imaging of the head and cervical spine was performed following the standard protocol without intravenous contrast. Multiplanar CT image reconstructions of the cervical spine were also generated. COMPARISON:  None. FINDINGS: CT HEAD FINDINGS Brain: No evidence of acute infarction, hemorrhage, hydrocephalus, extra-axial collection or mass lesion/mass effect. Periventricular and deep white matter hypodensity. Vascular: No hyperdense vessel or unexpected calcification. Skull: Normal. Negative for fracture or focal lesion. Sinuses/Orbits: No acute finding. Other: None. CT CERVICAL SPINE FINDINGS Examination of the cervical spine is significantly limited by motion artifact. Alignment: Normal. Skull base and vertebrae: No acute fracture. No primary bone lesion or focal pathologic process. Soft tissues and spinal canal: No prevertebral fluid or swelling. No visible canal hematoma. Disc levels: Mild to moderate disc space height loss and osteophytosis of the lower cervical levels, worst at C5 through C7. Upper chest: Negative. Other: None. IMPRESSION: 1. No acute intracranial pathology. Small-vessel white matter disease. Consider MRI to further evaluate for acute  diffusion restricting infarction if suspected based on clinical signs and symptoms. 2. Examination of the cervical spine is significantly limited by motion artifact. Within this limitation, no obvious fracture or static subluxation of the cervical spine. 3. Mild to moderate disc space height loss and osteophytosis of the lower cervical levels, worst at C5 through C7. Electronically Signed   By: Eddie Candle M.D.   On: 08/01/2019 15:30   MR ANGIO HEAD WO CONTRAST  Result Date: 08/01/2019 CLINICAL DATA:  Follow-up examination for acute stroke. EXAM: MRA HEAD WITHOUT CONTRAST TECHNIQUE: Angiographic images of the Circle of Willis were obtained using MRA technique without intravenous contrast. COMPARISON:  Prior MRI from earlier the same day. FINDINGS: ANTERIOR CIRCULATION: Examination moderately to severely degraded by motion artifact. Visualized distal cervical segments of the internal carotid arteries are patent with antegrade flow. Petrous, cavernous, and supraclinoid ICAs patent without obvious flow-limiting stenosis or other abnormality. A1 segments patent bilaterally. Grossly normal anterior communicating artery complex. Anterior cerebral arteries patent to their distal aspects without stenosis. No M1 stenosis or occlusion. Grossly normal MCA bifurcations. Distal MCA branches well perfused and grossly symmetric. POSTERIOR CIRCULATION: Dominant left vertebral artery grossly patent to the vertebrobasilar junction without appreciable stenosis. Left PICA patent proximally. Right vertebral artery diffusely hypoplastic, and is not well seen as it courses into the skull base, and may be partially occluded. Flow related signal is seen within the right  V4 segment distally, which could be partially collateral in nature. Right PICA not well seen. Basilar grossly patent to its distal aspect without stenosis. Superior cerebral arteries patent proximally. Both PCAs primarily supplied via the basilar, although small  bilateral posterior communicating arteries noted. PCAs grossly perfused to their distal aspects without stenosis. No appreciable intracranial aneurysm. IMPRESSION: 1. Technically limited exam due to extensive motion artifact. 2. Hypoplastic right vertebral artery, nonvisualized as it courses into the cranial vault, and could be occluded. Flow related signal seen within the right V4 segment distally, which could be collateral nature. Right PICA not definitely seen. Dominant left vertebral artery widely patent. 3. Otherwise gross patency of the major arterial intracranial arterial circulation. No other large vessel occlusion. No definite hemodynamically significant or correctable stenosis. Electronically Signed   By: Jeannine Boga M.D.   On: 08/01/2019 20:50   MR BRAIN WO CONTRAST  Result Date: 08/01/2019 CLINICAL DATA:  Right hand weakness. EXAM: MRI HEAD WITHOUT CONTRAST TECHNIQUE: Multiplanar, multiecho pulse sequences of the brain and surrounding structures were obtained without intravenous contrast. COMPARISON:  Noncontrast head CT performed earlier the same day 08/01/2019. FINDINGS: Brain: The patient was unable to tolerate the full examination. As a result, only axial and coronal diffusion-weighted sequences as well as a sagittal T1 weighted sequence could be obtained. There is moderate/severe motion degradation of the sagittal T1 weighted sequence. There is a small focus of cortical/subcortical restricted diffusion affecting the left pre and postcentral gyri consistent with acute infarction (for instance as seen on series 2, image 37). These findings would account for the reported right hand weakness. Additional small acute infarct within the right cerebellum (series 2, image 13) Vascular: Poorly assessed on the acquired sequences. Skull and upper cervical spine: Focal marrow lesion identified on the acquired sequences. Sinuses/Orbits: Poorly assessed on the acquired sequences. IMPRESSION:  Prematurely terminated, motion degraded and limited examination as described. Small acute cortical/subcortical infarct affecting the left pre- and postcentral gyri. This finding would account for the patient's reported right hand weakness. Additional subcentimeter acute infarct within the right cerebellum. Electronically Signed   By: Kellie Simmering DO   On: 08/01/2019 17:10   ECHOCARDIOGRAM COMPLETE  Result Date: 08/02/2019    ECHOCARDIOGRAM REPORT   Patient Name:   JOORY GOUGH Bale Date of Exam: 08/02/2019 Medical Rec #:  [id number]    Height:       63.0 in Accession #:    [id number]   Weight:       282.6 lb Date of Birth:  Jan 24, 1946    BSA:          2.240 m Patient Age:    63 years     BP:           139/69 mmHg Patient Gender: F            HR:           87 bpm. Exam Location:  Inpatient Procedure: 2D Echo, Cardiac Doppler and Color Doppler Indications:    Stroke  History:        Patient has prior history of Echocardiogram examinations, most                 recent 07/01/2017. Stroke and TIA; Signs/Symptoms:Dyspnea.  Sonographer:    Roseanna Rainbow RDCS Referring Phys: Francis Dowse Gwyndolyn Saxon A HENSEL  Sonographer Comments: Patient is morbidly obese and Technically difficult study due to poor echo windows. Image acquisition challenging due to patient body habitus. IMPRESSIONS  1. Vigorous LV  systolic function; grade 1 diastolic dysfunction; mild LVH; D shaped septum suggesting pulmonary hypertension; mildly elevated velocity across aortic valve suggesting mild AS but partially explained by vigorous LV function; mild AI; mild RAE; moderate RVE with severe RV dysfunction; moderate TR with severe pulmonary hypertension. There is an oscillating density on MV; cannot R/O vegetation; suggest TEE to further assess.  2. Left ventricular ejection fraction, by estimation, is 60 to 65%. The left ventricle has normal function. The left ventricle has no regional wall motion abnormalities. There is mild left ventricular hypertrophy. Left ventricular  diastolic parameters are consistent with Grade I diastolic dysfunction (impaired relaxation).  3. Right ventricular systolic function is severely reduced. The right ventricular size is moderately enlarged. There is severely elevated pulmonary artery systolic pressure.  4. Right atrial size was mildly dilated.  5. The mitral valve is normal in structure. No evidence of mitral valve regurgitation. No evidence of mitral stenosis.  6. Tricuspid valve regurgitation is moderate.  7. The aortic valve is tricuspid. Aortic valve regurgitation is mild. Mild aortic valve stenosis.  8. The inferior vena cava is normal in size with <50% respiratory variability, suggesting right atrial pressure of 8 mmHg. FINDINGS  Left Ventricle: Left ventricular ejection fraction, by estimation, is 60 to 65%. The left ventricle has normal function. The left ventricle has no regional wall motion abnormalities. The left ventricular internal cavity size was normal in size. There is  mild left ventricular hypertrophy. Left ventricular diastolic parameters are consistent with Grade I diastolic dysfunction (impaired relaxation). Right Ventricle: The right ventricular size is moderately enlarged.Right ventricular systolic function is severely reduced. There is severely elevated pulmonary artery systolic pressure. The tricuspid regurgitant velocity is 4.66 m/s, and with an assumed  right atrial pressure of 8 mmHg, the estimated right ventricular systolic pressure is 84.1 mmHg. Left Atrium: Left atrial size was normal in size. Right Atrium: Right atrial size was mildly dilated. Pericardium: There is no evidence of pericardial effusion. Mitral Valve: The mitral valve is normal in structure. Normal mobility of the mitral valve leaflets. Mild mitral annular calcification. No evidence of mitral valve regurgitation. No evidence of mitral valve stenosis. MV peak gradient, 9.5 mmHg. The mean mitral valve gradient is 2.5 mmHg. Tricuspid Valve: The tricuspid  valve is normal in structure. Tricuspid valve regurgitation is moderate . No evidence of tricuspid stenosis. Aortic Valve: The aortic valve is tricuspid. Aortic valve regurgitation is mild. Mild aortic stenosis is present. Aortic valve mean gradient measures 10.5 mmHg. Aortic valve peak gradient measures 22.9 mmHg. Aortic valve area, by VTI measures 1.92 cm. Pulmonic Valve: The pulmonic valve was normal in structure. Pulmonic valve regurgitation is not visualized. No evidence of pulmonic stenosis. Aorta: The aortic root is normal in size and structure. Venous: The inferior vena cava is normal in size with less than 50% respiratory variability, suggesting right atrial pressure of 8 mmHg. IAS/Shunts: No atrial level shunt detected by color flow Doppler. Additional Comments: Vigorous LV systolic function; grade 1 diastolic dysfunction; mild LVH; D shaped septum suggesting pulmonary hypertension; mildly elevated velocity across aortic valve suggesting mild AS but partially explained by vigorous LV function; mild AI; mild RAE; moderate RVE with severe RV dysfunction; moderate TR with severe pulmonary hypertension. There is an oscillating density on MV; cannot R/O vegetation; suggest TEE to further assess.  LEFT VENTRICLE PLAX 2D LVIDd:         3.59 cm     Diastology LVIDs:  2.02 cm     LV e' lateral:   7.51 cm/s LV PW:         1.34 cm     LV E/e' lateral: 10.8 LV IVS:        1.57 cm     LV e' medial:    5.44 cm/s LVOT diam:     2.30 cm     LV E/e' medial:  14.9 LV SV:         71 LV SV Index:   32 LVOT Area:     4.15 cm  LV Volumes (MOD) LV vol d, MOD A2C: 41.8 ml LV vol d, MOD A4C: 51.6 ml LV vol s, MOD A2C: 9.7 ml LV vol s, MOD A4C: 10.8 ml LV SV MOD A2C:     32.1 ml LV SV MOD A4C:     51.6 ml LV SV MOD BP:      36.1 ml RIGHT VENTRICLE             IVC RV S prime:     11.70 cm/s  IVC diam: 2.04 cm TAPSE (M-mode): 2.0 cm LEFT ATRIUM           Index       RIGHT ATRIUM           Index LA diam:      2.80 cm 1.25  cm/m  RA Area:     19.00 cm LA Vol (A2C): 57.3 ml 25.58 ml/m RA Volume:   59.20 ml  26.43 ml/m LA Vol (A4C): 28.1 ml 12.55 ml/m  AORTIC VALVE AV Area (Vmax):    1.89 cm AV Area (Vmean):   1.96 cm AV Area (VTI):     1.92 cm AV Vmax:           239.50 cm/s AV Vmean:          150.500 cm/s AV VTI:            0.372 m AV Peak Grad:      22.9 mmHg AV Mean Grad:      10.5 mmHg LVOT Vmax:         109.00 cm/s LVOT Vmean:        71.000 cm/s LVOT VTI:          0.172 m LVOT/AV VTI ratio: 0.46  AORTA Ao Root diam: 3.60 cm Ao Asc diam:  3.20 cm MITRAL VALVE                TRICUSPID VALVE MV Area (PHT): 2.45 cm     TR Peak grad:   86.9 mmHg MV Peak grad:  9.5 mmHg     TR Vmax:        466.00 cm/s MV Mean grad:  2.5 mmHg MV Vmax:       1.54 m/s     SHUNTS MV Vmean:      69.4 cm/s    Systemic VTI:  0.17 m MV Decel Time: 310 msec     Systemic Diam: 2.30 cm MV E velocity: 81.00 cm/s MV A velocity: 119.00 cm/s MV E/A ratio:  0.68 Kirk Ruths MD Electronically signed by Kirk Ruths MD Signature Date/Time: 08/02/2019/4:07:26 PM    Final    ECHO TEE  Result Date: 08/04/2019    TRANSESOPHOGEAL ECHO REPORT   Patient Name:   RANADA VIGORITO Dysert Date of Exam: 08/04/2019 Medical Rec #:  [id number]    Height:       63.0 in Accession #:  [id number]   Weight:       282.6 lb Date of Birth:  [birth year]    BSA:          2.240 m Patient Age:    60 years     BP:           180/71 mmHg Patient Gender: F            HR:           82 bpm. Exam Location:  Inpatient Procedure: Transesophageal Echo Indications:    mitral valve evaluation  History:        Patient has prior history of Echocardiogram examinations, most                 recent 08/02/2019. CAD; Risk Factors:Hypertension, Dyslipidemia                 and Former Smoker.  Sonographer:    Jannett Celestine RDCS (AE) Referring Phys: [id number] [place]: After discussion of the risks and benefits of a TEE, an informed consent was obtained from the patient. TEE procedure time was 16  minutes. The transesophogeal probe was passed without difficulty through the esophogus of the patient. Imaged were obtained with the patient in a left lateral decubitus position. Local oropharyngeal anesthetic was provided with Cetacaine. Sedation performed by different physician. The patient was monitored while under deep sedation. Anesthestetic sedation was provided intravenously by Anesthesiology: 223m of Propofol. Image quality was excellent. The patient's vital signs; including heart rate, blood pressure, and oxygen saturation; remained stable throughout the procedure. The patient developed no complications during the procedure. IMPRESSIONS  1. There is a 1.1 x 0.7 cm mass attached to the P2 segment of the PMVL. 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 There is no significant regurgitation of the mitral valve or destruction to suggest this is a vegetation. This is a native valve  so this is unlikely to represent thrombus. Would recommend blood cultures to ensure there is no endocarditis. The mitral valve is abnormal. Trivial mitral valve regurgitation. No evidence of mitral stenosis.  2. Agitated saline contrast bubble study was positive with shunting observed within 3-6 cardiac cycles suggestive of interatrial shunt. A moderate size PFO is present with R to L shunting.  3. Left ventricular ejection fraction, by estimation, is 70 to 75%. The left ventricle has hyperdynamic function. The left ventricle has no regional wall motion abnormalities. There is the interventricular septum is flattened in systole and diastole, consistent with right ventricular pressure and volume overload.  4. Right ventricular systolic function is moderately reduced. The right ventricular size is severely enlarged.  5. No left atrial/left atrial appendage thrombus was detected. The LAA emptying velocity was 58 cm/s.  6. Right atrial size was moderately dilated.  7. The tricuspid valve is abnormal. Tricuspid valve  regurgitation is moderate to severe.  8. The aortic valve is tricuspid. Aortic valve regurgitation is mild. Mild aortic valve sclerosis is present, with no evidence of aortic valve stenosis.  9. There is Moderate (Grade III) layered plaque involving the transverse aorta and descending aorta. 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 Moderate PFO. Severely dilated RV with moderately reduced function consistent with pulmonary hypertension. FINDINGS  Left Ventricle: Left ventricular ejection fraction, by estimation, is 70 to 75%. The left ventricle has hyperdynamic function. The left ventricle has no regional wall motion abnormalities. The left ventricular internal cavity size was small. There is no  left ventricular hypertrophy. The  interventricular septum is flattened in systole and diastole, consistent with right ventricular pressure and volume overload. Right Ventricle: The right ventricular size is severely enlarged. No increase in right ventricular wall thickness. Right ventricular systolic function is moderately reduced. Left Atrium: Left atrial size was normal in size. No left atrial/left atrial appendage thrombus was detected. The LAA emptying velocity was 58 cm/s. Right Atrium: Right atrial size was moderately dilated. Pericardium: There is no evidence of pericardial effusion. Mitral Valve: There is a 1.1 x 0.7 cm mass attached to the P2 segment of the PMVL. 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 There is no significant regurgitation of the mitral valve or destruction to suggest this is a vegetation. This is a native valve so this is unlikely to represent thrombus. Would recommend blood cultures to ensure there is no endocarditis. The mitral valve is abnormal. Trivial mitral valve regurgitation. No evidence of mitral valve stenosis. Tricuspid Valve: The tricuspid valve is abnormal. Tricuspid valve regurgitation is moderate to severe. No  evidence of tricuspid stenosis. Aortic Valve: The aortic valve is tricuspid. . There is mild thickening and mild calcification of the aortic valve. Aortic valve regurgitation is mild. Mild aortic valve sclerosis is present, with no evidence of aortic valve stenosis. There is mild thickening of the aortic valve. There is mild calcification of the aortic valve. Pulmonic Valve: The pulmonic valve was grossly normal. Pulmonic valve regurgitation is not visualized. No evidence of pulmonic stenosis. Aorta: The aortic root and ascending aorta are structurally normal, with no evidence of dilitation. There is moderate (Grade III) layered plaque involving the transverse aorta and descending aorta. IAS/Shunts: There is left bowing of the interatrial septum, suggestive of elevated right atrial pressure. The atrial septum is grossly normal. Agitated saline contrast was given intravenously to evaluate for intracardiac shunting. Agitated saline contrast bubble study was positive with shunting observed within 3-6 cardiac cycles suggestive of interatrial shunt. A moderate size PFO is present with R to L shunting.   AORTA Ao Root diam: 3.30 cm Ao Asc diam:  2.96 cm TRICUSPID VALVE TR Peak grad:   55.1 mmHg TR Vmax:        371.00 cm/s Eleonore Chiquito MD Electronically signed by Eleonore Chiquito MD Signature Date/Time: 08/04/2019/2:26:07 PM    Final    VAS US CAROTID  Result Date: 08/02/2019 Carotid Arterial Duplex Study Indications:       CVA. Risk Factors:      Hypertension, hyperlipidemia. Comparison Study:  No prior studies. Performing Technologist: [name]  Examination Guidelines: A complete evaluation includes B-mode imaging, spectral Doppler, color Doppler, and power Doppler as needed of all accessible portions of each vessel. Bilateral testing is considered an integral part of a complete examination. Limited examinations for reoccurring indications may be performed as noted.  Right Carotid Findings:  +----------+--------+--------+--------+------------------+------------------+           PSV cm/sEDV cm/sStenosisPlaque DescriptionComments           +----------+--------+--------+--------+------------------+------------------+ CCA Prox  87      13                                intimal thickening +----------+--------+--------+--------+------------------+------------------+ CCA Distal78      13              calcific                             +----------+--------+--------+--------+------------------+------------------+  ICA Prox  45      7                                                    +----------+--------+--------+--------+------------------+------------------+ ICA Distal59      10                                                   +----------+--------+--------+--------+------------------+------------------+ +----------+--------+-------+----------------+-------------------+           PSV cm/sEDV cmsDescribe        Arm Pressure (mmHG) +----------+--------+-------+----------------+-------------------+ [id number]             Multiphasic, WNL                    +----------+--------+-------+----------------+-------------------+ +---------+--------+--+--------+-+---------+ VertebralPSV cm/s58EDV cm/s9Antegrade +---------+--------+--+--------+-+---------+  Left Carotid Findings: +----------+--------+--------+--------+------------------+------------------+           PSV cm/sEDV cm/sStenosisPlaque DescriptionComments           +----------+--------+--------+--------+------------------+------------------+ CCA Prox  95      12                                intimal thickening +----------+--------+--------+--------+------------------+------------------+ CCA Distal70      10                                intimal thickening +----------+--------+--------+--------+------------------+------------------+ ICA Prox  64      12                                                    +----------+--------+--------+--------+------------------+------------------+ ICA Distal100     21                                                   +----------+--------+--------+--------+------------------+------------------+ ECA       47                                                           +----------+--------+--------+--------+------------------+------------------+ +----------+--------+--------+----------------+-------------------+           PSV cm/sEDV cm/sDescribe        Arm Pressure (mmHG) +----------+--------+--------+----------------+-------------------+ [id number]             Multiphasic, WNL                    +----------+--------+--------+----------------+-------------------+ +---------+--------+--+--------+--+---------+ VertebralPSV cm/s68EDV cm/s11Antegrade +---------+--------+--+--------+--+---------+   Summary: Right Carotid: The extracranial vessels were near-normal with only minimal wall                thickening or plaque. Left Carotid: The extracranial vessels were near-normal with only minimal wall  thickening or plaque. Vertebrals:  Bilateral vertebral arteries demonstrate antegrade flow. Subclavians: Normal flow hemodynamics were seen in bilateral subclavian              arteries. *See table(s) above for measurements and observations.  Electronically signed by Deitra Mayo MD on 08/02/2019 at 11:00:22 PM.    Final     12-lead ECG On admission showed NSR at 80 bpm, QRS 139 ms in RBBB pattern (personally reviewed) All prior EKG's in EPIC reviewed with no documented atrial fibrillation  Telemetry NSR 70-80s (personally reviewed)  Assessment and Plan:  1. Cryptogenic stroke The patient presents with cryptogenic stroke.  The patient has had TEE as above.   I spoke at length with the patient about monitoring for afib with an implantable loop recorder.  Risks, benefits, and alteratives to implantable loop  recorder were discussed with the patient today.   At this time, the patient is very clear in their decision to proceed with implantable loop recorder of LE dopplers are unremarkable.  2. 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  Open heart surgery is a possibility but has significant morbidity associated.  Still recommend loop recorder for paroxysmal A. fib upon discharge" Thus, we will plan loop as long as LE dopplers are negative for DVT.   Wound care was reviewed with the patient (keep incision clean and dry for 3 days).  Wound check scheduled and entered in AVS. Please call with questions.   Shirley Friar, PA-C 08/05/2019 9:55 AM  EP Attending  Patient seen and examined. She has a complicated history which includes an unexplained stroke with TEE demonstrating a fibro-elastoma. We will reach out to the neuro service with regard to long term anti-coagulation. A case could be made to anti-coagulate this patient with an [place].  Mikle Bosworth.D.

## 2019-08-05 NOTE — Plan of Care (Signed)
  Problem: Clinical Measurements: Goal: Will remain free from infection Outcome: Progressing Goal: Cardiovascular complication will be avoided Outcome: Progressing   Problem: Activity: Goal: Risk for activity intolerance will decrease Outcome: Progressing   Problem: Nutrition: Goal: Adequate nutrition will be maintained Outcome: Progressing

## 2019-08-05 NOTE — Progress Notes (Addendum)
   LE Dopplers positive for DVT.   Plan to re-evaluate for monitoring as an outpatient.   Dr. Lovena Le aware.   Legrand Como [address] [place], PA-C  08/05/2019 3:26 PM   Mikle Bosworth.D.

## 2019-08-05 NOTE — Progress Notes (Signed)
Family Medicine Teaching Service Daily Progress Note Intern Pager: [phone]  Patient name: [name] Medical record number: [id number] Date of birth: 09-22-1945 Age: 74 y.o. Gender: female  Primary Care Provider: Dixie Dials, MD Consultants: Cardiology and Neurology Code Status: FULL  Pt Overview and Major Events to Date:  08/02/2019- Patient admitted and dx. With acute CVA 08/03/2019- Patient received echo and Carotid Vas [place] 08/04/2019- Patient received TEE  Assessment and Plan: Acute CVA: Stable. Ms. Dufford increase use in her right hand. She was able to extend the fingers in the hand and displayed decreased active ROM in the right wrist due to pain in the right shoulder; however passive ROM was equal in both wrists. Patient did not have pain with active extension of the right hand.CT and MRI imaging in ED was limited by motion artifact, howeverMR brain w/ocontrast showed small acute cortical/subcortical infarct affecting the left pre and postcentral gyri, additional subcentimeter acute infarct within the right cerebellum.Patient received Carotid US which did not show significant abnormalities. Patient received echo, EF 60-65% with mild left ventricular hypertrophy, right ventricle is moderately enlarged, severely elevated PA systolic pressure, RA was mildly dilated, and mild aortic regurgitation. Echo was unable to r/o vegetation and it is suggested TEE to further assess.No known history of A fib/flutter and not present on EKG. Neurology recommends a loop recorder prior to discharge.RF including age, HLD, HTN, and RA. Clopidogrel 300 mg p.o. once received in ED 7/5.Patient BP sys range from 128-141.Patient breathing comfortably on 2L nasal cannula with recorded intermittent breaks on RA.PT/OT recommend that patient go to a SNF at discharge.It has been discussed with patient that she has been recommended to be discharge to a SNF. Patient and daughter have decided on SNF per SW  note. 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 Patient also had a severely dilated RV with severely reduced RV function and moderate to severe TR. Due to PFO finding lower extremity venous doppler was ordered. --Monitor telemetry  -- f/u blood culture --A1c at admission is 5.7A1c --ASA + plavixfor 3 weeks followed by ASA alone -- f/u Lower extremity venous doppler --Continue Crestor -- Monitor patient on pulse ox -- Ambulate with pulse ox -- monitor O2 requirements --Loop recorder? --PT/OT/ ST -Frequent neuro checks -Fall precautions, up with assistance -Monitor vitals, - Consult SW for SNF  Hypertension: Chronic, stable. Takes metoprolol 54m BID at home.Patient had 1 pressure with systolic 1093 however this was around the time patient was in her TEE procedure, all other sys <160. We will continue to hold metoprolol at this time as patient seems to be able to maintain sys<180. - Continue to hold metoprolol - Sys BP 120-180  CKD Stage II: Stable. Creatinine 1.54on admit. Will continue to monitor to assess if patient has had progression, at this time it does not appears so as patient has had a downward trend since admission. Cr1.02 today continuing downward trend.HgbA1c at this admission is 5.7. --Monitor BMP in am  Rheumatoid arthritis: Chronic, stable. Takes Etanercept weekly (on fridays), plaquenil, and leflunomide for control. Additionally on chronic prednisone 512m Follows with rheumatology outpatient. -Continue home meds  - Continue Protonix2030m Hyperlipidemia: Chronic, stable. - Crestor 90m51mgh intensity secondary prevention - Continue Crestor   Chronic pain syndrome: Stable. UDS positive only for opiates.Takes norco PRN at home, PMP aware reviewed and appropriate. #90 filled on 6/14.  --  Continue home  regimen  Hypothyroidism: Chronic, stable. -Continue home levothyroxine 50 mcg daily.     FEN/GI: [place] PPx: Lovenox  Disposition: SNF  Subjective:  Patient echo found possible papillary fibroelastoma, blood culture was ordered to rule out endocarditis. Echo also showed a moderate size PFO and RV hypertrophy with moderately reduced function.  Case Work reported that the patient's SNF would like the patient soon. Patient had no overnight events. She reports that she feels "so-so."   Objective: Temp:  [98.3 F (36.8 C)-99.3 F (37.4 C)] 99.1 F (37.3 C) (07/09 0320) Pulse Rate:  [73-86] 82 (07/09 0322) Resp:  [18-22] 19 (07/09 0320) BP: (120-180)/(54-78) 125/57 (07/09 0320) SpO2:  [80 %-100 %] 92 % (07/09 0322) Weight:  [130.6 kg] 130.6 kg (07/08 1211) Physical Exam: General: Resting in bed eating breakfast. Alert to person, place, time, and situation Cardiovascular: regular heart rate and rythym Respiratory: Clear and equal to bilateral auscl. Abdomen: RUQ rebound tenderness, ostomy bag: no pain in the area Extremities: R shoulder pain with movement. Pain of the right shoulder is felt with palpation of the anterior deltoid, not along the clavicle. Patient has decreased active ROM in the right wrist due to r shoudler pain. Patient able to extend her right hand digits. 2/5 R grip strength and 3/5 grip strength in L hand. Bilateral lower extremities pain to palpation  Laboratory: Recent Labs  Lab 08/01/19 1349 08/01/19 1349 08/01/19 1358 08/04/19 0416 08/05/19 0520  WBC 8.1  --   --  6.0 8.6  HGB 11.4*   < > 12.6 9.4* 9.1*  HCT 41.0   < > 37.0 32.9* 31.5*  PLT 280  --   --  236 248   < > = values in this interval not displayed.   Recent Labs  Lab 08/01/19 1349 08/01/19 1358 08/03/19 0502 08/04/19 0416 08/05/19 0520  NA 141   < > 140 142 141  K 4.3   < > 4.6 4.6 4.5  CL 109   < > 108 110 109  CO2 20*   < > _0 BUN 22   < > _1 CREATININE 1.54*   <  > 1.30* 1.13* 1.02*  CALCIUM 9.3   < > 8.9 9.0 8.8*  PROT 6.9  --   --   --   --   BILITOT 1.0  --   --   --   --   ALKPHOS 47  --   --   --   --   ALT 13  --   --   --   --   AST 29  --   --   --   --   GLUCOSE 95   < > 95 91 91   < > = values in this interval not displayed.     Imaging/Diagnostic Tests: 08/05/2019- TEE IMPRESSIONS    1. There is a 1.1 x 0.7 cm mass attached to the P2 segment of the PMVL.  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 There is no  significant regurgitation of the mitral valve or destruction to suggest  this is a vegetation. This is a native valve  so this is unlikely to represent thrombus. Would recommend blood cultures  to ensure there is no endocarditis. The mitral valve is abnormal. Trivial  mitral valve regurgitation. No evidence of mitral stenosis.  2. Agitated saline contrast bubble study was positive with shunting  observed within 3-6 cardiac cycles suggestive of  interatrial shunt. A  moderate size PFO is present with R to L shunting.  3. Left ventricular ejection fraction, by estimation, is 70 to 75%. The  left ventricle has hyperdynamic function. The left ventricle has no  regional wall motion abnormalities. There is the interventricular septum  is flattened in systole and diastole,  consistent with right ventricular pressure and volume overload.  4. Right ventricular systolic function is moderately reduced. The right  ventricular size is severely enlarged.  5. No left atrial/left atrial appendage thrombus was detected. The LAA  emptying velocity was 58 cm/s.  6. Right atrial size was moderately dilated.  7. The tricuspid valve is abnormal. Tricuspid valve regurgitation is  moderate to severe.  8. The aortic valve is tricuspid. Aortic valve regurgitation is mild.  Mild aortic valve sclerosis is present, with no evidence of aortic valve  stenosis.  9. There is Moderate (Grade III) layered plaque involving the  transverse  aorta and descending aorta.   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 Moderate PFO. Severely dilated RV with moderately  reduced function consistent with pulmonary hypertension.   FINDINGS  Left Ventricle: Left ventricular ejection fraction, by estimation, is 70  to 75%. The left ventricle has hyperdynamic function. The left ventricle  has no regional wall motion abnormalities. The left ventricular internal  cavity size was small. There is no  left ventricular hypertrophy. The interventricular septum is flattened in  systole and diastole, consistent with right ventricular pressure and  volume overload.   Right Ventricle: The right ventricular size is severely enlarged. No  increase in right ventricular wall thickness. Right ventricular systolic  function is moderately reduced.   Left Atrium: Left atrial size was normal in size. No left atrial/left  atrial appendage thrombus was detected. The LAA emptying velocity was 58  cm/s.   Right Atrium: Right atrial size was moderately dilated.   Pericardium: There is no evidence of pericardial effusion.   Mitral Valve: There is a 1.1 x 0.7 cm mass attached to the P2 segment of  the PMVL. 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 There is no  significant regurgitation of the mitral valve or destruction to suggest  this is a vegetation. This is a  native valve so this is unlikely to represent thrombus. Would recommend  blood cultures to ensure there is no endocarditis. The mitral valve is  abnormal. Trivial mitral valve regurgitation. No evidence of mitral valve  stenosis.   Tricuspid Valve: The tricuspid valve is abnormal. Tricuspid valve  regurgitation is moderate to severe. No evidence of tricuspid stenosis.   Aortic Valve: The aortic valve is tricuspid. . There is mild thickening  and mild calcification of the aortic valve. Aortic valve regurgitation is   mild. Mild aortic valve sclerosis is present, with no evidence of aortic  valve stenosis. There is mild  thickening of the aortic valve. There is mild calcification of the aortic  valve.   Pulmonic Valve: The pulmonic valve was grossly normal. Pulmonic valve  regurgitation is not visualized. No evidence of pulmonic stenosis.   Aorta: The aortic root and ascending aorta are structurally normal, with  no evidence of dilitation. There is moderate (Grade III) layered plaque  involving the transverse aorta and descending aorta.   IAS/Shunts: There is left bowing of the interatrial septum, suggestive of  elevated right atrial pressure. The atrial septum is grossly normal.  Agitated saline contrast was given intravenously to evaluate for  intracardiac shunting. Agitated saline  contrast bubble study was positive with shunting observed within 3-6  cardiac cycles suggestive of interatrial shunt. A moderate size PFO is  present with R to L shunting.   Freida Busman, MD 08/05/2019, 6:40 AM PGY-1, [place] Intern pager: [phone], text pages welcome

## 2019-08-05 NOTE — Progress Notes (Signed)
Right lower extremity U/S showing age-indeterminate peroneal DVT.  Plan to start anticoagulation with Eliquis X 3 months.  After discussion with Dr. Leonie Man, will discontinue antiplatelet therapy and restart ASA after completion of Eliquis.  She will follow up with neurology outpatient and discuss loop recorder at that time.  Additionally, will obtain CTA given known DVT and hypoxia requiring oxygen.  [name], DO

## 2019-08-05 NOTE — Progress Notes (Signed)
Occupational Therapy Treatment Patient Details Name: [name] MRN: [id number] DOB: 22-Oct-1945 Today's Date: 08/05/2019    History of present illness Pt is a 74 y/o female who presents with RUE weakness and decreased sensation. MRI revealed cerebellar and cortical/subcortical infarcts effecting the pre and post central gyri.  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    OT comments  Pt received supine in bed agreeable to OT intervention. Pt able to progress OOB with MIN A and complete x2 sit<>stands from EOB with hand held assist MIN - MOD A +2. Pt able to take side steps towards [facility] x2 with hand held MIN A +2 for balance. Pt continues to present with impaired balance, decreased activity tolerance and decreased ROM and strength in RUE impacting pts ability to complete BADLs. DC plan remains appropriate, will follow acutely per POC.   Follow Up Recommendations  SNF;Supervision/Assistance - 24 hour    Equipment Recommendations  3 in 1 bedside commode    Recommendations for Other Services      Precautions / Restrictions Precautions Precautions: Fall Restrictions Weight Bearing Restrictions: No       Mobility Bed Mobility Overal bed mobility: Needs Assistance Bed Mobility: Supine to Sit     Supine to sit: Min assist     General bed mobility comments: increased time and effort noted to scoot to EOB towards pts R side. pt required MIN A to scoot hips completely forward with use of bed pad. pt with limited use of RUE needing assist to elevate trunk  Transfers Overall transfer level: Needs assistance Equipment used: 2 person hand held assist Transfers: Sit to/from Stand Sit to Stand: Min assist;Mod assist;+2 physical assistance         General transfer comment: pt completed x2 sit<>stands from EOB needing MOD A initially progressing to MIN A +2. pt able to take side steps up to [facility] with MIN A +2 for safety d/t impaired balance.  pt reports feeling off balance needing increased support.    Balance Overall balance assessment: Needs assistance Sitting-balance support: Feet supported;No upper extremity supported Sitting balance-Leahy Scale: Fair     Standing balance support: Bilateral upper extremity supported;During functional activity Standing balance-Leahy Scale: Poor Standing balance comment: 2 HHA needed                           ADL either performed or assessed with clinical judgement   ADL Overall ADL's : Needs assistance/impaired                     Lower Body Dressing: Total assistance;Bed level Lower Body Dressing Details (indicate cue type and reason): to don socks Toilet Transfer: Moderate assistance;+2 for physical assistance;+2 for safety/equipment;RW;Ambulation;Minimal assistance Toilet Transfer Details (indicate cue type and reason): MIN- MOD A +2, simulated via functional mobility with no AD. MIN A to take steps to [facility] for balance and support, pt with pain in R shoulder unable to manage RW, MOD A to power into standing x2         Functional mobility during ADLs: Minimal assistance;+2 for physical assistance;Moderate assistance;+2 for safety/equipment General ADL Comments: pt limited by decreased activity tolerance and pain in R shoulder pt reports being able to self feed with L hand      Vision       Perception     Praxis      Cognition Arousal/Alertness: Awake/alert Behavior  During Therapy: WFL for tasks assessed/performed Overall Cognitive Status: Within Functional Limits for tasks assessed                                          Exercises Other Exercises Other Exercises: pt with decreased shoulder flexion in RUE < 90 *; full ROM in LUE, grip in RUE 2-/5 Other Exercises: sensation in RUE appears intact however increased pain all joints and with light touch on digits, pt reports RA at baseline   Shoulder Instructions       General  Comments pt on 2L during sesssion with O2 WFL    Pertinent Vitals/ Pain       Pain Assessment: Faces Faces Pain Scale: Hurts even more Pain Location: R shoulder Pain Descriptors / Indicators: Discomfort;Guarding;Grimacing;Shooting Pain Intervention(s): Monitored during session;Limited activity within patient's tolerance;Repositioned  Home Living                                          Prior Functioning/Environment              Frequency  Min 2X/week        Progress Toward Goals  OT Goals(current goals can now be found in the care plan section)  Progress towards OT goals: Progressing toward goals  Acute Rehab OT Goals Patient Stated Goal: None stated during session Time For Goal Achievement: 08/16/19 Potential to Achieve Goals: Good  Plan Discharge plan remains appropriate;Frequency remains appropriate    Co-evaluation                 AM-PAC OT "6 Clicks" Daily Activity     Outcome Measure   Help from another person eating meals?: A Little (opening items; pt reports eats with L hand) Help from another person taking care of personal grooming?: A Little Help from another person toileting, which includes using toliet, bedpan, or urinal?: A Lot Help from another person bathing (including washing, rinsing, drying)?: A Lot Help from another person to put on and taking off regular upper body clothing?: A Lot Help from another person to put on and taking off regular lower body clothing?: Total 6 Click Score: 13    End of Session Equipment Utilized During Treatment: Gait belt;Oxygen;Other (comment) (2L [place])  OT Visit Diagnosis: Unsteadiness on feet (R26.81);Muscle weakness (generalized) (M62.81);Other symptoms and signs involving the nervous system (R29.898);Pain Pain - Right/Left: Right Pain - part of body: Shoulder   Activity Tolerance Patient tolerated treatment well   Patient Left in bed;with call bell/phone within reach;with bed alarm  set   Nurse Communication Mobility status        Time: 8916-9450 OT Time Calculation (min): 21 min  Charges: OT General Charges $OT Visit: 1 Visit OT Treatments $Therapeutic Activity: 8-22 mins  Lanier Clam., COTA/L Acute Rehabilitation Services [phone] [place] 08/05/2019, 3:37 PM

## 2019-08-06 MED ORDER — METOPROLOL TARTRATE 25 MG PO TABS: 12.5000 mg | ORAL_TABLET | Freq: Two times a day (BID) | ORAL | 0 refills | Status: AC

## 2019-08-06 MED ORDER — APIXABAN 5 MG PO TABS: 5.0000 mg | ORAL_TABLET | Freq: Two times a day (BID) | ORAL | 0 refills | Status: AC

## 2019-08-06 MED ORDER — ROSUVASTATIN CALCIUM 20 MG PO TABS: 20.0000 mg | ORAL_TABLET | Freq: Every day | ORAL | 0 refills | Status: AC

## 2019-08-06 NOTE — Progress Notes (Signed)
. Family Medicine Teaching Service Daily Progress Note Intern Pager: [phone]  Patient name: [name] Medical record number: [id number] Date of birth: 1945/12/10 Age: 74 y.o. Gender: female  Primary Care Provider: Dixie Dials, MD Consultants: Cardiology, neurology Code Status: Full code  Pt Overview and Major Events to Date:  Admitted with acute CVA on 08/02/2019 Echo and carotid vascular US performed on 08/03/2019 TEE performed on 08/04/2019  Assessment and Plan:  [name] is a 74 y.o. female presenting with right hand weakness. PMH is significant for hyperlipidemia, hypertension, anemia, and rheumatoid arthritis.  Acute CVA Stable. Patient states that she has been using her right hand more but is still having trouble grasping things. She exhibits full wrist range of motion on the right and no pain was elicited during the exam. Patient admits to occasional numbness of her right hand. CT and MRI imaging in ED was limited by motion artifact, howeverMR brain w/ocontrast showed small acute cortical/subcortical infarct affecting the left pre and postcentral gyri, additional subcentimeter acute infarct within the right cerebellum.Patient received Carotid US which did not show significant abnormalities. Patient received echo, EF 60-65% with mild left ventricular hypertrophy, right ventricle is moderately enlarged, severely elevated PA systolic pressure, RA was mildly dilated, and mild aortic regurgitation. Echo was unable to r/o vegetation and it is suggested TEE to further assess.No known history of A fib/flutter and not present on EKG. Neurology recommends a loop recorder prior to discharge.RF including age, HLD, HTN, and RA. Clopidogrel 300 mg p.o. 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 Patient also had a severely dilated RV with severely reduced RV function and moderate to severe TR. Due to PFO finding lower extremity venous doppler was ordered.Lower extremity venous doppler demonstrated findings consistent with age indeterminant DVT involving the right peroneal veins and no cystic structure found in the popliteal fossa bilaterally.  No evidence of DVT in the left lower extremity noted. -Monitor telemetry  -Awaiting blood culture results -Hgb A1c at admission was 5.7 -ASA + plavixfor 3 weeks followed by ASA alone -Lower extremity venous doppler demonstrated presence of DVT involving the right peroneal veins. -Continue Crestor -Continue Eliquis 5 mg -Monitor patient on pulse ox -Ambulate with pulse ox -Monitor O2 requirements  recommendations -PT/OT recommends 3 in 1 bedside commode -Frequent neuro checks -Fall precautions, up with assistance -Monitor vitals -Per PT/OT and social work recommendations, patient to be discharged to [facility] -Neurology recommends loop recorder placement, will have this placed in the outpatient setting -At discharge:   -Patient to continue on Eliquis   -Strongly recommended to follow up as outpatient with neurology and cardiology to disucss loop recorder placement  Hypertension Chronic and stable.  BP today is 148/56. Patient takes metoprolol 60m BID at home.Patient had a previous pressure with systolic 1638 however this was around the time patient was in her TEE procedure, all other sys <160. We will continue to hold metoprolol at this time as patient seems to be able to maintain sys<180.  To date, patient's systolic blood pressures have ranged between 120 and  180. - Continue to hold metoprolol -Continue to monitor vitals, especially BP and make changes accordingly  CKD Stage II Stable. Creatinine 1.54on admit. Will continue to monitor to assess if patient has had progression, at this time it does not appears so as patient has had a downward trend since admission. Most recent creatinine was 1.02 from 08/05/2019. It is continuing to trend downward .HgbA1c at this admission is 5.7. -Continue to monitor BMP  -Consider following up with PCP regarding lifestyle modifications and preventative measures to maintain healthy Hgb A1c and BP to contribute to better CKD management.  Rheumatoid arthritis  Chronic and stable.  Patient takes Etanercept weekly (every Friday), plaquenil, and leflunomide which has helped to maintain her RA. Additionally on chronic prednisone 26m.  Regularly follows up with rheumatology outpatient. -Continue home meds  -ContinueProtonix257m-Encouraged to continue to follow-up with rheumatology outpatient   Hyperlipidemia Chronic and stable.  Most recent lipid panel was on admission and demonstrated a HDL of 19 and the remaining components within normal limits -Crestor 2035migh intensity secondary prevention - Continue Crestor  -Follow-up with PCP after discharge for continued hyperlipidemia management  Chronic pain syndrome Stable.  Patient denies any pain today. UDS positive only for opiates.Takes norco PRN at home, PMP aware reviewed and appropriate. #90 filled on 6/14.  -Continue on current home regimen  Hypothyroidism Chronic and stable.  Patient admits to cold intolerance but denies any other  associated symptoms and on home regimen levothyroxine. -Continue home levothyroxine 50 mcg daily.  FEN/GI: Regular diet PPx: Lovenox  Disposition: Awaiting SNF  Subjective:  Patient seen and examined at bedside. She is very pleasant. Patient was coughing a lot this morning and states that this occurs multiple  times throughout the day occasionally. When she coughs, brown sputum comes out. She admits to occasional dizziness when taken to get procedures done but none noted at this time. She denies chest pain, dyspnea, generalized or localized pain, weakness, vision changes and tingling. She admits to intermittent numbness of her right hand. Per nurse, the patient stays cold and has tremors in the right hand although I did not notice any while in the room. Patient has no new complaints at this time.   Objective: Temp:  [97.4 F (36.3 C)-99.1 F (37.3 C)] 98.6 F (37 C) (07/09 2322) Pulse Rate:  [72-86] 75 (07/09 2322) Resp:  [16-22] 17 (07/09 2322) BP: (125-161)/(52-70) 138/70 (07/09 2322) SpO2:  [80 %-100 %] 94 % (07/09 2322) Physical Exam: General: Patient resting comfortably in bed, in no acute distress.  Cardiovascular: regular rate and rhythm, no murmurs or gallops noted  Respiratory: lungs clear to auscultation bilaterally, no wheezing or rhonchi noted Abdomen: soft, tender upon palpation of the RLQ, colostomy bag in place  Extremities: 2+ pitting lower extremity edema bilaterally.  Neuro: AOx3, CN2-12 grossly intact, 3/5 UE strength on right and 5/5 UE strength on left, sensation grossly intact Psych: mood appropriate, no agitation noted  Derm: Skin cool to touch  Laboratory: Recent Labs  Lab 08/01/19 1349 08/01/19 1349 08/01/19 1358 08/04/19 0416 08/05/19 0520  WBC 8.1  --   --  6.0 8.6  HGB 11.4*   < > 12.6 9.4* 9.1*  HCT 41.0   < > 37.0 32.9* 31.5*  PLT 280  --   --  236 248   < > = values in this interval not displayed.   Recent Labs  Lab 08/01/19 1349 08/01/19 1358 08/03/19 0502 08/04/19 0416 08/05/19 0520  NA 141   < >  140 142 141  K 4.3   < > 4.6 4.6 4.5  CL 109   < > 108 110 109  CO2 20*   < > _0 BUN 22   < > _1 CREATININE 1.54*   < > 1.30* 1.13* 1.02*  CALCIUM 9.3   < > 8.9 9.0 8.8*  PROT 6.9  --   --   --   --   BILITOT 1.0  --   --   --   --    ALKPHOS 47  --   --   --   --   ALT 13  --   --   --   --   AST 29  --   --   --   --   GLUCOSE 95   < > 95 91 91   < > = values in this interval not displayed.      Imaging/Diagnostic Tests: CT ANGIO CHEST PE W OR WO CONTRAST  Result Date: 08/05/2019 CLINICAL DATA:  Hypoxia. Shortness of breath. Known DVT. EXAM: CT ANGIOGRAPHY CHEST WITH CONTRAST TECHNIQUE: Multidetector CT imaging of the chest was performed using the standard protocol during bolus administration of intravenous contrast. Multiplanar CT image reconstructions and MIPs were obtained to evaluate the vascular anatomy. CONTRAST:  39m OMNIPAQUE IOHEXOL 350 MG/ML SOLN COMPARISON:  No recent chest imaging. FINDINGS: Cardiovascular: There are no filling defects within the pulmonary arteries to suggest pulmonary embolus. Dilated central pulmonary artery at 3.8 cm. Aortic atherosclerosis without dissection or aneurysm. Coronary artery calcifications. Borderline cardiomegaly. No pericardial effusion. Mediastinum/Nodes: Shotty mediastinal and hilar lymph nodes. Largest node is in the subcarinal station measuring 15 mm. Upper esophagus is mildly patulous. Small hiatal hernia. No thyroid nodule. Lungs/Pleura: Small right pleural effusion. Patchy ground-glass opacities throughout the right lower lobe and to a lesser extent right upper lobe with suggestion of nodular septal thickening. Ill-defined ground-glass opacities in the lingula and dependent left lower lobe. Expiratory imaging limits assessment of the tracheobronchial tree. Questionable right lower lobe bronchial thickening with mucous plugging. There is no dominant pulmonary mass. Upper Abdomen: Mild elevation of right hemidiaphragm. No acute findings in the upper abdomen. Musculoskeletal: Multilevel degenerative change in the spine. There are no acute or suspicious osseous abnormalities. Review of the MIP images confirms the above findings. IMPRESSION: 1. No pulmonary embolus. 2. Small right  pleural effusion. Patchy ground-glass opacities throughout the right lower lobe and to a lesser extent right upper lobe with suggestion of nodular septal thickening. There also patchy ground-glass opacities in the lingula and left lower lobe. Findings are nonspecific and differential considerations include pneumonia, interstitial lung disease, or less likely asymmetric pulmonary edema. Septal thickening appears slightly nodular which raises concern for malignancy, however no dominant pulmonary nodule or mass is seen. 3. Questionable right lower lobe bronchial thickening with mucous plugging. 4. Shotty mediastinal and hilar lymph nodes are likely reactive. 5. Dilated central pulmonary artery suggesting pulmonary arterial hypertension. 6. Coronary artery calcifications. Aortic Atherosclerosis (ICD10-I70.0). Electronically Signed   By: MKeith RakeM.D.   On: 08/05/2019 17:00   VAS [place]OWER EXTREMITY VENOUS (DVT)  Result Date: 08/05/2019  Lower Venous DVTStudy Indications: Stroke.  Limitations: Body habitus and depth of vessels. Comparison Study: Prior LT LEV 04-01-2012 Performing Technologist: RDarlin Coco Examination Guidelines: A complete evaluation includes B-mode imaging, spectral Doppler, color Doppler, and power Doppler as needed of all accessible portions of each vessel. Bilateral testing is considered an  integral part of a complete examination. Limited examinations for reoccurring indications may be performed as noted. The reflux portion of the exam is performed with the patient in reverse Trendelenburg.  +---------+-----------------+---------+-----------+----------+-----------------+ RIGHT    Compressibility  PhasicitySpontaneityPropertiesThrombus Aging    +---------+-----------------+---------+-----------+----------+-----------------+ CFV      Full             Yes      Yes                                    +---------+-----------------+---------+-----------+----------+-----------------+  SFJ      Full                                                             +---------+-----------------+---------+-----------+----------+-----------------+ FV Prox  Full                                                             +---------+-----------------+---------+-----------+----------+-----------------+ FV Mid   Full                                                             +---------+-----------------+---------+-----------+----------+-----------------+ FV DistalFull             Yes      Yes                                    +---------+-----------------+---------+-----------+----------+-----------------+ PFV      Full                                                             +---------+-----------------+---------+-----------+----------+-----------------+ POP      Full             Yes      Yes                                    +---------+-----------------+---------+-----------+----------+-----------------+ PTV      Full                                                             +---------+-----------------+---------+-----------+----------+-----------------+ PERO     None in a single  Age Indeterminate          Pero V                                                           +---------+-----------------+---------+-----------+----------+-----------------+   +---------+---------------+---------+-----------+----------+--------------+ LEFT     CompressibilityPhasicitySpontaneityPropertiesThrombus Aging +---------+---------------+---------+-----------+----------+--------------+ CFV      Full           Yes      Yes                                 +---------+---------------+---------+-----------+----------+--------------+ SFJ      Full                                                        +---------+---------------+---------+-----------+----------+--------------+ FV Prox  Full                                                         +---------+---------------+---------+-----------+----------+--------------+ FV Mid   Full                                                        +---------+---------------+---------+-----------+----------+--------------+ FV DistalFull           Yes      Yes                                 +---------+---------------+---------+-----------+----------+--------------+ PFV      Full                                                        +---------+---------------+---------+-----------+----------+--------------+ POP      Full           Yes      Yes                                 +---------+---------------+---------+-----------+----------+--------------+ PTV      Full                                                        +---------+---------------+---------+-----------+----------+--------------+ PERO  Not visualized +---------+---------------+---------+-----------+----------+--------------+     Summary: RIGHT: - Findings consistent with age indeterminate deep vein thrombosis involving the right peroneal veins. - No cystic structure found in the popliteal fossa.  LEFT: - There is no evidence of deep vein thrombosis in the lower extremity. However, portions of this examination were limited- see technologist comments above.  - No cystic structure found in the popliteal fossa.  *See table(s) above for measurements and observations. Electronically signed by Harold Barban MD on 08/05/2019 at 5:18:04 PM.    Final     Donney Dice, DO 08/06/2019, 1:25 AM PGY-1, [place] Intern pager: [phone], text pages welcome

## 2019-08-06 NOTE — TOC Transition Note (Signed)
Transition of Care [facility]) - CM/SW Discharge Note   Patient Details  Name: [name] MRN: [id number] Date of Birth: 1945/05/29  [facility] CM/SW Contact:  Emeterio Reeve, [place] Phone Number: 08/06/2019, 1:39 PM   Clinical Narrative:     Pt is going to [facility] via [facility]tar. Pt and pts daughter [name] has been notified.  Nurse to call report to [phone]  Final next level of care: Skilled Nursing Facility Barriers to Discharge: Barriers Resolved   Patient Goals and CMS Choice   CMS Medicare.gov Compare Post Acute Care list provided to:: Patient Choice offered to / list presented to : Patient  Discharge Placement              Patient chooses bed at:  ([facility]) Patient to be transferred to facility by: ptar Name of family member notified: [name], daughter [phone] Patient and family notified of of transfer: 08/06/19  Discharge Plan and Services In-house Referral: Clinical Social Work Discharge Planning Services: [employer] Consult Post Acute Care Choice: [facility]                               Social Determinants of Health (SDOH) Interventions     Readmission Risk Interventions No flowsheet data found.  Emeterio Reeve, Latanya Presser, [facility] Social Worker [phone]

## 2019-08-06 NOTE — Progress Notes (Addendum)
I received a positive blood culture result in one of her bottles after she have been discharged from the hospital. Culture was previously reported as negative in >48 hours. I called Microbiology lab to clarrify if this is a contaminant or not. According to [facility]) they will not be able to tell till tomorrow. I called to report this to her nurse at [facility]. However, I was asked to call later.  I will follow up on result and reach out to ID regarding management. She might need to return to the hospital if confirmed real infection.  ..................................................... 5:27 PM. I was able to speak with the patient's nurse/Carol Alford Highland regarding her preliminary blood culture report. They will be able to transfer the patient back to the hospital tomorrow if needed.  Multiple attempts made to reach the patient to discuss report with her were unsuccessful.

## 2019-08-06 NOTE — Discharge Instructions (Signed)
Dear [name],   Thank you so much for allowing [place] to be part of your care!  You were admitted to [facility] for right arm weakness from a new stroke.  Fortunately your symptoms have improved.  You will need to follow-up with neurology outpatient for your stroke.  Additionally, while you were here we found a clot in your lower leg vein.  You will be on Eliquis, blood thinner, for at least the next 3 months to treat this.  We also have replaced referrals to cardiology and pulmonology for you at discharge, this is to follow-up your lung disease and heart function.   POST-HOSPITAL & CARE INSTRUCTIONS 1. Please let PCP/Specialists know of any changes that were made.  2. Please see medications section of this packet for any medication changes.    Take care and be well!  [facility]  [place], [place] [address] [phone]

## 2019-08-06 NOTE — Progress Notes (Signed)
Pt unable to ambulate.  However, pt's O2 sat on 2L O2 via [place] is 100%.  Removed O2 and waited approximately 15 minutes and pt has maintained O2 sat of 97% on RA.  Notified team

## 2019-08-06 NOTE — Progress Notes (Signed)
Called [facility] at this time to give report ([phone]) was left on hold for 7 minutes.  Will attempt to call back.

## 2019-08-06 NOTE — Progress Notes (Signed)
[facility] here to pick up pt.  Pt remains free of s/s of any acute distress.  Denies pain.

## 2019-08-06 NOTE — Progress Notes (Signed)
Receiving facility will not have pt's evening meds; clarified with team if it will be ok to give her metoprolol, crestor, hydroxychloroquine, and eliquis now and early.  Team ok'd this.  Will give prior to pt dc.

## 2019-08-06 NOTE — Plan of Care (Signed)
Pt alert and oriented.  MAE x 4 with mild weakness in RUE and RLE.  Unable to ambulate at this time.  RA satting 97%.  All VSS.  Tolerating diet and able to swallow without difficulty.  Voiding adequately.  No apparent s/s of any acute distress.  No c/o pain.

## 2019-08-06 NOTE — Discharge Summary (Signed)
[facility] Discharge Summary  Patient name: [name] Medical record number: [id number] Date of birth: Apr 19, 1945 Age: 74 y.o. 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 rate 18, height _0  (1.6 m), weight 130.6 kg, SpO2 97 %.  Physical exam below performed by Dr. Larae Grooms on day of d/c: General: Patient resting comfortably in bed, in no acute distress.  Cardiovascular: regular rate and rhythm, no murmurs or gallops noted  Respiratory: lungs clear to auscultation bilaterally, no wheezing or rhonchi noted Abdomen: soft, tender upon palpation of the RLQ, colostomy bag in place  Extremities: 2+ pitting lower extremity edema bilaterally.  Neuro: AOx3, CN2-12 grossly intact, 3/5 UE strength on right and 5/5 UE strength on left, sensation grossly intact Psych: mood appropriate, no agitation noted   Brief Hospital Course:  [name] is a 74 year old female who presented on 7/5 due to sudden onset weakness of her right hand.   Acute CVA  Right DVT: Code stroke was not called as she was out of TPA window.  CT head without acute intracranial bleed, MRI brain confirmed a small acute cortical/subcortical infarct in her left pre and post central gyri that would explain patient's findings, and additional acute infarct in her right  cerebellum.  Neurology was consulted and felt her CVA was embolic in nature.  Started ASA + Plavix and Crestor (increased from home simvastatin) with permissive hypertension.  MRA head/carotid U/S noncontributory. Echo performed showing an oscillating density on her MV, thus TEE was obtained to further assess. TEE showed a 1.1X 0.7 cm mass attached to MV without any significant regurgitation or destruction, suspected to be papillary fibroblastoma, and a patent PFO.  Neurology did not feel that this mass was related to her CVA and recommended bilateral DVT U/S given known PFO, found age-indeterminate right peroneal DVT.  Started on Eliquis 5 mg BID and discontinued DAPT, however recommend to restart ASA after anticoagulation in approximately 3 months.  Fortunately no atrial fibrillation noted on telemetry during stay, will discuss loop recorder with neurology on follow-up to rule out PAF.  She worked with PT/OT during stay who recommended SNF, had significant improvement in right hand function prior to discharge.  RV dysfunction with pulmonary hypertension  Suspected lung dx: Echo showing moderately reduced RV function with associated pulmonary hypertension.  She is a former smoker and denies known history of COPD or breathing discomfort.  These findings were also present on echo in 2019 and recommended follow-up with pulmonology for PFTs at that time, however this was not completed.  CTA performed on 7/9 to rule out contributory PE especially with known DVT as above, no PE seen, however did show bronchial thickening with mucous plugging.  At discharge, she was breathing comfortably on room air.  Referral placed to cardiology and pulmonology.  Issues for Follow Up:  1. Please continue to monitor residual deficits including predominant right hand weakness and mild right lower extremity weakness. 2. Referral placed to cardiology due to RV dysfunction,  PAH, and moderate-severe TR, please ensure she follows up  with cardiology. 3. Referral placed to pulmonology due to above and suspected COPD.  Recommend PFTs, please make sure she follows up with pulmonology. 4. She will continue Eliquis for at least 3 months for her DVT, should switch to aspirin only after completion. 5. She will need to follow-up with [facility] neurology in approximately 4 weeks, need to discuss loop recorder placement at that time to rule out paroxysmal atrial fibrillation contributing to embolic stroke. 6. Not felt to be a candidate for surgical intervention of papillary fibroblastoma given significant associated morbidity and does not need to be on anticoagulation for this, should be continually monitored.  Blood cultures negative >24 hours, do not suspect this is a vegetation. 7. Her home metoprolol was decreased to 12.5 BID at discharge with systolic BP ranging 449-675F, increase as appropriate.  Significant Procedures:  TEE on 7/8  Significant Labs and Imaging:  Recent Labs  Lab 08/01/19 1349 08/01/19 1349 08/01/19 1358 08/04/19 0416 08/05/19 0520  WBC 8.1  --   --  6.0 8.6  HGB 11.4*   < > 12.6 9.4* 9.1*  HCT 41.0   < > 37.0 32.9* 31.5*  PLT 280  --   --  236 248   < > = values in this interval not displayed.   Recent Labs  Lab 08/01/19 1349 08/01/19 1349 08/01/19 1358 08/01/19 1358 08/02/19 0552 08/02/19 0552 08/03/19 0502 08/03/19 0502 08/04/19 0416 08/05/19 0520  NA 141   < > 144  --  141  --  140  --  142 141  K 4.3   < > 4.3   < > 4.6   < > 4.6   < > 4.6 4.5  CL 109   < > 107  --  110  --  108  --  110 109  CO2 20*  --   --   --  22  --  23  --  25 26  GLUCOSE 95   < > 95  --  71  --  95  --  91 91  BUN 22   < > 24*  --  19  --  23  --  20 16  CREATININE 1.54*   < > 1.40*  --  1.16*  --  1.30*  --  1.13* 1.02*  CALCIUM 9.3  --   --   --  8.9  --  8.9  --  9.0 8.8*  ALKPHOS 47  --   --   --   --   --   --   --   --   --   AST 29  --   --   --   --   --   --   --   --   --   ALT 13  --   --   --   --   --    --   --   --   --   ALBUMIN 2.8*  --   --   --   --   --   --   --   --   --    < > = values in this interval not displayed.   CT HEAD WO CONTRAST  Result Date: 08/01/2019 CLINICAL DATA:  Woke up with right hand weakness EXAM: CT HEAD WITHOUT CONTRAST CT CERVICAL SPINE WITHOUT CONTRAST TECHNIQUE: Multidetector CT imaging of the head and cervical spine was  performed following the standard protocol without intravenous contrast. Multiplanar CT image reconstructions of the cervical spine were also generated. COMPARISON:  None. FINDINGS: CT HEAD FINDINGS Brain: No evidence of acute infarction, hemorrhage, hydrocephalus, extra-axial collection or mass lesion/mass effect. Periventricular and deep white matter hypodensity. Vascular: No hyperdense vessel or unexpected calcification. Skull: Normal. Negative for fracture or focal lesion. Sinuses/Orbits: No acute finding. Other: None. CT CERVICAL SPINE FINDINGS Examination of the cervical spine is significantly limited by motion artifact. Alignment: Normal. Skull base and vertebrae: No acute fracture. No primary bone lesion or focal pathologic process. Soft tissues and spinal canal: No prevertebral fluid or swelling. No visible canal hematoma. Disc levels: Mild to moderate disc space height loss and osteophytosis of the lower cervical levels, worst at C5 through C7. Upper chest: Negative. Other: None. IMPRESSION: 1. No acute intracranial pathology. Small-vessel white matter disease. Consider MRI to further evaluate for acute diffusion restricting infarction if suspected based on clinical signs and symptoms. 2. Examination of the cervical spine is significantly limited by motion artifact. Within this limitation, no obvious fracture or static subluxation of the cervical spine. 3. Mild to moderate disc space height loss and osteophytosis of the lower cervical levels, worst at C5 through C7. Electronically Signed   By: Eddie Candle M.D.   On: 08/01/2019 15:30   CT Cervical  Spine Wo Contrast  Result Date: 08/01/2019 CLINICAL DATA:  Woke up with right hand weakness EXAM: CT HEAD WITHOUT CONTRAST CT CERVICAL SPINE WITHOUT CONTRAST TECHNIQUE: Multidetector CT imaging of the head and cervical spine was performed following the standard protocol without intravenous contrast. Multiplanar CT image reconstructions of the cervical spine were also generated. COMPARISON:  None. FINDINGS: CT HEAD FINDINGS Brain: No evidence of acute infarction, hemorrhage, hydrocephalus, extra-axial collection or mass lesion/mass effect. Periventricular and deep white matter hypodensity. Vascular: No hyperdense vessel or unexpected calcification. Skull: Normal. Negative for fracture or focal lesion. Sinuses/Orbits: No acute finding. Other: None. CT CERVICAL SPINE FINDINGS Examination of the cervical spine is significantly limited by motion artifact. Alignment: Normal. Skull base and vertebrae: No acute fracture. No primary bone lesion or focal pathologic process. Soft tissues and spinal canal: No prevertebral fluid or swelling. No visible canal hematoma. Disc levels: Mild to moderate disc space height loss and osteophytosis of the lower cervical levels, worst at C5 through C7. Upper chest: Negative. Other: None. IMPRESSION: 1. No acute intracranial pathology. Small-vessel white matter disease. Consider MRI to further evaluate for acute diffusion restricting infarction if suspected based on clinical signs and symptoms. 2. Examination of the cervical spine is significantly limited by motion artifact. Within this limitation, no obvious fracture or static subluxation of the cervical spine. 3. Mild to moderate disc space height loss and osteophytosis of the lower cervical levels, worst at C5 through C7. Electronically Signed   By: Eddie Candle M.D.   On: 08/01/2019 15:30   MR ANGIO HEAD WO CONTRAST  Result Date: 08/01/2019 CLINICAL DATA:  Follow-up examination for acute stroke. EXAM: MRA HEAD WITHOUT CONTRAST  TECHNIQUE: Angiographic images of the Circle of Willis were obtained using MRA technique without intravenous contrast. COMPARISON:  Prior MRI from earlier the same day. FINDINGS: ANTERIOR CIRCULATION: Examination moderately to severely degraded by motion artifact. Visualized distal cervical segments of the internal carotid arteries are patent with antegrade flow. Petrous, cavernous, and supraclinoid ICAs patent without obvious flow-limiting stenosis or other abnormality. A1 segments patent bilaterally. Grossly normal anterior communicating artery complex. Anterior cerebral arteries patent to their distal aspects  without stenosis. No M1 stenosis or occlusion. Grossly normal MCA bifurcations. Distal MCA branches well perfused and grossly symmetric. POSTERIOR CIRCULATION: Dominant left vertebral artery grossly patent to the vertebrobasilar junction without appreciable stenosis. Left PICA patent proximally. Right vertebral artery diffusely hypoplastic, and is not well seen as it courses into the skull base, and may be partially occluded. Flow related signal is seen within the right V4 segment distally, which could be partially collateral in nature. Right PICA not well seen. Basilar grossly patent to its distal aspect without stenosis. Superior cerebral arteries patent proximally. Both PCAs primarily supplied via the basilar, although small bilateral posterior communicating arteries noted. PCAs grossly perfused to their distal aspects without stenosis. No appreciable intracranial aneurysm. IMPRESSION: 1. Technically limited exam due to extensive motion artifact. 2. Hypoplastic right vertebral artery, nonvisualized as it courses into the cranial vault, and could be occluded. Flow related signal seen within the right V4 segment distally, which could be collateral nature. Right PICA not definitely seen. Dominant left vertebral artery widely patent. 3. Otherwise gross patency of the major arterial intracranial arterial  circulation. No other large vessel occlusion. No definite hemodynamically significant or correctable stenosis. Electronically Signed   By: Jeannine Boga M.D.   On: 08/01/2019 20:50   MR BRAIN WO CONTRAST  Result Date: 08/01/2019 CLINICAL DATA:  Right hand weakness. EXAM: MRI HEAD WITHOUT CONTRAST TECHNIQUE: Multiplanar, multiecho pulse sequences of the brain and surrounding structures were obtained without intravenous contrast. COMPARISON:  Noncontrast head CT performed earlier the same day 08/01/2019. FINDINGS: Brain: The patient was unable to tolerate the full examination. As a result, only axial and coronal diffusion-weighted sequences as well as a sagittal T1 weighted sequence could be obtained. There is moderate/severe motion degradation of the sagittal T1 weighted sequence. There is a small focus of cortical/subcortical restricted diffusion affecting the left pre and postcentral gyri consistent with acute infarction (for instance as seen on series 2, image 37). These findings would account for the reported right hand weakness. Additional small acute infarct within the right cerebellum (series 2, image 13) Vascular: Poorly assessed on the acquired sequences. Skull and upper cervical spine: Focal marrow lesion identified on the acquired sequences. Sinuses/Orbits: Poorly assessed on the acquired sequences. IMPRESSION: Prematurely terminated, motion degraded and limited examination as described. Small acute cortical/subcortical infarct affecting the left pre- and postcentral gyri. This finding would account for the patient's reported right hand weakness. Additional subcentimeter acute infarct within the right cerebellum. Electronically Signed   By: Kellie Simmering DO   On: 08/01/2019 17:10   ECHOCARDIOGRAM COMPLETE  Result Date: 08/02/2019    ECHOCARDIOGRAM REPORT   Patient Name:   RISHA BARRETTA Bathe Date of Exam: 08/02/2019 Medical Rec #:  [id number]    Height:       63.0 in Accession #:    [id number]   Weight:        282.6 lb Date of Birth:  26-Dec-1945    BSA:          2.240 m Patient Age:    44 years     BP:           139/69 mmHg Patient Gender: F            HR:           87 bpm. Exam Location:  Inpatient Procedure: 2D Echo, Cardiac Doppler and Color Doppler Indications:    Stroke  History:        Patient has prior history of  Echocardiogram examinations, most                 recent 07/01/2017. Stroke and TIA; Signs/Symptoms:Dyspnea.  Sonographer:    Roseanna Rainbow RDCS Referring Phys: Francis Dowse Gwyndolyn Saxon A HENSEL  Sonographer Comments: Patient is morbidly obese and Technically difficult study due to poor echo windows. Image acquisition challenging due to patient body habitus. IMPRESSIONS  1. Vigorous LV systolic function; grade 1 diastolic dysfunction; mild LVH; D shaped septum suggesting pulmonary hypertension; mildly elevated velocity across aortic valve suggesting mild AS but partially explained by vigorous LV function; mild AI; mild RAE; moderate RVE with severe RV dysfunction; moderate TR with severe pulmonary hypertension. There is an oscillating density on MV; cannot R/O vegetation; suggest TEE to further assess.  2. Left ventricular ejection fraction, by estimation, is 60 to 65%. The left ventricle has normal function. The left ventricle has no regional wall motion abnormalities. There is mild left ventricular hypertrophy. Left ventricular diastolic parameters are consistent with Grade I diastolic dysfunction (impaired relaxation).  3. Right ventricular systolic function is severely reduced. The right ventricular size is moderately enlarged. There is severely elevated pulmonary artery systolic pressure.  4. Right atrial size was mildly dilated.  5. The mitral valve is normal in structure. No evidence of mitral valve regurgitation. No evidence of mitral stenosis.  6. Tricuspid valve regurgitation is moderate.  7. The aortic valve is tricuspid. Aortic valve regurgitation is mild. Mild aortic valve stenosis.  8. The inferior vena  cava is normal in size with <50% respiratory variability, suggesting right atrial pressure of 8 mmHg. FINDINGS  Left Ventricle: Left ventricular ejection fraction, by estimation, is 60 to 65%. The left ventricle has normal function. The left ventricle has no regional wall motion abnormalities. The left ventricular internal cavity size was normal in size. There is  mild left ventricular hypertrophy. Left ventricular diastolic parameters are consistent with Grade I diastolic dysfunction (impaired relaxation). Right Ventricle: The right ventricular size is moderately enlarged.Right ventricular systolic function is severely reduced. There is severely elevated pulmonary artery systolic pressure. The tricuspid regurgitant velocity is 4.66 m/s, and with an assumed  right atrial pressure of 8 mmHg, the estimated right ventricular systolic pressure is 10.2 mmHg. Left Atrium: Left atrial size was normal in size. Right Atrium: Right atrial size was mildly dilated. Pericardium: There is no evidence of pericardial effusion. Mitral Valve: The mitral valve is normal in structure. Normal mobility of the mitral valve leaflets. Mild mitral annular calcification. No evidence of mitral valve regurgitation. No evidence of mitral valve stenosis. MV peak gradient, 9.5 mmHg. The mean mitral valve gradient is 2.5 mmHg. Tricuspid Valve: The tricuspid valve is normal in structure. Tricuspid valve regurgitation is moderate . No evidence of tricuspid stenosis. Aortic Valve: The aortic valve is tricuspid. Aortic valve regurgitation is mild. Mild aortic stenosis is present. Aortic valve mean gradient measures 10.5 mmHg. Aortic valve peak gradient measures 22.9 mmHg. Aortic valve area, by VTI measures 1.92 cm. Pulmonic Valve: The pulmonic valve was normal in structure. Pulmonic valve regurgitation is not visualized. No evidence of pulmonic stenosis. Aorta: The aortic root is normal in size and structure. Venous: The inferior vena cava is normal  in size with less than 50% respiratory variability, suggesting right atrial pressure of 8 mmHg. IAS/Shunts: No atrial level shunt detected by color flow Doppler. Additional Comments: Vigorous LV systolic function; grade 1 diastolic dysfunction; mild LVH; D shaped septum suggesting pulmonary hypertension; mildly elevated velocity across aortic valve suggesting mild AS  but partially explained by vigorous LV function; mild AI; mild RAE; moderate RVE with severe RV dysfunction; moderate TR with severe pulmonary hypertension. There is an oscillating density on MV; cannot R/O vegetation; suggest TEE to further assess.  LEFT VENTRICLE PLAX 2D LVIDd:         3.59 cm     Diastology LVIDs:         2.02 cm     LV e' lateral:   7.51 cm/s LV PW:         1.34 cm     LV E/e' lateral: 10.8 LV IVS:        1.57 cm     LV e' medial:    5.44 cm/s LVOT diam:     2.30 cm     LV E/e' medial:  14.9 LV SV:         71 LV SV Index:   32 LVOT Area:     4.15 cm  LV Volumes (MOD) LV vol d, MOD A2C: 41.8 ml LV vol d, MOD A4C: 51.6 ml LV vol s, MOD A2C: 9.7 ml LV vol s, MOD A4C: 10.8 ml LV SV MOD A2C:     32.1 ml LV SV MOD A4C:     51.6 ml LV SV MOD BP:      36.1 ml RIGHT VENTRICLE             IVC RV S prime:     11.70 cm/s  IVC diam: 2.04 cm TAPSE (M-mode): 2.0 cm LEFT ATRIUM           Index       RIGHT ATRIUM           Index LA diam:      2.80 cm 1.25 cm/m  RA Area:     19.00 cm LA Vol (A2C): 57.3 ml 25.58 ml/m RA Volume:   59.20 ml  26.43 ml/m LA Vol (A4C): 28.1 ml 12.55 ml/m  AORTIC VALVE AV Area (Vmax):    1.89 cm AV Area (Vmean):   1.96 cm AV Area (VTI):     1.92 cm AV Vmax:           239.50 cm/s AV Vmean:          150.500 cm/s AV VTI:            0.372 m AV Peak Grad:      22.9 mmHg AV Mean Grad:      10.5 mmHg LVOT Vmax:         109.00 cm/s LVOT Vmean:        71.000 cm/s LVOT VTI:          0.172 m LVOT/AV VTI ratio: 0.46  AORTA Ao Root diam: 3.60 cm Ao Asc diam:  3.20 cm MITRAL VALVE                TRICUSPID VALVE MV Area (PHT):  2.45 cm     TR Peak grad:   86.9 mmHg MV Peak grad:  9.5 mmHg     TR Vmax:        466.00 cm/s MV Mean grad:  2.5 mmHg MV Vmax:       1.54 m/s     SHUNTS MV Vmean:      69.4 cm/s    Systemic VTI:  0.17 m MV Decel Time: 310 msec     Systemic Diam: 2.30 cm MV E velocity: 81.00 cm/s MV A velocity: 119.00 cm/s MV E/A ratio:  0.68 Kirk Ruths MD Electronically signed by Kirk Ruths MD Signature Date/Time: 08/02/2019/4:07:26 PM    Final    VAS US CAROTID  Result Date: 08/02/2019 Carotid Arterial Duplex Study Indications:       CVA. Risk Factors:      Hypertension, hyperlipidemia. Comparison Study:  No prior studies. Performing Technologist: [name]  Examination Guidelines: A complete evaluation includes B-mode imaging, spectral Doppler, color Doppler, and power Doppler as needed of all accessible portions of each vessel. Bilateral testing is considered an integral part of a complete examination. Limited examinations for reoccurring indications may be performed as noted.  Right Carotid Findings: +----------+--------+--------+--------+------------------+------------------+           PSV cm/sEDV cm/sStenosisPlaque DescriptionComments           +----------+--------+--------+--------+------------------+------------------+ CCA Prox  87      13                                intimal thickening +----------+--------+--------+--------+------------------+------------------+ CCA Distal78      13              calcific                             +----------+--------+--------+--------+------------------+------------------+ ICA Prox  45      7                                                    +----------+--------+--------+--------+------------------+------------------+ ICA Distal59      10                                                   +----------+--------+--------+--------+------------------+------------------+ +----------+--------+-------+----------------+-------------------+            PSV cm/sEDV cmsDescribe        Arm Pressure (mmHG) +----------+--------+-------+----------------+-------------------+ [id number]             Multiphasic, WNL                    +----------+--------+-------+----------------+-------------------+ +---------+--------+--+--------+-+---------+ VertebralPSV cm/s58EDV cm/s9Antegrade +---------+--------+--+--------+-+---------+  Left Carotid Findings: +----------+--------+--------+--------+------------------+------------------+           PSV cm/sEDV cm/sStenosisPlaque DescriptionComments           +----------+--------+--------+--------+------------------+------------------+ CCA Prox  95      12                                intimal thickening +----------+--------+--------+--------+------------------+------------------+ CCA Distal70      10                                intimal thickening +----------+--------+--------+--------+------------------+------------------+ ICA Prox  64      12                                                   +----------+--------+--------+--------+------------------+------------------+ ICA Distal100  21                                                   +----------+--------+--------+--------+------------------+------------------+ ECA       47                                                           +----------+--------+--------+--------+------------------+------------------+ +----------+--------+--------+----------------+-------------------+           PSV cm/sEDV cm/sDescribe        Arm Pressure (mmHG) +----------+--------+--------+----------------+-------------------+ [id number]             Multiphasic, WNL                    +----------+--------+--------+----------------+-------------------+ +---------+--------+--+--------+--+---------+ VertebralPSV cm/s68EDV cm/s11Antegrade +---------+--------+--+--------+--+---------+   Summary: Right Carotid: The  extracranial vessels were near-normal with only minimal wall                thickening or plaque. Left Carotid: The extracranial vessels were near-normal with only minimal wall               thickening or plaque. Vertebrals:  Bilateral vertebral arteries demonstrate antegrade flow. Subclavians: Normal flow hemodynamics were seen in bilateral subclavian              arteries. *See table(s) above for measurements and observations.  Electronically signed by Deitra Mayo MD on 08/02/2019 at 11:00:22 PM.    Final    Results/Tests Pending at Time of Discharge: Blood cultures  Discharge Medications:  Allergies as of 08/06/2019   No Known Allergies     Medication List    STOP taking these medications   simvastatin 40 MG tablet Commonly known as: ZOCOR     TAKE these medications   apixaban 5 MG Tabs tablet Commonly known as: ELIQUIS Take 1 tablet (5 mg total) by mouth every 12 (twelve) hours.   cycloSPORINE 0.05 % ophthalmic emulsion Commonly known as: RESTASIS Place 1 drop into both eyes 3 (three) times daily as needed (dry eyes).   Enbrel 50 MG/ML injection Generic drug: etanercept Inject 50 mg into the skin every Friday.   folic acid 1 MG tablet Commonly known as: FOLVITE Take 1 mg by mouth daily.   HYDROcodone-acetaminophen 10-325 MG tablet Commonly known as: NORCO Take 1 tablet by mouth 3 (three) times daily as needed for severe pain.   hydroxychloroquine 200 MG tablet Commonly known as: PLAQUENIL Take 200 mg by mouth 2 (two) times daily.   leflunomide 10 MG tablet Commonly known as: ARAVA Take 10 mg by mouth daily.   levothyroxine 50 MCG tablet Commonly known as: SYNTHROID Take 50 mcg by mouth daily.   metoprolol tartrate 25 MG tablet Commonly known as: LOPRESSOR Take 0.5 tablets (12.5 mg total) by mouth 2 (two) times daily. What changed:   medication strength  how much to take   naproxen sodium 220 MG tablet Commonly known as: ALEVE Take 220 mg by mouth  daily as needed.   predniSONE 5 MG tablet Commonly known as: DELTASONE Take 5 mg by mouth daily.   rosuvastatin 20 MG tablet Commonly known as: CRESTOR Take 1 tablet (20 mg total) by mouth daily at  6 PM.       Discharge Instructions: Please refer to Patient Instructions section of EMR for full details.  Patient was counseled important signs and symptoms that should prompt return to medical care, changes in medications, dietary instructions, activity restrictions, and follow up appointments.   Follow-Up Appointments:  Contact information for follow-up providers    [facility]. Schedule an appointment as soon as possible for a visit.   Specialty: Neurology Why: Please see them in follow-up in the next 4 weeks. Contact information: [address] [place] [phone]       Sandi Mariscal, MD. Schedule an appointment as soon as possible for a visit.   Specialty: Internal Medicine Why: Please schedule hospital follow-up for when you were discharged from your facility. Contact information: [place] [address] [phone]            Contact information for after-discharge care    Destination    [facility] .   Service: Skilled Nursing Contact information: [place], [place], DO 08/06/2019, 12:41 PM PGY-3, [place]

## 2019-08-07 ENCOUNTER — Telehealth: Payer: Self-pay | Admitting: Family Medicine

## 2019-08-07 ENCOUNTER — Encounter (HOSPITAL_COMMUNITY): Payer: Self-pay | Admitting: Cardiovascular Disease

## 2019-08-07 NOTE — Telephone Encounter (Signed)
Blood culture Identification/ID still pending. ?? Contaminant.   I spoke with Camelia from micro and she said the ID request was placed today. We will follow-up tomorrow.

## 2019-08-08 ENCOUNTER — Emergency Department (HOSPITAL_BASED_OUTPATIENT_CLINIC_OR_DEPARTMENT_OTHER): Payer: Medicare Other

## 2019-08-08 ENCOUNTER — Emergency Department (HOSPITAL_COMMUNITY): Payer: Medicare Other

## 2019-08-08 ENCOUNTER — Observation Stay (HOSPITAL_COMMUNITY): Payer: Medicare Other

## 2019-08-08 ENCOUNTER — Encounter (HOSPITAL_COMMUNITY): Payer: Self-pay

## 2019-08-08 ENCOUNTER — Other Ambulatory Visit: Payer: Self-pay

## 2019-08-08 ENCOUNTER — Inpatient Hospital Stay (HOSPITAL_COMMUNITY)
Admission: EM | Admit: 2019-08-08 | Discharge: 2019-08-23 | DRG: 314 | Disposition: A | Discharge status: Skilled Nursing Facility | Payer: Medicare Other | Source: Skilled Nursing Facility | Attending: Family Medicine | Admitting: Family Medicine

## 2019-08-08 DIAGNOSIS — M7989 Other specified soft tissue disorders: Secondary | ICD-10-CM | POA: Diagnosis not present

## 2019-08-08 DIAGNOSIS — I2723 Pulmonary hypertension due to lung diseases and hypoxia: Principal | ICD-10-CM | POA: Diagnosis present

## 2019-08-08 DIAGNOSIS — I63412 Cerebral infarction due to embolism of left middle cerebral artery: Secondary | ICD-10-CM | POA: Diagnosis not present

## 2019-08-08 DIAGNOSIS — I1310 Hypertensive heart and chronic kidney disease without heart failure, with stage 1 through stage 4 chronic kidney disease, or unspecified chronic kidney disease: Secondary | ICD-10-CM | POA: Diagnosis present

## 2019-08-08 DIAGNOSIS — D649 Anemia, unspecified: Secondary | ICD-10-CM | POA: Diagnosis present

## 2019-08-08 DIAGNOSIS — Z7901 Long term (current) use of anticoagulants: Secondary | ICD-10-CM

## 2019-08-08 DIAGNOSIS — M069 Rheumatoid arthritis, unspecified: Secondary | ICD-10-CM | POA: Diagnosis present

## 2019-08-08 DIAGNOSIS — Z20822 Contact with and (suspected) exposure to covid-19: Secondary | ICD-10-CM | POA: Diagnosis present

## 2019-08-08 DIAGNOSIS — N179 Acute kidney failure, unspecified: Secondary | ICD-10-CM | POA: Diagnosis present

## 2019-08-08 DIAGNOSIS — I130 Hypertensive heart and chronic kidney disease with heart failure and stage 1 through stage 4 chronic kidney disease, or unspecified chronic kidney disease: Secondary | ICD-10-CM | POA: Diagnosis not present

## 2019-08-08 DIAGNOSIS — D151 Benign neoplasm of heart: Secondary | ICD-10-CM | POA: Diagnosis present

## 2019-08-08 DIAGNOSIS — G4733 Obstructive sleep apnea (adult) (pediatric): Secondary | ICD-10-CM | POA: Diagnosis present

## 2019-08-08 DIAGNOSIS — I69331 Monoplegia of upper limb following cerebral infarction affecting right dominant side: Secondary | ICD-10-CM

## 2019-08-08 DIAGNOSIS — Z79890 Hormone replacement therapy (postmenopausal): Secondary | ICD-10-CM

## 2019-08-08 DIAGNOSIS — R29810 Facial weakness: Secondary | ICD-10-CM | POA: Diagnosis not present

## 2019-08-08 DIAGNOSIS — R799 Abnormal finding of blood chemistry, unspecified: Secondary | ICD-10-CM

## 2019-08-08 DIAGNOSIS — Z933 Colostomy status: Secondary | ICD-10-CM

## 2019-08-08 DIAGNOSIS — L03116 Cellulitis of left lower limb: Secondary | ICD-10-CM | POA: Diagnosis present

## 2019-08-08 DIAGNOSIS — Z515 Encounter for palliative care: Secondary | ICD-10-CM

## 2019-08-08 DIAGNOSIS — Z79899 Other long term (current) drug therapy: Secondary | ICD-10-CM

## 2019-08-08 DIAGNOSIS — I639 Cerebral infarction, unspecified: Secondary | ICD-10-CM | POA: Diagnosis present

## 2019-08-08 DIAGNOSIS — R4701 Aphasia: Secondary | ICD-10-CM | POA: Diagnosis not present

## 2019-08-08 DIAGNOSIS — Z87891 Personal history of nicotine dependence: Secondary | ICD-10-CM

## 2019-08-08 DIAGNOSIS — E039 Hypothyroidism, unspecified: Secondary | ICD-10-CM | POA: Diagnosis present

## 2019-08-08 DIAGNOSIS — G894 Chronic pain syndrome: Secondary | ICD-10-CM | POA: Diagnosis present

## 2019-08-08 DIAGNOSIS — R482 Apraxia: Secondary | ICD-10-CM | POA: Diagnosis not present

## 2019-08-08 DIAGNOSIS — R0602 Shortness of breath: Secondary | ICD-10-CM

## 2019-08-08 DIAGNOSIS — Z9981 Dependence on supplemental oxygen: Secondary | ICD-10-CM

## 2019-08-08 DIAGNOSIS — R471 Dysarthria and anarthria: Secondary | ICD-10-CM | POA: Diagnosis not present

## 2019-08-08 DIAGNOSIS — J9601 Acute respiratory failure with hypoxia: Secondary | ICD-10-CM | POA: Diagnosis present

## 2019-08-08 DIAGNOSIS — R29714 NIHSS score 14: Secondary | ICD-10-CM | POA: Diagnosis not present

## 2019-08-08 DIAGNOSIS — H53461 Homonymous bilateral field defects, right side: Secondary | ICD-10-CM | POA: Diagnosis not present

## 2019-08-08 DIAGNOSIS — R05 Cough: Secondary | ICD-10-CM | POA: Diagnosis present

## 2019-08-08 DIAGNOSIS — D84821 Immunodeficiency due to drugs: Secondary | ICD-10-CM | POA: Diagnosis present

## 2019-08-08 DIAGNOSIS — Q211 Atrial septal defect: Secondary | ICD-10-CM

## 2019-08-08 DIAGNOSIS — I2781 Cor pulmonale (chronic): Secondary | ICD-10-CM | POA: Diagnosis present

## 2019-08-08 DIAGNOSIS — I2510 Atherosclerotic heart disease of native coronary artery without angina pectoris: Secondary | ICD-10-CM | POA: Diagnosis present

## 2019-08-08 DIAGNOSIS — R609 Edema, unspecified: Secondary | ICD-10-CM

## 2019-08-08 DIAGNOSIS — G8191 Hemiplegia, unspecified affecting right dominant side: Secondary | ICD-10-CM | POA: Diagnosis not present

## 2019-08-08 DIAGNOSIS — R270 Ataxia, unspecified: Secondary | ICD-10-CM | POA: Diagnosis not present

## 2019-08-08 DIAGNOSIS — Z86718 Personal history of other venous thrombosis and embolism: Secondary | ICD-10-CM

## 2019-08-08 DIAGNOSIS — R0902 Hypoxemia: Secondary | ICD-10-CM | POA: Diagnosis present

## 2019-08-08 DIAGNOSIS — N182 Chronic kidney disease, stage 2 (mild): Secondary | ICD-10-CM | POA: Diagnosis present

## 2019-08-08 DIAGNOSIS — Z7189 Other specified counseling: Secondary | ICD-10-CM

## 2019-08-08 DIAGNOSIS — I700 Atherosclerosis of aorta: Secondary | ICD-10-CM | POA: Diagnosis present

## 2019-08-08 DIAGNOSIS — I5043 Acute on chronic combined systolic (congestive) and diastolic (congestive) heart failure: Secondary | ICD-10-CM

## 2019-08-08 DIAGNOSIS — R918 Other nonspecific abnormal finding of lung field: Secondary | ICD-10-CM

## 2019-08-08 DIAGNOSIS — Z6841 Body Mass Index (BMI) 40.0 and over, adult: Secondary | ICD-10-CM

## 2019-08-08 DIAGNOSIS — E785 Hyperlipidemia, unspecified: Secondary | ICD-10-CM | POA: Diagnosis present

## 2019-08-08 DIAGNOSIS — L8990 Pressure ulcer of unspecified site, unspecified stage: Secondary | ICD-10-CM | POA: Insufficient documentation

## 2019-08-08 DIAGNOSIS — Z96653 Presence of artificial knee joint, bilateral: Secondary | ICD-10-CM | POA: Diagnosis present

## 2019-08-08 LAB — CBC WITH DIFFERENTIAL/PLATELET
Abs Immature Granulocytes: 0.04 10*3/uL (ref 0.00–0.07)
Basophils Absolute: 0.1 10*3/uL (ref 0.0–0.1)
Basophils Relative: 1 %
Eosinophils Absolute: 0.1 10*3/uL (ref 0.0–0.5)
Eosinophils Relative: 1 %
HCT: 38.3 % (ref 36.0–46.0)
Hemoglobin: 11.3 g/dL — ABNORMAL LOW (ref 12.0–15.0)
Immature Granulocytes: 1 %
Lymphocytes Relative: 28 %
Lymphs Abs: 2 10*3/uL (ref 0.7–4.0)
MCH: 25 pg — ABNORMAL LOW (ref 26.0–34.0)
MCHC: 29.5 g/dL — ABNORMAL LOW (ref 30.0–36.0)
MCV: 84.7 fL (ref 80.0–100.0)
Monocytes Absolute: 0.7 10*3/uL (ref 0.1–1.0)
Monocytes Relative: 9 %
Neutro Abs: 4.3 10*3/uL (ref 1.7–7.7)
Neutrophils Relative %: 60 %
Platelets: 299 10*3/uL (ref 150–400)
RBC: 4.52 MIL/uL (ref 3.87–5.11)
RDW: 14.5 % (ref 11.5–15.5)
WBC: 7.1 10*3/uL (ref 4.0–10.5)
nRBC: 0 % (ref 0.0–0.2)

## 2019-08-08 LAB — CULTURE, BLOOD (ROUTINE X 2)

## 2019-08-08 LAB — BASIC METABOLIC PANEL
Anion gap: 8 (ref 5–15)
BUN: 15 mg/dL (ref 8–23)
CO2: 27 mmol/L (ref 22–32)
Calcium: 9.3 mg/dL (ref 8.9–10.3)
Chloride: 106 mmol/L (ref 98–111)
Creatinine, Ser: 1.18 mg/dL — ABNORMAL HIGH (ref 0.44–1.00)
GFR calc Af Amer: 53 mL/min — ABNORMAL LOW (ref >60)
GFR calc non Af Amer: 46 mL/min — ABNORMAL LOW (ref >60)
Glucose, Bld: 92 mg/dL (ref 70–99)
Potassium: 4.9 mmol/L (ref 3.5–5.1)
Sodium: 141 mmol/L (ref 135–145)

## 2019-08-08 LAB — SARS CORONAVIRUS 2 BY RT PCR (HOSPITAL ORDER, PERFORMED IN ~~LOC~~ HOSPITAL LAB): SARS Coronavirus 2: NEGATIVE

## 2019-08-08 LAB — BRAIN NATRIURETIC PEPTIDE: B Natriuretic Peptide: 1197.5 pg/mL — ABNORMAL HIGH (ref 0.0–100.0)

## 2019-08-08 IMAGING — CT CT ANGIO CHEST
2 of 7 series · 18 of 46 positions shown · IV contrast (APPLIED)
Comparison: [DATE]

CLINICAL DATA: Dyspnea on exertion

EXAM:
CT ANGIOGRAPHY CHEST WITH CONTRAST
TECHNIQUE: Multidetector CT imaging of the chest was performed using the
standard protocol during bolus administration of intravenous
contrast. Multiplanar CT image reconstructions and MIPs were
obtained to evaluate the vascular anatomy.
CONTRAST:  84mL OMNIPAQUE IOHEXOL 350 MG/ML SOLN

[Series 7: thins · axial · 0.61mm/px · z∈[-88,+150]mm · 15 of 383 slices shown]
[im 22/383  lung]
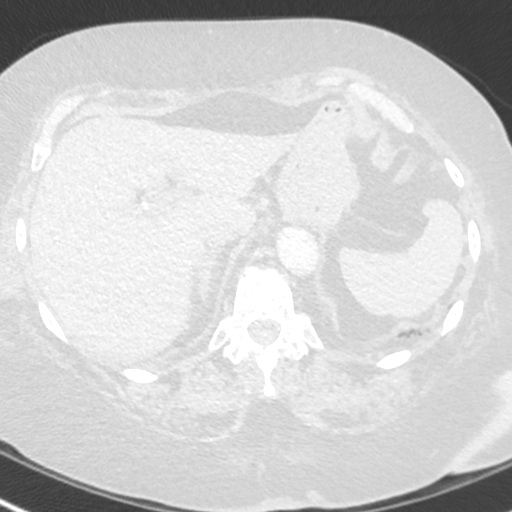
[im 43/383  soft-tissue]
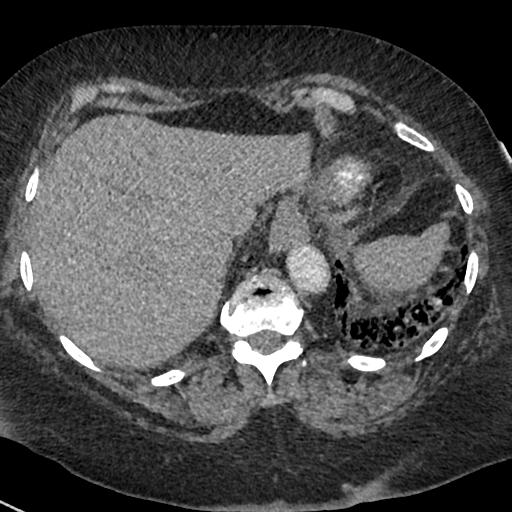
[im 64/383  lung]
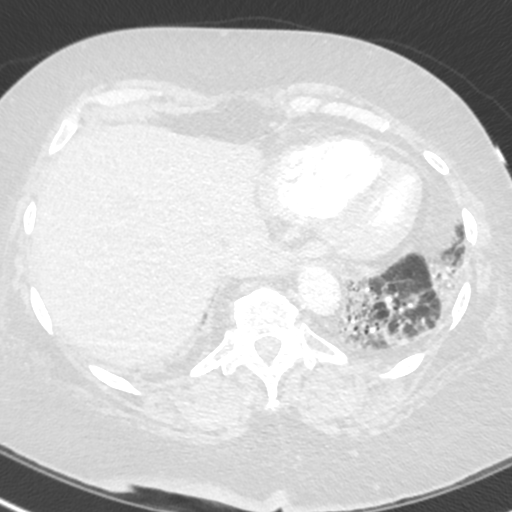
[im 85/383  soft-tissue]
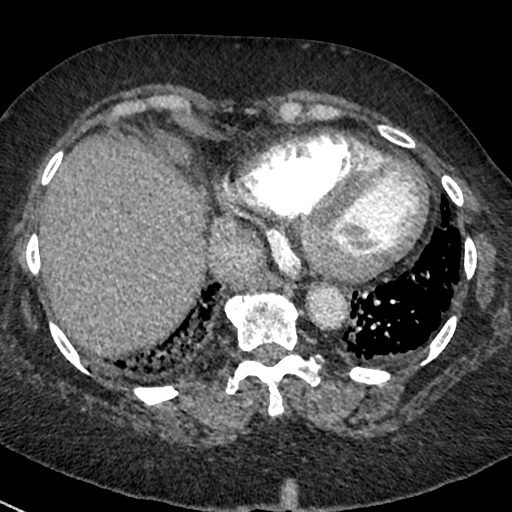
[im 128/383  lung]
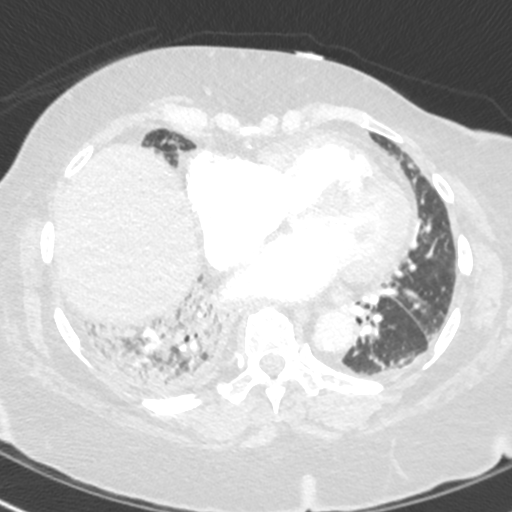
[im 149/383  soft-tissue]
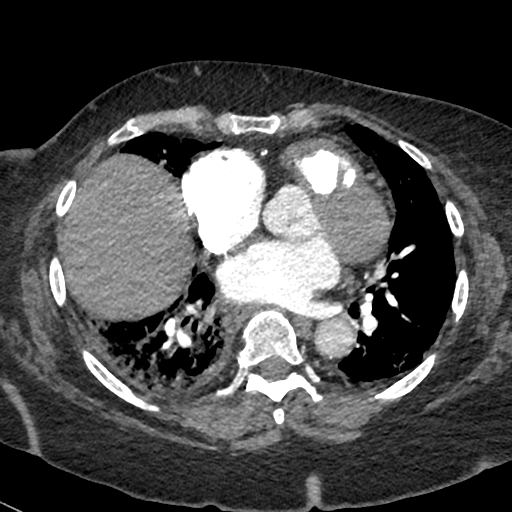
[im 170/383  lung]
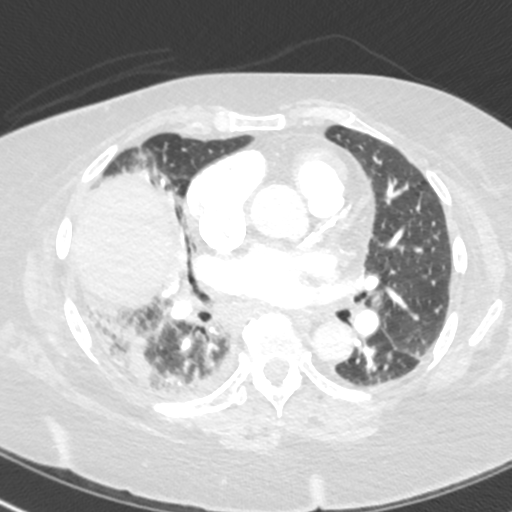
[im 192/383  soft-tissue]
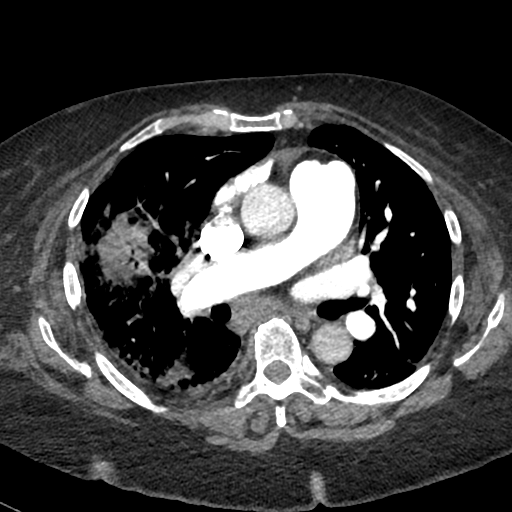
[im 213/383  lung]
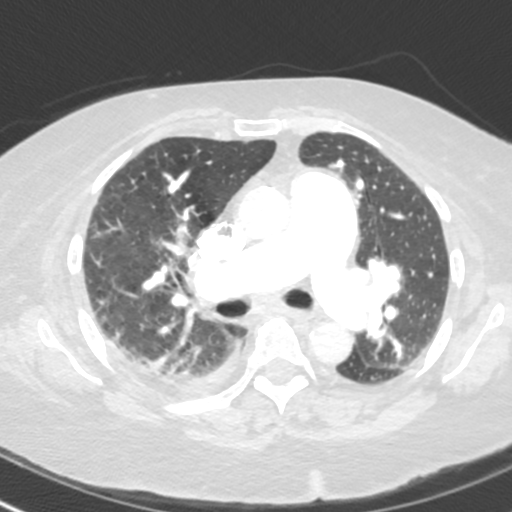
[im 234/383  soft-tissue]
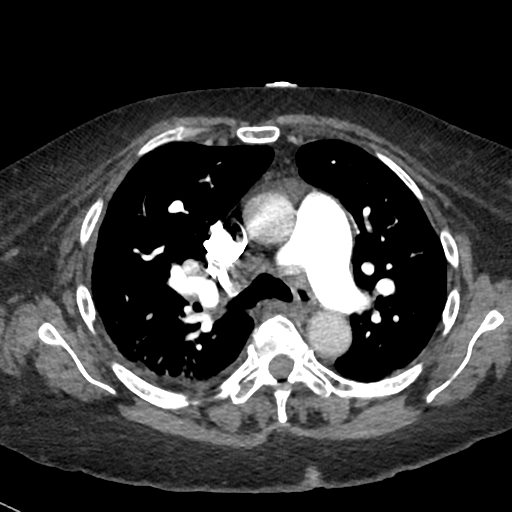
[im 255/383  lung]
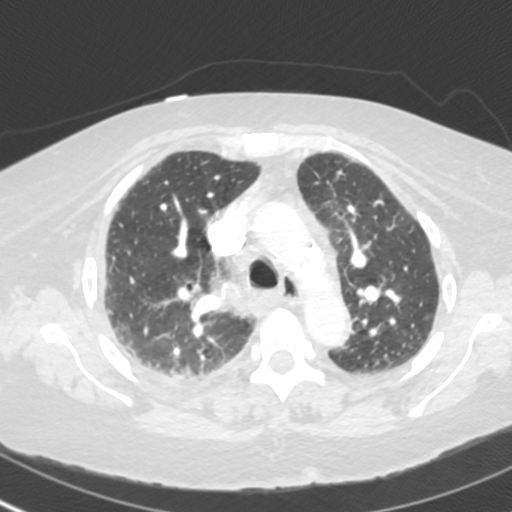
[im 298/383  soft-tissue]
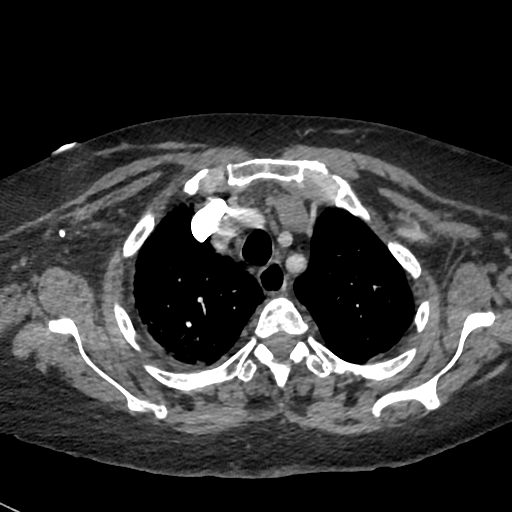
[im 319/383  lung]
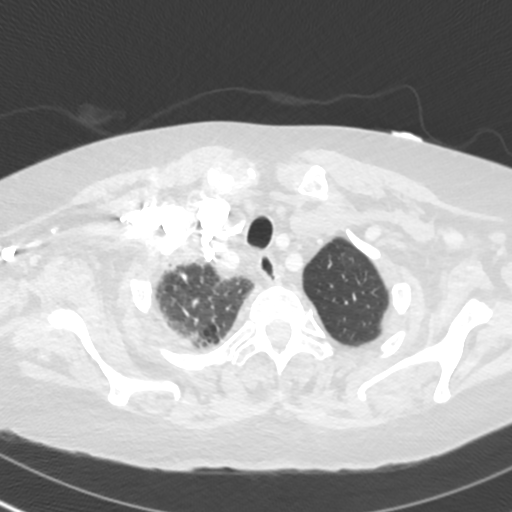
[im 340/383  soft-tissue]
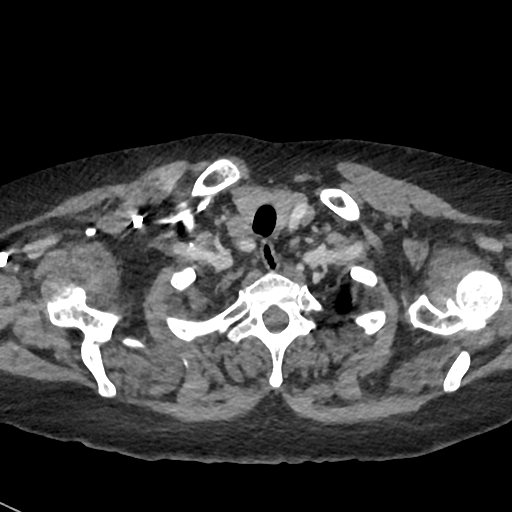
[im 361/383  lung]
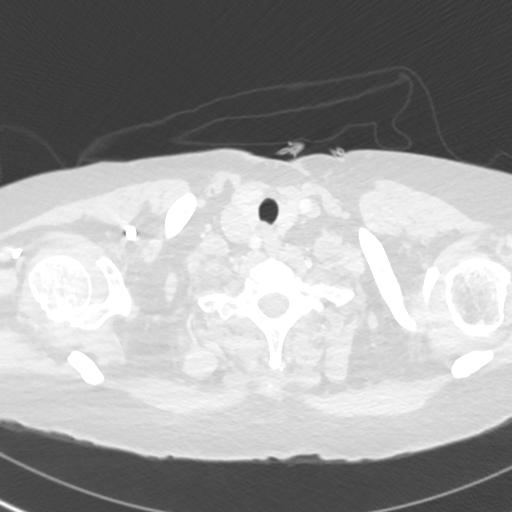

[Series 10: cor · coronal · 0.54mm/px · 3 of 127 slices shown]
[im 32/127  soft-tissue]
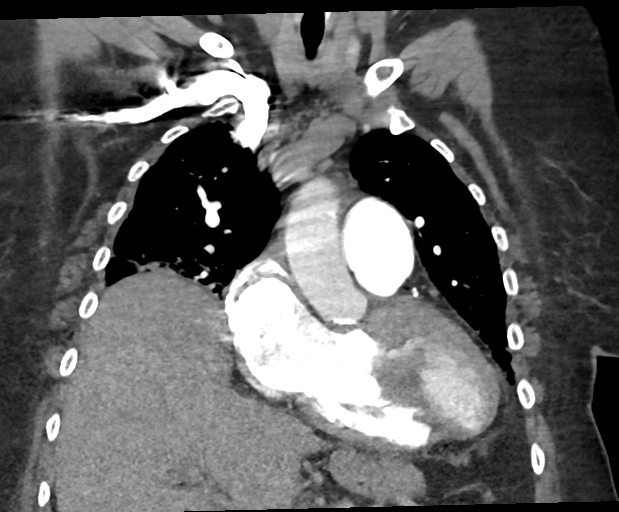
[im 64/127  soft-tissue]
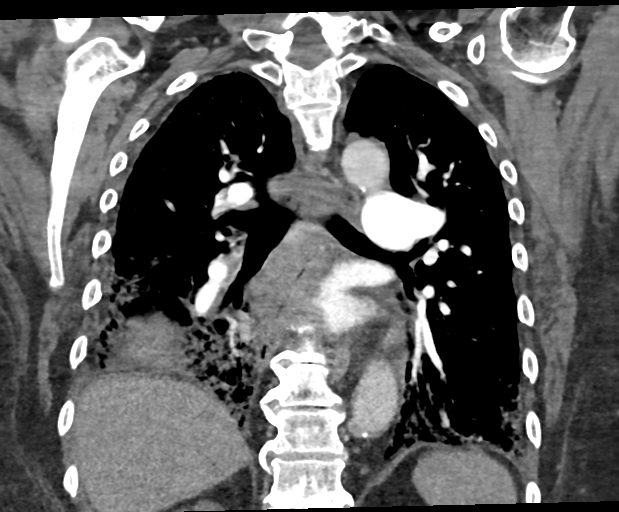
[im 95/127  soft-tissue]
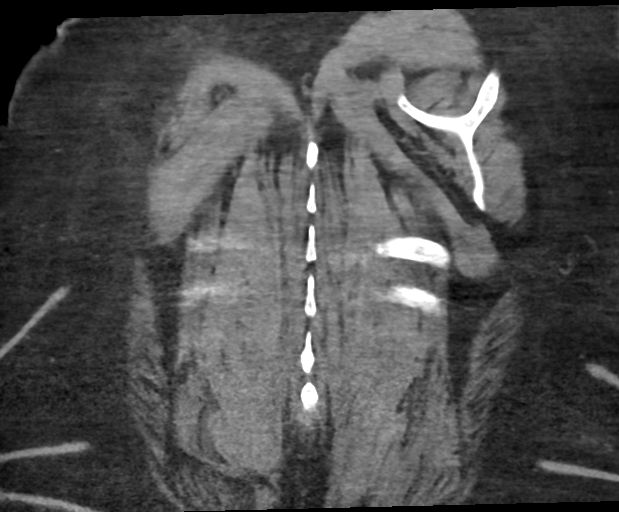

[18 of 46 positions shown; findings below may reference images not displayed]

FINDINGS: Cardiovascular: There is excellent opacification of the pulmonary
arterial tree. There is no intraluminal filling defect to suggest
acute pulmonary embolism. The central pulmonary arteries are
markedly dilated in keeping with changes of pulmonary arterial
hypertension. There is mild global cardiac enlargement with
particular enlargement of the right ventricle and right atrium
suggesting changes of elevated right heart pressure. There is
extensive coronary artery calcification. No pericardial effusion.
The thoracic aorta is age-appropriate. No aneurysm.

Mediastinum/Nodes: No pathologic thoracic adenopathy.

Lungs/Pleura: There is bibasilar consolidation, similar to that
noted on prior examination, more prevalent within the a dependent
right lower lobe. Scattered nodular infiltrate is also noted within
the right upper lobe laterally. These are new since remote prior
examination of [DATE]. In the acute setting, this may reflect
changes of atypical infection or aspiration. There is stable
elevation of the right hemidiaphragm with right basilar compressive
atelectasis.

Upper Abdomen: Cholecystectomy has been performed. Limited images of
the upper abdomen are otherwise unremarkable.

Musculoskeletal: No lytic or blastic bone lesion.

Review of the MIP images confirms the above findings.
IMPRESSION: Stable asymmetric bibasilar consolidation, most in keeping with
aspiration or infection in the acute setting.

No pulmonary embolism.

Morphologic changes in keeping with pulmonary arterial hypertension
and elevated right heart pressure. Superimposed extensive coronary
artery calcification.

Aortic Atherosclerosis ([CH]-[CH]).

## 2019-08-08 IMAGING — DX DG CHEST 1V PORT
1 series · 1 of 1 positions shown · non-contrast
Comparison: [DATE]

CLINICAL DATA: Cough and shortness of breath

EXAM:
PORTABLE CHEST 1 VIEW

[chest ap]
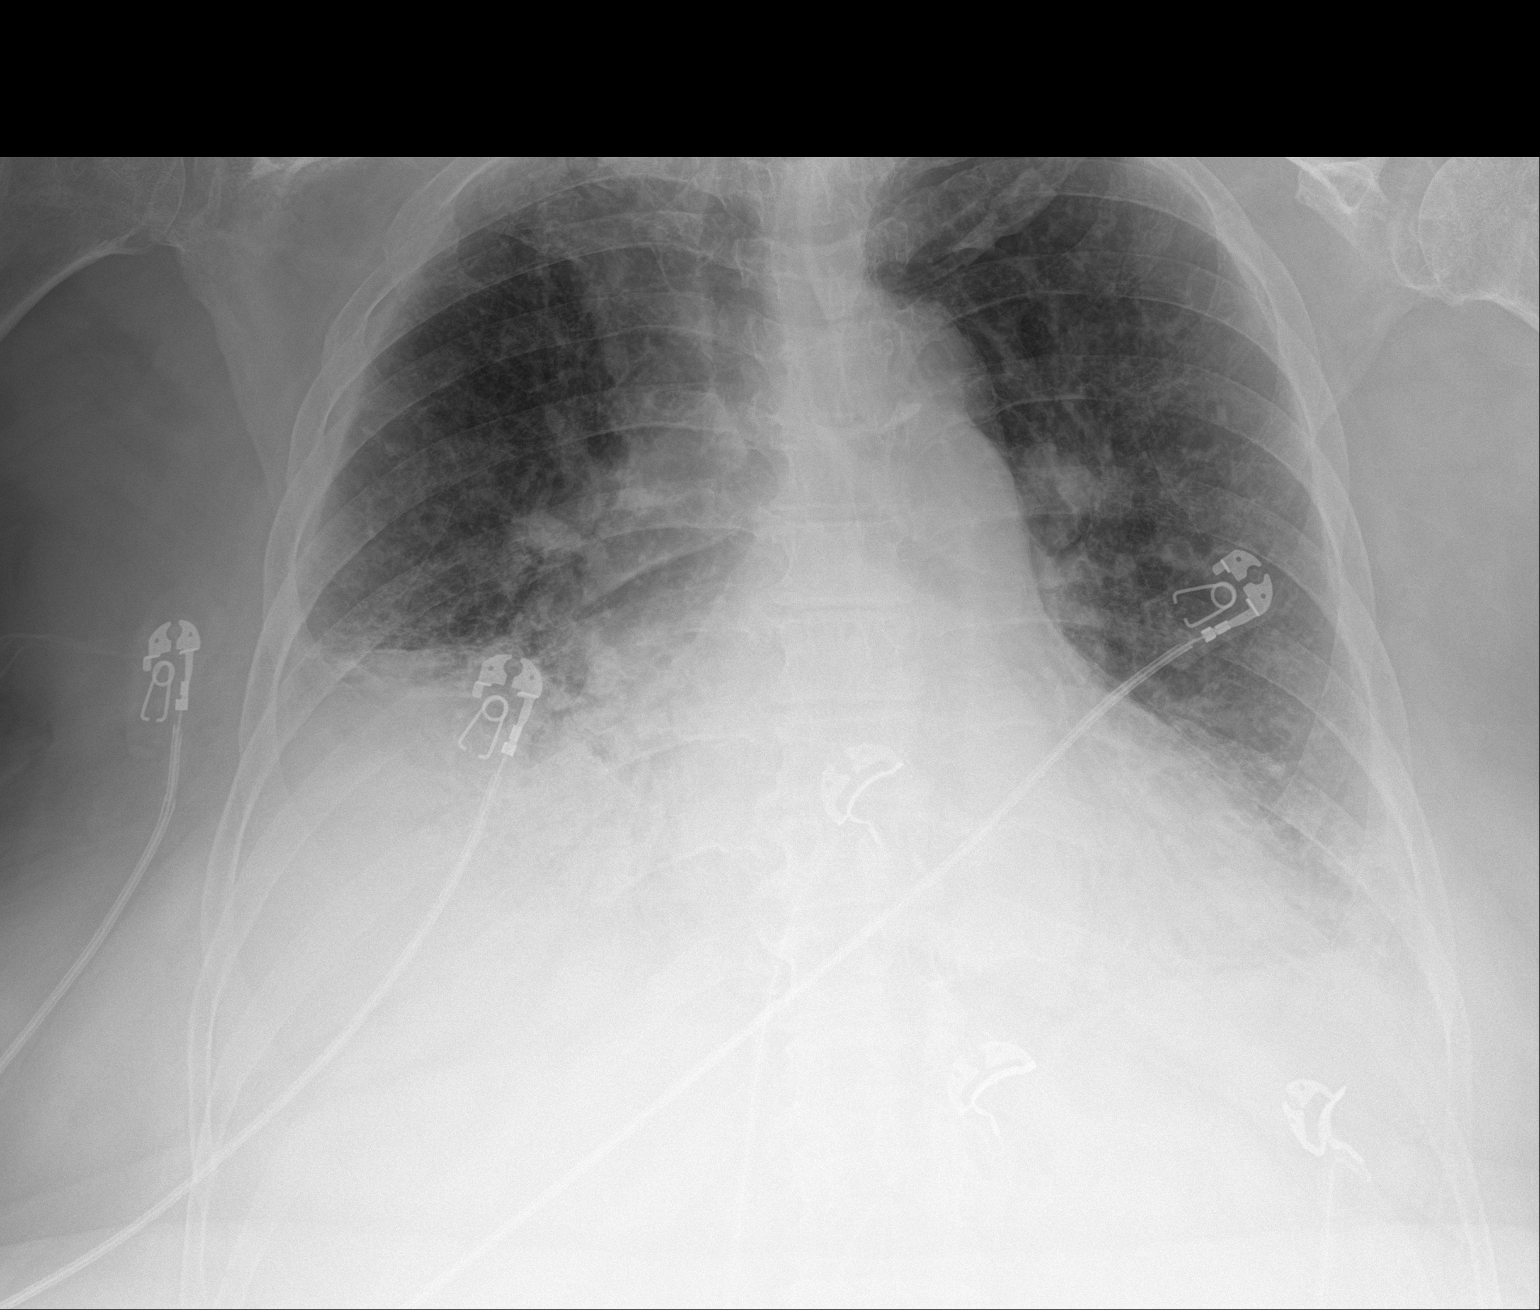

[1 of 1 positions shown; findings below may reference images not displayed]

FINDINGS: Again noted is cardiomegaly. There is hazy patchy airspace opacity
seen at the right lung base with a small right pleural effusion.
There is a hazy patchy airspace opacity in the left perihilar region
and retrocardiac region. There is prominence of the central
pulmonary vasculature.
IMPRESSION: As on recent CT multifocal airspace opacities which could be due to
asymmetric edema and/or infectious etiology.

Small right pleural effusion.

## 2019-08-08 MED ORDER — ROSUVASTATIN CALCIUM 20 MG PO TABS
20.0000 mg | ORAL_TABLET | Freq: Every day | ORAL | Status: DC
  Administered 2019-08-09 – 2019-08-23 (×15): 20 mg via ORAL
  Filled 2019-08-08 (×15): qty 1

## 2019-08-08 MED ORDER — PREDNISONE 5 MG PO TABS
5.0000 mg | ORAL_TABLET | Freq: Every day | ORAL | Status: DC
  Administered 2019-08-09 – 2019-08-23 (×15): 5 mg via ORAL
  Filled 2019-08-08 (×15): qty 1

## 2019-08-08 MED ORDER — IOHEXOL 350 MG/ML SOLN
84.0000 mL | Freq: Once | INTRAVENOUS | Status: AC | PRN
  Administered 2019-08-08: 84 mL via INTRAVENOUS

## 2019-08-08 MED ORDER — HYDROXYCHLOROQUINE SULFATE 200 MG PO TABS
200.0000 mg | ORAL_TABLET | Freq: Two times a day (BID) | ORAL | Status: DC
  Administered 2019-08-08 – 2019-08-23 (×31): 200 mg via ORAL
  Filled 2019-08-08 (×32): qty 1

## 2019-08-08 MED ORDER — ACETAMINOPHEN 650 MG RE SUPP
650.0000 mg | Freq: Four times a day (QID) | RECTAL | Status: DC | PRN
Start: 2019-08-08 — End: 2019-08-19

## 2019-08-08 MED ORDER — ACETAMINOPHEN 500 MG PO TABS
1000.0000 mg | ORAL_TABLET | Freq: Four times a day (QID) | ORAL | Status: DC | PRN
  Administered 2019-08-10: 1000 mg via ORAL
  Filled 2019-08-08: qty 2

## 2019-08-08 MED ORDER — CYCLOSPORINE 0.05 % OP EMUL
1.0000 [drp] | Freq: Three times a day (TID) | OPHTHALMIC | Status: DC | PRN
  Filled 2019-08-08: qty 1

## 2019-08-08 MED ORDER — IOHEXOL 350 MG/ML SOLN
100.0000 mL | Freq: Once | INTRAVENOUS | Status: AC | PRN
  Administered 2019-08-08: 100 mL via INTRAVENOUS

## 2019-08-08 MED ORDER — METOPROLOL TARTRATE 12.5 MG HALF TABLET
12.5000 mg | ORAL_TABLET | Freq: Two times a day (BID) | ORAL | Status: DC
  Administered 2019-08-08 – 2019-08-23 (×30): 12.5 mg via ORAL
  Filled 2019-08-08 (×31): qty 1

## 2019-08-08 MED ORDER — POLYETHYLENE GLYCOL 3350 17 G PO PACK: 17.0000 g | PACK | Freq: Every day | ORAL | Status: DC | PRN

## 2019-08-08 MED ORDER — APIXABAN 5 MG PO TABS
5.0000 mg | ORAL_TABLET | Freq: Two times a day (BID) | ORAL | Status: DC
  Administered 2019-08-08 – 2019-08-12 (×8): 5 mg via ORAL
  Filled 2019-08-08 (×9): qty 1

## 2019-08-08 MED ORDER — HYDROCODONE-ACETAMINOPHEN 10-325 MG PO TABS
1.0000 | ORAL_TABLET | Freq: Three times a day (TID) | ORAL | Status: DC | PRN
  Administered 2019-08-09 – 2019-08-14 (×4): 1 via ORAL
  Filled 2019-08-08 (×4): qty 1

## 2019-08-08 MED ORDER — LEVOTHYROXINE SODIUM 50 MCG PO TABS
50.0000 ug | ORAL_TABLET | Freq: Every day | ORAL | Status: DC
  Administered 2019-08-09 – 2019-08-23 (×15): 50 ug via ORAL
  Filled 2019-08-08 (×15): qty 1

## 2019-08-08 MED ORDER — LEFLUNOMIDE 20 MG PO TABS
10.0000 mg | ORAL_TABLET | Freq: Every day | ORAL | Status: DC
  Administered 2019-08-09 – 2019-08-23 (×15): 10 mg via ORAL
  Filled 2019-08-08 (×17): qty 0.5

## 2019-08-08 NOTE — Telephone Encounter (Signed)
I received a callback from micro lab and spoke with Dorquipa. She stated that the final culture report IDed Corynebacteria which is a contaminant. Sensitivity not needed.  I tried to reach [facility] multiple times with no success. I was finally informed that all their staff are in the meeting. It will be nice if they can repeat her culture.  I called patient and her daughter to give and update, but was unable to reach them. I will follow-up with all parties involved later.  HIPAA compliant callback message left for the patient.

## 2019-08-08 NOTE — ED Provider Notes (Signed)
[facility] EMERGENCY DEPARTMENT Provider Note   CSN: [id number] Arrival date & time: 08/08/19  1604     History No chief complaint on file.   [name] is a 74 y.o. 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  The patient was also found to have a DVT.  She was started on Eliquis.  Patient was discharged to skilled nursing facility.  She comes to the emergency department today because her SNF was called due to positive blood cultures.  She was told to come to the emergency department for treatment however review of EMR shows that the culture grew out corynebacterium and this was thought to be a contaminant.  Her PCP tried calling the skilled nursing facility however she arrived to the ER prior to being able to get in touch with anyone.  The patient was also found to have a new oxygen requirement.  She is currently on 2 L.  She has had a productive cough and also complains of left lower extremity swelling, heat and redness.  Previous ultrasound showed DVT on the right side.  Patient denies fever or chills.  HPI     Past Medical History:  Diagnosis Date  . Anemia    takes Ferrous Sulfate daily  . Arthritis   . Chronic bronchitis ([place])    Combivent if needed)  . Colostomy care [facility]) 2004  . Coronary artery disease   . Diverticular disease   . Dizziness   . Dry eyes    uses Restasis daily  . H/O hiatal hernia   . History of blood transfusion    no abnormal reaction noted  . History of colon polyps    benign  . Hyperlipidemia    takes Zocor daily  . Hypertension    takes Metoprolol daily  . Hypothyroidism    takes Synthroid daily  . Nocturia    states only d/t being on Lasix  . Numbness and tingling    left foot  .  Peripheral edema    takes Lasix daily  . Rheumatoid arthritis(714.0)    Methotrexate 2 times a week  . Shortness of breath    with exertion    Patient Active Problem List   Diagnosis Date Noted  . Mitral valve disorder   . Hypertension, essential   . CVA (cerebral vascular accident) ([place]) 08/01/2019  . Pain due to onychomycosis of toenails of both feet 08/31/2018  . Oxygen dependent   . Fever   . Decreased range of motion of left shoulder   . Drug-induced neutropenia ([place])   . Other pancytopenia ([place])   . Methotrexate toxicity   . 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 Acute diverticulitis   . Ventral hernia without obstruction or gangrene   . Cellulitis of leg, left   . Morbid obesity ([place])   . Diverticulitis 06/18/2017  . 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 S/P revision of total knee 10/24/2015    Past Surgical History:  Procedure Laterality Date  . ABDOMINAL HYSTERECTOMY  1965  . CAPSULOTOMY Bilateral 06/01/2012   Procedure: MINOR CAPSULOTOMY;  Surgeon: Myrtha Mantis., MD;  Location: [facility];  Service: Ophthalmology;  Laterality: Bilateral;  . CATARACT EXTRACTION W/PHACO  01/01/2011   Procedure:  CATARACT EXTRACTION PHACO AND INTRAOCULAR LENS PLACEMENT (IOC);  Surgeon: [name], MD;  Location: [facility];  Service: Ophthalmology;  Laterality: Left;  . CATARACT EXTRACTION W/PHACO  12/24/2011   Procedure: CATARACT EXTRACTION PHACO AND INTRAOCULAR LENS PLACEMENT (IOC);  Surgeon: [name], MD;  Location: [facility];  Service: Ophthalmology;  Laterality: Right;  . COLON SURGERY  2002   d/t diverticulosis  . COLONOSCOPY    . COLOSTOMY    . ESOPHAGOGASTRODUODENOSCOPY    . EYE SURGERY    . KNEE ARTHROSCOPY     bilateral  . TEE WITHOUT CARDIOVERSION N/A 08/04/2019   Procedure: TRANSESOPHAGEAL ECHOCARDIOGRAM (TEE);  Surgeon: Geralynn Rile, MD;  Location: [facility];  Service: Cardiovascular;  Laterality: N/A;  . TONSILLECTOMY    . TOTAL KNEE ARTHROPLASTY   2005/2008   bilateral  . TOTAL KNEE REVISION Right 10/24/2015   Procedure: TOTAL KNEE REVISION;  Surgeon: [name], MD;  Location: [facility];  Service: Orthopedics;  Laterality: Right;  . YAG LASER APPLICATION Bilateral 04/04/1827   Procedure: YAG LASER APPLICATION;  Surgeon: Myrtha Mantis., MD;  Location: [facility];  Service: Ophthalmology;  Laterality: Bilateral;     OB History   No obstetric history on file.     Family History  Problem Relation Age of Onset  . Anesthesia problems Neg Hx   . Hypotension Neg Hx   . Malignant hyperthermia Neg Hx   . Pseudochol deficiency Neg Hx     Social History   Tobacco Use  . Smoking status: Former Smoker    Quit date: 10/22/2002    Years since quitting: 16.8  . Smokeless tobacco: Never Used  . Tobacco comment: quit smoking in 2004  Substance Use Topics  . Alcohol use: No    Comment: quit drinking in 2004  . Drug use: No    Home Medications Prior to Admission medications   Medication Sig Start Date End Date Taking? Authorizing Provider  apixaban (ELIQUIS) 5 MG TABS tablet Take 1 tablet (5 mg total) by mouth every 12 (twelve) hours. 08/06/19   [name], DO  cycloSPORINE (RESTASIS) 0.05 % ophthalmic emulsion Place 1 drop into both eyes 3 (three) times daily as needed (dry eyes).     [provider]  etanercept (ENBREL) 50 MG/ML injection Inject 50 mg into the skin every Friday.    [provider]  folic acid (FOLVITE) 1 MG tablet Take 1 mg by mouth daily.      [provider]  HYDROcodone-acetaminophen (NORCO) 10-325 MG tablet Take 1 tablet by mouth 3 (three) times daily as needed for severe pain.     [provider]  hydroxychloroquine (PLAQUENIL) 200 MG tablet Take 200 mg by mouth 2 (two) times daily. 07/25/19   [provider]  leflunomide (ARAVA) 10 MG tablet Take 10 mg by mouth daily. 07/25/19   [provider]  levothyroxine (SYNTHROID, LEVOTHROID) 50 MCG tablet Take 50  mcg by mouth daily.      [provider]  metoprolol tartrate (LOPRESSOR) 25 MG tablet Take 0.5 tablets (12.5 mg total) by mouth 2 (two) times daily. 08/06/19   [name], DO  naproxen sodium (ALEVE) 220 MG tablet Take 220 mg by mouth daily as needed.    [provider]  predniSONE (DELTASONE) 5 MG tablet Take 5 mg by mouth daily. 07/25/19   [provider]  rosuvastatin (CRESTOR) 20 MG tablet Take 1 tablet (20 mg total) by mouth daily at  6 PM. 08/06/19   [name], DO    Allergies    Patient has no known allergies.  Review of Systems   Review of Systems Ten systems reviewed and are negative for acute change, except as noted in the HPI.   Physical Exam Updated Vital Signs BP (!) 164/82   Pulse 77   Temp 98.2 F (36.8 C) (Oral)   Resp (!) 26   Ht _0  (1.6 m)   Wt 130.6 kg   SpO2 100%   BMI 51.00 kg/m   Physical Exam Vitals and nursing note reviewed.  Constitutional:      General: She is not in acute distress.    Appearance: She is well-developed. She is not diaphoretic.  HENT:     Head: Normocephalic and atraumatic.  Eyes:     General: No scleral icterus.    Conjunctiva/sclera: Conjunctivae normal.  Cardiovascular:     Rate and Rhythm: Normal rate and regular rhythm.     Heart sounds: Normal heart sounds. No murmur heard.  No friction rub. No gallop.   Pulmonary:     Effort: Pulmonary effort is normal. Tachypnea present. No respiratory distress.     Breath sounds: Rhonchi present.  Abdominal:     General: Bowel sounds are normal. There is no distension.     Palpations: Abdomen is soft. There is no mass.     Tenderness: There is no abdominal tenderness. There is no guarding.  Musculoskeletal:     Cervical back: Normal range of motion.     Right lower leg: Edema present.     Left lower leg: Edema present.     Comments: Left lower extremity with warmth, erythema and tenderness to palpation.  Bilateral lower extremity edema.   Difficult to tell if there is size difference due to body habitus.  Skin:    General: Skin is warm and dry.  Neurological:     Mental Status: She is alert and oriented to person, place, and time.  Psychiatric:        Behavior: Behavior normal.     ED Results / Procedures / Treatments   Labs (all labs ordered are listed, but only abnormal results are displayed) Labs Reviewed  SARS CORONAVIRUS 2 BY RT PCR (HOSPITAL ORDER, [facility])  CULTURE, BLOOD (ROUTINE X 2)  CULTURE, BLOOD (ROUTINE X 2)  BASIC METABOLIC PANEL  CBC WITH DIFFERENTIAL/PLATELET  BRAIN NATRIURETIC PEPTIDE    EKG EKG Interpretation  Date/Time:  Monday August 08 2019 16:46:06 EDT Ventricular Rate:  78 PR Interval:    QRS Duration: 134 QT Interval:  423 QTC Calculation: 482 R Axis:   157 Text Interpretation: Sinus rhythm RBBB and LPFB Nonspecific T wave abnormality Confirmed by Lajean Saver [phone]) on 08/08/2019 4:48:20 PM   Radiology VAS [place] LOWER EXTREMITY VENOUS (DVT) ([facility] and [facility] 7a-7p)  Result Date: 08/08/2019  Lower Venous DVTStudy Indications: Swelling, and Edema.  Limitations: Body habitus and poor ultrasound/tissue interface. Comparison Study: 08/05/19 Performing Technologist: Abram Sander RVS  Examination Guidelines: A complete evaluation includes B-mode imaging, spectral Doppler, color Doppler, and power Doppler as needed of all accessible portions of each vessel. Bilateral testing is considered an integral part of a complete examination. Limited examinations for reoccurring indications may be performed as noted. The reflux portion of the exam is performed with the patient in reverse Trendelenburg.  +-----+---------------+---------+-----------+----------+--------------+ RIGHTCompressibilityPhasicitySpontaneityPropertiesThrombus Aging +-----+---------------+---------+-----------+----------+--------------+ CFV  Full           Yes  Yes                                  +-----+---------------+---------+-----------+----------+--------------+   +---------+---------------+---------+-----------+----------+--------------+ LEFT     CompressibilityPhasicitySpontaneityPropertiesThrombus Aging +---------+---------------+---------+-----------+----------+--------------+ CFV      Full           Yes      Yes                                 +---------+---------------+---------+-----------+----------+--------------+ SFJ      Full                                                        +---------+---------------+---------+-----------+----------+--------------+ FV Prox  Full                                                        +---------+---------------+---------+-----------+----------+--------------+ FV Mid   Full                                                        +---------+---------------+---------+-----------+----------+--------------+ FV DistalFull                                                        +---------+---------------+---------+-----------+----------+--------------+ PFV      Full                                                        +---------+---------------+---------+-----------+----------+--------------+ POP      Full           Yes      Yes                                 +---------+---------------+---------+-----------+----------+--------------+ PTV      Full                                                        +---------+---------------+---------+-----------+----------+--------------+ PERO                                                  Not visualized +---------+---------------+---------+-----------+----------+--------------+     Summary: RIGHT: - No evidence of common femoral vein obstruction.  LEFT: - There is  no evidence of deep vein thrombosis in the lower extremity.  - No cystic structure found in the popliteal fossa.  *See table(s) above for measurements and observations.    Preliminary      Procedures .Critical Care Performed by: Margarita Mail, PA-C Authorized by: Margarita Mail, PA-C   Critical care provider statement:    Critical care time (minutes):  45   Critical care time was exclusive of:  Separately billable procedures and treating other patients   Critical care was necessary to treat or prevent imminent or life-threatening deterioration of the following conditions:  Respiratory failure   Critical care was time spent personally by me on the following activities:  Discussions with consultants, evaluation of patient's response to treatment, examination of patient, ordering and performing treatments and interventions, ordering and review of laboratory studies, ordering and review of radiographic studies, pulse oximetry, re-evaluation of patient's condition, obtaining history from patient or surrogate and review of old charts   (including critical care time)  Medications Ordered in ED Medications  HYDROcodone-acetaminophen (NORCO) 10-325 MG per tablet 1 tablet (1 tablet Oral Given 08/09/19 1125)  leflunomide (ARAVA) tablet 10 mg (10 mg Oral Given 08/09/19 1054)  hydroxychloroquine (PLAQUENIL) tablet 200 mg (200 mg Oral Given 08/09/19 2138)  metoprolol tartrate (LOPRESSOR) tablet 12.5 mg (12.5 mg Oral Given 08/09/19 2137)  rosuvastatin (CRESTOR) tablet 20 mg (20 mg Oral Given 08/09/19 2138)  levothyroxine (SYNTHROID) tablet 50 mcg (50 mcg Oral Given 08/09/19 1125)  predniSONE (DELTASONE) tablet 5 mg (5 mg Oral Given 08/09/19 1053)  apixaban (ELIQUIS) tablet 5 mg (5 mg Oral Given 08/09/19 2138)  cycloSPORINE (RESTASIS) 0.05 % ophthalmic emulsion 1 drop (has no administration in time range)  acetaminophen (TYLENOL) tablet 1,000 mg (has no administration in time range)    Or  acetaminophen (TYLENOL) suppository 650 mg (has no administration in time range)  polyethylene glycol (MIRALAX / GLYCOLAX) packet 17 g (has no administration in time range)  doxycycline (VIBRA-TABS)  tablet 100 mg (100 mg Oral Given 08/09/19 2139)  iohexol (OMNIPAQUE) 350 MG/ML injection 100 mL (100 mLs Intravenous Contrast Given 08/08/19 2051)  iohexol (OMNIPAQUE) 350 MG/ML injection 84 mL (84 mLs Intravenous Contrast Given 08/08/19 2104)  furosemide (LASIX) injection 40 mg (40 mg Intravenous Given 08/09/19 0140)  furosemide (LASIX) injection 40 mg (40 mg Intravenous Given 08/09/19 1056)    ED Course  I have reviewed the triage vital signs and the nursing notes.  Pertinent labs & imaging results that were available during my care of the patient were reviewed by me and considered in my medical decision making (see chart for details).    MDM Rules/Calculators/A&P                          EL:FYBOFBPZ lab  WC:HENIDPO is gathered by patient and emr. Previous records obtained and reviewed. DDX:The patient's complaint of sob involves an extensive number of diagnostic and treatment options, and is a complaint that carries with it a high risk of complications, morbidity, and potential mortality. Given the large differential diagnosis, medical decision making is of high complexity. The emergent differential diagnosis for shortness of breath includes, but is not limited to, Pulmonary edema, bronchoconstriction, Pneumonia, Pulmonary embolism, Pneumotherax/ Hemothorax, Dysrythmia, ACS.  Labs: I ordered reviewed and interpreted labs which includeBMP which shows no acute abnormalities. CBC- mild anemia, Covid Negative, BNP markedly elevated at almost 2000- no previous to compare,  Imaging: I ordered and reviewed images which included US  of the leg and cxr. I independently visualized and interpreted all imaging. Significant findings include Infilrates on the lung. There are no acute, significant findings on [place] - no DVT EKG: NSR 78  Consults: MDM: Patient immunocompromised with cellulitis of the left leg and hyopxic resp failure. She has a hx of pulm hypertension. I suspect a typical Pnuemonia as the cause,  however ? If this is multifactorial with some edema given effusion on CXR.  Patient will be admitted. On 2 L via [place] Patient disposition:admit  The patient appears reasonably stabilized for admission considering the current resources, flow, and capabilities available in the ED at this time, and I doubt any other [facility] requiring further screening and/or treatment in the ED prior to admission.        Final Clinical Impression(s) / ED Diagnoses Final diagnoses:  None    Rx / DC Orders ED Discharge Orders    None       Margarita Mail, PA-C 08/09/19 2227    Lajean Saver, MD 08/11/19 1723

## 2019-08-08 NOTE — Progress Notes (Signed)
Lower extremity venous has been completed.   Preliminary results in CV Proc.   Abram Sander 08/08/2019 4:48 PM

## 2019-08-08 NOTE — ED Triage Notes (Addendum)
Pt from [facility]; sent to receive antibiotics based on recent blood culture results; pt has new o2 requirement as of today; 89% RA, placed on 2L; pt c/o some sob with activity; pt also has redness and heat in lower left leg  160/90 P 80 96% 2L

## 2019-08-08 NOTE — H&P (Addendum)
[facility] Admission History and Physical Service Pager: [phone]  Patient name: [name] Medical record number: [id number] Date of birth: 1945-12-18 Age: 74 y.o. Gender: female  Primary Care Provider: Dixie Dials, MD Consultants: Marlynn Perking Code Status: FULL Preferred Emergency Contact: Virgie Kunda, daughter- [phone]  Chief Complaint:Hypoxia with increased O2 demand  Assessment and Plan: [name] is a 74 y.o. female presenting after a positive result on Blood culture post discharge to a SNF . PMH is significant for CVA, hyperlipidemia, hypertension, anemia, and rheumatoid arthritis.  Hypoxia likely secondary to CHF exacerbation- HFpEF. Most recent echo, 08/04/2019, had EF 70-75%. Patient complains of SOB worse with ambulation and requiring 2L nasal cannula. On exam in the ED the patient continued to need 2L nasal cannula and was noted to have some crackles in the RLL and a productive cough w/ yellow sputum on exam which is unchanged from baseline. Patient also reported increased swelling in her bilateral extremities. CXR suggested multifocal airspace opacities possibly due to asymmetric edema and/or infectious etiology. Patient has a hx of smoking from before the age of 32 until 26. CBC did not have elevated WBC lowering suspicion for infection. BNP is 1,197 at admission, last BNP was 550 in 2015.  Patient was recently discharged to SNF on room air.  Received signout from ED that patient had oxygen desaturations to the 80s while at rest in the ED.  Low suspicion for COPD exacerbation or infectious process given normal WBC count, afebrile, no change in cough or sputum characteristic. CTA negative for PE. - Patient in observation, attending Dr. Milta Deiters - Monitor O2 requirements, wean to room air as tolerated - IV Lasix - Strict I/Os -Daily weights - BMP AM -CBC a.m. - monitor pulse ox -Consider antibiotics if worsening -Ambulate with pulse ox and consider  patient needs baseline O2 requirements  Positive Blood Culture likely contaminant Patient was discharged to SNF with cultures pending; however they came back positive for corynebacterium. Repeat culture has been ordered to confirm if this is a true positive or possible contaminant. - f/u repeat blood cx -CBC AM  History of CVA- Patient was recently discharged from the service after CVA. 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 The patient underwent test to look for a possible embolic source. 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  Patient has known PFO.  Patient received repeat lower extremity duplex in ED for suspicion of DVT this admission.  Preliminary results back and pending final read. -Continue Eliquis Crestor -PT/OT -Follow-up lower extremity Doppler results -Follow-up neurology outpatient   L leg erythema Patient was concerned about the increased swelling her legs. She also had slight erythema of the lower left shin and increased pain to palpation of the area. The patient was found to have a Right peroneal DVT, of indeterminate age during most recent hospitalization and was subsequently placed on Eliquis. Patient has been taking her Eliquis as prescribed. She was reporting SOB, but no chest pain. CT Angio at admission was negative for PE.  - Continue Eliquis - f/u LE DVT US  HTN- chronic  Patient takes metoprolol 12.5 BID. BP range 158/78-188/74. Wide pulse pressure is likely secondary to chronic HTN. - Continue home medication - Monitor BP  CKD Stage II Cr is 1.18 last Cr was 1.02. Will continue to trend to monitor is CKD is advancing. - BMP AM  Rheumatoid arthritis-  stable, chronic Patient takes prednisone, leflunomide, and hydroxychloroquine.  - Continue home meds - Start Protonix  Hyperlipidemia- stable and chronic Patient is post- CVA and has a hx HLD. Patient takes  rosuvastatin at home. - Continue home meds  Chronic pain syndrome Takes norco PRN at home, PMP aware reviewed and appropriate. - Continue home regimen  Hypothyroidism- Chronic and stable Patient takes levothyroxine. - Continue home medication  FEN/GI: Heart  Prophylaxis: Eliquis  Disposition: SNF  History of Present Illness:  [name] is a 74 y.o. female presenting with SOB. Patient was recently discharged, 7/ 10/2019 after a CVA to a SNF.  A blood culture was pending at the time of discharge. The blood culture was to r/o endocarditis after an abnormal growth was found on TEE on the PMVL. There was low suspicion for endocarditis as patient did not show signs of septicemia and did not have a history that made endocarditis more likely than papillary fibroelastoma.  Patient reported to the ED after her SNF transferred her due to a contaminant positive result on a blood culture for corynebacteria that was pending at discharge. At presentation to the hospital patient was noted to have a new O2 requirement, abnormal lung exam, and increased swelling in lower extremities. Patient reported that she felt that she was more SOB and that this worsened with ambulation. She was requiring 2L nasal cannula at her SNF. The patient also reported that she had a chronic productive cough with yellow sputum. The patient was concerned that her bilateral lower extremity swelling was worsening and that she was having specific pain in her L leg along the lower tibia.Patient is concerned because the area is warm and erythematous. She reported that she had been compliant with her medications while at the SNF.  Review Of Systems: Per HPI with the following additions:   Review of Systems  HENT: Positive for congestion. Negative for trouble swallowing.   Respiratory: Positive for apnea, cough and shortness of breath. Negative for chest tightness.        Cough with productive yellow sputum  Cardiovascular: Positive for  leg swelling. Negative for chest pain and palpitations.  Gastrointestinal: Negative for abdominal distention and abdominal pain.  Genitourinary: Negative for difficulty urinating.  Musculoskeletal: Positive for arthralgias and myalgias.  Skin:       Erythema and increased warmth of the tibial area on the lower L leg.  Allergic/Immunologic: Positive for immunocompromised state.     Patient Active Problem List   Diagnosis Date Noted  . Mitral valve disorder   . Hypertension, essential   . CVA (cerebral vascular accident) ([place]) 08/01/2019  . Pain due to onychomycosis of toenails of both feet 08/31/2018  . Oxygen dependent   . Fever   . Decreased range of motion of left shoulder   . Drug-induced neutropenia ([place])   . Other pancytopenia ([place])   . Methotrexate toxicity   . 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 Acute diverticulitis   . Ventral hernia without obstruction or gangrene   . Cellulitis of leg, left   . Morbid obesity ([place])   . Diverticulitis 06/18/2017  . 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 S/P revision of total knee 10/24/2015    Past Medical History: Past Medical History:  Diagnosis Date  . Anemia    takes Ferrous Sulfate daily  . Arthritis   . Chronic bronchitis ([place])    Combivent if needed)  . Colostomy care [facility]) 2004  . Coronary artery disease   .  Diverticular disease   . Dizziness   . Dry eyes    uses Restasis daily  . H/O hiatal hernia   . History of blood transfusion    no abnormal reaction noted  . History of colon polyps    benign  . Hyperlipidemia    takes Zocor daily  . Hypertension    takes Metoprolol daily  . Hypothyroidism    takes Synthroid daily  . Nocturia    states only d/t being on Lasix  . Numbness and tingling    left foot  . Peripheral edema    takes Lasix daily  . Rheumatoid arthritis(714.0)    Methotrexate 2 times a week  . Shortness of breath    with exertion    Past Surgical History: Past Surgical History:   Procedure Laterality Date  . ABDOMINAL HYSTERECTOMY  1965  . CAPSULOTOMY Bilateral 06/01/2012   Procedure: MINOR CAPSULOTOMY;  Surgeon: Myrtha Mantis., MD;  Location: [place];  Service: Ophthalmology;  Laterality: Bilateral;  . CATARACT EXTRACTION W/PHACO  01/01/2011   Procedure: CATARACT EXTRACTION PHACO AND INTRAOCULAR LENS PLACEMENT (IOC);  Surgeon: [name], MD;  Location: [place];  Service: Ophthalmology;  Laterality: Left;  . CATARACT EXTRACTION W/PHACO  12/24/2011   Procedure: CATARACT EXTRACTION PHACO AND INTRAOCULAR LENS PLACEMENT (IOC);  Surgeon: [name], MD;  Location: [place];  Service: Ophthalmology;  Laterality: Right;  . COLON SURGERY  2002   d/t diverticulosis  . COLONOSCOPY    . COLOSTOMY    . ESOPHAGOGASTRODUODENOSCOPY    . EYE SURGERY    . KNEE ARTHROSCOPY     bilateral  . TEE WITHOUT CARDIOVERSION N/A 08/04/2019   Procedure: TRANSESOPHAGEAL ECHOCARDIOGRAM (TEE);  Surgeon: Geralynn Rile, MD;  Location: [place];  Service: Cardiovascular;  Laterality: N/A;  . TONSILLECTOMY    . TOTAL KNEE ARTHROPLASTY  2005/2008   bilateral  . TOTAL KNEE REVISION Right 10/24/2015   Procedure: TOTAL KNEE REVISION;  Surgeon: [name], MD;  Location: [place];  Service: Orthopedics;  Laterality: Right;  . YAG LASER APPLICATION Bilateral 05/06/4494   Procedure: YAG LASER APPLICATION;  Surgeon: Myrtha Mantis., MD;  Location: [place];  Service: Ophthalmology;  Laterality: Bilateral;    Social History: Social History   Tobacco Use  . Smoking status: Former Smoker    Quit date: 10/22/2002    Years since quitting: 16.8  . Smokeless tobacco: Never Used  . Tobacco comment: quit smoking in 2004  Substance Use Topics  . Alcohol use: No    Comment: quit drinking in 2004  . Drug use: No   Additional social history: n/a Please also refer to relevant sections of EMR.  Family History: Family History  Problem Relation Age of Onset  . Anesthesia problems Neg Hx    . Hypotension Neg Hx   . Malignant hyperthermia Neg Hx   . Pseudochol deficiency Neg Hx      Allergies and Medications: No Known Allergies No current facility-administered medications on file prior to encounter.   Current Outpatient Medications on File Prior to Encounter  Medication Sig Dispense Refill  . apixaban (ELIQUIS) 5 MG TABS tablet Take 1 tablet (5 mg total) by mouth every 12 (twelve) hours. 60 tablet 0  . cycloSPORINE (RESTASIS) 0.05 % ophthalmic emulsion Place 1 drop into both eyes 3 (three) times daily as needed (dry eyes).     Marland Kitchen etanercept (ENBREL) 50 MG/ML injection Inject 50 mg into the skin every Friday.    Marland Kitchen  folic acid (FOLVITE) 1 MG tablet Take 1 mg by mouth daily.      Marland Kitchen HYDROcodone-acetaminophen (NORCO) 10-325 MG tablet Take 1 tablet by mouth 3 (three) times daily as needed for severe pain.     . hydroxychloroquine (PLAQUENIL) 200 MG tablet Take 200 mg by mouth 2 (two) times daily.    Marland Kitchen leflunomide (ARAVA) 10 MG tablet Take 10 mg by mouth daily.    Marland Kitchen levothyroxine (SYNTHROID, LEVOTHROID) 50 MCG tablet Take 50 mcg by mouth daily.      . metoprolol tartrate (LOPRESSOR) 25 MG tablet Take 0.5 tablets (12.5 mg total) by mouth 2 (two) times daily. 60 tablet 0  . naproxen sodium (ALEVE) 220 MG tablet Take 220 mg by mouth daily as needed.    . predniSONE (DELTASONE) 5 MG tablet Take 5 mg by mouth daily.    . rosuvastatin (CRESTOR) 20 MG tablet Take 1 tablet (20 mg total) by mouth daily at 6 PM. 30 tablet 0    Objective: BP (!) 180/70   Pulse 76   Temp 98.2 F (36.8 C) (Oral)   Resp (!) 22   Ht _0  (1.6 m)   Wt 130.6 kg   SpO2 98%   BMI 51.00 kg/m  Exam: General: Patient resting comfortably in bed, jovial and participating in the exam. Eyes: Normal EOM Neck: No adenopathy Cardiovascular: Regular rate and rhythm, no murmur detected Respiratory: Breathing on 2L Oz, expiratory wheeze, crackles in the RLL Gastrointestinal: Colostomy bag is clean, no erythema of  the area noted MSK: Pain to palpation of the bilateral lower extremities, erythema of the tibial area of the lower left leg, non- pitting edema of the bilateral lower extremities Derm: No sores noted on exam Neuro: Decreased use of the right hand but stable   Labs and Imaging: CBC BMET  Recent Labs  Lab 08/08/19 1625  WBC 7.1  HGB 11.3*  HCT 38.3  PLT 299   Recent Labs  Lab 08/08/19 1625  NA 141  K 4.9  CL 106  CO2 27  BUN 15  CREATININE 1.18*  GLUCOSE 92  CALCIUM 9.3     EKG:  08/08/2019- NSR  Freida Busman, MD 08/08/2019, 8:36 PM PGY-1, College Intern pager: [phone], text pages welcome  [place]    I have seen and examined this patient.     I have discussed the findings and exam with the intern and agree with the above note, which I have edited appropriately in [place]. I helped develop the management plan that is described in the resident's note, and I agree with the content.   Doristine Mango, DO PGY-3 Family Medicine Resident

## 2019-08-09 DIAGNOSIS — I5043 Acute on chronic combined systolic (congestive) and diastolic (congestive) heart failure: Secondary | ICD-10-CM

## 2019-08-09 DIAGNOSIS — R799 Abnormal finding of blood chemistry, unspecified: Secondary | ICD-10-CM | POA: Diagnosis not present

## 2019-08-09 DIAGNOSIS — R0902 Hypoxemia: Secondary | ICD-10-CM | POA: Diagnosis not present

## 2019-08-09 LAB — CBC
HCT: 37.3 % (ref 36.0–46.0)
Hemoglobin: 11.1 g/dL — ABNORMAL LOW (ref 12.0–15.0)
MCH: 25.2 pg — ABNORMAL LOW (ref 26.0–34.0)
MCHC: 29.8 g/dL — ABNORMAL LOW (ref 30.0–36.0)
MCV: 84.8 fL (ref 80.0–100.0)
Platelets: 304 10*3/uL (ref 150–400)
RBC: 4.4 MIL/uL (ref 3.87–5.11)
RDW: 14.6 % (ref 11.5–15.5)
WBC: 7.1 10*3/uL (ref 4.0–10.5)
nRBC: 0 % (ref 0.0–0.2)

## 2019-08-09 LAB — BASIC METABOLIC PANEL
Anion gap: 11 (ref 5–15)
BUN: 15 mg/dL (ref 8–23)
CO2: 25 mmol/L (ref 22–32)
Calcium: 9.4 mg/dL (ref 8.9–10.3)
Chloride: 106 mmol/L (ref 98–111)
Creatinine, Ser: 1.12 mg/dL — ABNORMAL HIGH (ref 0.44–1.00)
GFR calc Af Amer: 57 mL/min — ABNORMAL LOW (ref >60)
GFR calc non Af Amer: 49 mL/min — ABNORMAL LOW (ref >60)
Glucose, Bld: 85 mg/dL (ref 70–99)
Potassium: 4.5 mmol/L (ref 3.5–5.1)
Sodium: 142 mmol/L (ref 135–145)

## 2019-08-09 LAB — CULTURE, BLOOD (ROUTINE X 2)
Culture: NO GROWTH
Special Requests: ADEQUATE

## 2019-08-09 MED ORDER — DOXYCYCLINE HYCLATE 100 MG PO TABS
100.0000 mg | ORAL_TABLET | Freq: Two times a day (BID) | ORAL | Status: AC
  Administered 2019-08-09 – 2019-08-13 (×10): 100 mg via ORAL
  Filled 2019-08-09 (×10): qty 1

## 2019-08-09 MED ORDER — FUROSEMIDE 10 MG/ML IJ SOLN
40.0000 mg | Freq: Once | INTRAMUSCULAR | Status: AC
  Administered 2019-08-09: 40 mg via INTRAVENOUS
  Filled 2019-08-09: qty 4

## 2019-08-09 NOTE — Progress Notes (Signed)
Family Medicine Teaching Service Daily Progress Note Intern Pager: [phone]  Patient name: [name] Medical record number: [id number] Date of birth: 04/19/1945 Age: 74 y.o. Gender: female  Primary Care Provider: Dixie Dials, MD Consultants: None Code Status: FULL  Pt Overview and Major Events to Date:  Admitted 7/12  Assessment and Plan: [name] is a 74 y.o. female presenting after a positive result on Blood culture post discharge to a SNF. PMH is significant for CVA, hyperlipidemia, hypertension, anemia, and rheumatoid arthritis.  Hypoxia likely secondary to CHF exacerbation- HFpEF. Most recent echo, 08/04/2019, had EF 70-75%. Patient was recently discharged to SNF on room air, required 2L O2 in the ED. On exam, lungs are clear to auscultation, no crackles.  BNP is 1,197 at admission, last BNP was 550 in 2015. WBC 7.1, electrolytes WNL. Creatinine mildly elevated at 1.12. - Monitor O2 requirements, wean to room air as tolerated - ambulate with pulse ox if able to ambulate - Monitor urine output s/p IV Lasix - Strict I/Os -Daily weights - monitor pulse ox -Consider antibiotics if worsening  Productive Cough  Patient reports continued cough since last admission. Yellow productive sputum. Afebrile, WBC within normal limits. Imaging notable for multifocal airspace opacities which could be due to asymmetric edema and/or infectious etiology.  - Will treat for CAP considering patient states cough began last admission - Doxycycline 100 mg q12 hours x5 days  Positive Blood Culture likely contaminant Patient was discharged to SNF with cultures pending; however they came back positive for corynebacterium. Repeat culture has been ordered to confirm if this is a true positive or possible contaminant.  - f/u repeat blood cx -CBC AM  History of CVA- Patient was recently discharged from the service after CVA. 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 The patient  underwent test to look for a possible embolic source. 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  Patient has known PFO.  Patient received repeat lower extremity duplex in ED for suspicion of DVT this admission.  Preliminary results back and pending final read. -Continue Eliquis Crestor -PT/OT -Follow-up lower extremity Doppler results -Follow-up neurology outpatient   L leg erythema Patient was concerned about the increased swelling her legs. She also had slight erythema of the lower left shin and increased pain to palpation of the area. The patient was found to have a Right peroneal DVT, of indeterminate age during most recent hospitalization and was subsequently placed on Eliquis. Patient has been taking her Eliquis as prescribed. She was reporting SOB, but no chest pain. CT Angio at admission was negative for PE.  - Continue Eliquis - f/u LE DVT US  HTN- chronic  Patient takes metoprolol 12.5 BID. BP range 158/78-188/74. Wide pulse pressure is likely secondary to chronic HTN. - Continue home medication - Monitor BP  CKD Stage II Cr is 1.18 > 1.12. Will continue to trend to monitor if CKD is advancing.  Rheumatoid arthritis- stable, chronic Patient takes prednisone, leflunomide, and hydroxychloroquine.  - Continue home meds - Start Protonix  Hyperlipidemia- stable and chronic Patient is post- CVA and has a hx HLD. Patient takes rosuvastatin at home. - Continue home meds  Chronic pain syndrome Takes norco PRN at home, PMP aware reviewed and appropriate. - Continue home regimen  Hypothyroidism- Chronic and stable Patient takes levothyroxine. - Continue home medication  FEN/GI: Heart  Prophylaxis: Eliquis  Disposition: Potential discharge  tomorrow.  Subjective:  Patient today complains of right shoulder and hand pain. This is not new, states has had this for a long time. Continued cough since  last admission with yellow productive sputum. No fevers, shortness of breath or chest pain.  Objective: Temp:  [98.2 F (36.8 C)] 98.2 F (36.8 C) (07/12 1623) Pulse Rate:  [72-80] 74 (07/13 0632) Resp:  [16-26] 16 (07/13 1624) BP: (143-188)/(62-115) 143/67 (07/13 4695) SpO2:  [98 %-100 %] 100 % (07/13 0722) Weight:  [130.6 kg] 130.6 kg (07/12 1615) Physical Exam: General: No acute distress  Cardiovascular: RRR Respiratory: Clear in all lung fields, cannot appreciate crackles or wheezing Abdomen: obese, mild RLQ tenderness to palpation, no rebound or guarding Extremities: non-pitting edema bilaterally, tenderness to LLE on palpation. 2+ DP and radial   Laboratory: Recent Labs  Lab 08/05/19 0520 08/08/19 1625 08/09/19 0247  WBC 8.6 7.1 7.1  HGB 9.1* 11.3* 11.1*  HCT 31.5* 38.3 37.3  PLT 248 299 304   Recent Labs  Lab 08/05/19 0520 08/08/19 1625 08/09/19 0247  NA 141 141 142  K 4.5 4.9 4.5  CL 109 106 106  CO2 _0 BUN _1 CREATININE 1.02* 1.18* 1.12*  CALCIUM 8.8* 9.3 9.4  GLUCOSE 91 92 85    Imaging/Diagnostic Tests: CT Angio Chest 7/12 IMPRESSION: Stable asymmetric bibasilar consolidation, most in keeping with aspiration or infection in the acute setting. No pulmonary embolism. Morphologic changes in keeping with pulmonary arterial hypertension and elevated right heart pressure. Superimposed extensive coronary artery calcification. Aortic Atherosclerosis (ICD10-I70.0).  Chest x-ray 7/12 IMPRESSION: As on recent CT multifocal airspace opacities which could be due to asymmetric edema and/or infectious etiology. Small right pleural effusion.  [name], DO 08/09/2019, 7:20 AM PGY-1, [place] Intern pager: [phone], text pages welcome

## 2019-08-09 NOTE — ED Notes (Signed)
Lunch Tray Ordered @ 2409.

## 2019-08-09 NOTE — ED Notes (Signed)
Pt notified staff that ostomy bag was leaking. New ostomy bag being ordered.

## 2019-08-10 DIAGNOSIS — I5043 Acute on chronic combined systolic (congestive) and diastolic (congestive) heart failure: Secondary | ICD-10-CM | POA: Diagnosis not present

## 2019-08-10 LAB — CBC
HCT: 35.6 % — ABNORMAL LOW (ref 36.0–46.0)
Hemoglobin: 10.6 g/dL — ABNORMAL LOW (ref 12.0–15.0)
MCH: 25.4 pg — ABNORMAL LOW (ref 26.0–34.0)
MCHC: 29.8 g/dL — ABNORMAL LOW (ref 30.0–36.0)
MCV: 85.2 fL (ref 80.0–100.0)
Platelets: 351 10*3/uL (ref 150–400)
RBC: 4.18 MIL/uL (ref 3.87–5.11)
RDW: 14.6 % (ref 11.5–15.5)
WBC: 7.4 10*3/uL (ref 4.0–10.5)
nRBC: 0 % (ref 0.0–0.2)

## 2019-08-10 LAB — BASIC METABOLIC PANEL
Anion gap: 12 (ref 5–15)
BUN: 21 mg/dL (ref 8–23)
CO2: 28 mmol/L (ref 22–32)
Calcium: 8.9 mg/dL (ref 8.9–10.3)
Chloride: 100 mmol/L (ref 98–111)
Creatinine, Ser: 1.52 mg/dL — ABNORMAL HIGH (ref 0.44–1.00)
GFR calc Af Amer: 39 mL/min — ABNORMAL LOW (ref >60)
GFR calc non Af Amer: 34 mL/min — ABNORMAL LOW (ref >60)
Glucose, Bld: 88 mg/dL (ref 70–99)
Potassium: 4.2 mmol/L (ref 3.5–5.1)
Sodium: 140 mmol/L (ref 135–145)

## 2019-08-10 MED ORDER — POLYETHYLENE GLYCOL 3350 17 G PO PACK: 17.0000 g | PACK | Freq: Every day | ORAL | 0 refills | Status: AC | PRN

## 2019-08-10 MED ORDER — DOXYCYCLINE HYCLATE 100 MG PO TABS: 100.0000 mg | ORAL_TABLET | Freq: Two times a day (BID) | ORAL | 0 refills | Status: DC

## 2019-08-10 MED ORDER — SPIRIVA HANDIHALER 18 MCG IN CAPS: 18.0000 ug | ORAL_CAPSULE | Freq: Every day | RESPIRATORY_TRACT | 0 refills | Status: DC

## 2019-08-10 MED ORDER — ACETAMINOPHEN 500 MG PO TABS: 1000.0000 mg | ORAL_TABLET | Freq: Four times a day (QID) | ORAL | 0 refills | Status: DC | PRN

## 2019-08-10 MED ORDER — FUROSEMIDE 20 MG PO TABS
20.0000 mg | ORAL_TABLET | ORAL | 0 refills | Status: DC
Start: 2019-08-10 — End: 2019-08-23

## 2019-08-10 NOTE — Progress Notes (Signed)
SATURATION QUALIFICATIONS: (This note is used to comply with regulatory documentation for home oxygen)  Patient Saturations on Room Air at Rest = 81%  Patient Saturations on Room Air while Ambulating = 81%  Patient Saturations on 2 Liters of oxygen while Ambulating = %  Please briefly explain why patient needs home oxygen:

## 2019-08-10 NOTE — Evaluation (Signed)
Physical Therapy Evaluation Patient Details Name: [name] MRN: [id number] DOB: 12-26-1945 Today's Date: 08/10/2019   History of Present Illness  Pt is 74 y.o. female with PMH of HTN, HLD, CAD, anemia, RA, TKR (R), morbid obesity, decreased ROM in L shoulder, Presented to ED with R hand weakness. MRI reveals small acute cortical/subcortical infarct affecting the left pre and post central gyri. Pt discharged on 7/10 to SNF, but was later found to have a positive blood culture for corynebacterium and readmitted on 7/12.  Clinical Impression  Pt presents to PT with deficits in functional mobility, gait, balance, endurance, strength, power, and cardiopulmonary function. Pt currently requires minA for all bed mobility and transfer due to weakness and fatigues quickly with mobility. Pt lives alone at home and is at a high falls risk currently due to fatigue and weakness. Pt will benefit from continued acute PT services to improve activity tolerance and to aide in a return to her prior level of function. PT recommends a return to SNF to aide in improving mobility quality and activity tolerance.    Follow Up Recommendations SNF;Supervision/Assistance - 24 hour    Equipment Recommendations  Rolling walker with 5" wheels    Recommendations for Other Services       Precautions / Restrictions Precautions Precautions: Fall Precaution Comments: watch SpO2 Restrictions Weight Bearing Restrictions: No      Mobility  Bed Mobility Overal bed mobility: Needs Assistance Bed Mobility: Supine to Sit;Sit to Supine     Supine to sit: Min assist Sit to supine: Min assist      Transfers Overall transfer level: Needs assistance Equipment used: Rolling walker (2 wheeled) Transfers: Sit to/from Stand Sit to Stand: Min assist            Ambulation/Gait Ambulation/Gait assistance: Min guard Gait Distance (Feet): 15 Feet Assistive device: Rolling walker (2 wheeled) Gait Pattern/deviations:  Step-to pattern;Wide base of support Gait velocity: reduced Gait velocity interpretation: <1.31 ft/sec, indicative of household ambulator General Gait Details: pt with short step to gait, wide BOS, reduced gait speed  Stairs            Wheelchair Mobility    Modified Rankin (Stroke Patients Only) Modified Rankin (Stroke Patients Only) Pre-Morbid Rankin Score: Moderate disability Modified Rankin: Moderately severe disability     Balance Overall balance assessment: Needs assistance Sitting-balance support: Single extremity supported;Feet supported Sitting balance-Leahy Scale: Poor Sitting balance - Comments: reliant on unilateral UE support   Standing balance support: Bilateral upper extremity supported Standing balance-Leahy Scale: Poor Standing balance comment: reliant on UE support                             Pertinent Vitals/Pain Pain Assessment: Faces Faces Pain Scale: Hurts little more Pain Location: RUE Pain Descriptors / Indicators: Grimacing Pain Intervention(s): Monitored during session    Home Living Family/patient expects to be discharged to:: Skilled nursing facility Living Arrangements: Children Available Help at Discharge: Family;Available PRN/intermittently Type of Home: Apartment Home Access: Level entry     Home Layout: One level Home Equipment: Grab bars - tub/shower;Walker - 4 wheels      Prior Function Level of Independence: Needs assistance   Gait / Transfers Assistance Needed: pt ambulates with rollator  ADL's / Homemaking Assistance Needed: Daughter assists with cooking, cleaning. Pt dresses herself with pullover gowns.   Comments: Pt mainly stays at home watching TV and has transportation to doctor's appointments.  Hand Dominance   Dominant Hand: Right    Extremity/Trunk Assessment   Upper Extremity Assessment Upper Extremity Assessment: RUE deficits/detail RUE Deficits / Details: RUE generally weak but at  least 4-/5 based on observation    Lower Extremity Assessment Lower Extremity Assessment: Generalized weakness    Cervical / Trunk Assessment Cervical / Trunk Assessment: Other exceptions Cervical / Trunk Exceptions: moribd obesity  Communication   Communication: No difficulties  Cognition Arousal/Alertness: Awake/alert Behavior During Therapy: WFL for tasks assessed/performed Overall Cognitive Status: Within Functional Limits for tasks assessed                                        General Comments General comments (skin integrity, edema, etc.): pt on 1L [place] upon arrival, weaned to room air at rest with stable sats at 93-95%. Pt desats to 81% with ambulation on room air. PT unable to assess sats while on supplemental O2 with mobility as pt fatigues after one short bout of ambulation    Exercises     Assessment/Plan    PT Assessment Patient needs continued PT services  PT Problem List Decreased strength;Decreased activity tolerance;Decreased balance;Decreased mobility;Decreased knowledge of use of DME;Decreased safety awareness;Decreased knowledge of precautions;Impaired sensation;Decreased range of motion;Decreased coordination;Cardiopulmonary status limiting activity       PT Treatment Interventions DME instruction;Gait training;Functional mobility training;Therapeutic activities;Therapeutic exercise;Neuromuscular re-education;Patient/family education    PT Goals (Current goals can be found in the Care Plan section)  Acute Rehab PT Goals Patient Stated Goal: To go to rehab and get stronger PT Goal Formulation: With patient Time For Goal Achievement: 08/24/19 Potential to Achieve Goals: Good    Frequency Min 3X/week   Barriers to discharge        Co-evaluation               AM-PAC PT "6 Clicks" Mobility  Outcome Measure Help needed turning from your back to your side while in a flat bed without using bedrails?: A Little Help needed moving from  lying on your back to sitting on the side of a flat bed without using bedrails?: A Little Help needed moving to and from a bed to a chair (including a wheelchair)?: A Little Help needed standing up from a chair using your arms (e.g., wheelchair or bedside chair)?: A Little Help needed to walk in hospital room?: A Little Help needed climbing 3-5 steps with a railing? : Total 6 Click Score: 16    End of Session Equipment Utilized During Treatment: Oxygen Activity Tolerance: Patient limited by fatigue Patient left: in bed;with call bell/phone within reach;with bed alarm set Nurse Communication: Mobility status PT Visit Diagnosis: Other abnormalities of gait and mobility (R26.89);Muscle weakness (generalized) (M62.81) Hemiplegia - Right/Left: Right Hemiplegia - dominant/non-dominant: Dominant Hemiplegia - caused by: Cerebral infarction    Time: 0017-4944 PT Time Calculation (min) (ACUTE ONLY): 29 min   Charges:   PT Evaluation $PT Eval Moderate Complexity: 1 Mod          Zenaida Niece, PT, DPT Acute Rehabilitation Pager: [phone]   Zenaida Niece 08/10/2019, 5:37 PM

## 2019-08-10 NOTE — Discharge Summary (Deleted)
[facility] Discharge Summary  Patient name: [name] Medical record number: [id number] Date of birth: Jul 21, 1945 Age: 74 y.o. Gender: female Date of Admission: 08/08/2019  Date of Discharge: 08/11/19 Admitting Physician: Richarda Osmond, DO  Primary Care Provider: Dixie Dials, MD Consultants: None  Indication for Hospitalization: Hypoxia likely secondary to CHF exacerbation- HFpEF  Discharge Diagnoses/Problem List:  Hypoxia likely secondary to CHF exacerbation Cough  Disposition: Back to SNF  Discharge Condition: Stable  Discharge Exam:  Temp:  [98 F (36.7 C)-98.5 F (36.9 C)] 98 F (36.7 C) (07/13 2137) Pulse Rate:  [72-83] 75 (07/13 2137) Resp:  [16-18] 18 (07/13 2137) BP: (106-158)/(53-75) 137/65 (07/13 2137) SpO2:  [91 %-100 %] 96 % (07/13 2137) Physical Exam: General: obese, no acute distress Cardiovascular: RRR, no murmurs Respiratory: CTAB no wheezing or crackles appreciated Abdomen: Obese, colostomy bag LLQ Extremities: 2+ DP, radial pulses   Brief Hospital Course:  [name] is a 74 y.o. female presenting after a positive result on blood culture post discharge as well as new hypoxia requiring 2L oxygen.  PMH is significant for CVA, hyperlipidemia, hypertension, anemia, and rheumatoid arthritis.  Hypoxia likely 2/2 CHF exacerbation - HFpEF  BNP 1,197 at admission, previously 550 in 2015. Unsure of patients baseline. She was recently admitted for acute CVA and right DVT, discharged on 7/10. Echo at that time showed moderately reduced RV function with associated pulmonary hypertension. This admission she received 40 IV Lasix x1 with good response.  I&O -195.  Patient intermittently requiring 1 to 2 L of oxygen on admission but satting 91+% at rest. Ambulatory pulse oximetry shows de-sats to 81%. Patient required 1-2L while ambulating.  She has a significant smoking history and has previously been recommended to follow-up with  pulmonology for PFTs.  Referral placed for pulmonology last admission.   Productive Cough Patient with yellow productive sputum. Questionable whether cough began last admission or has been chronic. CXR showed multifocal airspace opacities which could be due to asymmetric edema and/or infectious etiology. This was unchanged from prior CT. Due to uncertainty, patient was started on 100 mg Doxycycline x5 days (7/13-7/17) to treat HAP. Sputum changed from yellow to clear by time of discharge.   Hx Positive Blood Cx  Patient with blood cultures positive for corynebacterium on 7/8. Repeat blood cultures performed on 7/12 showed no growth in 2 days. Original culture likely a contaminant. Patient afebrile on admission, VSS. No systemic signs of infection.    Issues for Follow Up:  1. Discharged on 20 mg Lasix QOD. Follow up BMP to reassess Creatinine. Follow up with cardiologist for possible medication adjustments.  2. Discharged with Spiriva. PFT's outpatient. Referral to pulmonology placed.  Significant Procedures: None  Significant Labs and Imaging:  Recent Labs  Lab 08/08/19 1625 08/09/19 0247 08/10/19 0337  WBC 7.1 7.1 7.4  HGB 11.3* 11.1* 10.6*  HCT 38.3 37.3 35.6*  PLT 299 304 351   Recent Labs  Lab 08/04/19 0416 08/04/19 0416 08/05/19 0520 08/05/19 0520 08/08/19 1625 08/08/19 1625 08/09/19 0247 08/10/19 0337  NA 142  --  141  --  141  --  142 140  K 4.6   < > 4.5   < > 4.9   < > 4.5 4.2  CL 110  --  109  --  106  --  106 100  CO2 25  --  26  --  27  --  25 28  GLUCOSE 91  --  91  --  92  --  85 88  BUN 20  --  16  --  15  --  15 21  CREATININE 1.13*  --  1.02*  --  1.18*  --  1.12* 1.52*  CALCIUM 9.0  --  8.8*  --  9.3  --  9.4 8.9   < > = values in this interval not displayed.    Results/Tests Pending at Time of Discharge: None  Discharge Medications:  Allergies as of 08/10/2019   No Known Allergies     Medication List    TAKE these medications   acetaminophen  500 MG tablet Commonly known as: TYLENOL Take 2 tablets (1,000 mg total) by mouth every 6 (six) hours as needed for mild pain (or Fever >/= 101).   apixaban 5 MG Tabs tablet Commonly known as: ELIQUIS Take 1 tablet (5 mg total) by mouth every 12 (twelve) hours.   cycloSPORINE 0.05 % ophthalmic emulsion Commonly known as: RESTASIS Place 1 drop into both eyes 3 (three) times daily as needed (dry eyes).   doxycycline 100 MG tablet Commonly known as: VIBRA-TABS Take 1 tablet (100 mg total) by mouth every 12 (twelve) hours. Your whole course of this antibiotic is five days. Upon discharge, you will only have three days left. We have included only enough antibiotic for the remaining three days.   Enbrel 50 MG/ML injection Generic drug: etanercept Inject 50 mg into the skin every Friday.   folic acid 1 MG tablet Commonly known as: FOLVITE Take 1 mg by mouth daily.   furosemide 20 MG tablet Commonly known as: Lasix Take 1 tablet (20 mg total) by mouth every other day.   HYDROcodone-acetaminophen 10-325 MG tablet Commonly known as: NORCO Take 1 tablet by mouth 3 (three) times daily as needed for severe pain.   hydroxychloroquine 200 MG tablet Commonly known as: PLAQUENIL Take 200 mg by mouth 2 (two) times daily.   leflunomide 10 MG tablet Commonly known as: ARAVA Take 10 mg by mouth daily.   levothyroxine 50 MCG tablet Commonly known as: SYNTHROID Take 50 mcg by mouth daily.   metoprolol tartrate 25 MG tablet Commonly known as: LOPRESSOR Take 0.5 tablets (12.5 mg total) by mouth 2 (two) times daily.   polyethylene glycol 17 g packet Commonly known as: MIRALAX / GLYCOLAX Take 17 g by mouth daily as needed for mild constipation.   predniSONE 5 MG tablet Commonly known as: DELTASONE Take 5 mg by mouth daily.   rosuvastatin 20 MG tablet Commonly known as: CRESTOR Take 1 tablet (20 mg total) by mouth daily at 6 PM.   Spiriva HandiHaler 18 MCG inhalation capsule Generic  drug: tiotropium Place 1 capsule (18 mcg total) into inhaler and inhale daily.            Durable Medical Equipment  (From admission, onward)         Start     Ordered   08/10/19 0000  For home use only DME oxygen       Question Answer Comment  Length of Need 6 Months   Mode or (Route) Nasal cannula   Liters per Minute 1   Frequency Continuous (stationary and portable oxygen unit needed)   Oxygen delivery system Gas      08/10/19 1811          Discharge Instructions: Please refer to Patient Instructions section of EMR for full details.  Patient was counseled important signs and symptoms that should prompt  return to medical care, changes in medications, dietary instructions, activity restrictions, and follow up appointments.   Follow-Up Appointments:  Follow-up Information    Dixie Dials, MD. Go in 2 day(s).   Specialty: Cardiology Why: For a hospital follow up Contact information: [place] [address] [phone]               [name], DO 08/10/2019, 8:14 PM PGY-1, [place]

## 2019-08-10 NOTE — TOC Initial Note (Signed)
Transition of Care [facility]) - Initial/Assessment Note    Patient Details  Name: [name] MRN: [id number] Date of Birth: 10-25-1945  Transition of Care [facility]) CM/SW Contact:    Emeterio Reeve, [place] Phone Number: 08/10/2019, 1:02 PM  Clinical Narrative:                  CSW met with pt at bedside. CSW introduced self and explained her role at the hospital. Pt was recently discharged to [facility]. Pt stated prior to that she was living home alone and her daughter would visit. Pt plans on returning to [facility] for rehab.   CSW requested PT to see pt so csw can submit insurance auth. CSW is following for pt note. CSW spoke to [facility] and they are willing to take her back.  CSW will continue to follow.   Expected Discharge Plan: Skilled Nursing Facility Barriers to Discharge: Continued Medical Work up   Patient Goals and CMS Choice Patient states their goals for this hospitalization and ongoing recovery are:: To get better CMS Medicare.gov Compare Post Acute Care list provided to:: Patient Choice offered to / list presented to : Patient  Expected Discharge Plan and Services Expected Discharge Plan: [place]       Living arrangements for the past 2 months: Apartment                                      Prior Living Arrangements/Services Living arrangements for the past 2 months: Apartment Lives with:: Self   Do you feel safe going back to the place where you live?: Yes      Need for Family Participation in Patient Care: Yes (Comment) Care giver support system in place?: Yes (comment) Current home services: DME Criminal Activity/Legal Involvement Pertinent to Current Situation/Hospitalization: No - Comment as needed  Activities of Daily Living Home Assistive Devices/Equipment: Walker (specify type) ADL Screening (condition at time of admission) Patient's cognitive ability adequate to safely complete daily activities?: Yes Is the patient deaf or  have difficulty hearing?: No Does the patient have difficulty seeing, even when wearing glasses/contacts?: Yes Does the patient have difficulty concentrating, remembering, or making decisions?: No Patient able to express need for assistance with ADLs?: Yes Does the patient have difficulty dressing or bathing?: Yes Independently performs ADLs?: No Communication: Independent Is this a change from baseline?: Pre-admission baseline Grooming: Needs assistance Is this a change from baseline?: Pre-admission baseline Feeding: Independent Bathing: Needs assistance Is this a change from baseline?: Pre-admission baseline Toileting: Needs assistance Is this a change from baseline?: Pre-admission baseline In/Out Bed: Needs assistance Walks in Home: Needs assistance Is this a change from baseline?: Change from baseline, expected to last >3 days Does the patient have difficulty walking or climbing stairs?: Yes Weakness of Legs: Both Weakness of Arms/Hands: Both  Permission Sought/Granted   Permission granted to share information with : Yes, Verbal Permission Granted     Permission granted to share info w AGENCY: ANF        Emotional Assessment Appearance:: Appears stated age Attitude/Demeanor/Rapport: Engaged Affect (typically observed): Appropriate Orientation: : Oriented to Situation, Oriented to  Time, Oriented to Place, Oriented to Self Alcohol / Substance Use: Not Applicable Psych Involvement: No (comment)  Admission diagnosis:  Hypoxia [R09.02] Patient Active Problem List   Diagnosis Date Noted  . Acute on chronic combined systolic and diastolic CHF (congestive heart failure) ([place])  08/09/2019  . Contamination of blood culture 08/09/2019  . Hypoxia 08/08/2019  . Mitral valve disorder   . Hypertension, essential   . CVA (cerebral vascular accident) ([place]) 08/01/2019  . Pain due to onychomycosis of toenails of both feet 08/31/2018  . Oxygen dependent   . Fever   . Decreased range  of motion of left shoulder   . Drug-induced neutropenia ([place])   . Other pancytopenia ([place])   . Methotrexate toxicity   . 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 Acute diverticulitis   . Ventral hernia without obstruction or gangrene   . Cellulitis of leg, left   . Morbid obesity ([place])   . Diverticulitis 06/18/2017  . 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 S/P revision of total knee 10/24/2015   PCP:  Dixie Dials, MD Pharmacy:   [facility], [place] [address] Phone: [phone] Fax: [place], [place] - [address] [place] [address] Phone: [phone] Fax: [phone]     Social Determinants of Health (SDOH) Interventions    Readmission Risk Interventions No flowsheet data found.  Emeterio Reeve, Latanya Presser, [place] Social Worker [phone]

## 2019-08-10 NOTE — Hospital Course (Addendum)
[name] is a 74 y.o. female who presented 08/09/2019 after a positive result on blood culture post discharge as well as acute hypoxemic respiratory failure requiring 2L oxygen.  PMH is significant for CVA, hyperlipidemia, hypertension, anemia, and rheumatoid arthritis.   Acute CVA with history of recent CVA on Eliquis for DVT ppx On Day 5 of hospitalization, patient developed global aphasia, acute right-sided facial, hand and leg weakness. STAT MRI of brain was obtained which was significant for marked interval increase in extent of an acute/early subacute left MCA territory cortical/subcortical infarct. A moderate to large confluent infarct was now present within the left temporal and parietal lobes as well as posterior left insula. Neuro was consulted and suspect that the Patient was on Eliquis for DVT noted at previous hospitalization; however this was discontinued post- CVA. Patient was switched to Heparin gtt per stroke protocol. No hemorrhagic event occurred within 3 days Eliquis was restarted for DVT prophylaxis. 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 they felt that [facility] felt that patient is not a good surgical candidiate for removal of fibroelastoma due to poor general medical condition and poor ability to withstand surgery. PT and OT followed patient and felt Right sided weakness improved with rehab and would continue to benefit after discharge. Patient continued to have global aphasia throughout admission and it is SLP recommendation that she continue to receive Speech Therapy after discharge.  Patient was approved CIR by  hospital screening. Peer-to-peer was done to advocated for CIR at discharge, but patient was denied. Palliative care was consulted and discussed with patient's daughter goals of care and prognosis. Patient was pre-approved for CIR but denied by insurance, despite peer-to-peer and PMR consult supporting it. POA settled on SNF for patient.   AKI on CKD 2 Patient received 40 IV Lasix x2, due to concern for CHF exacerbation at presentation. Patient developed AKI after second Lasix dose and received fluid bolus. Patient Cr went back to baseline with bolus. Cr crept up again with decrease fluid intake likely secondary impaired cognitive function. Fluid bolus given and emphasis was placed on fluid intake at meal times.    Severe RV dysfunction with cor pulmonale  Group III PAH BNP 1,197 at admission, previously 550 in 2015. Unsure of patients baseline.   7/8 echo moderately reduced RV function with associated pulmonary hypertension. 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 This admission she received 40 IV Lasix x3 with good fluid output. Ambulatory pulse oximetry showed de-sats to 81%. Patient required 1-2L while ambulating.  Patient required 0-2L [place] at night. Post CVA patient was on RA and consistently satting >90%. She has a significant smoking history and has previously been recommended to follow-up with pulmonology for PFTs per cardiology.  Referral placed for pulmonology last admission.    Productive Cough Patient with yellow productive sputum. Questionable whether cough began last admission or has been chronic. CXR showed multifocal airspace opacities which could be due to asymmetric edema and/or infectious etiology. This was unchanged from prior CT. Due to uncertainty, patient was started on 100 mg Doxycycline x5 days (7/13-7/17) to treat CAP. Sputum changed from yellow to clear. Cough ceased  by discharge.   Hx Positive Blood Cx  Patient with blood cultures  positive for corynebacterium on 7/8. Repeat blood cultures performed on 7/12 showed no growth in 2 days. Original culture likely a contaminant. Patient afebrile on admission, VSS. No systemic signs of infection.

## 2019-08-10 NOTE — Progress Notes (Signed)
Family Medicine Teaching Service Daily Progress Note Intern Pager: [phone]  Patient name: [name] Medical record number: [id number] Date of birth: September 09, 1945 Age: 74 y.o. Gender: female  Primary Care Provider: Dixie Dials, MD Consultants: None Code Status: FULL  Pt Overview and Major Events to Date:  Admitted 7/12  Assessment and Plan: [name] Hooksis a 74 y.o.femalepresenting after a positive result on Blood culture post discharge to a SNF. PMH is significant for CVA,hyperlipidemia, hypertension, anemia, and rheumatoid arthritis.  Hypoxia likely secondary toCHF exacerbation- HFpEF.BNP is 1,197 at admission, last BNP was 550 in 2015.WBC 7.1, electrolytes WNL.  I&O from yesterday -195. Creatinine 1.12 > 1.52.  - Stop diuresis  - Monitor O2 requirements, wean to room air as tolerated - ambulate with pulse ox if able to ambulate - Strict I/Os -Daily weights - monitor pulse ox  Productive Cough  Patient reports continued cough since last admission. Yellow productive sputum, now clear. Believes that her cough is improving. Afebrile, WBC within normal limits. Imaging notable for multifocal airspace opacities which could be due to asymmetric edema and/or infectious etiology.  - Will treat for HAP considering patient states cough began last admission - Doxycycline 100 mg q12 hours through 7/17  Positive Blood Culturelikely contaminant Patient was discharged to SNF with cultures pending; however they came back positive for corynebacterium. Repeat culture has been ordered to confirm if this is a true positive or possible contaminant.  - no growth in 2 days; original culture was a contaminant -CBC this AM with WBC 7.4, hemoglobin 10.6  History of CVA-Patient was recently discharged from the service after CVA. 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 Patient received repeat CT  angiography of chest with contrast to r/o PE. -Continue Eliquis, Crestor -PT/OT at SNF -Follow-up neurology outpatient  L leg erythema- improved Patient was concerned about the increased swelling her legs. She also had slight erythema of the lower left shin and increased pain to palpation of the area. The patient was found to have a Right peroneal DVT, of indeterminate age during most recent hospitalization and was subsequently placed on Eliquis. Patient has been taking her Eliquis as prescribed. She was reporting SOB, but no chest pain. CT Angio at admission was negative for PE.  - Continue Eliquis  HTN- chronic Patient takes metoprolol 12.5 BID. BP range 158/78-188/74. Wide pulse pressure is likely secondary to chronic HTN. - Continue home medication - Monitor BP  CKD Stage II Cr is 1.18 > 1.12 > 1.52. Will stop diuresis.   Rheumatoid arthritis- stable, chronic Patient takes prednisone, leflunomide, and hydroxychloroquine.  - Continue home meds - Start Protonix  Hyperlipidemia- stable and chronic Patient is post- CVA and has a hx HLD. Patient takes rosuvastatin at home. - Continue home meds  Chronic pain syndrome Takes norco PRN at home, PMP aware reviewed and appropriate. - Continue home regimen  Hypothyroidism- Chronic and stable Patient takes levothyroxine. - Continue home medication  FEN/GI:Heart Prophylaxis:Eliquis  Disposition: Likely discharge today  Subjective:  Patient does not have any complaints today. States her cough is improved. Her sputum is now clear and was previously yellow yesterday. Denies SOB, chest pain, abdominal pain.   Objective: Temp:  [98 F (36.7 C)-98.5 F (36.9 C)] 98 F (36.7 C) (07/13 2137) Pulse Rate:  [72-83] 75 (07/13 2137) Resp:  [16-18] 18 (07/13 2137) BP: (106-158)/(53-75) 137/65 (07/13 2137) SpO2:  [91 %-100 %]  96 % (07/13 2137) Physical Exam: General: obese, no acute distress Cardiovascular: RRR, no  murmurs Respiratory: CTAB no wheezing or crackles appreciated Abdomen: Obese, colostomy bag LLQ Extremities: 2+ DP, radial pulses   Laboratory: Recent Labs  Lab 08/05/19 0520 08/08/19 1625 08/09/19 0247  WBC 8.6 7.1 7.1  HGB 9.1* 11.3* 11.1*  HCT 31.5* 38.3 37.3  PLT 248 299 304   Recent Labs  Lab 08/05/19 0520 08/08/19 1625 08/09/19 0247  NA 141 141 142  K 4.5 4.9 4.5  CL 109 106 106  CO2 _0 BUN _1 CREATININE 1.02* 1.18* 1.12*  CALCIUM 8.8* 9.3 9.4  GLUCOSE 91 92 85    Imaging/Diagnostic Tests: CT Angtio Chest PE with Contrast 7/14 IMPRESSION: Stable asymmetric bibasilar consolidation, most in keeping with aspiration or infection in the acute setting. No pulmonary embolism. Morphologic changes in keeping with pulmonary arterial hypertension and elevated right heart pressure. Superimposed extensive coronary artery calcification. Aortic Atherosclerosis (ICD10-I70.0).  Chest 1 View 7/12 IMPRESSION: As on recent CT multifocal airspace opacities which could be due to asymmetric edema and/or infectious etiology. Small right pleural effusion.  [name], DO 08/10/2019, 5:15 AM PGY-1, [place] Intern pager: [phone], text pages welcome

## 2019-08-11 ENCOUNTER — Observation Stay (HOSPITAL_COMMUNITY): Payer: Medicare Other

## 2019-08-11 DIAGNOSIS — I5043 Acute on chronic combined systolic (congestive) and diastolic (congestive) heart failure: Secondary | ICD-10-CM | POA: Diagnosis not present

## 2019-08-11 LAB — BASIC METABOLIC PANEL
Anion gap: 10 (ref 5–15)
Anion gap: 8 (ref 5–15)
BUN: 35 mg/dL — ABNORMAL HIGH (ref 8–23)
BUN: 35 mg/dL — ABNORMAL HIGH (ref 8–23)
CO2: 31 mmol/L (ref 22–32)
CO2: 28 mmol/L (ref 22–32)
Calcium: 8.7 mg/dL — ABNORMAL LOW (ref 8.9–10.3)
Calcium: 8.4 mg/dL — ABNORMAL LOW (ref 8.9–10.3)
Chloride: 99 mmol/L (ref 98–111)
Chloride: 103 mmol/L (ref 98–111)
Creatinine, Ser: 2.01 mg/dL — ABNORMAL HIGH (ref 0.44–1.00)
Creatinine, Ser: 1.8 mg/dL — ABNORMAL HIGH (ref 0.44–1.00)
GFR calc Af Amer: 28 mL/min — ABNORMAL LOW (ref >60)
GFR calc Af Amer: 32 mL/min — ABNORMAL LOW (ref >60)
GFR calc non Af Amer: 24 mL/min — ABNORMAL LOW (ref >60)
GFR calc non Af Amer: 28 mL/min — ABNORMAL LOW (ref >60)
Glucose, Bld: 98 mg/dL (ref 70–99)
Glucose, Bld: 102 mg/dL — ABNORMAL HIGH (ref 70–99)
Potassium: 4.4 mmol/L (ref 3.5–5.1)
Potassium: 4.6 mmol/L (ref 3.5–5.1)
Sodium: 140 mmol/L (ref 135–145)
Sodium: 139 mmol/L (ref 135–145)

## 2019-08-11 LAB — CBC
HCT: 35.9 % — ABNORMAL LOW (ref 36.0–46.0)
Hemoglobin: 10.8 g/dL — ABNORMAL LOW (ref 12.0–15.0)
MCH: 25.8 pg — ABNORMAL LOW (ref 26.0–34.0)
MCHC: 30.1 g/dL (ref 30.0–36.0)
MCV: 85.7 fL (ref 80.0–100.0)
Platelets: 303 10*3/uL (ref 150–400)
RBC: 4.19 MIL/uL (ref 3.87–5.11)
RDW: 14.6 % (ref 11.5–15.5)
WBC: 7.1 10*3/uL (ref 4.0–10.5)
nRBC: 0 % (ref 0.0–0.2)

## 2019-08-11 IMAGING — DX DG CHEST 1V PORT
1 series · 1 of 1 positions shown · non-contrast
Comparison: [DATE] chest radiograph and CT angiogram chest

CLINICAL DATA: Shortness of breath

EXAM:
PORTABLE CHEST 1 VIEW

[chest ap]
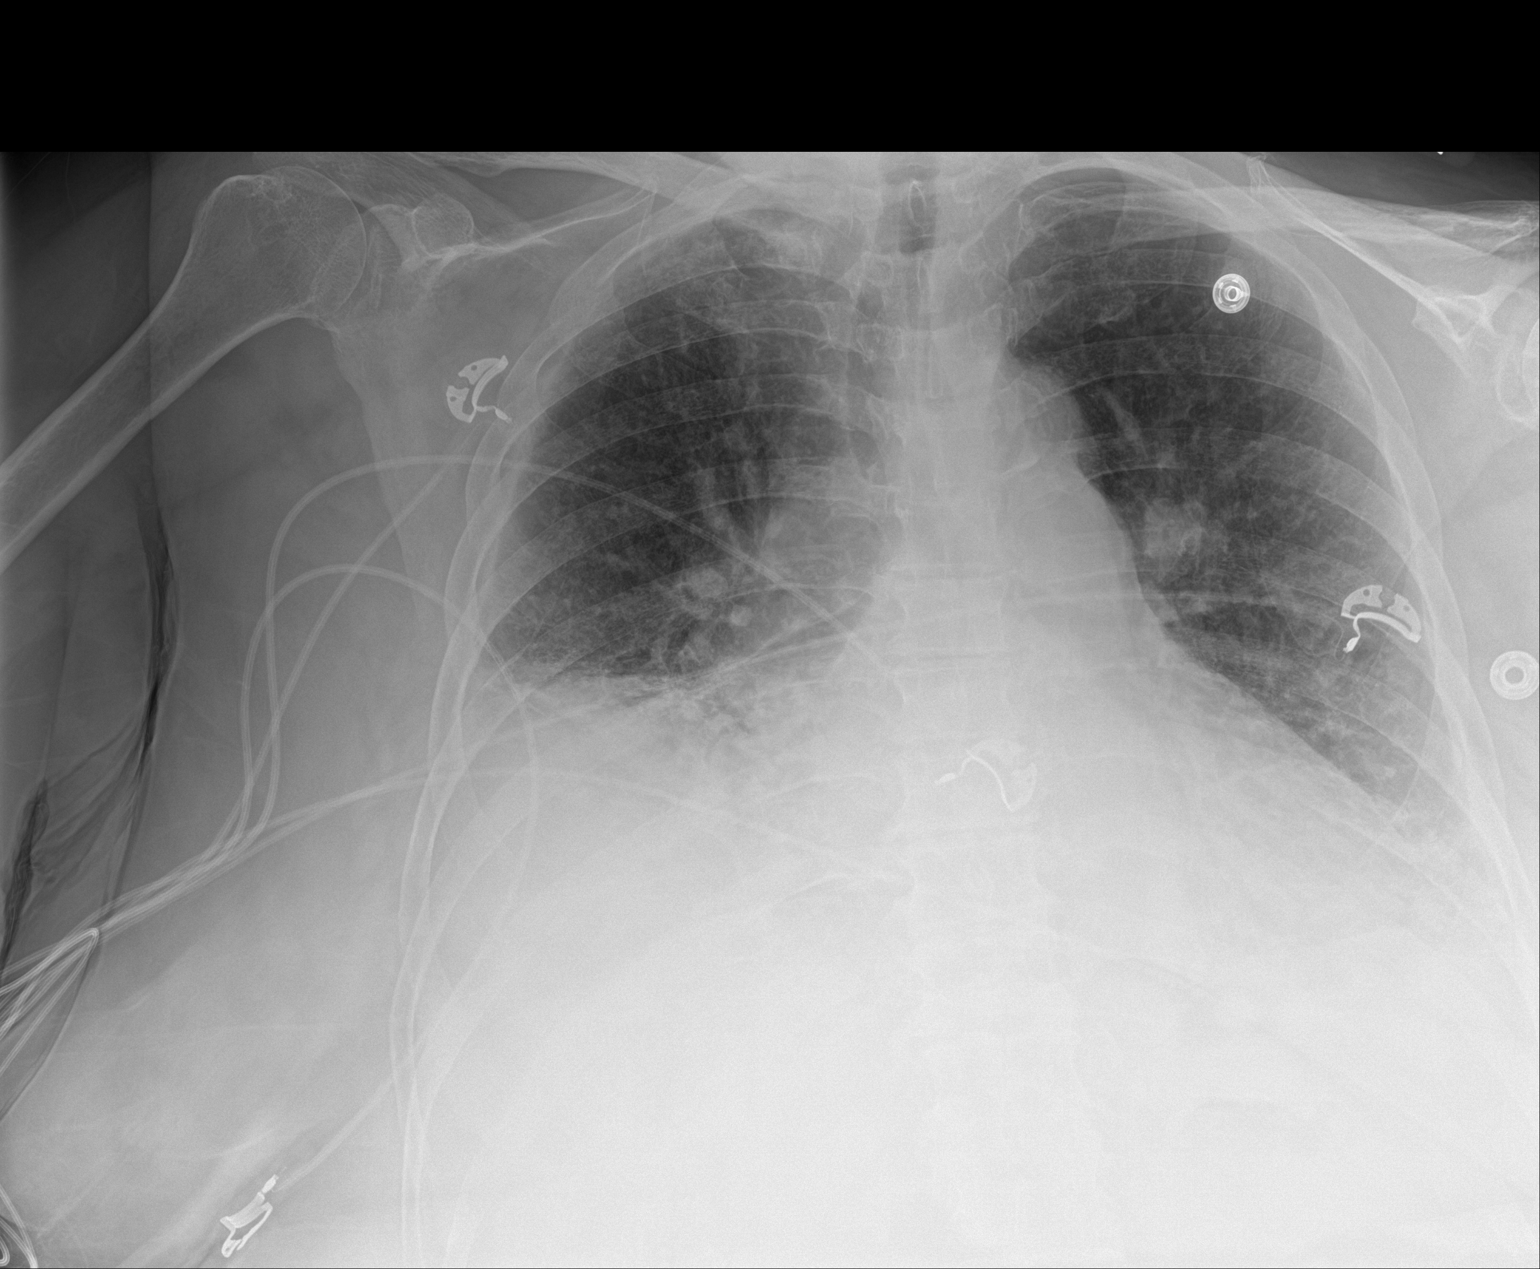

[1 of 1 positions shown; findings below may reference images not displayed]

FINDINGS: There is airspace opacity in the lung bases, similar to recent
studies with small right pleural effusion. Heart is mildly enlarged
with pulmonary vascularity normal. No adenopathy. There is aortic
atherosclerosis. No bone lesions.
IMPRESSION: Patchy airspace opacity in the lung bases with small right pleural
effusion. Stable cardiac prominence. No new opacity evident.

Aortic Atherosclerosis ([3V]-[3V]).

## 2019-08-11 MED ORDER — SODIUM CHLORIDE 0.9 % IV BOLUS
500.0000 mL | Freq: Once | INTRAVENOUS | Status: AC
  Administered 2019-08-11: 500 mL via INTRAVENOUS

## 2019-08-11 NOTE — Progress Notes (Signed)
Family Medicine Teaching Service Daily Progress Note Intern Pager: [phone]  Patient name: [name] Medical record number: [id number] Date of birth: 10-Jul-1945 Age: 74 y.o. Gender: female  Primary Care Provider: Dixie Dials, MD Consultants: None Code Status: FULL  Pt Overview and Major Events to Date:  Admitted 7/12  Assessment and Plan: Amena Dockham Hooksis a 74 y.o.femalepresenting after a positive result on Blood culture post discharge to a SNF.PMH is significant for CVA,hyperlipidemia, hypertension, anemia, and rheumatoid arthritis.  Hypoxia likely secondary toCHF exacerbation- HFpEF.BNP is 1,197 at admission, last BNP was 550 in 2015.WBC 7.1, electrolytes WNL.  I&O from today -300. Creatinine 1.12 > 1.52 > 2.01.  - Monitor O2 requirements - ambulatory pulse ox yesterday showed desats to 81%, plan to repeat today. - Strict I/Os; I/O yesterday -300 - Daily weights   AKI Creatinine 1.12 > 1.52 > 2.01. Received 40 IV Lasix x2 this admission. Could also be contrast-induced.  I/O -300 today.  - Stop diuresis  - 500 cc NS bolus - repeat BMP after fluids  Productive Cough  Patient reports continued cough since last admission. Yellow productive sputum, now clear. Believes that her cough is improving. Afebrile, WBC within normal limits. Imaging notable formultifocal airspace opacities which could be due to asymmetric edema and/or infectious etiology. - Will treat for HAP considering patient states cough began last admission - Doxycycline 100 mg q12 hours through 7/17  Positive Blood Culturelikely contaminant Patient was discharged to SNF with cultures pending; however they came back positive for corynebacterium. Repeat culture has been ordered to confirm if this is a true positive or possible contaminant.  - no growth in 2 days; original culture was a contaminant -CBC this AM with WBC 7.4, hemoglobin 10.6  History of CVA-Patient was recently discharged from the  service after CVA. 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 Patient received repeat CT angiography of chest with contrast to r/o PE. -Continue Eliquis, Crestor -PT/OT at SNF -Follow-up neurology outpatient  L leg erythema- improved Patient was concerned about the increased swelling her legs. She also had slight erythema of the lower left shin and increased pain to palpation of the area. The patient was found to have a Right peroneal DVT, of indeterminate age during most recent hospitalization and was subsequently placed on Eliquis. Patient has been taking her Eliquis as prescribed. She was reporting SOB, but no chest pain. CT Angio at admission was negative for PE.  - Continue Eliquis  HTN- chronic Patient takes metoprolol 12.5 BID. BP range 158/78-188/74. Wide pulse pressure is likely secondary to chronic HTN. - Continue home medication - Monitor BP  CKD Stage II Cr is 1.18> 1.12 > 1.52. Will stop diuresis.   Rheumatoid arthritis- stable, chronic Patient takes prednisone, leflunomide, and hydroxychloroquine.  - Continue home meds - Start Protonix  Hyperlipidemia- stable and chronic Patient is post- CVA and has a hx HLD. Patient takes rosuvastatin at home. - Continue home meds  Chronic pain syndrome Takes norco PRN at home, PMP aware reviewed and appropriate. - Continue home regimen  Hypothyroidism- Chronic and stable Patient takes levothyroxine. - Continue home medication  FEN/GI:Heart Prophylaxis:Eliquis  Disposition: Plan to discharge to SNF; pending insurance authorization  Subjective:  Patient overall feels "alright" this morning. Her only complaint is that both her hands still feel weak. They have felt weak for almost a month, it is hard for her to grip/grasp objects.  Continues to have a clear, productive cough. No SOB or chest pain.   Objective: Temp:   [98.4 F (36.9 C)-98.5 F (36.9 C)] 98.5 F (36.9 C) (07/15 0754) Pulse Rate:  [71-75] 72 (07/15 0754) Resp:  [13-17] 13 (07/15 0754) BP: (129-130)/(55-61) 129/55 (07/15 0754) SpO2:  [99 %-100 %] 100 % (07/15 0754) Physical Exam: General: No distress, on 1L [place] Cardiovascular: RRR Respiratory: CTAB Abdomen: obese, colostomy bag in place, some tenderness to RLQ Extremities: non pitting edema, left leg tender with palpation   Laboratory: Recent Labs  Lab 08/09/19 0247 08/10/19 0337 08/11/19 0304  WBC 7.1 7.4 7.1  HGB 11.1* 10.6* 10.8*  HCT 37.3 35.6* 35.9*  PLT 304 351 303   Recent Labs  Lab 08/09/19 0247 08/10/19 0337 08/11/19 0304  NA 142 140 140  K 4.5 4.2 4.4  CL 106 100 99  CO2 _0 BUN 15 21 35*  CREATININE 1.12* 1.52* 2.01*  CALCIUM 9.4 8.9 8.7*  GLUCOSE 85 88 98    Imaging/Diagnostic Tests: CT Angtio Chest PE with Contrast 7/14 IMPRESSION: Stable asymmetric bibasilar consolidation, most in keeping with aspiration or infection in the acute setting. No pulmonary embolism. Morphologic changes in keeping with pulmonary arterial hypertension and elevated right heart pressure. Superimposed extensive coronary artery calcification. Aortic Atherosclerosis (ICD10-I70.0).  Chest 1 View 7/12 IMPRESSION: As on recent CT multifocal airspace opacities which could be due to asymmetric edema and/or infectious etiology. Small right pleural effusion.  [name], DO 08/11/2019, 9:46 AM PGY-1, [place] Intern pager: [phone], text pages welcome

## 2019-08-11 NOTE — TOC Progression Note (Signed)
Transition of Care [facility]) - Progression Note    Patient Details  Name: [name] MRN: [id number] Date of Birth: 08-29-1945  Transition of Care [facility]) CM/SW [place], [place] Phone Number: 08/11/2019, 4:56 PM  Clinical Narrative:     PTs insurance Josem Kaufmann is pending.  Expected Discharge Plan: [place] Barriers to Discharge: Continued Medical Work up  Expected Discharge Plan and Services Expected Discharge Plan: [place] arrangements for the past 2 months: Apartment Expected Discharge Date: 08/10/19                                     Social Determinants of Health (SDOH) Interventions    Readmission Risk Interventions No flowsheet data found.  Emeterio Reeve, Latanya Presser, [place] Social Worker [phone]

## 2019-08-12 ENCOUNTER — Observation Stay (HOSPITAL_COMMUNITY): Payer: Medicare Other

## 2019-08-12 ENCOUNTER — Inpatient Hospital Stay (HOSPITAL_COMMUNITY): Payer: Medicare Other

## 2019-08-12 DIAGNOSIS — I6340 Cerebral infarction due to embolism of unspecified cerebral artery: Secondary | ICD-10-CM

## 2019-08-12 DIAGNOSIS — Z9981 Dependence on supplemental oxygen: Secondary | ICD-10-CM | POA: Diagnosis not present

## 2019-08-12 DIAGNOSIS — N179 Acute kidney failure, unspecified: Secondary | ICD-10-CM | POA: Diagnosis present

## 2019-08-12 DIAGNOSIS — G894 Chronic pain syndrome: Secondary | ICD-10-CM | POA: Diagnosis present

## 2019-08-12 DIAGNOSIS — Z6841 Body Mass Index (BMI) 40.0 and over, adult: Secondary | ICD-10-CM | POA: Diagnosis not present

## 2019-08-12 DIAGNOSIS — Z79899 Other long term (current) drug therapy: Secondary | ICD-10-CM | POA: Diagnosis not present

## 2019-08-12 DIAGNOSIS — J9601 Acute respiratory failure with hypoxia: Secondary | ICD-10-CM | POA: Diagnosis present

## 2019-08-12 DIAGNOSIS — L03116 Cellulitis of left lower limb: Secondary | ICD-10-CM | POA: Diagnosis present

## 2019-08-12 DIAGNOSIS — G4733 Obstructive sleep apnea (adult) (pediatric): Secondary | ICD-10-CM | POA: Diagnosis present

## 2019-08-12 DIAGNOSIS — Z86718 Personal history of other venous thrombosis and embolism: Secondary | ICD-10-CM | POA: Diagnosis not present

## 2019-08-12 DIAGNOSIS — I63412 Cerebral infarction due to embolism of left middle cerebral artery: Secondary | ICD-10-CM

## 2019-08-12 DIAGNOSIS — G8191 Hemiplegia, unspecified affecting right dominant side: Secondary | ICD-10-CM | POA: Diagnosis not present

## 2019-08-12 DIAGNOSIS — Z7189 Other specified counseling: Secondary | ICD-10-CM | POA: Diagnosis not present

## 2019-08-12 DIAGNOSIS — M069 Rheumatoid arthritis, unspecified: Secondary | ICD-10-CM | POA: Diagnosis present

## 2019-08-12 DIAGNOSIS — I2723 Pulmonary hypertension due to lung diseases and hypoxia: Secondary | ICD-10-CM | POA: Diagnosis present

## 2019-08-12 DIAGNOSIS — Z20822 Contact with and (suspected) exposure to covid-19: Secondary | ICD-10-CM | POA: Diagnosis present

## 2019-08-12 DIAGNOSIS — R918 Other nonspecific abnormal finding of lung field: Secondary | ICD-10-CM | POA: Diagnosis not present

## 2019-08-12 DIAGNOSIS — D84821 Immunodeficiency due to drugs: Secondary | ICD-10-CM | POA: Diagnosis present

## 2019-08-12 DIAGNOSIS — I5043 Acute on chronic combined systolic (congestive) and diastolic (congestive) heart failure: Secondary | ICD-10-CM | POA: Diagnosis not present

## 2019-08-12 DIAGNOSIS — I63512 Cerebral infarction due to unspecified occlusion or stenosis of left middle cerebral artery: Secondary | ICD-10-CM | POA: Diagnosis not present

## 2019-08-12 DIAGNOSIS — Z515 Encounter for palliative care: Secondary | ICD-10-CM | POA: Diagnosis not present

## 2019-08-12 DIAGNOSIS — I2510 Atherosclerotic heart disease of native coronary artery without angina pectoris: Secondary | ICD-10-CM | POA: Diagnosis present

## 2019-08-12 DIAGNOSIS — N182 Chronic kidney disease, stage 2 (mild): Secondary | ICD-10-CM | POA: Diagnosis present

## 2019-08-12 DIAGNOSIS — R4701 Aphasia: Secondary | ICD-10-CM | POA: Diagnosis not present

## 2019-08-12 DIAGNOSIS — I63312 Cerebral infarction due to thrombosis of left middle cerebral artery: Secondary | ICD-10-CM | POA: Diagnosis not present

## 2019-08-12 DIAGNOSIS — D151 Benign neoplasm of heart: Secondary | ICD-10-CM

## 2019-08-12 DIAGNOSIS — Q211 Atrial septal defect: Secondary | ICD-10-CM | POA: Diagnosis not present

## 2019-08-12 DIAGNOSIS — I130 Hypertensive heart and chronic kidney disease with heart failure and stage 1 through stage 4 chronic kidney disease, or unspecified chronic kidney disease: Secondary | ICD-10-CM | POA: Diagnosis present

## 2019-08-12 DIAGNOSIS — E785 Hyperlipidemia, unspecified: Secondary | ICD-10-CM | POA: Diagnosis present

## 2019-08-12 DIAGNOSIS — Z96653 Presence of artificial knee joint, bilateral: Secondary | ICD-10-CM | POA: Diagnosis present

## 2019-08-12 DIAGNOSIS — R222 Localized swelling, mass and lump, trunk: Secondary | ICD-10-CM | POA: Diagnosis not present

## 2019-08-12 DIAGNOSIS — I1310 Hypertensive heart and chronic kidney disease without heart failure, with stage 1 through stage 4 chronic kidney disease, or unspecified chronic kidney disease: Secondary | ICD-10-CM | POA: Diagnosis present

## 2019-08-12 LAB — URINALYSIS, ROUTINE W REFLEX MICROSCOPIC
Bilirubin Urine: NEGATIVE
Glucose, UA: NEGATIVE mg/dL
Hgb urine dipstick: NEGATIVE
Ketones, ur: NEGATIVE mg/dL
Leukocytes,Ua: NEGATIVE
Nitrite: POSITIVE — AB
Protein, ur: NEGATIVE mg/dL
Specific Gravity, Urine: 1.013 (ref 1.005–1.030)
pH: 9 — ABNORMAL HIGH (ref 5.0–8.0)

## 2019-08-12 LAB — BASIC METABOLIC PANEL
Anion gap: 8 (ref 5–15)
BUN: 29 mg/dL — ABNORMAL HIGH (ref 8–23)
CO2: 29 mmol/L (ref 22–32)
Calcium: 8.8 mg/dL — ABNORMAL LOW (ref 8.9–10.3)
Chloride: 103 mmol/L (ref 98–111)
Creatinine, Ser: 1.57 mg/dL — ABNORMAL HIGH (ref 0.44–1.00)
GFR calc Af Amer: 38 mL/min — ABNORMAL LOW (ref >60)
GFR calc non Af Amer: 33 mL/min — ABNORMAL LOW (ref >60)
Glucose, Bld: 82 mg/dL (ref 70–99)
Potassium: 4.2 mmol/L (ref 3.5–5.1)
Sodium: 140 mmol/L (ref 135–145)

## 2019-08-12 IMAGING — MR MR HEAD W/O CM
4 of 11 series · 17 of 48 positions shown · non-contrast
Comparison: MRI/MRA head [DATE].

CLINICAL DATA: Neuro deficit, acute, stroke suspected. Additional
provided: Worsening right-sided facial droop, decreased use of right
arm.

EXAM:
MRI HEAD WITHOUT CONTRAST
TECHNIQUE: Multiplanar, multiecho pulse sequences of the brain and surrounding
structures were obtained without intravenous contrast.

[Series 2: DWI · axial · 3.0mm · 0.94mm/px · z∈[-73,+57]mm · 10 of 100 slices shown]
[im 1/100]
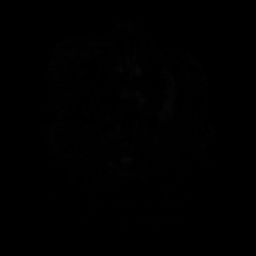
[im 10/100]
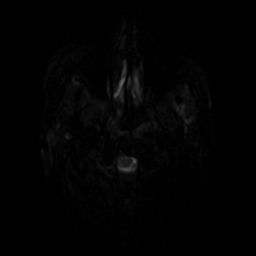
[im 20/100]
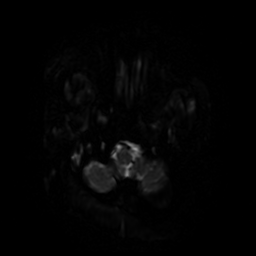
[im 30/100]
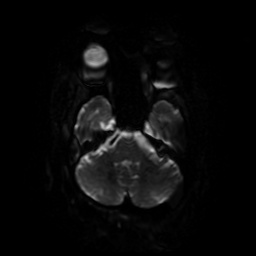
[im 40/100]
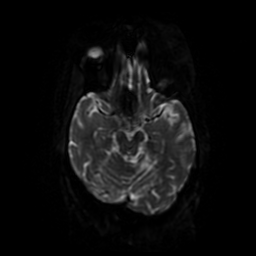
[im 50/100]
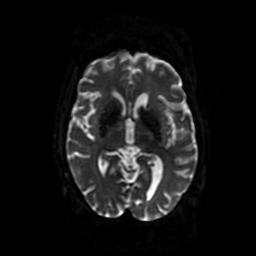
[im 60/100]
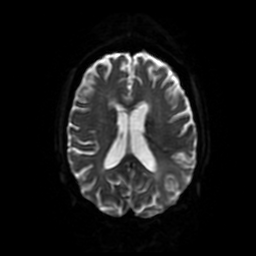
[im 70/100]
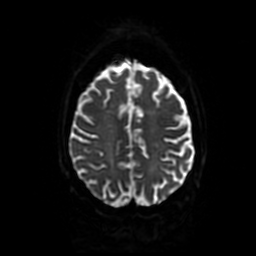
[im 80/100]
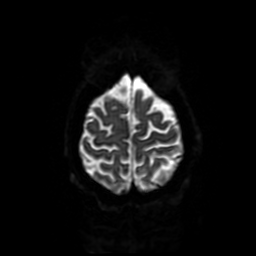
[im 90/100]
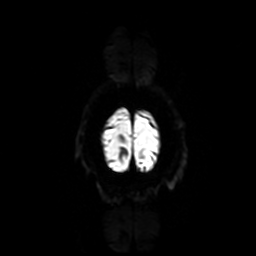

[Series 4: FLAIR · sagittal · 5.0mm · 0.23mm/px · 3 of 23 slices shown (1 of 3)]
[im 1/23]
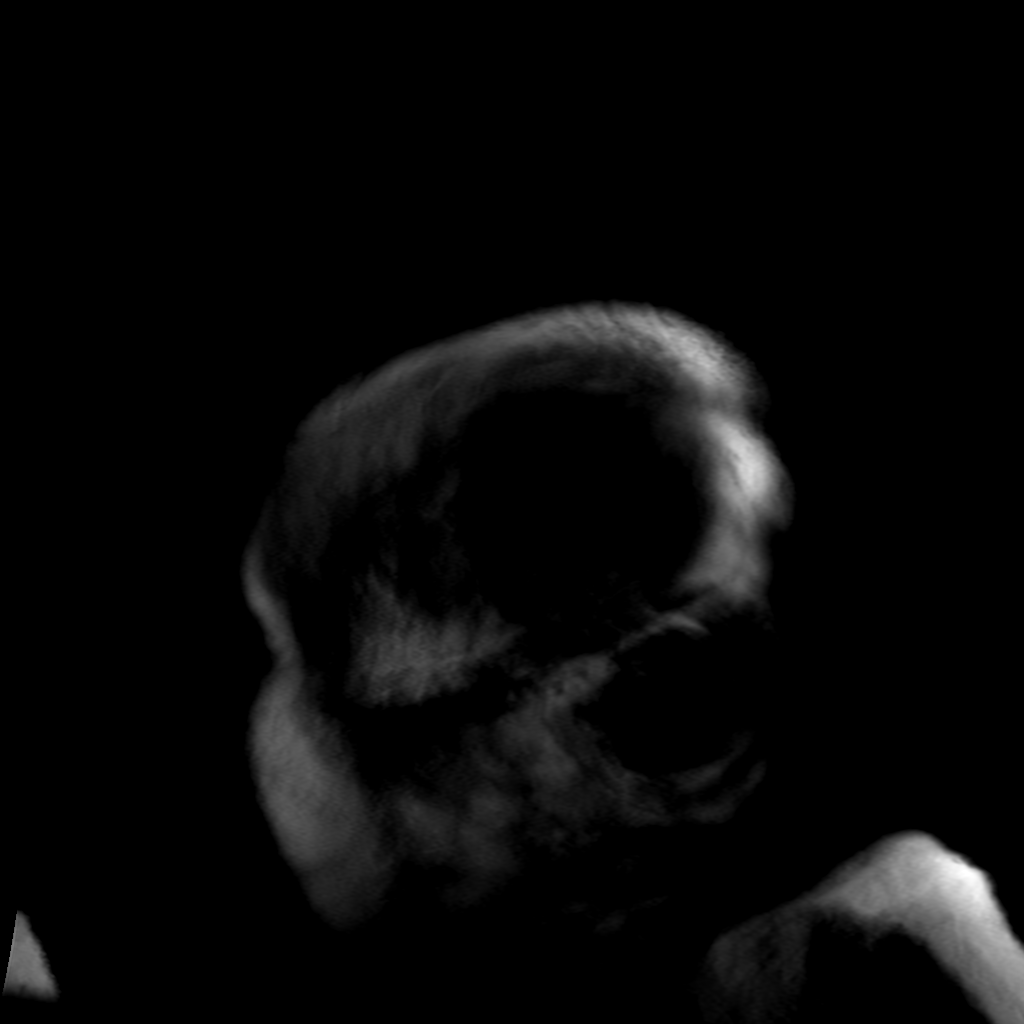
[im 12/23]
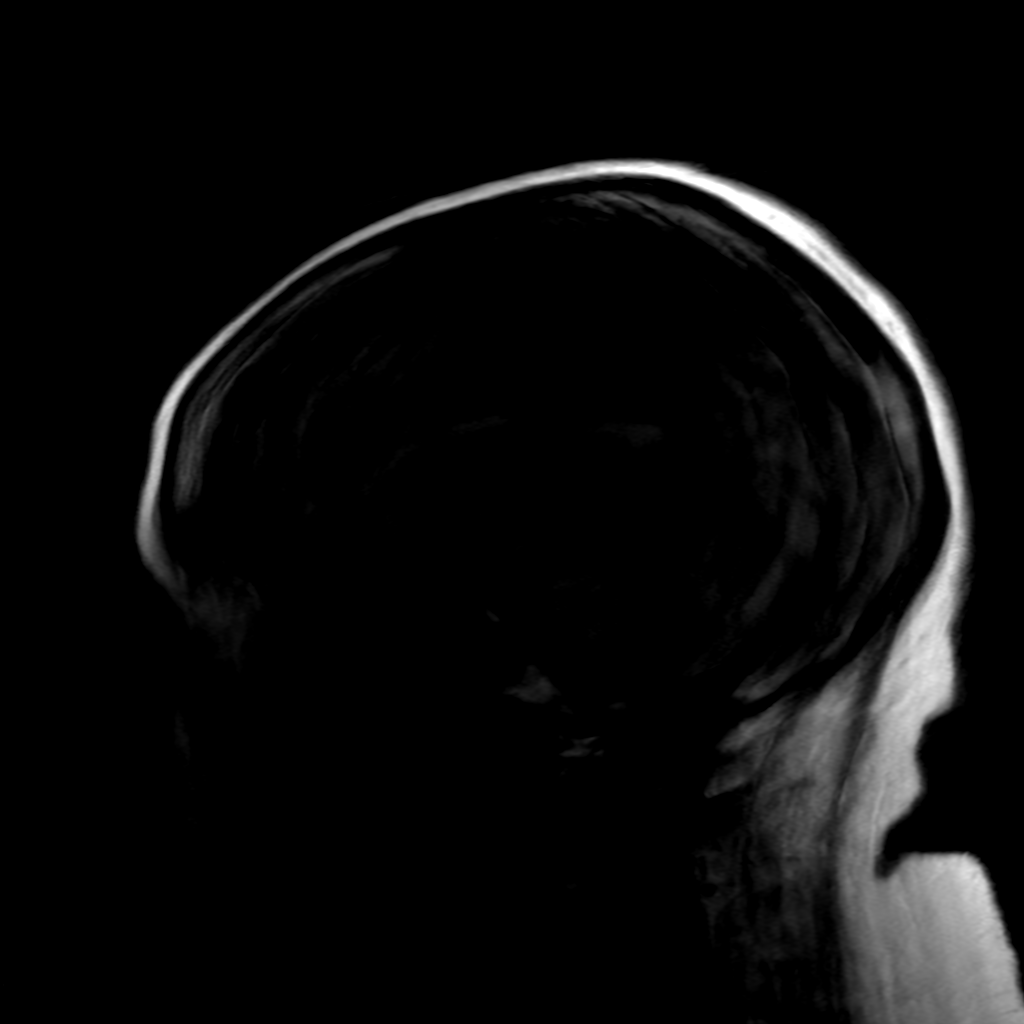
[im 23/23]
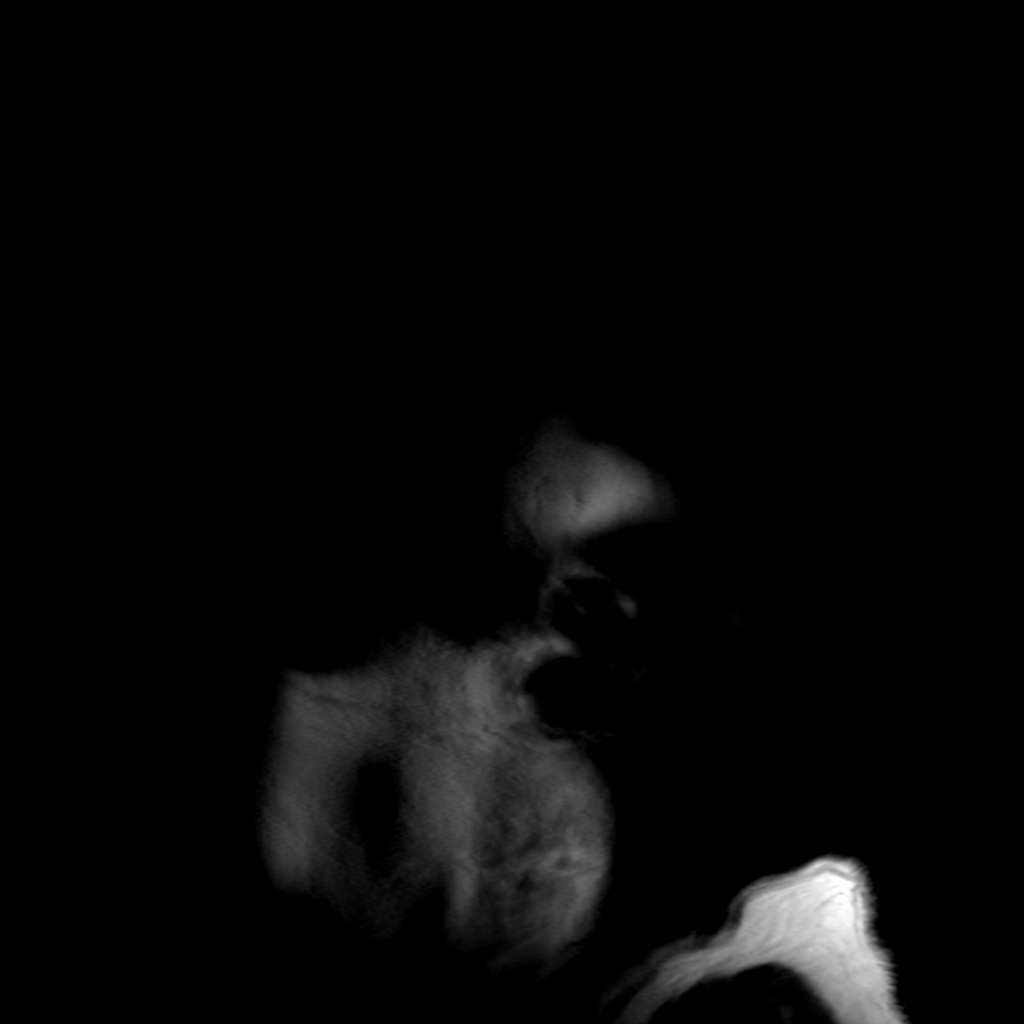

[Series 6: FLAIR · axial · 5.0mm · 0.47mm/px · z∈[-70,+77]mm · 3 of 26 slices shown (2 of 3)]
[im 1/26]
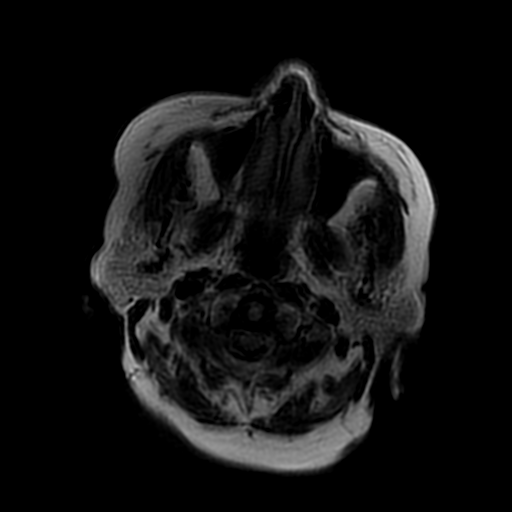
[im 13/26]
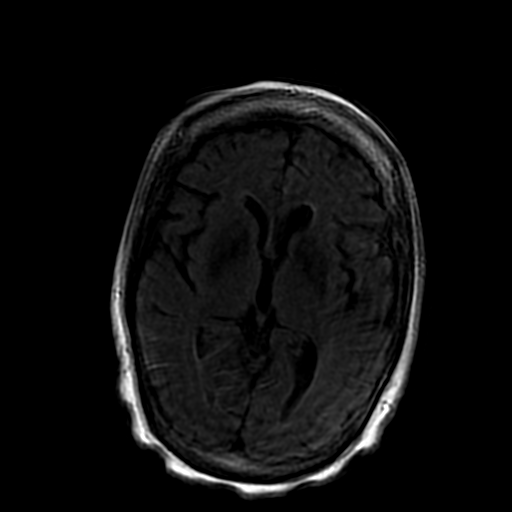
[im 26/26]
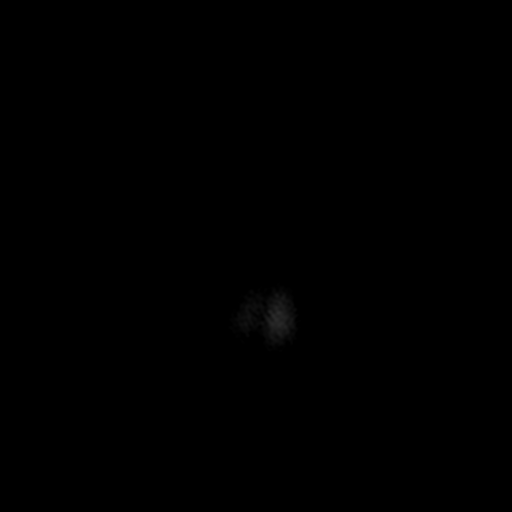

[Series 8: FLAIR · axial · 5.0mm · 0.47mm/px · 1 of 9 slices shown (3 of 3)]
[im 1/9]
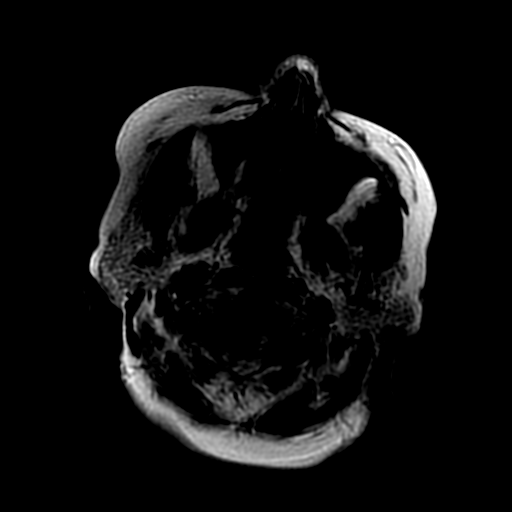

[17 of 48 positions shown; findings below may reference images not displayed]

FINDINGS: Brain:

Multiple sequences are significantly motion degraded. Most notably,
there is moderate motion degradation of the coronal
diffusion-weighted sequence, severe motion degradation of the
sagittal T1 weighted sequence, moderate/severe motion degradation of
the axial T2 weighted sequence, severe motion degradation of the
axial T2/FLAIR sequences, moderate motion degradation of the axial
T1 weighted sequence, severe motion degradation of the coronal T2
weighted sequence.

There has been significant interval progression of an acute/early
subacute left MCA territory infarct as compared to the MRI of
[DATE]. There is now a moderate to large acute/early subacute
cortical/subcortical infarct within the left temporoparietal lobes
and posterior left insula. Redemonstrated small subacute cortically
based infarcts within the left pre and postcentral gyri. New
punctate acute/early subacute infarct within the mid left frontal
lobe (series 2, image 34). A subcentimeter subacute infarct within
the left cerebellar hemisphere was better appreciated on the prior
MRI.

There is no significant mass effect at this time. No ventricular
effacement or midline shift.

Stable generalized parenchymal atrophy.

Tiny chronic lacunar infarcts within the cerebellar hemispheres
bilaterally.

No appreciable chronic intracranial blood products.

No extra-axial fluid collection is identified.

No midline shift.

Vascular: Flow voids poorly assessed due to the degree of motion
degradation.

Skull and upper cervical spine: No focal marrow lesion identified
within the limitations of motion degradation

Sinuses/Orbits: No acute orbital abnormality identified. Mild
ethmoid sinus mucosal thickening. Right mastoid effusion
IMPRESSION: Significantly motion degraded and limited examination as described.

Marked interval increase in extent of an acute/early subacute left
MCA territory cortical/subcortical infarct. A moderate to large
confluent infarct is now present within the left temporal and
parietal lobes as well as posterior left insula. No significant mass
effect at this time.

Redemonstrated small cortically based subacute infarcts within the
left pre and postcentral gyri.

New punctate acute/early subacute infarct within the mid left
frontal lobe white matter.

A subcentimeter subacute infarct within the right cerebellum was
better appreciated on the prior MRI of [DATE].

Stable generalized parenchymal atrophy.

Small chronic infarcts within the bilateral cerebellar hemispheres.

Mild ethmoid sinus mucosal thickening. Right mastoid effusion.

## 2019-08-12 MED ORDER — SODIUM CHLORIDE 0.9 % IV SOLN: INTRAVENOUS | Status: DC

## 2019-08-12 MED ORDER — ASPIRIN 81 MG PO CHEW
324.0000 mg | CHEWABLE_TABLET | Freq: Once | ORAL | Status: DC
  Filled 2019-08-12: qty 4

## 2019-08-12 MED ORDER — HEPARIN (PORCINE) 25000 UT/250ML-% IV SOLN
1200.0000 [IU]/h | INTRAVENOUS | Status: DC
  Administered 2019-08-12: 1000 [IU]/h via INTRAVENOUS
  Administered 2019-08-14 – 2019-08-15 (×2): 1250 [IU]/h via INTRAVENOUS
  Administered 2019-08-15: 1200 [IU]/h via INTRAVENOUS
  Filled 2019-08-12 (×4): qty 250

## 2019-08-12 MED ORDER — ASPIRIN 81 MG PO CHEW
CHEWABLE_TABLET | ORAL | Status: AC
  Filled 2019-08-12: qty 4

## 2019-08-12 MED ORDER — FUROSEMIDE 10 MG/ML IJ SOLN
40.0000 mg | Freq: Every day | INTRAMUSCULAR | Status: DC
  Administered 2019-08-12 – 2019-08-14 (×3): 40 mg via INTRAVENOUS
  Filled 2019-08-12 (×3): qty 4

## 2019-08-12 NOTE — Evaluation (Signed)
Speech Language Pathology Evaluation Patient Details Name: [name] MRN: [id number] DOB: 1945/05/26 Today's Date: 08/12/2019 Time: 5427-0623 SLP Time Calculation (min) (ACUTE ONLY): 17 min  Problem List:  Patient Active Problem List   Diagnosis Date Noted  . Acute on chronic combined systolic and diastolic CHF (congestive heart failure) ([place]) 08/09/2019  . Contamination of blood culture 08/09/2019  . Hypoxia 08/08/2019  . Mitral valve disorder   . Hypertension, essential   . CVA (cerebral vascular accident) ([place]) 08/01/2019  . Pain due to onychomycosis of toenails of both feet 08/31/2018  . Oxygen dependent   . Fever   . Decreased range of motion of left shoulder   . Drug-induced neutropenia ([place])   . Other pancytopenia ([place])   . Methotrexate toxicity   . 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 Acute diverticulitis   . Ventral hernia without obstruction or gangrene   . Cellulitis of leg, left   . Morbid obesity ([place])   . Diverticulitis 06/18/2017  . 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 S/P revision of total knee 10/24/2015   Past Medical History:  Past Medical History:  Diagnosis Date  . Anemia    takes Ferrous Sulfate daily  . Arthritis   . Chronic bronchitis ([place])    Combivent if needed)  . Colostomy care [facility]) 2004  . Coronary artery disease   . Diverticular disease   . Dizziness   . Dry eyes    uses Restasis daily  . H/O hiatal hernia   . History of blood transfusion    no abnormal reaction noted  . History of colon polyps    benign  . Hyperlipidemia    takes Zocor daily  . Hypertension    takes Metoprolol daily  . Hypothyroidism    takes Synthroid daily  . Nocturia    states only d/t being on Lasix  . Numbness and tingling    left foot  . Peripheral edema    takes Lasix daily  . Rheumatoid arthritis(714.0)    Methotrexate 2 times a week  . Shortness of breath    with exertion   Past Surgical History:  Past Surgical History:  Procedure  Laterality Date  . ABDOMINAL HYSTERECTOMY  1965  . CAPSULOTOMY Bilateral 06/01/2012   Procedure: MINOR CAPSULOTOMY;  Surgeon: Myrtha Mantis., MD;  Location: [place];  Service: Ophthalmology;  Laterality: Bilateral;  . CATARACT EXTRACTION W/PHACO  01/01/2011   Procedure: CATARACT EXTRACTION PHACO AND INTRAOCULAR LENS PLACEMENT (IOC);  Surgeon: [name], MD;  Location: [place];  Service: Ophthalmology;  Laterality: Left;  . CATARACT EXTRACTION W/PHACO  12/24/2011   Procedure: CATARACT EXTRACTION PHACO AND INTRAOCULAR LENS PLACEMENT (IOC);  Surgeon: [name], MD;  Location: [place];  Service: Ophthalmology;  Laterality: Right;  . COLON SURGERY  2002   d/t diverticulosis  . COLONOSCOPY    . COLOSTOMY    . ESOPHAGOGASTRODUODENOSCOPY    . EYE SURGERY    . KNEE ARTHROSCOPY     bilateral  . TEE WITHOUT CARDIOVERSION N/A 08/04/2019   Procedure: TRANSESOPHAGEAL ECHOCARDIOGRAM (TEE);  Surgeon: Geralynn Rile, MD;  Location: [place];  Service: Cardiovascular;  Laterality: N/A;  . TONSILLECTOMY    . TOTAL KNEE ARTHROPLASTY  2005/2008   bilateral  . TOTAL KNEE REVISION Right 10/24/2015   Procedure: TOTAL KNEE REVISION;  Surgeon: [name], MD;  Location: [place];  Service: Orthopedics;  Laterality: Right;  . YAG LASER APPLICATION Bilateral 08/01/2829  Procedure: YAG LASER APPLICATION;  Surgeon: Myrtha Mantis., MD;  Location: [place];  Service: Ophthalmology;  Laterality: Bilateral;   HPI:  Pt is a 74 yo female with recent admission 7/5 for stroke. She d/c to SNF 7/10 but returned 7/12 with positive blood culture for corynebacterium. On 7/16 she was found to have increased R facial droop, R weakness, and aphasia. MRI shows increase in extent of acute/early subacute L MCA infarct. A moderate to large confluent infarct is now present within the left temporal and parietal lobes as well as posterior left insula. New punctate acute/early subacute infarct within the mid left frontal  lobe white matter. On 08/02/19 she scored 11/30 on SLUMS (impacted by drowsiness); expressive/receptive language [facility]. PMH includes: HTN, HLD, CAD, anemia, RA, TKR (R), morbid obesity, HH   Assessment / Plan / Recommendation Clinical Impression  Pt has a receptive and expressive fluent aphasia without awareness of errors. She follows one-step commands less than 50% of the time even when given cues and models, and she answers "yes" to all yes/no questions. She can count (1-10) and name the days of the week with Min cues for initiation and minimal phonemic errors. Familiar songs are a little more challenging with more moderate cueing needed, and more paraphasias observed. Her spontaneous speech is filled with neologistic jargon but there are a few automatic phrases that she clearly states. Confrontational naming improved from 0% to about 25% when given sentence completion and phonemic cueing. Repetition is also impaired at the word level. Pt will benefit from SLP f/u for her aphasia. Note that swallow evaluation was deferred as RN completed the [facility] swallow screen, which she will document.     SLP Assessment  SLP Recommendation/Assessment: Patient needs continued Speech Lanaguage Pathology Services SLP Visit Diagnosis: Aphasia (R47.01)    Follow Up Recommendations  Skilled Nursing facility    Frequency and Duration min 2x/week  2 weeks      SLP Evaluation Cognition  Overall Cognitive Status: Difficult to assess (aphasia)       Comprehension  Auditory Comprehension Overall Auditory Comprehension: Impaired Yes/No Questions: Impaired Basic Biographical Questions: 26-50% accurate Basic Immediate Environment Questions: 25-49% accurate Commands: Impaired One Step Basic Commands: 25-49% accurate    Expression Expression Primary Mode of Expression: Verbal Verbal Expression Overall Verbal Expression: Impaired Initiation: No impairment Automatic Speech: Name;Counting;Day of week;Singing Level  of Generative/Spontaneous Verbalization: Phrase Repetition: Impaired Level of Impairment: Word level;Phrase level Naming: Impairment Confrontation: Impaired Verbal Errors: Phonemic paraphasias;Jargon;Not aware of errors Effective Techniques: Sentence completion;Phonemic cues Non-Verbal Means of Communication: Not applicable   Oral / Motor  Motor Speech Overall Motor Speech: Appears within functional limits for tasks assessed   GO                    Osie Bond., M.A. [place] Acute Rehabilitation Services Pager [phone] Office [phone]  08/12/2019, 4:40 PM

## 2019-08-12 NOTE — Progress Notes (Addendum)
Cardiology Consultation:  Patient ID: [name] MRN: [id number]; DOB: 07-01-1945  Admit date: 08/08/2019 Date of Consult: 08/12/2019  Primary Care Provider: Dixie Dials, MD Primary Cardiologist: No primary care provider on file.   Patient Profile:  [name] is a 74 y.o. female with a hx of morbid obesity (BMI 5), CVA, DVT, PFO, HFpEF, severe PHTN with cor pulmonale who is being seen today for the evaluation of mitral valve mass at the request of Chrisandra Netters, MD.  History of Present Illness:  [name] presents with a new stroke. She was admitted 08/06/2019-08/11/2019 with stroke and found to have severe RV dysfunction, a mitral valve papillary fibroelastoma, PFO with DVT.  She appears to been discharged home on anticoagulation.  She was not evaluated by cardiac surgery regarding the papillary fibroblastoma.  It appears electrophysiology was consulted for loop recorder placement but not for evaluation of her papillary fibroblastoma.  She apparently was discharged on 08/11/2019 but then came back to the hospital with concerns for positive blood cultures.  Per the family medicine teaching service I believe these are contaminants.  She then presented again with concerns for stroke.  She was found to have new strokes on brain MRI.  Laboratory data shows stable kidney function with creatinine 1.57.  Electrolytes within normal limits.  Her EGFR is noted to be 38.  Chest x-ray demonstrates small right pleural effusion.  At the time my examination, [name] is in no acute distress.  She is a bit confused on where she is and why she is here.  She also has some difficulty with speech.  She reports she has no chest pain or shortness of breath.  She describes no palpitations.  Heart Pathway Score:       Past Medical History: Past Medical History:  Diagnosis Date  . Anemia    takes Ferrous Sulfate daily  . Arthritis   . Chronic bronchitis ([place])    Combivent if needed)  . Colostomy care  [facility]) 2004  . Coronary artery disease   . Diverticular disease   . Dizziness   . Dry eyes    uses Restasis daily  . H/O hiatal hernia   . History of blood transfusion    no abnormal reaction noted  . History of colon polyps    benign  . Hyperlipidemia    takes Zocor daily  . Hypertension    takes Metoprolol daily  . Hypothyroidism    takes Synthroid daily  . Nocturia    states only d/t being on Lasix  . Numbness and tingling    left foot  . Peripheral edema    takes Lasix daily  . Rheumatoid arthritis(714.0)    Methotrexate 2 times a week  . Shortness of breath    with exertion    Past Surgical History: Past Surgical History:  Procedure Laterality Date  . ABDOMINAL HYSTERECTOMY  1965  . CAPSULOTOMY Bilateral 06/01/2012   Procedure: MINOR CAPSULOTOMY;  Surgeon: Myrtha Mantis., MD;  Location: [place];  Service: Ophthalmology;  Laterality: Bilateral;  . CATARACT EXTRACTION W/PHACO  01/01/2011   Procedure: CATARACT EXTRACTION PHACO AND INTRAOCULAR LENS PLACEMENT (IOC);  Surgeon: [name], MD;  Location: [place];  Service: Ophthalmology;  Laterality: Left;  . CATARACT EXTRACTION W/PHACO  12/24/2011   Procedure: CATARACT EXTRACTION PHACO AND INTRAOCULAR LENS PLACEMENT (IOC);  Surgeon: [name], MD;  Location: [place];  Service: Ophthalmology;  Laterality: Right;  . COLON SURGERY  2002   d/t  diverticulosis  . COLONOSCOPY    . COLOSTOMY    . ESOPHAGOGASTRODUODENOSCOPY    . EYE SURGERY    . KNEE ARTHROSCOPY     bilateral  . TEE WITHOUT CARDIOVERSION N/A 08/04/2019   Procedure: TRANSESOPHAGEAL ECHOCARDIOGRAM (TEE);  Surgeon: Geralynn Rile, MD;  Location: [place];  Service: Cardiovascular;  Laterality: N/A;  . TONSILLECTOMY    . TOTAL KNEE ARTHROPLASTY  2005/2008   bilateral  . TOTAL KNEE REVISION Right 10/24/2015   Procedure: TOTAL KNEE REVISION;  Surgeon: [name], MD;  Location: [place];  Service: Orthopedics;  Laterality: Right;  . YAG LASER  APPLICATION Bilateral 03/05/5168   Procedure: YAG LASER APPLICATION;  Surgeon: Myrtha Mantis., MD;  Location: [place];  Service: Ophthalmology;  Laterality: Bilateral;     Home Medications:  Prior to Admission medications   Medication Sig Start Date End Date Taking? Authorizing Provider  apixaban (ELIQUIS) 5 MG TABS tablet Take 1 tablet (5 mg total) by mouth every 12 (twelve) hours. 08/06/19  Yes [name], DO  cycloSPORINE (RESTASIS) 0.05 % ophthalmic emulsion Place 1 drop into both eyes 3 (three) times daily as needed (dry eyes).    Yes [provider]  etanercept (ENBREL) 50 MG/ML injection Inject 50 mg into the skin every Friday.   Yes [provider]  folic acid (FOLVITE) 1 MG tablet Take 1 mg by mouth daily.     Yes [provider]  HYDROcodone-acetaminophen (NORCO) 10-325 MG tablet Take 1 tablet by mouth 3 (three) times daily as needed for severe pain.    Yes [provider]  hydroxychloroquine (PLAQUENIL) 200 MG tablet Take 200 mg by mouth 2 (two) times daily. 07/25/19  Yes [provider]  leflunomide (ARAVA) 10 MG tablet Take 10 mg by mouth daily. 07/25/19  Yes [provider]  levothyroxine (SYNTHROID, LEVOTHROID) 50 MCG tablet Take 50 mcg by mouth daily.     Yes [provider]  metoprolol tartrate (LOPRESSOR) 25 MG tablet Take 0.5 tablets (12.5 mg total) by mouth 2 (two) times daily. 08/06/19  Yes Beard, Samantha N, DO  predniSONE (DELTASONE) 5 MG tablet Take 5 mg by mouth daily. 07/25/19  Yes [provider]  rosuvastatin (CRESTOR) 20 MG tablet Take 1 tablet (20 mg total) by mouth daily at 6 PM. 08/06/19  Yes [name], DO  acetaminophen (TYLENOL) 500 MG tablet Take 2 tablets (1,000 mg total) by mouth every 6 (six) hours as needed for mild pain (or Fever >/= 101). 08/10/19   Autry-Lott, Naaman Plummer, DO  doxycycline (VIBRA-TABS) 100 MG tablet Take 1 tablet (100 mg total) by mouth every 12 (twelve) hours.  Your whole course of this antibiotic is five days. Upon discharge, you will only have three days left. We have included only enough antibiotic for the remaining three days. 08/10/19   Autry-Lott, Naaman Plummer, DO  furosemide (LASIX) 20 MG tablet Take 1 tablet (20 mg total) by mouth every other day. 08/10/19   Autry-Lott, Naaman Plummer, DO  polyethylene glycol (MIRALAX / GLYCOLAX) 17 g packet Take 17 g by mouth daily as needed for mild constipation. 08/10/19   Autry-Lott, Naaman Plummer, DO  tiotropium (SPIRIVA HANDIHALER) 18 MCG inhalation capsule Place 1 capsule (18 mcg total) into inhaler and inhale daily. 08/10/19   Autry-Lott, Naaman Plummer, DO    Inpatient Medications: Scheduled Meds: . doxycycline  100 mg Oral Q12H  . hydroxychloroquine  200 mg Oral BID  . leflunomide  10 mg Oral Daily  .  levothyroxine  50 mcg Oral Q0600  . metoprolol tartrate  12.5 mg Oral BID  . predniSONE  5 mg Oral Daily  . rosuvastatin  20 mg Oral q1800   Continuous Infusions: . heparin     PRN Meds: acetaminophen **OR** acetaminophen, cycloSPORINE, HYDROcodone-acetaminophen, polyethylene glycol  Allergies:    No Known Allergies  Social History:   Social History   Socioeconomic History  . Marital status: Single    Spouse name: Not on file  . Number of children: Not on file  . Years of education: Not on file  . Highest education level: Not on file  Occupational History  . Not on file  Tobacco Use  . Smoking status: Former Smoker    Quit date: 10/22/2002    Years since quitting: 16.8  . Smokeless tobacco: Never Used  . Tobacco comment: quit smoking in 2004  Substance and Sexual Activity  . Alcohol use: No    Comment: quit drinking in 2004  . Drug use: No  . Sexual activity: Never    Birth control/protection: Surgical  Other Topics Concern  . Not on file  Social History Narrative  . Not on file   Social Determinants of Health   Financial Resource Strain:   . Difficulty of Paying Living Expenses:   Food Insecurity:     . Worried About Charity fundraiser in the Last Year:   . Arboriculturist in the Last Year:   Transportation Needs:   . Film/video editor (Medical):   Marland Kitchen Lack of Transportation (Non-Medical):   Physical Activity:   . Days of Exercise per Week:   . Minutes of Exercise per Session:   Stress:   . Feeling of Stress :   Social Connections:   . Frequency of Communication with Friends and Family:   . Frequency of Social Gatherings with Friends and Family:   . Attends Religious Services:   . Active Member of Clubs or Organizations:   . Attends Archivist Meetings:   Marland Kitchen Marital Status:   Intimate Partner Violence:   . Fear of Current or Ex-Partner:   . Emotionally Abused:   Marland Kitchen Physically Abused:   . Sexually Abused:      Family History:    Family History  Problem Relation Age of Onset  . Anesthesia problems Neg Hx   . Hypotension Neg Hx   . Malignant hyperthermia Neg Hx   . Pseudochol deficiency Neg Hx      ROS:  All other ROS reviewed and negative. Pertinent positives noted in the HPI.     Physical Exam/Data:   Vitals:   08/12/19 0454 08/12/19 0759 08/12/19 1158 08/12/19 1515  BP: (!) 141/54 (!) 142/104 (!) 156/83 (!) 159/87  Pulse: 66 87 79 76  Resp: _0 Temp: 98.1 F (36.7 C)   98.1 F (36.7 C)  TempSrc: Oral     SpO2: 97% 93% 91% 98%  Weight:      Height:         Intake/Output Summary (Last 24 hours) at 08/12/2019 1618 Last data filed at 08/12/2019 1500 Gross per 24 hour  Intake 120 ml  Output 1750 ml  Net -1630 ml    Last 3 Weights 08/08/2019 08/04/2019 08/01/2019  Weight (lbs) 287 lb 14.7 oz 288 lb 282 lb 10.1 oz  Weight (kg) 130.6 kg 130.636 kg 128.2 kg    Body mass index is 51 kg/m.   General: Well nourished, well  developed, in no acute distress Head: Atraumatic, normal size  Eyes: PEERLA, EOMI  Neck: Supple, JVD 8-10 cm H20 Endocrine: No thryomegaly Cardiac: Normal S1, S2; RRR; 2/6 HSM Lungs: diminished breath sounds bilaterally   Abd: Soft, nontender, no hepatomegaly  Ext: 1+ edema  Musculoskeletal: No deformities, BUE and BLE strength normal and equal Skin: Warm and dry, no rashes   Neuro: Alert, awake, oriented to person only   EKG:  The EKG was personally reviewed and demonstrates:  NSR 82 bpm, RBBB, RVH with repol Telemetry:  Telemetry was personally reviewed and demonstrates:  SR 80s  Relevant CV Studies: TTE 08/02/2019 1. Vigorous LV systolic function; grade 1 diastolic dysfunction; mild  LVH; D shaped septum suggesting pulmonary hypertension; mildly elevated  velocity across aortic valve suggesting mild AS but partially explained by  vigorous LV function; mild AI; mild  RAE; moderate RVE with severe RV dysfunction; moderate TR with severe  pulmonary hypertension. There is an oscillating density on MV; cannot R/O  vegetation; suggest TEE to further assess.  2. Left ventricular ejection fraction, by estimation, is 60 to 65%. The  left ventricle has normal function. The left ventricle has no regional  wall motion abnormalities. There is mild left ventricular hypertrophy.  Left ventricular diastolic parameters  are consistent with Grade I diastolic dysfunction (impaired relaxation).  3. Right ventricular systolic function is severely reduced. The right  ventricular size is moderately enlarged. There is severely elevated  pulmonary artery systolic pressure.  4. Right atrial size was mildly dilated.  5. The mitral valve is normal in structure. No evidence of mitral valve  regurgitation. No evidence of mitral stenosis.  6. Tricuspid valve regurgitation is moderate.  7. The aortic valve is tricuspid. Aortic valve regurgitation is mild.  Mild aortic valve stenosis.  8. The inferior vena cava is normal in size with <50% respiratory  variability, suggesting right atrial pressure of 8 mmHg.  TEE 08/04/2019 1. There is a 1.1 x 0.7 cm mass attached to the P2 segment of the PMVL.  This appears to represent  a papillary fibroelastoma. There is no  significant regurgitation of the mitral valve or destruction to suggest  this is a vegetation. This is a native valve  so this is unlikely to represent thrombus. Would recommend blood cultures  to ensure there is no endocarditis. The mitral valve is abnormal. Trivial  mitral valve regurgitation. No evidence of mitral stenosis.  2. Agitated saline contrast bubble study was positive with shunting  observed within 3-6 cardiac cycles suggestive of interatrial shunt. A  moderate size PFO is present with R to L shunting.  3. Left ventricular ejection fraction, by estimation, is 70 to 75%. The  left ventricle has hyperdynamic function. The left ventricle has no  regional wall motion abnormalities. There is the interventricular septum  is flattened in systole and diastole,  consistent with right ventricular pressure and volume overload.  4. Right ventricular systolic function is moderately reduced. The right  ventricular size is severely enlarged.  5. No left atrial/left atrial appendage thrombus was detected. The LAA  emptying velocity was 58 cm/s.  6. Right atrial size was moderately dilated.  7. The tricuspid valve is abnormal. Tricuspid valve regurgitation is  moderate to severe.  8. The aortic valve is tricuspid. Aortic valve regurgitation is mild.  Mild aortic valve sclerosis is present, with no evidence of aortic valve  stenosis.  9. There is Moderate (Grade III) layered plaque involving the transverse  aorta and descending  aorta.      Laboratory Data: High Sensitivity Troponin:  No results for input(s): TROPONINIHS in the last 720 hours.   Cardiac EnzymesNo results for input(s): TROPONINI in the last 168 hours. No results for input(s): TROPIPOC in the last 168 hours.  Chemistry Recent Labs  Lab 08/11/19 0304 08/11/19 1535 08/12/19 0200  NA 140 139 140  K 4.4 4.6 4.2  CL 99 103 103  CO2 _0 GLUCOSE 98 102* 82  BUN 35*  35* 29*  CREATININE 2.01* 1.80* 1.57*  CALCIUM 8.7* 8.4* 8.8*  GFRNONAA 24* 28* 33*  GFRAA 28* 32* 38*  ANIONGAP _1 No results for input(s): PROT, ALBUMIN, AST, ALT, ALKPHOS, BILITOT in the last 168 hours. Hematology Recent Labs  Lab 08/09/19 0247 08/10/19 0337 08/11/19 0304  WBC 7.1 7.4 7.1  RBC 4.40 4.18 4.19  HGB 11.1* 10.6* 10.8*  HCT 37.3 35.6* 35.9*  MCV 84.8 85.2 85.7  MCH 25.2* 25.4* 25.8*  MCHC 29.8* 29.8* 30.1  RDW 14.6 14.6 14.6  PLT 304 351 303   BNP Recent Labs  Lab 08/08/19 1644  BNP 1,197.5*    DDimer No results for input(s): DDIMER in the last 168 hours.  Radiology/Studies:  CT ANGIO CHEST PE W OR WO CONTRAST  Result Date: 08/08/2019 CLINICAL DATA:  Dyspnea on exertion EXAM: CT ANGIOGRAPHY CHEST WITH CONTRAST TECHNIQUE: Multidetector CT imaging of the chest was performed using the standard protocol during bolus administration of intravenous contrast. Multiplanar CT image reconstructions and MIPs were obtained to evaluate the vascular anatomy. CONTRAST:  42m OMNIPAQUE IOHEXOL 350 MG/ML SOLN COMPARISON:  08/05/2019 FINDINGS: Cardiovascular: There is excellent opacification of the pulmonary arterial tree. There is no intraluminal filling defect to suggest acute pulmonary embolism. The central pulmonary arteries are markedly dilated in keeping with changes of pulmonary arterial hypertension. There is mild global cardiac enlargement with particular enlargement of the right ventricle and right atrium suggesting changes of elevated right heart pressure. There is extensive coronary artery calcification. No pericardial effusion. The thoracic aorta is age-appropriate. No aneurysm. Mediastinum/Nodes: No pathologic thoracic adenopathy. Lungs/Pleura: There is bibasilar consolidation, similar to that noted on prior examination, more prevalent within the a dependent right lower lobe. Scattered nodular infiltrate is also noted within the right upper lobe laterally. These  are new since remote prior examination of 01/02/2005. In the acute setting, this may reflect changes of atypical infection or aspiration. There is stable elevation of the right hemidiaphragm with right basilar compressive atelectasis. Upper Abdomen: Cholecystectomy has been performed. Limited images of the upper abdomen are otherwise unremarkable. Musculoskeletal: No lytic or blastic bone lesion. Review of the MIP images confirms the above findings. IMPRESSION: Stable asymmetric bibasilar consolidation, most in keeping with aspiration or infection in the acute setting. No pulmonary embolism. Morphologic changes in keeping with pulmonary arterial hypertension and elevated right heart pressure. Superimposed extensive coronary artery calcification. Aortic Atherosclerosis (ICD10-I70.0). Electronically Signed   By: AFidela SalisburyMD   On: 08/08/2019 21:28   MR BRAIN WO CONTRAST  Result Date: 08/12/2019 CLINICAL DATA:  Neuro deficit, acute, stroke suspected. Additional provided: Worsening right-sided facial droop, decreased use of right arm. EXAM: MRI HEAD WITHOUT CONTRAST TECHNIQUE: Multiplanar, multiecho pulse sequences of the brain and surrounding structures were obtained without intravenous contrast. COMPARISON:  MRI/MRA head 08/01/2019. FINDINGS: Brain: Multiple sequences are significantly motion degraded. Most notably, there is moderate motion degradation of the coronal diffusion-weighted sequence, severe motion degradation of the sagittal T1  weighted sequence, moderate/severe motion degradation of the axial T2 weighted sequence, severe motion degradation of the axial T2/FLAIR sequences, moderate motion degradation of the axial T1 weighted sequence, severe motion degradation of the coronal T2 weighted sequence. There has been significant interval progression of an acute/early subacute left MCA territory infarct as compared to the MRI of 08/01/2019. There is now a moderate to large acute/early subacute  cortical/subcortical infarct within the left temporoparietal lobes and posterior left insula. Redemonstrated small subacute cortically based infarcts within the left pre and postcentral gyri. New punctate acute/early subacute infarct within the mid left frontal lobe (series 2, image 34). A subcentimeter subacute infarct within the left cerebellar hemisphere was better appreciated on the prior MRI. There is no significant mass effect at this time. No ventricular effacement or midline shift. Stable generalized parenchymal atrophy. Tiny chronic lacunar infarcts within the cerebellar hemispheres bilaterally. No appreciable chronic intracranial blood products. No extra-axial fluid collection is identified. No midline shift. Vascular: Flow voids poorly assessed due to the degree of motion degradation. Skull and upper cervical spine: No focal marrow lesion identified within the limitations of motion degradation Sinuses/Orbits: No acute orbital abnormality identified. Mild ethmoid sinus mucosal thickening. Right mastoid effusion IMPRESSION: Significantly motion degraded and limited examination as described. Marked interval increase in extent of an acute/early subacute left MCA territory cortical/subcortical infarct. A moderate to large confluent infarct is now present within the left temporal and parietal lobes as well as posterior left insula. No significant mass effect at this time. Redemonstrated small cortically based subacute infarcts within the left pre and postcentral gyri. New punctate acute/early subacute infarct within the mid left frontal lobe white matter. A subcentimeter subacute infarct within the right cerebellum was better appreciated on the prior MRI of 08/01/2019. Stable generalized parenchymal atrophy. Small chronic infarcts within the bilateral cerebellar hemispheres. Mild ethmoid sinus mucosal thickening. Right mastoid effusion. Electronically Signed   By: Kellie Simmering DO   On: 08/12/2019 11:17   DG  CHEST PORT 1 VIEW  Result Date: 08/11/2019 CLINICAL DATA:  Shortness of breath EXAM: PORTABLE CHEST 1 VIEW COMPARISON:  August 08, 2019 chest radiograph and CT angiogram chest FINDINGS: There is airspace opacity in the lung bases, similar to recent studies with small right pleural effusion. Heart is mildly enlarged with pulmonary vascularity normal. No adenopathy. There is aortic atherosclerosis. No bone lesions. IMPRESSION: Patchy airspace opacity in the lung bases with small right pleural effusion. Stable cardiac prominence. No new opacity evident. Aortic Atherosclerosis (ICD10-I70.0). Electronically Signed   By: Lowella Grip III M.D.   On: 08/11/2019 10:54   DG Chest Port 1 View  Result Date: 08/08/2019 CLINICAL DATA:  Cough and shortness of breath EXAM: PORTABLE CHEST 1 VIEW COMPARISON:  August 05, 2019 FINDINGS: Again noted is cardiomegaly. There is hazy patchy airspace opacity seen at the right lung base with a small right pleural effusion. There is a hazy patchy airspace opacity in the left perihilar region and retrocardiac region. There is prominence of the central pulmonary vasculature. IMPRESSION: As on recent CT multifocal airspace opacities which could be due to asymmetric edema and/or infectious etiology. Small right pleural effusion. Electronically Signed   By: Prudencio Pair M.D.   On: 08/08/2019 16:58   VAS [place] LOWER EXTREMITY VENOUS (DVT) ([facility] and [facility] 7a-7p)  Result Date: 08/09/2019  Lower Venous DVTStudy Indications: Swelling, and Edema.  Limitations: Body habitus and poor ultrasound/tissue interface. Comparison Study: 08/05/19 Performing Technologist: Abram Sander RVS  Examination Guidelines: A complete  evaluation includes B-mode imaging, spectral Doppler, color Doppler, and power Doppler as needed of all accessible portions of each vessel. Bilateral testing is considered an integral part of a complete examination. Limited examinations for reoccurring indications may be performed as noted. The  reflux portion of the exam is performed with the patient in reverse Trendelenburg.  +-----+---------------+---------+-----------+----------+--------------+ RIGHTCompressibilityPhasicitySpontaneityPropertiesThrombus Aging +-----+---------------+---------+-----------+----------+--------------+ CFV  Full           Yes      Yes                                 +-----+---------------+---------+-----------+----------+--------------+   +---------+---------------+---------+-----------+----------+--------------+ LEFT     CompressibilityPhasicitySpontaneityPropertiesThrombus Aging +---------+---------------+---------+-----------+----------+--------------+ CFV      Full           Yes      Yes                                 +---------+---------------+---------+-----------+----------+--------------+ SFJ      Full                                                        +---------+---------------+---------+-----------+----------+--------------+ FV Prox  Full                                                        +---------+---------------+---------+-----------+----------+--------------+ FV Mid   Full                                                        +---------+---------------+---------+-----------+----------+--------------+ FV DistalFull                                                        +---------+---------------+---------+-----------+----------+--------------+ PFV      Full                                                        +---------+---------------+---------+-----------+----------+--------------+ POP      Full           Yes      Yes                                 +---------+---------------+---------+-----------+----------+--------------+ PTV      Full                                                        +---------+---------------+---------+-----------+----------+--------------+  PERO                                                   Not visualized +---------+---------------+---------+-----------+----------+--------------+     Summary: RIGHT: - No evidence of common femoral vein obstruction.  LEFT: - There is no evidence of deep vein thrombosis in the lower extremity.  - No cystic structure found in the popliteal fossa.  *See table(s) above for measurements and observations. Electronically signed by Ruta Hinds MD on 08/09/2019 at 10:06:03 AM.    Final     Assessment and Plan:   1. Mitral Valve Papillary Fibroelastoma -Recent stroke.  Found to have mitral valve papillary fibroelastoma up to 1.1 cm on TEE during last admission.  This is most certainly the source of her stroke. -She was also found to have a DVT and a PFO.  She only has a PFO because her right-sided pressures are so high.  She will be anticoagulated for DVT -The bigger issue is what to do about this MV mass.  I do not think this represents thrombus.  This is a native mitral valve.  I do not think this represents endocarditis.  She did have a positive blood culture but repeat cultures are negative.  She has no persistent fever or symptoms concerning for endocarditis.  There also is no destruction of the mitral valve to suggest this is infection related. 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  -Given her morbid obesity with BMI 51, chronic hypoxemic respiratory failure, severe pulmonary hypertension with cor pulmonale, I do not feel she will be a surgical candidate. The only way to reduce her risk of further stroke, is surgical intervention for definitive treatment. She will need to be evaluated by cardiac surgery nonetheless.  Aspirin and anticoagulation do not help papillary fibroblastoma's.  She may be in a very difficult spot.  2.  Severe pulmonary hypertension/cor pulmonale/right heart failure -Her echo was rather impressive with near systemic pulmonary pressures.  Likely a bit overestimated.  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 -She had elevated BNP.  I have  added Lasix IV 40 mg daily.  Would continue with diuresis. -She does need evaluation for this.  She has not had PFTs.  She does have rheumatoid arthritis which could predispose her to primary pulmonary hypertension.  Given her obesity and OSA I suspect this is just group 3 pulmonary hypertension.  She can consider further work-up of this as an outpatient. I do not feel this is related to HFpEF. She has no LA enlargement to suggest elevated left atrial pressure/pulonary venous hypertension.  -would consider pulmonary evaluation while inpatient as she has had trouble following up with things as an outpatient.   For questions or updates, please contact [place] Please consult [email or web address] for contact info under   Signed, [place] B[place] T. Audie Box, [place]  08/12/2019 4:18 PM

## 2019-08-12 NOTE — Progress Notes (Signed)
ANTICOAGULATION CONSULT NOTE - Initial Consult  Pharmacy Consult for heparin Indication: stroke, DVT 08/05/19  No Known Allergies  Patient Measurements: Height: 5' 3" (160 cm) Weight: 130.6 kg (287 lb 14.7 oz) IBW/kg (Calculated) : 52.4 Heparin Dosing Weight: 85kg  Vital Signs: Temp: 98.1 F (36.7 C) (07/16 0454) Temp Source: Oral (07/16 0454) BP: 156/83 (07/16 1158) Pulse Rate: 79 (07/16 1158)  Labs: Recent Labs    08/10/19 0337 08/10/19 0337 08/11/19 0304 08/11/19 1535 08/12/19 0200  HGB 10.6*  --  10.8*  --   --   HCT 35.6*  --  35.9*  --   --   PLT 351  --  303  --   --   CREATININE 1.52*   < > 2.01* 1.80* 1.57*   < > = values in this interval not displayed.    Estimated Creatinine Clearance: 42.8 mL/min (A) (by C-G formula based on SCr of 1.57 mg/dL (H)).   Medical History: Assessment: 74 year old W starting on heparin for acute CVA (enlargement of infarct at L MCA territory and new infarct in L frontal lobe) while on apixaban. 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 Plan is to hold apixaban, start heparin and resume apixaban if CT head is stable in 3-5 days. Last dose of apixaban given 7/16 at 9:30am.   Will start heparin 12 hours from last apixaban dose. H/H anemic and stable, plt stable.  Goal of Therapy:  Heparin level 0.3- 0.5 units/ml Monitor platelets by anticoagulation protocol: Yes   Plan:  Start heparin drip at 1000 units/hr, no bolus, at 2100 pm F/u aPTT until correlates with heparin level   F/u 8hr aPTT and HL (at 0500)  Monitor daily aPTT, HL, CBC/plt Monitor for signs/symptoms of bleeding    Benetta Spar, PharmD, BCPS, BCCP Clinical Pharmacist  Please check AMION for all [place] phone numbers After 10:00 PM, call [place]

## 2019-08-12 NOTE — Progress Notes (Signed)
Reviewed MRI brain findings showing interval increase of know infarct and additional acute/early subacute in mid left frontal lobe.   Officially consulted and spoke with neurology, Dr. Rory Percy. Recommended holding eliquis moving forward and no ASA/plavix at this time. RN updated on plan of care and will call to update family.  [name], DO

## 2019-08-12 NOTE — Progress Notes (Signed)
Reached patient's daughter, [name], and updated on current clinical status and discussed new CVA.  All of her questions were answered and she appreciated the call.  She will be trying to visit later today if possible.  Will plan on updating daughter every day as patient is unable to do so, she was instructed to call the floor if she had not heard from our team by the end of the day for any updates.  [name], DO

## 2019-08-12 NOTE — Progress Notes (Addendum)
Interim progress note:  Received call that patient had a worsening right-sided facial droop and was not using her right arm as well.  She additionally seemed confused.  Of note, she had a recent acute stroke on 7/5 within the left pre-/post central gyri and within the right cerebellum that manifested with the similar symptoms as above.  She currently is being treated for a community-acquired pneumonia, otherwise had had significant improvement in her right side prior to this.  On Eliquis.  Evaluated patient at bedside.  Last known normal was last night.   Blood pressure (!) 142/104, pulse 87, temperature 98.1 F (36.7 C), temperature source Oral, resp. rate 18, height _0  (1.6 m), weight 130.6 kg, SpO2 93 %. General: Alert Neuro: Alert, knows her name and that she is in the hospital but unsure why.  EOMI.  Mild right-sided facial droop, drool hanging out of the right side of her mouth.  Repetitive speech with approximately 50% difficult to understand and slurred.  Able to move all extremity spontaneously, however using right arm less.   Called Dr. Malen Gauze, neurology, ASAP who reviewed her previous imaging in recent stroke work-up.  Recommended obtaining stat MRI brain and ruling out precipitating infectious etiology (U/A, CXR).  Keep Eliquis as is for now.  Discussed to call after MRI results return.  Additionally updated, Dr. Ardelia Mems, attending physician on service.  [name], DO

## 2019-08-12 NOTE — Progress Notes (Signed)
Family Medicine Teaching Service Daily Progress Note Intern Pager: [phone]  Patient name: [name] Medical record number: [id number] Date of birth: 11-05-1945 Age: 74 y.o. Gender: female  Primary Care Provider: Dixie Dials, MD Consultants: None Code Status: FULL  Pt Overview and Major Events to Date:  Admitted 7/12  Assessment and Plan: Simara Rhyner Hooksis a 73 y.o.femalepresenting after a positive result on Blood culture post discharge to a SNF.PMH is significant for CVA,hyperlipidemia, hypertension, anemia, and rheumatoid arthritis.  Acute R-sided weakness, slurred speech with Hx recent CVA Patient recently with admission for CVA 7/5. Today with similar presentation of right-sided hand and facial droop. Speech incompressible. Drool down right-side of face. Concerning for increased brain involvement from recent stroke vs. Delirium. Last known normal was yesterday evening. - Neurology on board, appreciate recommendations - STAT MRI brain  - Neuro checks q2h x12 hours, then q4h  - Vital signs q2h x 12h then q4h - UA  - CBC, CMP, Mg, Phos daily  - NPO til bedside swallow study  - Has had recent Echo and carotid doppler  Hypoxia likely secondary toCHF exacerbation- HFpEF.BNP is 1,197 at admission, last BNP was 550 in 2015.Electrolytes WNL.Creatinine 1.12 >1.52 > 2.01 > 1.8 > 1.57.  - Monitor O2 requirements - Strict I/Os; I/O yesterday -1,160 - Daily weights  AKI- improving Creatinine 1.12 > 1.52 > 2.01 > 1.8 > 1.57. Received 40 IV Lasix x2 this admission. Could also be contrast-induced.  I/O -1,160 today.  - Stop diuresis - improved after 500 cc NS bolus yesterday  - morning BMP  Productive Cough  Patient reports continued cough since last admission. Yellow productive sputum, now clear.Believes that her cough is improving.Afebrile, WBC within normal limits. Imaging notable formultifocal airspace opacities which could be due to asymmetric edema and/or infectious  etiology. - Will treat forHAP considering patient states cough began last admission - Doxycycline 100 mg q12 hoursthrough 7/17  Hx Positive Blood Culture,likely contaminant Patient was discharged to SNF with cultures pending; however they came back positive for corynebacterium. - Repeat culture has shown no growth 3 days.  History of CVA-Patient was recently discharged from the service after CVA. 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 Patient received repeatCT angiography of chest with contrast with no evidence of PE. -On Eliquis,Crestor -PT/OTat SNF  -Follow-up neurology outpatient  L leg erythema- improved Patient was concerned about the increased swelling her legs. She also had slight erythema of the lower left shin and increased pain to palpation of the area. The patient was found to have a Right peroneal DVT, of indeterminate age during most recent hospitalization and was subsequently placed on Eliquis. Patient has been taking her Eliquis as prescribed. She was reporting SOB, but no chest pain. CT Angio at admission was negative for PE.  - Continue Eliquis  HTN- chronic Patient takes metoprolol 12.5 BID. BP range 129/55-153/68. - Hold medication while NPO - Monitor BP  CKD Stage II- stable Creatinine improved to 1.57 today. - AM BMP  Rheumatoid arthritis- stable, chronic Patient takes prednisone, leflunomide, and hydroxychloroquine.  - Continue home meds - Start Protonix  Hyperlipidemia- stable and chronic Patient is post- CVA and has a hx HLD. Patient takes rosuvastatin at home. - Continue home meds  Chronic pain syndrome Takes norco PRN at home, PMP aware reviewed and appropriate. - Continue home regimen  Hypothyroidism- Chronic and stable Patient takes levothyroxine. - Continue  home medication  FEN/GI:NPO til swallow eval   Prophylaxis:Eliquis  Disposition: Monitor inpatient   Subjective:  History limited due to patient poor historian. Repeating "I just want to know, why is the TV" "at the TV" "I wanna go home"  Objective: Temp:  [97.7 F (36.5 C)-98.7 F (37.1 C)] 98.1 F (36.7 C) (07/16 0454) Pulse Rate:  [66-75] 66 (07/16 0454) Resp:  [12-20] 18 (07/16 0454) BP: (129-153)/(54-68) 141/54 (07/16 0454) SpO2:  [95 %-100 %] 97 % (07/16 0454) Physical Exam: General: awake, oriented x1 Cardiovascular: RRR, no murmurs Respiratory: CTAB Neuro: right-sided facial droop, unable to smile, neglecting right side. Drool down right-side of mouth. Aphasia, speech is slurred, semi-comprehensible but does not make sense. Unable to follow movements with eyes. 0/5 R UE weakness, 3/5 L UE strength.  Extremities: slightly moves lower extremities, moving left arm much more spontaneously, R upper extremity weak and difficult to move  Laboratory: Recent Labs  Lab 08/09/19 0247 08/10/19 0337 08/11/19 0304  WBC 7.1 7.4 7.1  HGB 11.1* 10.6* 10.8*  HCT 37.3 35.6* 35.9*  PLT 304 351 303   Recent Labs  Lab 08/11/19 0304 08/11/19 1535 08/12/19 0200  NA 140 139 140  K 4.4 4.6 4.2  CL 99 103 103  CO2 _0 BUN 35* 35* 29*  CREATININE 2.01* 1.80* 1.57*  CALCIUM 8.7* 8.4* 8.8*  GLUCOSE 98 102* 82    Imaging/Diagnostic Tests: CT Angtio Chest PE with Contrast 7/14 IMPRESSION: Stable asymmetric bibasilar consolidation, most in keeping with aspiration or infection in the acute setting. No pulmonary embolism. Morphologic changes in keeping with pulmonary arterial hypertension and elevated right heart pressure. Superimposed extensive coronary artery calcification. Aortic Atherosclerosis (ICD10-I70.0).  Chest 1 View 7/12 IMPRESSION: As on recent CT multifocal airspace opacities which could be due to asymmetric edema and/or infectious etiology. Small right pleural effusion.  [name],  DO 08/12/2019, 6:46 AM PGY-1, [facility] Intern pager: [phone], text pages welcome

## 2019-08-12 NOTE — Consult Note (Signed)
Neurology Consultation  Reason for Consult: Stroke Referring Physician: Leeanne Rio, MD   CC: Worsening right-sided facial droop and weakness in right arm.  History is obtained from: Chart and patient  HPI: [name] is a 74 y.o. female with history of hypertension, hyperlipidemia and recent stroke.  Patient was initially brought to the hospital secondary to hypoxia.  She was recently seen on 08/01/2019 for stroke.  Symptoms were right arm weakness and decreased sensation along the hip.  At that time exam showed that she could move her right arm antigravity but had a significant drift down to bed.  Distal is weaker than proximal.  Right leg showed antigravity with no drift.  Patient also had decreased sensation in her right arm along with pain to touch.  MRI findings showed small acute cortical/subcortical infarct affecting the left pre and post central gyri.  MRA showed gross patency, with right vertebral artery and right PICA not well seen.  Carotid Doppler showed bilateral ICA 1-39% stenosis.  At that time her LDL was 76, A1c 5.7.  Patient did have a moderate sized PFO.  Stroke was thought to be embolic secondary to unknown source, unclear if it was related to a fibroblastoma.  Patient was placed on Aspirin and Plavix for 3 weeks followed by aspirin alone.  Neurology was consulted today secondary to increased right-sided facial droop and right-sided weakness.    Currently on consultation patient is aphasic both receptive and expressive, has extreme difficulty following commands.  No history is able to be obtained.  LKW: Unknown tpa given?: no, recent stroke Premorbid modified Rankin scale (mRS): 4 NIH stroke score: 14 (2 for not answering questions right, 2 for inability to follow commands such as opening and closing eyes along with hand, 2 for right hemianopsia, 1 for right arm drift, 2 for limb ataxia, 2 for aphasia, 2 for dysarthria, 1 for facial.)   Past Medical History:   Diagnosis Date  . Anemia    takes Ferrous Sulfate daily  . Arthritis   . Chronic bronchitis ([place])    Combivent if needed)  . Colostomy care [facility]) 2004  . Coronary artery disease   . Diverticular disease   . Dizziness   . Dry eyes    uses Restasis daily  . H/O hiatal hernia   . History of blood transfusion    no abnormal reaction noted  . History of colon polyps    benign  . Hyperlipidemia    takes Zocor daily  . Hypertension    takes Metoprolol daily  . Hypothyroidism    takes Synthroid daily  . Nocturia    states only d/t being on Lasix  . Numbness and tingling    left foot  . Peripheral edema    takes Lasix daily  . Rheumatoid arthritis(714.0)    Methotrexate 2 times a week  . Shortness of breath    with exertion    Family History  Problem Relation Age of Onset  . Anesthesia problems Neg Hx   . Hypotension Neg Hx   . Malignant hyperthermia Neg Hx   . Pseudochol deficiency Neg Hx    Social History:   reports that she quit smoking about 16 years ago. She has never used smokeless tobacco. She reports that she does not drink alcohol and does not use drugs.  Medications  Current Facility-Administered Medications:  .  acetaminophen (TYLENOL) tablet 1,000 mg, 1,000 mg, Oral, Q6H PRN, 1,000 mg at 08/10/19 1645 **OR** acetaminophen (TYLENOL)  suppository 650 mg, 650 mg, Rectal, Q6H PRN, Anderson, Chelsey L, DO .  cycloSPORINE (RESTASIS) 0.05 % ophthalmic emulsion 1 drop, 1 drop, Both Eyes, TID PRN, Ouida Sills, Chelsey L, DO .  doxycycline (VIBRA-TABS) tablet 100 mg, 100 mg, Oral, Q12H, Beard, Samantha N, DO, 100 mg at 08/12/19 0929 .  HYDROcodone-acetaminophen (NORCO) 10-325 MG per tablet 1 tablet, 1 tablet, Oral, TID PRN, Ouida Sills, Chelsey L, DO, 1 tablet at 08/10/19 1001 .  hydroxychloroquine (PLAQUENIL) tablet 200 mg, 200 mg, Oral, BID, Anderson, Chelsey L, DO, 200 mg at 08/12/19 0929 .  leflunomide (ARAVA) tablet 10 mg, 10 mg, Oral, Daily, Anderson, Chelsey L, DO, 10  mg at 08/12/19 0928 .  levothyroxine (SYNTHROID) tablet 50 mcg, 50 mcg, Oral, Q0600, Anderson, Chelsey L, DO, 50 mcg at 08/12/19 0456 .  metoprolol tartrate (LOPRESSOR) tablet 12.5 mg, 12.5 mg, Oral, BID, Anderson, Chelsey L, DO, 12.5 mg at 08/12/19 0930 .  polyethylene glycol (MIRALAX / GLYCOLAX) packet 17 g, 17 g, Oral, Daily PRN, Ouida Sills, Chelsey L, DO .  predniSONE (DELTASONE) tablet 5 mg, 5 mg, Oral, Daily, Anderson, Chelsey L, DO, 5 mg at 08/12/19 0930 .  rosuvastatin (CRESTOR) tablet 20 mg, 20 mg, Oral, q1800, Anderson, Chelsey L, DO, 20 mg at 08/11/19 1740  ROS:  Unable to obtain due to severe aphasia.   Exam: Current vital signs: BP (!) 156/83 (BP Location: Right Leg)   Pulse 79   Temp 98.1 F (36.7 C) (Oral)   Resp 18   Ht _0  (1.6 m)   Wt 130.6 kg   SpO2 91%   BMI 51.00 kg/m  Vital signs in last 24 hours: Temp:  [97.7 F (36.5 C)-98.7 F (37.1 C)] 98.1 F (36.7 C) (07/16 0454) Pulse Rate:  [66-87] 79 (07/16 1158) Resp:  [12-20] 18 (07/16 1158) BP: (137-156)/(54-104) 156/83 (07/16 1158) SpO2:  [91 %-100 %] 91 % (07/16 1158)   Constitutional: Appears well-developed and well-nourished.  Eyes: No scleral injection HENT: No OP obstrucion Head: Normocephalic.  Cardiovascular: Normal rate and regular rhythm.  Respiratory: Effort normal, non-labored breathing GI: Soft.  No distension. There is no tenderness.  Skin: WDI  Neuro: Mental Status: Patient is awake, aphasic both receptive and expressive.  She is dysarthric.  At times can follow visual commands but the majority time due to her aphasia is unable to follow commands.   Cranial Nerves: II: Right hemianopsia III,IV, VI: EOMI without ptosis or diploplia. Pupils equal, round and reactive to light V: Facial sensation is symmetric to temperature VII: Mild lower right facial weakness VIII: hearing is intact to voice. XII: tongue is midline without atrophy or fasciculations.  Motor: Moves all 4 extremities  antigravity.  She does have a mild drift on the right arm Sensory: Sensation intact throughout and intact to DSS Deep Tendon Reflexes: 1+ bilateral upper extremities, no knee jerk or ankle jerk Plantars: Toes are downgoing bilaterally.  Cerebellar: Ataxic with finger-to-nose unable to do heel-to-shin  Labs I have reviewed labs in epic and the results pertinent to this consultation are:  CBC    Component Value Date/Time   WBC 7.1 08/11/2019 0304   RBC 4.19 08/11/2019 0304   HGB 10.8 (L) 08/11/2019 0304   HCT 35.9 (L) 08/11/2019 0304   HCT 22.8 (L) 06/19/2017 0726   PLT 303 08/11/2019 0304   MCV 85.7 08/11/2019 0304   MCH 25.8 (L) 08/11/2019 0304   MCHC 30.1 08/11/2019 0304   RDW 14.6 08/11/2019 0304   LYMPHSABS 2.0 08/08/2019  1625   MONOABS 0.7 08/08/2019 1625   EOSABS 0.1 08/08/2019 1625   BASOSABS 0.1 08/08/2019 1625    CMP     Component Value Date/Time   NA 140 08/12/2019 0200   NA 146 10/31/2015 0000   K 4.2 08/12/2019 0200   CL 103 08/12/2019 0200   CO2 29 08/12/2019 0200   GLUCOSE 82 08/12/2019 0200   BUN 29 (H) 08/12/2019 0200   BUN 23 (A) 10/31/2015 0000   CREATININE 1.57 (H) 08/12/2019 0200   CALCIUM 8.8 (L) 08/12/2019 0200   PROT 6.9 08/01/2019 1349   ALBUMIN 2.8 (L) 08/01/2019 1349   AST 29 08/01/2019 1349   ALT 13 08/01/2019 1349   ALKPHOS 47 08/01/2019 1349   BILITOT 1.0 08/01/2019 1349   GFRNONAA 33 (L) 08/12/2019 0200   GFRAA 38 (L) 08/12/2019 0200   Imaging I have reviewed the images obtained: MRI examination of the brain-Marked interval increase in extent of an acute/early subacute left MCA territory cortical/subcortical infarct. A moderate to large confluent infarct is now present within the left temporal and parietal lobes as well as posterior left insula. No significant mass effect at this time.  Redemonstrated small cortically based subacute infarcts within the left pre and postcentral gyri.  New punctate acute/early subacute infarct  within the mid left frontal lobe white matter.  A subcentimeter subacute infarct within the right cerebellum was better appreciated on the prior MRI of 08/01/2019.  MRA head 7/5 IMPRESSION: 1. Technically limited exam due to extensive motion artifact. 2. Hypoplastic right vertebral artery, nonvisualized as it courses into the cranial vault, and could be occluded. Flow related signal seen within the right V4 segment distally, which could be collateral nature. Right PICA not definitely seen. Dominant left vertebral artery widely patent. 3. Otherwise gross patency of the major arterial intracranial arterial circulation. No other large vessel occlusion. No definite hemodynamically significant or correctable stenosis.  Transesophageal echocardiogram from last admission IMPRESSIONS  1. There is a 1.1 x 0.7 cm mass attached to the P2 segment of the PMVL.  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 There is no  significant regurgitation of the mitral valve or destruction to suggest  this is a vegetation. This is a native valve  so this is unlikely to represent thrombus. Would recommend blood cultures  to ensure there is no endocarditis. The mitral valve is abnormal. Trivial  mitral valve regurgitation. No evidence of mitral stenosis.  2. Agitated saline contrast bubble study was positive with shunting  observed within 3-6 cardiac cycles suggestive of interatrial shunt. A  moderate size PFO is present with R to L shunting.  3. Left ventricular ejection fraction, by estimation, is 70 to 75%. The  left ventricle has hyperdynamic function. The left ventricle has no  regional wall motion abnormalities. There is the interventricular septum  is flattened in systole and diastole,  consistent with right ventricular pressure and volume overload.  4. Right ventricular systolic function is moderately reduced. The right  ventricular size is severely enlarged.  5. No left atrial/left  atrial appendage thrombus was detected. The LAA  emptying velocity was 58 cm/s.  6. Right atrial size was moderately dilated.  7. The tricuspid valve is abnormal. Tricuspid valve regurgitation is  moderate to severe.  8. The aortic valve is tricuspid. Aortic valve regurgitation is mild.  Mild aortic valve sclerosis is present, with no evidence of aortic valve  stenosis.  9. There is Moderate (Grade III) layered plaque involving the transverse  aorta and descending aorta.   Etta Quill PA-C [facility] [phone]  M-F  (9:00 am- 5:00 PM)  08/12/2019, 12:44 PM   Attending Neurohospitalist Addendum Patient seen and examined with APP/Resident. Agree with the history and physical as documented above. I have independently reviewed the chart, obtained history, review of systems and examined the patient.I have personally reviewed pertinent head/neck/spine imaging (CT/MRI).  Briefly, this is a 74 year old woman who has a past medical history of hypertension, hyperlipidemia and a recent stroke-embolic stroke of unknown source where she had acute ischemic infarcts in the left frontal cortical areas and right cerebellum, got a complete work-up done-was noted to have a small PFO, also noted to have on the echocardiogram-TEE-a mass in the posterior mitral valve leaflet-?fibroelastoma not deemed to be vegetation or harboring a thrombus.  Also found to have a DVT.  Started on Eliquis and discharged and came back to the hospital for worsening symptoms of right arm and leg weakness as well as speech problems.  She is also noted to have right-sided facial droop. On my examination: She has difficulty following commands as well as producing words-has both receptive and expressive aphasia.  She is moderately dysarthric.  She can mimic at times but that is inconsistent.  She also has right homonymous hemianopsia and a right lower facial weakness. NIH stroke scale 14 MRI examination shows a  moderately large area of restricted diffusion-acute to subacute looking in the left MCA territory-which is posterior to the prior infarct and includes probably some extension of the prior infarct involving the left temporal and parietal lobes as well as posterior left insula.  Redemonstrated small cortically based subacute infarcts within the left brain postcentral gyri.  There is also a new punctate infarct within the mid left frontal white matter.  The right cerebellar infarct is less prominent.  Multiple chronic infarcts seen.  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 MRI shows a significantly larger area of acute stroke that was visualized a week ago on the prior MRI-some extension of the prior stroke but possibly new areas of infarct in the left cerebral hemisphere.  There are additional areas of new strokes in the left hemisphere raising the possibility of new or ongoing embolic activity. The patient was started on Eliquis during the last admission for the DVT and presence of a possible small PFO.  She also has a mass on the posterior mitral valve leaflet on the echocardiogram, which in the opinion of cardiology was likely not thrombogenic. At this time, given the increased size of the stroke and the large area of an acute infarct, anticoagulation should be held for a few days.  She does need anticoagulation for DVT.  I will recommend holding the Eliquis and starting heparin stroke protocol  Not a candidate for TPA-outside the window, on Eliquis, recent stroke Not a candidate for endovascular thrombectomy-recent stroke  Impression: -New acute ischemic stroke -Recent acute ischemic  stroke -DVT -PFO -Posterior mitral valve leaflet mass  Recommendations: -I would recommend stopping Eliquis for now. -I would recommend starting heparin drip-stroke protocol-no bolus. -Obtain head CT in 3 to 5 days -Resume Eliquis in 3 to 5 days if no bleed seen on that CT. -Might repeat at TTE  -  PT OT speech therapy-n.p.o. until cleared by bedside swallow evaluation of formal speech therapy evaluation -She is anticoagulated and I am not really sure if a IVC filter would be of any additional benefit but given that she has increased the stroke size or has a new stroke-consideration should be given to the mitral valve leaflet mass being thrombogenic as well.  Anticoagulation will provide good protection for embolic events from the DVT but probably not as much from a thrombogenic mass. -I will repeat an MRI of the head to look for any evidence of occluded intracranial vessels although I do not think that that is going to change the management at this time as she is outside the window for all interventions.  Stroke team will follow with you.  Discussed case in detail with Dr. Higinio Plan of the primary medicine team.  -- Amie Portland, MD [facility] Pager: [phone] If 7pm to 7am, please call on call as listed on AMION.

## 2019-08-12 NOTE — Progress Notes (Signed)
Attempted to reach daughter, [name], at number provided in chart. No answer and continuously rang without voicemail set up.   Called granddaughter, [name], also listed in chart. Updated granddaughter pertaining new stroke and current clinical status. All questions answered. She additionally provided a different phone number for her mother: [phone]. Call this contact, no answer and left voicemail. Will try again shortly.   [name], DO

## 2019-08-12 NOTE — TOC Progression Note (Signed)
Transition of Care [facility]) - Progression Note    Patient Details  Name: [name] MRN: [id number] Date of Birth: [birth year]  Transition of Care [facility]) CM/SW [place], [place] Phone Number: 08/12/2019, 3:11 PM  Clinical Narrative:     Josem Kaufmann number is [phone] and will be active until 08/16/19. [facility] has been notified that pt d/c is being pushed back due to medical.   Expected Discharge Plan: [place] Barriers to Discharge: Continued Medical Work up  Expected Discharge Plan and Services Expected Discharge Plan: [place]       Living arrangements for the past 2 months: Apartment Expected Discharge Date: 08/10/19                                     Social Determinants of Health (SDOH) Interventions    Readmission Risk Interventions No flowsheet data found.  Emeterio Reeve, Latanya Presser, [place] Social Worker [phone]

## 2019-08-12 NOTE — Progress Notes (Signed)
PT Cancellation Note  Patient Details Name: BROOKELYNNE DIMPERIO MRN: [id number] DOB: 05/25/1945   Cancelled Treatment:    Reason Eval/Treat Not Completed: Medical issues which prohibited therapy- new neurologic s/s, pt out of room for imaging. Will check back as medically appropriate.  [place] Pager [phone]  Office [phone]    Roxine Caddy D Elonda Husky 08/12/2019, 10:21 AM

## 2019-08-13 ENCOUNTER — Encounter (HOSPITAL_COMMUNITY): Payer: Self-pay | Admitting: Family Medicine

## 2019-08-13 DIAGNOSIS — R222 Localized swelling, mass and lump, trunk: Secondary | ICD-10-CM

## 2019-08-13 LAB — CULTURE, BLOOD (ROUTINE X 2)
Culture: NO GROWTH
Culture: NO GROWTH
Special Requests: ADEQUATE
Special Requests: ADEQUATE

## 2019-08-13 LAB — COMPREHENSIVE METABOLIC PANEL
ALT: 17 U/L (ref 0–44)
AST: 37 U/L (ref 15–41)
Albumin: 2.8 g/dL — ABNORMAL LOW (ref 3.5–5.0)
Alkaline Phosphatase: 44 U/L (ref 38–126)
Anion gap: 13 (ref 5–15)
BUN: 25 mg/dL — ABNORMAL HIGH (ref 8–23)
CO2: 25 mmol/L (ref 22–32)
Calcium: 9.4 mg/dL (ref 8.9–10.3)
Chloride: 99 mmol/L (ref 98–111)
Creatinine, Ser: 1.42 mg/dL — ABNORMAL HIGH (ref 0.44–1.00)
GFR calc Af Amer: 43 mL/min — ABNORMAL LOW (ref >60)
GFR calc non Af Amer: 37 mL/min — ABNORMAL LOW (ref >60)
Glucose, Bld: 84 mg/dL (ref 70–99)
Potassium: 5.1 mmol/L (ref 3.5–5.1)
Sodium: 137 mmol/L (ref 135–145)
Total Bilirubin: 1.3 mg/dL — ABNORMAL HIGH (ref 0.3–1.2)
Total Protein: 7.3 g/dL (ref 6.5–8.1)

## 2019-08-13 LAB — CBC
HCT: 40.8 % (ref 36.0–46.0)
Hemoglobin: 12.5 g/dL (ref 12.0–15.0)
MCH: 25.9 pg — ABNORMAL LOW (ref 26.0–34.0)
MCHC: 30.6 g/dL (ref 30.0–36.0)
MCV: 84.5 fL (ref 80.0–100.0)
Platelets: UNDETERMINED 10*3/uL (ref 150–400)
RBC: 4.83 MIL/uL (ref 3.87–5.11)
RDW: 14.4 % (ref 11.5–15.5)
WBC: 5.2 10*3/uL (ref 4.0–10.5)
nRBC: 0 % (ref 0.0–0.2)

## 2019-08-13 LAB — APTT
aPTT: 32 seconds (ref 24–36)
aPTT: 43 seconds — ABNORMAL HIGH (ref 24–36)

## 2019-08-13 LAB — HEPARIN LEVEL (UNFRACTIONATED): Heparin Unfractionated: 1.5 IU/mL — ABNORMAL HIGH (ref 0.30–0.70)

## 2019-08-13 IMAGING — MR MR MRA HEAD W/O CM
1 series · 19 of 48 positions shown · non-contrast
Comparison: Prior study from [DATE].

CLINICAL DATA: Initial evaluation for acute stroke.

EXAM:
MRA HEAD WITHOUT CONTRAST
TECHNIQUE: Angiographic images of the Circle of Willis were obtained using MRA
technique without intravenous contrast.

[Series 5: 3d cow · axial · 0.5mm · 0.41mm/px · z∈[-96,-15]mm · 19 of 172 slices shown]
[im 1/172]
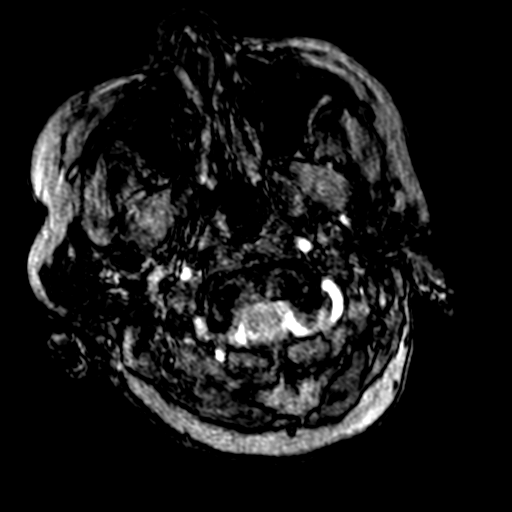
[im 4/172]
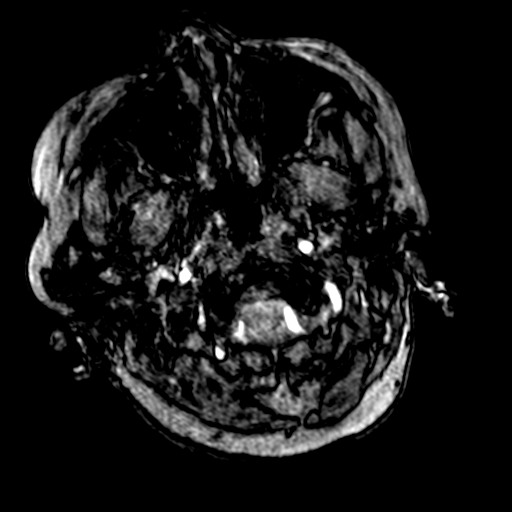
[im 8/172]
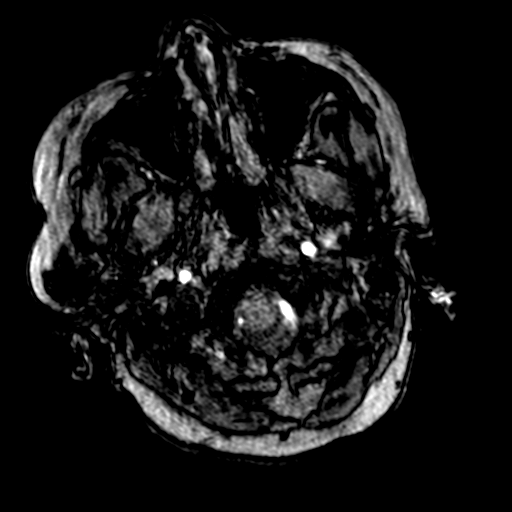
[im 11/172]
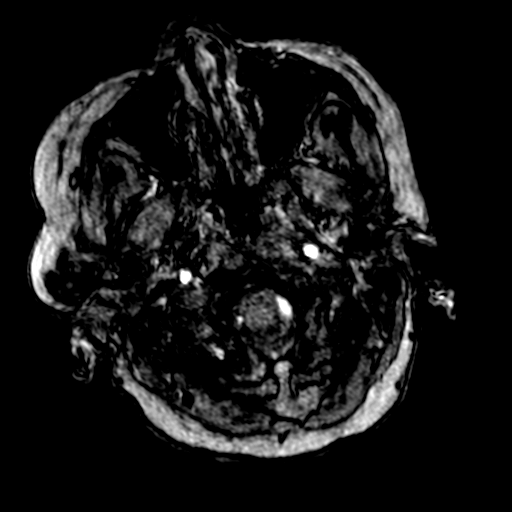
[im 15/172]
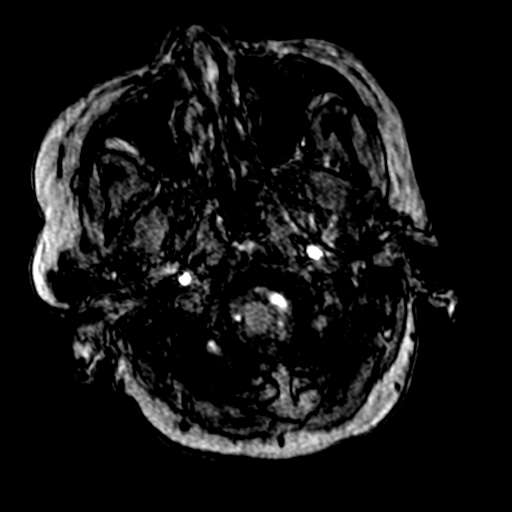
[im 19/172]
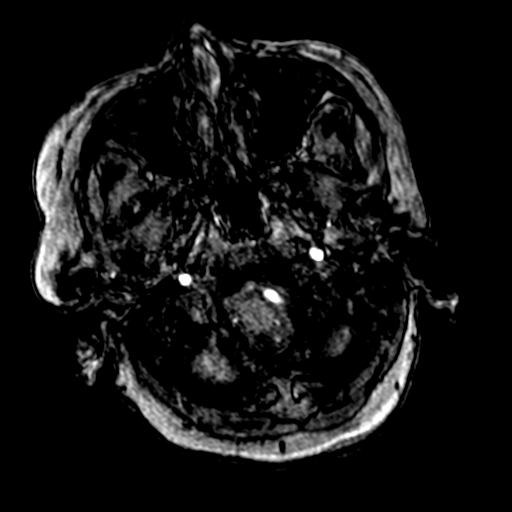
[im 22/172]
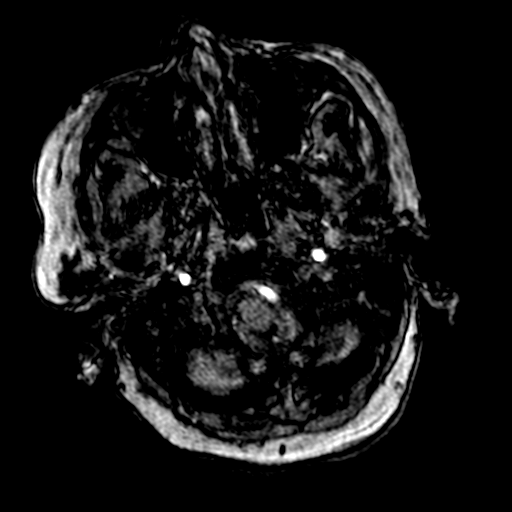
[im 26/172]
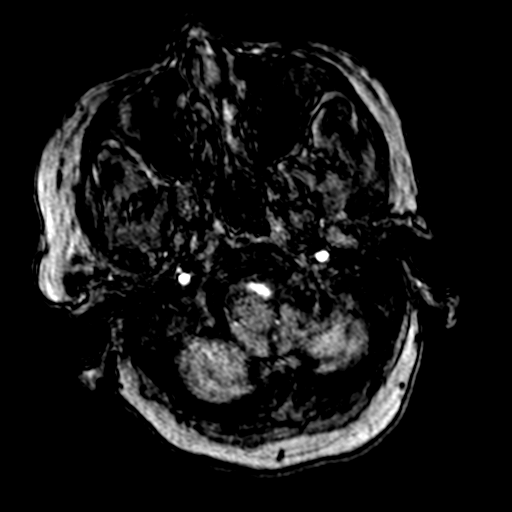
[im 30/172]
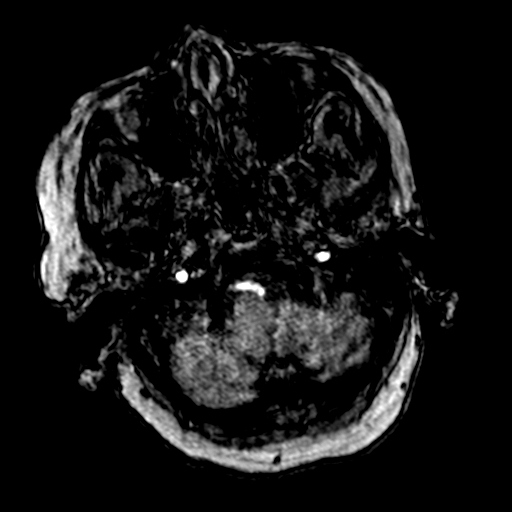
[im 33/172]
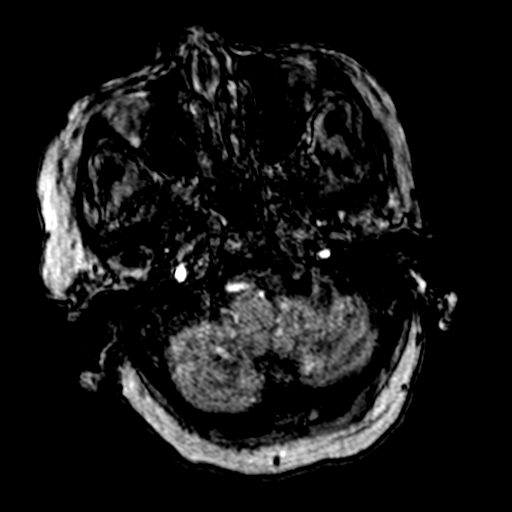
[im 37/172]
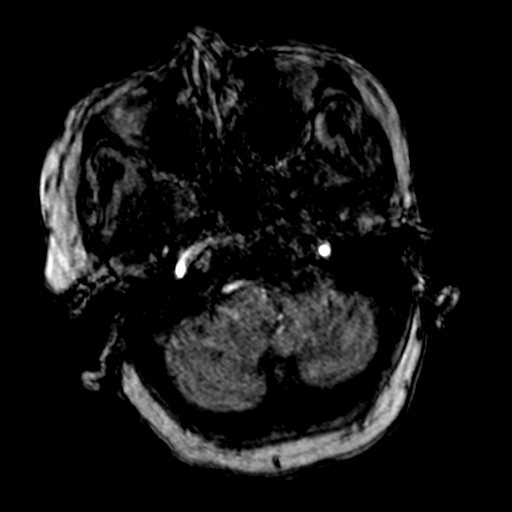
[im 55/172]
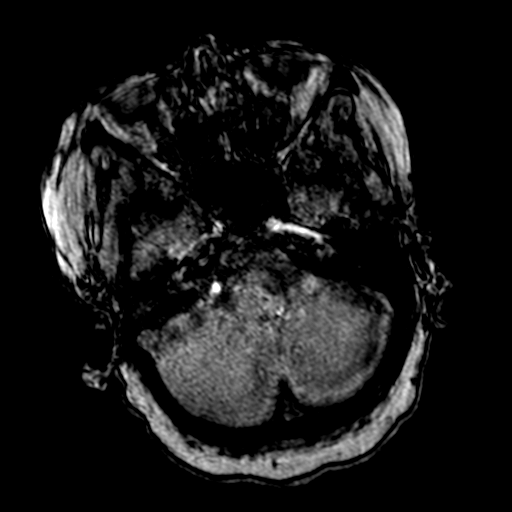
[im 77/172]
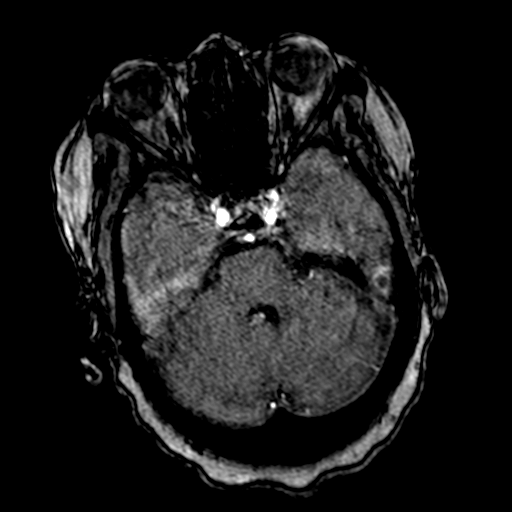
[im 88/172]
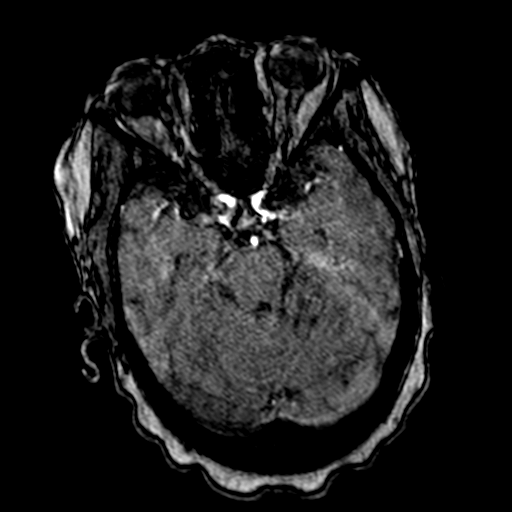
[im 99/172]
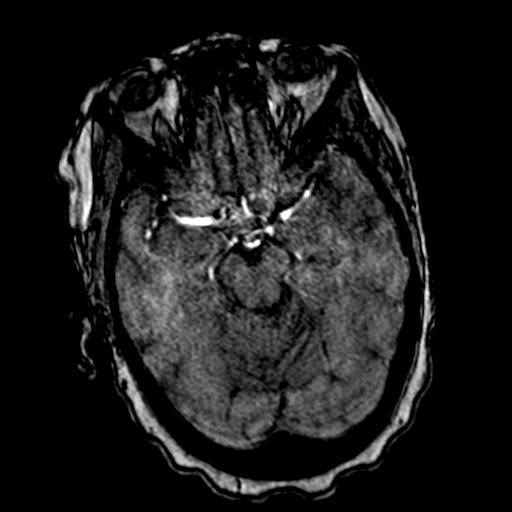
[im 121/172]
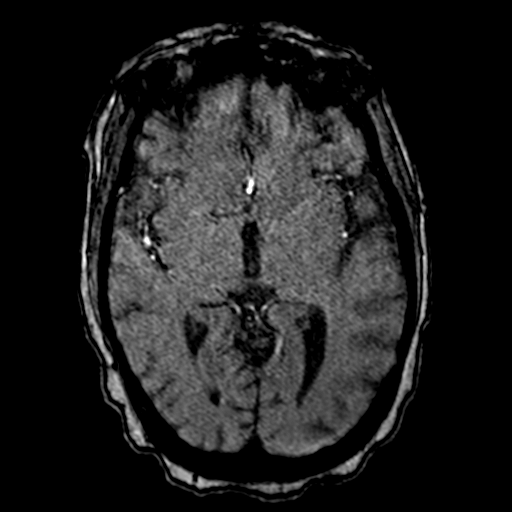
[im 142/172]
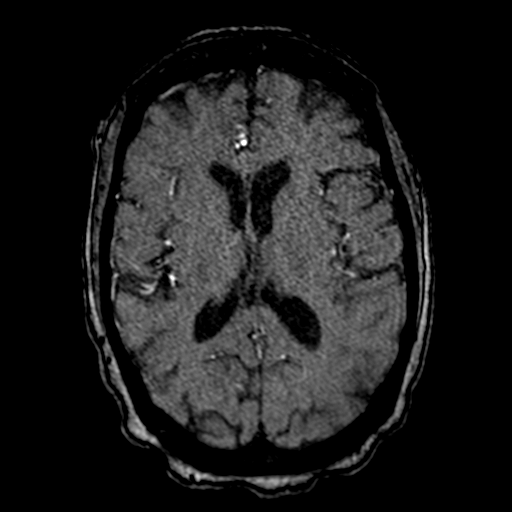
[im 146/172]
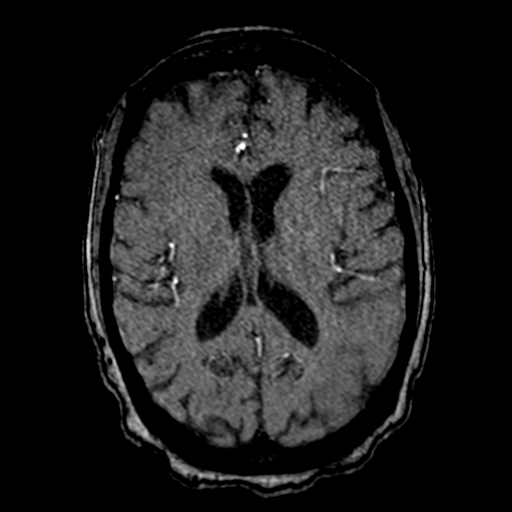
[im 164/172]
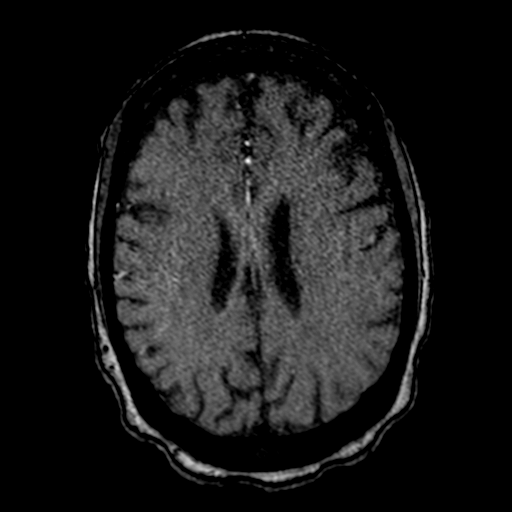

[19 of 48 positions shown; findings below may reference images not displayed]

FINDINGS: ANTERIOR CIRCULATION:

Examination severely degraded by motion artifact.

Visualized distal cervical segments of the internal carotid arteries
are grossly patent with symmetric antegrade flow. Petrous,
cavernous, and supraclinoid ICAs patent without appreciable stenosis
or other abnormality. A1 segments grossly patent. Grossly normal
anterior communicating artery complex. Anterior cerebral arteries
well perfused to their distal aspects without appreciable stenosis.
No visualized M1 stenosis or occlusion. Grossly negative MCA
bifurcations. Distal MCA branches grossly perfused and symmetric.

POSTERIOR CIRCULATION:

Dominant left vertebral artery patent to the vertebrobasilar
junction without appreciable stenosis. Left PICA patent. Right
vertebral artery hypoplastic, and is seen to be grossly patent on
this exam. This was not visualized on prior study, likely due to
motion artifact. Right PICA not definitely visualized. Basilar
patent to its distal aspect without appreciable stenosis. Superior
cerebral arteries grossly patent at their origins. Both PCAs
supplied via the basilar as well as small bilateral posterior
communicating arteries. PCAs grossly patent to their distal aspects
without appreciable stenosis.
IMPRESSION: 1. Technically limited exam due to severe motion artifact.
2. Grossly negative intracranial MRA. No large vessel occlusion. No
appreciable hemodynamically significant or correctable stenosis on
this motion degraded exam.
3. Hypoplastic right vertebral artery appears grossly patent on this
exam, previously not visualized on recent MRA, and question to be
occluded at that time. This was most likely artifactual nature on
prior study.

## 2019-08-13 MED ORDER — ENSURE ENLIVE PO LIQD
237.0000 mL | Freq: Two times a day (BID) | ORAL | Status: DC
  Administered 2019-08-13 – 2019-08-23 (×18): 237 mL via ORAL
  Filled 2019-08-13: qty 237

## 2019-08-13 NOTE — Consult Note (Addendum)
[address]       [place] [address]             [phone]          CARDIOTHORACIC SURGERY CONSULTATION REPORT  PCP is Dixie Dials, MD Referring Provider is Darrelyn Hillock, DO Primary Cardiologist is No primary care provider on file.  Reason for consultation:  Mitral valve mass  HPI:  Patient is a 74 yo AA female nursing home resident with extreme morbid obesity, multiple recent embolic strokes, hypertension, pulmonary hypertension with cor-pulmonale, chronic diastolic and right-sided heart failure, chronic hypoxemia, small PFO with right to left shunt, recent DVT in RLE on Eliquis, rheumatoid arthritis on chronic methotrexate, chronic incontinence and chronic pain who has been referred for surgical consultation due to the presence of small mass adherent to the mitral valve noted on recent TEE.  Patient with multiple chronic medical problems as noted above was initially admitted to the hospital August 01, 2019 with right-sided weakness and diagnosed with acute stroke likely of embolic origin.  MRI revealed small acute cortical subcortical infarct involving the left pre and post central gyri suggestive of embolic origin.  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  Other findings noted on transthoracic and transesophageal echocardiogram revealed normal left ventricular systolic function, moderate right ventricular systolic function with severe right ventricular chamber enlargement, moderate tricuspid regurgitation, and pulmonary hypertension.  There was no evidence of clot in the left atrial appendage.  Carotid duplex and MRA were notable for the absence of significant intracranial or extracranial cerebrovascular disease.  Patient had no evidence of atrial fibrillation during hospital admission.  Cardiology consult was  obtained for possible placement of loop recorder.  Cardiothoracic surgical consultation was not requested at that time.  The patient was initially discharged to a skilled nursing facility on oral Eliquis for anticoagulation but readmitted to the hospital with increased confusion, right-sided facial droop, right-sided weakness, and both receptive and expressive aphasia.  Repeat MRI revealed enlarged area of previous acute stroke with possible new areas of stroke in the left cerebral hemisphere.  Cardiothoracic surgical consultation was requested.  Patient is currently alone in her room, accompanied only by her nurse.  There is no family present at this time and I personally attempted to contact the patient's daughter over the telephone with no answer.  Patient is awake but unable to answer questions due to confusion with at least some significant degree of aphasia.  Speech is garbled.  She is extremely morbidly obese.  According to the nurse the patient had very limited physical mobility with assistance during her last previous hospitalization.  She appears to be breathing comfortably on nasal cannula oxygen.  She shakes her head no to questions regarding pain.  Past Medical History:  Diagnosis Date  . Anemia    takes Ferrous Sulfate daily  . Arthritis   . Chronic bronchitis ([place])    Combivent if needed)  . Colostomy care [facility]) 2004  . Coronary artery disease   . Diverticular disease   . Dizziness   . Dry eyes    uses Restasis daily  . H/O hiatal hernia   . History of blood transfusion    no abnormal reaction noted  . History of colon polyps    benign  . Hyperlipidemia    takes Zocor daily  . Hypertension  takes Metoprolol daily  . Hypothyroidism    takes Synthroid daily  . Nocturia    states only d/t being on Lasix  . Numbness and tingling    left foot  . Peripheral edema    takes Lasix daily  . Rheumatoid arthritis(714.0)    Methotrexate 2 times a week  . Shortness of breath      with exertion    Past Surgical History:  Procedure Laterality Date  . ABDOMINAL HYSTERECTOMY  1965  . CAPSULOTOMY Bilateral 06/01/2012   Procedure: MINOR CAPSULOTOMY;  Surgeon: Myrtha Mantis., MD;  Location: [place];  Service: Ophthalmology;  Laterality: Bilateral;  . CATARACT EXTRACTION W/PHACO  01/01/2011   Procedure: CATARACT EXTRACTION PHACO AND INTRAOCULAR LENS PLACEMENT (IOC);  Surgeon: [name], MD;  Location: [place];  Service: Ophthalmology;  Laterality: Left;  . CATARACT EXTRACTION W/PHACO  12/24/2011   Procedure: CATARACT EXTRACTION PHACO AND INTRAOCULAR LENS PLACEMENT (IOC);  Surgeon: [name], MD;  Location: [place];  Service: Ophthalmology;  Laterality: Right;  . COLON SURGERY  2002   d/t diverticulosis  . COLONOSCOPY    . COLOSTOMY    . ESOPHAGOGASTRODUODENOSCOPY    . EYE SURGERY    . KNEE ARTHROSCOPY     bilateral  . TEE WITHOUT CARDIOVERSION N/A 08/04/2019   Procedure: TRANSESOPHAGEAL ECHOCARDIOGRAM (TEE);  Surgeon: Geralynn Rile, MD;  Location: [place];  Service: Cardiovascular;  Laterality: N/A;  . TONSILLECTOMY    . TOTAL KNEE ARTHROPLASTY  2005/2008   bilateral  . TOTAL KNEE REVISION Right 10/24/2015   Procedure: TOTAL KNEE REVISION;  Surgeon: [name], MD;  Location: [place];  Service: Orthopedics;  Laterality: Right;  . YAG LASER APPLICATION Bilateral 05/03/9627   Procedure: YAG LASER APPLICATION;  Surgeon: Myrtha Mantis., MD;  Location: [place];  Service: Ophthalmology;  Laterality: Bilateral;    Family History  Problem Relation Age of Onset  . Anesthesia problems Neg Hx   . Hypotension Neg Hx   . Malignant hyperthermia Neg Hx   . Pseudochol deficiency Neg Hx     Social History   Socioeconomic History  . Marital status: Single    Spouse name: Not on file  . Number of children: Not on file  . Years of education: Not on file  . Highest education level: Not on file  Occupational History  . Not on file  Tobacco Use  .  Smoking status: Former Smoker    Quit date: 10/22/2002    Years since quitting: 16.8  . Smokeless tobacco: Never Used  . Tobacco comment: quit smoking in 2004  Substance and Sexual Activity  . Alcohol use: No    Comment: quit drinking in 2004  . Drug use: No  . Sexual activity: Never    Birth control/protection: Surgical  Other Topics Concern  . Not on file  Social History Narrative  . Not on file   Social Determinants of Health   Financial Resource Strain:   . Difficulty of Paying Living Expenses:   Food Insecurity:   . Worried About Charity fundraiser in the Last Year:   . Arboriculturist in the Last Year:   Transportation Needs:   . Film/video editor (Medical):   Marland Kitchen Lack of Transportation (Non-Medical):   Physical Activity:   . Days of Exercise per Week:   . Minutes of Exercise per Session:   Stress:   . Feeling of Stress :   Social Connections:   .  Frequency of Communication with Friends and Family:   . Frequency of Social Gatherings with Friends and Family:   . Attends Religious Services:   . Active Member of Clubs or Organizations:   . Attends Archivist Meetings:   Marland Kitchen Marital Status:   Intimate Partner Violence:   . Fear of Current or Ex-Partner:   . Emotionally Abused:   Marland Kitchen Physically Abused:   . Sexually Abused:     Prior to Admission medications   Medication Sig Start Date End Date Taking? Authorizing Provider  apixaban (ELIQUIS) 5 MG TABS tablet Take 1 tablet (5 mg total) by mouth every 12 (twelve) hours. 08/06/19  Yes [name], DO  cycloSPORINE (RESTASIS) 0.05 % ophthalmic emulsion Place 1 drop into both eyes 3 (three) times daily as needed (dry eyes).    Yes [provider]  etanercept (ENBREL) 50 MG/ML injection Inject 50 mg into the skin every Friday.   Yes [provider]  folic acid (FOLVITE) 1 MG tablet Take 1 mg by mouth daily.     Yes [provider]  HYDROcodone-acetaminophen (NORCO) 10-325 MG  tablet Take 1 tablet by mouth 3 (three) times daily as needed for severe pain.    Yes [provider]  hydroxychloroquine (PLAQUENIL) 200 MG tablet Take 200 mg by mouth 2 (two) times daily. 07/25/19  Yes [provider]  leflunomide (ARAVA) 10 MG tablet Take 10 mg by mouth daily. 07/25/19  Yes [provider]  levothyroxine (SYNTHROID, LEVOTHROID) 50 MCG tablet Take 50 mcg by mouth daily.     Yes [provider]  metoprolol tartrate (LOPRESSOR) 25 MG tablet Take 0.5 tablets (12.5 mg total) by mouth 2 (two) times daily. 08/06/19  Yes Beard, Samantha N, DO  predniSONE (DELTASONE) 5 MG tablet Take 5 mg by mouth daily. 07/25/19  Yes [provider]  rosuvastatin (CRESTOR) 20 MG tablet Take 1 tablet (20 mg total) by mouth daily at 6 PM. 08/06/19  Yes [name], DO  acetaminophen (TYLENOL) 500 MG tablet Take 2 tablets (1,000 mg total) by mouth every 6 (six) hours as needed for mild pain (or Fever >/= 101). 08/10/19   Autry-Lott, Naaman Plummer, DO  doxycycline (VIBRA-TABS) 100 MG tablet Take 1 tablet (100 mg total) by mouth every 12 (twelve) hours. Your whole course of this antibiotic is five days. Upon discharge, you will only have three days left. We have included only enough antibiotic for the remaining three days. 08/10/19   Autry-Lott, Naaman Plummer, DO  furosemide (LASIX) 20 MG tablet Take 1 tablet (20 mg total) by mouth every other day. 08/10/19   Autry-Lott, Naaman Plummer, DO  polyethylene glycol (MIRALAX / GLYCOLAX) 17 g packet Take 17 g by mouth daily as needed for mild constipation. 08/10/19   Autry-Lott, Naaman Plummer, DO  tiotropium (SPIRIVA HANDIHALER) 18 MCG inhalation capsule Place 1 capsule (18 mcg total) into inhaler and inhale daily. 08/10/19   Autry-Lott, Naaman Plummer, DO    Current Facility-Administered Medications  Medication Dose Route Frequency Provider Last Rate Last Admin  . acetaminophen (TYLENOL) tablet 1,000 mg  1,000 mg Oral Q6H PRN Ouida Sills, Chelsey L, DO   1,000 mg  at 08/10/19 1645   Or  . acetaminophen (TYLENOL) suppository 650 mg  650 mg Rectal Q6H PRN Anderson, Chelsey L, DO      . cycloSPORINE (RESTASIS) 0.05 % ophthalmic emulsion 1 drop  1 drop Both Eyes TID PRN Anderson, Chelsey L, DO      . doxycycline (VIBRA-TABS) tablet  100 mg  100 mg Oral Q12H Beard, Samantha N, DO   100 mg at 08/13/19 1000  . furosemide (LASIX) injection 40 mg  40 mg Intravenous Daily Geralynn Rile, MD   40 mg at 08/13/19 1001  . heparin ADULT infusion 100 units/mL (25000 units/256m sodium chloride 0.45%)  1,200 Units/hr Intravenous Continuous PLlana Aliment RPH 12 mL/hr at 08/13/19 0957 1,200 Units/hr at 08/13/19 0957  . HYDROcodone-acetaminophen (NORCO) 10-325 MG per tablet 1 tablet  1 tablet Oral TID PRN AOuida Sills Chelsey L, DO   1 tablet at 08/10/19 1001  . hydroxychloroquine (PLAQUENIL) tablet 200 mg  200 mg Oral BID Anderson, Chelsey L, DO   200 mg at 08/13/19 1000  . leflunomide (ARAVA) tablet 10 mg  10 mg Oral Daily Anderson, Chelsey L, DO   10 mg at 08/13/19 1358  . levothyroxine (SYNTHROID) tablet 50 mcg  50 mcg Oral Q0600 AOuida Sills Chelsey L, DO   50 mcg at 08/13/19 02458 . metoprolol tartrate (LOPRESSOR) tablet 12.5 mg  12.5 mg Oral BID Anderson, Chelsey L, DO   12.5 mg at 08/13/19 1001  . polyethylene glycol (MIRALAX / GLYCOLAX) packet 17 g  17 g Oral Daily PRN AOuida Sills Chelsey L, DO      . predniSONE (DELTASONE) tablet 5 mg  5 mg Oral Daily Anderson, Chelsey L, DO   5 mg at 08/13/19 1001  . rosuvastatin (CRESTOR) tablet 20 mg  20 mg Oral q1800 Anderson, Chelsey L, DO   20 mg at 08/12/19 1735    No Known Allergies    Review of Systems:  Per HPI - no other sensible ROS can be obtained at this time     Physical Exam:   BP (!) 188/68 (BP Location: Right Leg)   Pulse 84   Temp 100 F (37.8 C)   Resp 18   Ht _0  (1.6 m)   Wt 130.6 kg   SpO2 96%   BMI 51.00 kg/m   General:  Elderly, morbidly-obese, chronically ill-appearing with likely  severe deconditioning  HEENT:  Unremarkable other than poor dentition  Neck:   no JVD, no bruits, no adenopathy   Chest:   clear to auscultation, diminished breath sounds at bases, no wheezes, no rhonchi   CV:   RRR, no murmur   Abdomen:  soft, non-tender, no masses   Extremities:  warm, adequately-perfused, pulses not palpable, + bilateral lower extremity edema  Rectal/GU  Deferred  Neuro:   Garbled speech with expressive aphasia, moves right arm some but weak  Skin:   Clean and dry  Diagnostic Tests:  Lab Results: Recent Labs    08/11/19 0304 08/13/19 0420  WBC 7.1 5.2  HGB 10.8* 12.5  HCT 35.9* 40.8  PLT 303 PLATELET CLUMPS NOTED ON SMEAR, UNABLE TO ESTIMATE   BMET:  Recent Labs    08/12/19 0200 08/13/19 0420  NA 140 137  K 4.2 5.1  CL 103 99  CO2 29 25  GLUCOSE 82 84  BUN 29* 25*  CREATININE 1.57* 1.42*  CALCIUM 8.8* 9.4    CBG (last 3)  No results for input(s): GLUCAP in the last 72 hours. PT/INR:  No results for input(s): LABPROT, INR in the last 72 hours.  CXR:  PORTABLE CHEST 1 VIEW  COMPARISON:  August 08, 2019 chest radiograph and CT angiogram chest  FINDINGS: There is airspace opacity in the lung bases, similar to recent studies with small right pleural effusion. Heart is mildly enlarged with pulmonary  vascularity normal. No adenopathy. There is aortic atherosclerosis. No bone lesions.  IMPRESSION: Patchy airspace opacity in the lung bases with small right pleural effusion. Stable cardiac prominence. No new opacity evident.  Aortic Atherosclerosis (ICD10-I70.0).   Electronically Signed   By: Lowella Grip III M.D.   On: 08/11/2019 10:54   MRI HEAD WITHOUT CONTRAST  TECHNIQUE: Multiplanar, multiecho pulse sequences of the brain and surrounding structures were obtained without intravenous contrast.  COMPARISON:  Noncontrast head CT performed earlier the same day 08/01/2019.  FINDINGS: Brain:  The patient was unable to  tolerate the full examination. As a result, only axial and coronal diffusion-weighted sequences as well as a sagittal T1 weighted sequence could be obtained. There is moderate/severe motion degradation of the sagittal T1 weighted sequence.  There is a small focus of cortical/subcortical restricted diffusion affecting the left pre and postcentral gyri consistent with acute infarction (for instance as seen on series 2, image 37). These findings would account for the reported right hand weakness.  Additional small acute infarct within the right cerebellum (series 2, image 13)  Vascular: Poorly assessed on the acquired sequences.  Skull and upper cervical spine: Focal marrow lesion identified on the acquired sequences.  Sinuses/Orbits: Poorly assessed on the acquired sequences.  IMPRESSION: Prematurely terminated, motion degraded and limited examination as described.  Small acute cortical/subcortical infarct affecting the left pre- and postcentral gyri. This finding would account for the patient's reported right hand weakness.  Additional subcentimeter acute infarct within the right cerebellum.   Electronically Signed   By: Kellie Simmering DO   On: 08/01/2019 17:10   MRI HEAD WITHOUT CONTRAST  TECHNIQUE: Multiplanar, multiecho pulse sequences of the brain and surrounding structures were obtained without intravenous contrast.  COMPARISON:  MRI/MRA head 08/01/2019.  FINDINGS: Brain:  Multiple sequences are significantly motion degraded. Most notably, there is moderate motion degradation of the coronal diffusion-weighted sequence, severe motion degradation of the sagittal T1 weighted sequence, moderate/severe motion degradation of the axial T2 weighted sequence, severe motion degradation of the axial T2/FLAIR sequences, moderate motion degradation of the axial T1 weighted sequence, severe motion degradation of the coronal T2 weighted sequence.  There has  been significant interval progression of an acute/early subacute left MCA territory infarct as compared to the MRI of 08/01/2019. There is now a moderate to large acute/early subacute cortical/subcortical infarct within the left temporoparietal lobes and posterior left insula. Redemonstrated small subacute cortically based infarcts within the left pre and postcentral gyri. New punctate acute/early subacute infarct within the mid left frontal lobe (series 2, image 34). A subcentimeter subacute infarct within the left cerebellar hemisphere was better appreciated on the prior MRI.  There is no significant mass effect at this time. No ventricular effacement or midline shift.  Stable generalized parenchymal atrophy.  Tiny chronic lacunar infarcts within the cerebellar hemispheres bilaterally.  No appreciable chronic intracranial blood products.  No extra-axial fluid collection is identified.  No midline shift.  Vascular: Flow voids poorly assessed due to the degree of motion degradation.  Skull and upper cervical spine: No focal marrow lesion identified within the limitations of motion degradation  Sinuses/Orbits: No acute orbital abnormality identified. Mild ethmoid sinus mucosal thickening. Right mastoid effusion  IMPRESSION: Significantly motion degraded and limited examination as described.  Marked interval increase in extent of an acute/early subacute left MCA territory cortical/subcortical infarct. A moderate to large confluent infarct is now present within the left temporal and parietal lobes as well as posterior left insula. No significant  mass effect at this time.  Redemonstrated small cortically based subacute infarcts within the left pre and postcentral gyri.  New punctate acute/early subacute infarct within the mid left frontal lobe white matter.  A subcentimeter subacute infarct within the right cerebellum was better appreciated on the prior MRI  of 08/01/2019.  Stable generalized parenchymal atrophy.  Small chronic infarcts within the bilateral cerebellar hemispheres.  Mild ethmoid sinus mucosal thickening. Right mastoid effusion.   Electronically Signed   By: Kellie Simmering DO   On: 08/12/2019 11:17   MRA HEAD WITHOUT CONTRAST  TECHNIQUE: Angiographic images of the Circle of Willis were obtained using MRA technique without intravenous contrast.  COMPARISON:  Prior study from 08/01/2019.  FINDINGS: ANTERIOR CIRCULATION:  Examination severely degraded by motion artifact.  Visualized distal cervical segments of the internal carotid arteries are grossly patent with symmetric antegrade flow. Petrous, cavernous, and supraclinoid ICAs patent without appreciable stenosis or other abnormality. A1 segments grossly patent. Grossly normal anterior communicating artery complex. Anterior cerebral arteries well perfused to their distal aspects without appreciable stenosis. No visualized M1 stenosis or occlusion. Grossly negative MCA bifurcations. Distal MCA branches grossly perfused and symmetric.  POSTERIOR CIRCULATION:  Dominant left vertebral artery patent to the vertebrobasilar junction without appreciable stenosis. Left PICA patent. Right vertebral artery hypoplastic, and is seen to be grossly patent on this exam. This was not visualized on prior study, likely due to motion artifact. Right PICA not definitely visualized. Basilar patent to its distal aspect without appreciable stenosis. Superior cerebral arteries grossly patent at their origins. Both PCAs supplied via the basilar as well as small bilateral posterior communicating arteries. PCAs grossly patent to their distal aspects without appreciable stenosis.  IMPRESSION: 1. Technically limited exam due to severe motion artifact. 2. Grossly negative intracranial MRA. No large vessel occlusion. No appreciable hemodynamically significant or correctable  stenosis on this motion degraded exam. 3. 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 This was most likely artifactual nature on prior study.   Electronically Signed   By: Jeannine Boga M.D.   On: 08/13/2019 01:35   CT ANGIOGRAPHY CHEST WITH CONTRAST  TECHNIQUE: Multidetector CT imaging of the chest was performed using the standard protocol during bolus administration of intravenous contrast. Multiplanar CT image reconstructions and MIPs were obtained to evaluate the vascular anatomy.  CONTRAST:  76m OMNIPAQUE IOHEXOL 350 MG/ML SOLN  COMPARISON:  08/05/2019  FINDINGS: Cardiovascular: There is excellent opacification of the pulmonary arterial tree. There is no intraluminal filling defect to suggest acute pulmonary embolism. The central pulmonary arteries are markedly dilated in keeping with changes of pulmonary arterial hypertension. There is mild global cardiac enlargement with particular enlargement of the right ventricle and right atrium suggesting changes of elevated right heart pressure. There is extensive coronary artery calcification. No pericardial effusion. The thoracic aorta is age-appropriate. No aneurysm.  Mediastinum/Nodes: No pathologic thoracic adenopathy.  Lungs/Pleura: There is bibasilar consolidation, similar to that noted on prior examination, more prevalent within the a dependent right lower lobe. Scattered nodular infiltrate is also noted within the right upper lobe laterally. These are new since remote prior examination of 01/02/2005. In the acute setting, this may reflect changes of atypical infection or aspiration. There is stable elevation of the right hemidiaphragm with right basilar compressive atelectasis.  Upper Abdomen: Cholecystectomy has been performed. Limited images of the upper abdomen are otherwise  unremarkable.  Musculoskeletal: No lytic  or blastic bone lesion.  Review of the MIP images confirms the above findings.  IMPRESSION: Stable asymmetric bibasilar consolidation, most in keeping with aspiration or infection in the acute setting.  No pulmonary embolism.  Morphologic changes in keeping with pulmonary arterial hypertension and elevated right heart pressure. Superimposed extensive coronary artery calcification.  Aortic Atherosclerosis (ICD10-I70.0).   Electronically Signed   By: Fidela Salisbury MD   On: 08/08/2019 21:28    ECHOCARDIOGRAM REPORT       Patient Name:  [name] Date of Exam: 08/02/2019  Medical Rec #: [id number]  Height:    63.0 in  Accession #:  [id number]  Weight:    282.6 lb  Date of Birth: 07/25/1945  BSA:     2.240 m  Patient Age:  13 years   BP:      139/69 mmHg  Patient Gender: F      HR:      87 bpm.  Exam Location: Inpatient   Procedure: 2D Echo, Cardiac Doppler and Color Doppler   Indications:  Stroke    History:    Patient has prior history of Echocardiogram examinations,  most         recent 07/01/2017. Stroke and TIA; Signs/Symptoms:Dyspnea.    Sonographer:  Roseanna Rainbow RDCS  Referring Phys: Francis Dowse Gwyndolyn Saxon A HENSEL     Sonographer Comments: Patient is morbidly obese and Technically difficult  study due to poor echo windows. Image acquisition challenging due to  patient body habitus.  IMPRESSIONS    1. Vigorous LV systolic function; grade 1 diastolic dysfunction; mild  LVH; D shaped septum suggesting pulmonary hypertension; mildly elevated  velocity across aortic valve suggesting mild AS but partially explained by  vigorous LV function; mild AI; mild  RAE; moderate RVE with severe RV dysfunction; moderate TR with severe  pulmonary hypertension. There is an oscillating density on MV; cannot R/O  vegetation; suggest TEE to further assess.  2. Left  ventricular ejection fraction, by estimation, is 60 to 65%. The  left ventricle has normal function. The left ventricle has no regional  wall motion abnormalities. There is mild left ventricular hypertrophy.  Left ventricular diastolic parameters  are consistent with Grade I diastolic dysfunction (impaired relaxation).  3. Right ventricular systolic function is severely reduced. The right  ventricular size is moderately enlarged. There is severely elevated  pulmonary artery systolic pressure.  4. Right atrial size was mildly dilated.  5. The mitral valve is normal in structure. No evidence of mitral valve  regurgitation. No evidence of mitral stenosis.  6. Tricuspid valve regurgitation is moderate.  7. The aortic valve is tricuspid. Aortic valve regurgitation is mild.  Mild aortic valve stenosis.  8. The inferior vena cava is normal in size with <50% respiratory  variability, suggesting right atrial pressure of 8 mmHg.   FINDINGS  Left Ventricle: Left ventricular ejection fraction, by estimation, is 60  to 65%. The left ventricle has normal function. The left ventricle has no  regional wall motion abnormalities. The left ventricular internal cavity  size was normal in size. There is  mild left ventricular hypertrophy. Left ventricular diastolic parameters  are consistent with Grade I diastolic dysfunction (impaired relaxation).   Right Ventricle: The right ventricular size is moderately enlarged.Right  ventricular systolic function is severely reduced. There is severely  elevated pulmonary artery systolic pressure. The tricuspid regurgitant  velocity is 4.66 m/s, and with an assumed  right atrial pressure of 8 mmHg, the estimated right ventricular  systolic  pressure is 35.5 mmHg.   Left Atrium: Left atrial size was normal in size.   Right Atrium: Right atrial size was mildly dilated.   Pericardium: There is no evidence of pericardial effusion.   Mitral Valve: The mitral  valve is normal in structure. Normal mobility of  the mitral valve leaflets. Mild mitral annular calcification. No evidence  of mitral valve regurgitation. No evidence of mitral valve stenosis. MV  peak gradient, 9.5 mmHg. The mean  mitral valve gradient is 2.5 mmHg.   Tricuspid Valve: The tricuspid valve is normal in structure. Tricuspid  valve regurgitation is moderate . No evidence of tricuspid stenosis.   Aortic Valve: The aortic valve is tricuspid. Aortic valve regurgitation is  mild. Mild aortic stenosis is present. Aortic valve mean gradient measures  10.5 mmHg. Aortic valve peak gradient measures 22.9 mmHg. Aortic valve  area, by VTI measures 1.92 cm.   Pulmonic Valve: The pulmonic valve was normal in structure. Pulmonic valve  regurgitation is not visualized. No evidence of pulmonic stenosis.   Aorta: The aortic root is normal in size and structure.   Venous: The inferior vena cava is normal in size with less than 50%  respiratory variability, suggesting right atrial pressure of 8 mmHg.   IAS/Shunts: No atrial level shunt detected by color flow Doppler.   Additional Comments: Vigorous LV systolic function; grade 1 diastolic  dysfunction; mild LVH; D shaped septum suggesting pulmonary hypertension;  mildly elevated velocity across aortic valve suggesting mild AS but  partially explained by vigorous LV  function; mild AI; mild RAE; moderate RVE with severe RV dysfunction;  moderate TR with severe pulmonary hypertension. There is an oscillating  density on MV; cannot R/O vegetation; suggest TEE to further assess.     LEFT VENTRICLE  PLAX 2D  LVIDd:     3.59 cm   Diastology  LVIDs:     2.02 cm   LV e' lateral:  7.51 cm/s  LV PW:     1.34 cm   LV E/e' lateral: 10.8  LV IVS:    1.57 cm   LV e' medial:  5.44 cm/s  LVOT diam:   2.30 cm   LV E/e' medial: 14.9  LV SV:     71  LV SV Index:  32  LVOT Area:   4.15 cm    LV Volumes  (MOD)  LV vol d, MOD A2C: 41.8 ml  LV vol d, MOD A4C: 51.6 ml  LV vol s, MOD A2C: 9.7 ml  LV vol s, MOD A4C: 10.8 ml  LV SV MOD A2C:   32.1 ml  LV SV MOD A4C:   51.6 ml  LV SV MOD BP:   36.1 ml   RIGHT VENTRICLE       IVC  RV S prime:   11.70 cm/s IVC diam: 2.04 cm  TAPSE (M-mode): 2.0 cm   LEFT ATRIUM      Index    RIGHT ATRIUM      Index  LA diam:   2.80 cm 1.25 cm/m RA Area:   19.00 cm  LA Vol (A2C): 57.3 ml 25.58 ml/m RA Volume:  59.20 ml 26.43 ml/m  LA Vol (A4C): 28.1 ml 12.55 ml/m  AORTIC VALVE  AV Area (Vmax):  1.89 cm  AV Area (Vmean):  1.96 cm  AV Area (VTI):   1.92 cm  AV Vmax:      239.50 cm/s  AV Vmean:     150.500 cm/s  AV VTI:      0.372 m  AV Peak Grad:   22.9 mmHg  AV Mean Grad:   10.5 mmHg  LVOT Vmax:     109.00 cm/s  LVOT Vmean:    71.000 cm/s  LVOT VTI:     0.172 m  LVOT/AV VTI ratio: 0.46    AORTA  Ao Root diam: 3.60 cm  Ao Asc diam: 3.20 cm   MITRAL VALVE        TRICUSPID VALVE  MV Area (PHT): 2.45 cm   TR Peak grad:  86.9 mmHg  MV Peak grad: 9.5 mmHg   TR Vmax:    466.00 cm/s  MV Mean grad: 2.5 mmHg  MV Vmax:    1.54 m/s   SHUNTS  MV Vmean:   69.4 cm/s  Systemic VTI: 0.17 m  MV Decel Time: 310 msec   Systemic Diam: 2.30 cm  MV E velocity: 81.00 cm/s  MV A velocity: 119.00 cm/s  MV E/A ratio: 0.68   Kirk Ruths MD  Electronically signed by Kirk Ruths MD  Signature Date/Time: 08/02/2019/4:07:26 PM       TRANSESOPHOGEAL ECHO REPORT       Patient Name:  [name] Date of Exam: 08/04/2019  Medical Rec #: [id number]  Height:    63.0 in  Accession #:  [id number]  Weight:    282.6 lb  Date of Birth: 03/03/1945  BSA:     2.240 m  Patient Age:  79 years   BP:      180/71 mmHg  Patient Gender: F      HR:      82 bpm.  Exam Location: Inpatient   Procedure: Transesophageal  Echo   Indications:  mitral valve evaluation    History:    Patient has prior history of Echocardiogram examinations,  most         recent 08/02/2019. CAD; Risk Factors:Hypertension,  Dyslipidemia         and Former Smoker.    Sonographer:  Jannett Celestine RDCS (AE)  Referring Phys: [id number] [place]: After discussion of the risks and benefits of a TEE, an  informed consent was obtained from the patient. TEE procedure time was 16  minutes. The transesophogeal probe was passed without difficulty through  the esophogus of the patient. Imaged  were obtained with the patient in a left lateral decubitus position. Local  oropharyngeal anesthetic was provided with Cetacaine. Sedation performed  by different physician. The patient was monitored while under deep  sedation. Anesthestetic sedation was  provided intravenously by Anesthesiology: 227m of Propofol. Image quality  was excellent. The patient's vital signs; including heart rate, blood  pressure, and oxygen saturation; remained stable throughout the procedure.  The patient developed no  complications during the procedure.   IMPRESSIONS    1. There is a 1.1 x 0.7 cm mass attached to the P2 segment of the PMVL.  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 There is no  significant regurgitation of the mitral valve or destruction to suggest  this is a vegetation. This is a native valve  so this is unlikely to represent thrombus. Would recommend blood cultures  to ensure there is no endocarditis. The mitral valve is abnormal. Trivial  mitral valve regurgitation. No evidence of mitral stenosis.  2. Agitated saline contrast bubble study was positive with shunting  observed within 3-6 cardiac cycles suggestive of interatrial shunt. A  moderate size PFO is present with  R to L shunting.  3. Left ventricular ejection fraction, by estimation, is 70 to 75%. The  left ventricle has  hyperdynamic function. The left ventricle has no  regional wall motion abnormalities. There is the interventricular septum  is flattened in systole and diastole,  consistent with right ventricular pressure and volume overload.  4. Right ventricular systolic function is moderately reduced. The right  ventricular size is severely enlarged.  5. No left atrial/left atrial appendage thrombus was detected. The LAA  emptying velocity was 58 cm/s.  6. Right atrial size was moderately dilated.  7. The tricuspid valve is abnormal. Tricuspid valve regurgitation is  moderate to severe.  8. The aortic valve is tricuspid. Aortic valve regurgitation is mild.  Mild aortic valve sclerosis is present, with no evidence of aortic valve  stenosis.  9. There is Moderate (Grade III) layered plaque involving the transverse  aorta and descending aorta.   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 Moderate PFO. Severely dilated RV with moderately  reduced function consistent with pulmonary hypertension.   FINDINGS  Left Ventricle: Left ventricular ejection fraction, by estimation, is 70  to 75%. The left ventricle has hyperdynamic function. The left ventricle  has no regional wall motion abnormalities. The left ventricular internal  cavity size was small. There is no  left ventricular hypertrophy. The interventricular septum is flattened in  systole and diastole, consistent with right ventricular pressure and  volume overload.   Right Ventricle: The right ventricular size is severely enlarged. No  increase in right ventricular wall thickness. Right ventricular systolic  function is moderately reduced.   Left Atrium: Left atrial size was normal in size. No left atrial/left  atrial appendage thrombus was detected. The LAA emptying velocity was 58  cm/s.   Right Atrium: Right atrial size was moderately dilated.   Pericardium: There is no  evidence of pericardial effusion.   Mitral Valve: There is a 1.1 x 0.7 cm mass attached to the P2 segment of  the PMVL. 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 There is no  significant regurgitation of the mitral valve or destruction to suggest  this is a vegetation. This is a  native valve so this is unlikely to represent thrombus. Would recommend  blood cultures to ensure there is no endocarditis. The mitral valve is  abnormal. Trivial mitral valve regurgitation. No evidence of mitral valve  stenosis.   Tricuspid Valve: The tricuspid valve is abnormal. Tricuspid valve  regurgitation is moderate to severe. No evidence of tricuspid stenosis.   Aortic Valve: The aortic valve is tricuspid. . There is mild thickening  and mild calcification of the aortic valve. Aortic valve regurgitation is  mild. Mild aortic valve sclerosis is present, with no evidence of aortic  valve stenosis. There is mild  thickening of the aortic valve. There is mild calcification of the aortic  valve.   Pulmonic Valve: The pulmonic valve was grossly normal. Pulmonic valve  regurgitation is not visualized. No evidence of pulmonic stenosis.   Aorta: The aortic root and ascending aorta are structurally normal, with  no evidence of dilitation. There is moderate (Grade III) layered plaque  involving the transverse aorta and descending aorta.   IAS/Shunts: There is left bowing of the interatrial septum, suggestive of  elevated right atrial pressure. The atrial septum is grossly normal.  Agitated saline contrast was given intravenously to evaluate for  intracardiac shunting. Agitated saline  contrast bubble study was positive with shunting observed within  3-6  cardiac cycles suggestive of interatrial shunt. A moderate size PFO is  present with R to L shunting.       AORTA  Ao Root diam: 3.30 cm  Ao Asc diam: 2.96 cm   TRICUSPID VALVE  TR Peak grad:  55.1 mmHg  TR Vmax:    371.00 cm/s    Eleonore Chiquito MD  Electronically signed by Eleonore Chiquito MD  Signature Date/Time: 08/04/2019/2:26:07 PM      EKG: NSR w/ RBBB    Impression:  I have personally reviewed the patient's most recent transthoracic and transesophageal echocardiograms performed July 6 and August 04, 2019, respectively.  I have also reviewed the patient's most recent MRI of the brain, CT angiogram of the chest, EKG, and medical record.  At the time of the patient's TEE there was a small to moderate size (1.1 x 0.7 cm) somewhat mobile mass adherent to the atrial surface of the posterior mitral valve leaflet.  This mass has appearance most consistent with papillary fibroelastoma, although possible vegetation or thrombus cannot be ruled out.  The patient also had a small patent foramen ovale with right to left shunting and DVT in right lower extremity.  I agree that there is a high likelihood that the mass adherent to the mitral valve may have been the source of the patient's multiple recent embolic strokes.  If the mass is still present at this time she would likely benefit from surgical removal.  However, I would not consider this patient to be a candidate for surgical intervention to prevent further strokes due to her advanced age with extremely complicated comorbid medical problems including cor-pulmonale with severe RV systolic dysfunction, chronic hypoxemia, severe physical deconditioning, and significant ongoing neurologic deficits.  I have personally tried to contact the patient's daughter Jazzma Neidhardt over the telephone this afternoon.  There was no answer.  I am happy to speak with her family at another time if desired.   I spent in excess of 120 minutes during the conduct of this hospital consultation and >50% of this time involved direct face-to-face encounter for counseling and/or coordination of the patient's care.    Valentina Gu. Roxy Manns, MD 08/13/2019 2:02 PM

## 2019-08-13 NOTE — Progress Notes (Addendum)
ANTICOAGULATION CONSULT NOTE - Follow-up  Pharmacy Consult for heparin Indication: stroke, DVT 08/05/19  No Known Allergies  Patient Measurements: Height: _0  (160 cm) Weight: 130.6 kg (287 lb 14.7 oz) IBW/kg (Calculated) : 52.4 Heparin Dosing Weight: 85kg  Vital Signs: Temp: 98.3 F (36.8 C) (07/17 1641) BP: 129/61 (07/17 1641) Pulse Rate: 80 (07/17 1641)  Labs: Recent Labs    08/11/19 0304 08/11/19 0304 08/11/19 1535 08/12/19 0200 08/13/19 0420 08/13/19 0702 08/13/19 1644  HGB 10.8*  --   --   --  12.5  --   --   HCT 35.9*  --   --   --  40.8  --   --   PLT 303  --   --   --  PLATELET CLUMPS NOTED ON SMEAR, UNABLE TO ESTIMATE  --   --   APTT  --   --   --   --   --  32 43*  HEPARINUNFRC  --   --   --   --   --  1.50*  --   CREATININE 2.01*   < > 1.80* 1.57* 1.42*  --   --    < > = values in this interval not displayed.    Estimated Creatinine Clearance: 47.3 mL/min (A) (by C-G formula based on SCr of 1.42 mg/dL (H)).   Medical History: Assessment: 74 year old W starting on heparin for acute CVA (enlargement of infarct at L MCA territory and new infarct in L frontal lobe) while on apixaban. 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 Plan is to hold apixaban, start heparin and resume apixaban if CT head is stable in 3-5 days. Last dose of apixaban given 7/16 at 9:30am.  -aPTT= 43  Goal of Therapy:  Heparin level 0.3- 0.5 units/ml Monitor platelets by anticoagulation protocol: Yes  APTT goal: 66-85   Plan:  Increase heparin drip 1450 units/hr, no bolus F/u aPTT until correlates with HL  Monitor daily aPTT, HL, CBC/plt Monitor for signs/symptoms of bleeding    Hildred Laser, PharmD Clinical Pharmacist **Pharmacist phone directory can now be found on [email or web address] ([email or web address]).  Listed under [place].

## 2019-08-13 NOTE — Progress Notes (Addendum)
Family Medicine Teaching Service Daily Progress Note Intern Pager: [phone]  Patient name: [name] Medical record number: [id number] Date of birth: 09/23/1945 Age: 74 y.o. Gender: female  Primary Care Provider: Dixie Dials, MD Consultants: None Code Status: FULL  Pt Overview and Major Events to Date:  Admitted 7/12  Assessment and Plan: [name] a 74 y.o.femalepresenting after a positive result on Blood culture post discharge to a SNF.PMH is significant for CVA,hyperlipidemia, hypertension, anemia, and rheumatoid arthritis.  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 Unfortunately significant expressive/receptive aphasia.  Recent CVA on 7/5, with acute marked interval increase in MCA infarct with new mod-large infarct in left temporal/parietal lobes and new punctate acute/early subacute infarct in frontal lobe. In setting of mitral papillary fibroelastoma, PFO/R DVT.  Remainder of stroke work-up already completed during 7/5 hospitalization. -Neurology on board, appreciate recommendations -Cardiology consulted yesterday, high suspicion papillary fibroelastoma is etiology of recurrent strokes -Consult CVTS this morning, however understandably realizing she is a poor surgical candidate -Heparin GTT, stroke protocol -Frequent neuro checks -PT/OT evaluations -Appreciate speech recommendations and assistance with aphasia -Continue Crestor -Repeat CT head in 3-5 days, likely to restart Eliquis if no hemorrhagic conversion --Can consider palliative discussion in the future, pending clinical improvement   Acute hypoxic respiratory failure, in setting of cor pulmonale  ?HAP: Improving.   Still on 1-2L [place]. restarted IV diuresis per cardiology yesterday, will monitor closely as had quick AKI following diuresis earlier in admission. -Strict I and O's -Daily weights -IV Lasix today, reassess daily -Wean to room air as  possible -Continue doxycycline, day 5 today, 7/17  Severe RV dysfunction with cor pulmonale  Group III PAH: Chronic. Has been present long-term, however she has not followed up on the outpatient realm. -Consider pulmonology consult while inpatient, pending disposition for primary concern as above -Oxygen therapy -Attempting diuresis as above (IV Lasix 40)   AKI on CKD II: Improving. CR 1.4 today, from 1.57. -Restarted diuresis yesterday, will monitor closely -Strict I and O's -Monitor BMP  R DVT: Stable. Age-indeterminate DVT found in right peroneal veins on 7/9. -Heparin GTT for above, will transfer back to Eliquis when appropriate  Previous Corynebacterium Bcx contaminant: Resolved.   Patient was discharged to SNF on 7/10 with cultures pending; however they came back positive for corynebacterium, felt to be contaminant.  Repeat culture has shown no growth 4 days.  HTN: Chronic, stable. Elevated, allowing permissive due to acute CVA. -Will continue low-dose home metoprolol for now -Monitor BP  Rheumatoid arthritis- stable, chronic Patient takes prednisone 5 mg, leflunomide, and hydroxychloroquine.  - Continue home meds - Protonix  Hyperlipidemia- stable and chronic Patient is post- CVA and has a hx HLD. Patient takes rosuvastatin at home. - Continue home meds  Chronic pain syndrome - Continue home Norco 10-325 TID PRN  Hypothyroidism- Chronic and stable Patient takes levothyroxine. - Continue home medication  FEN/GI:Heart healthy Prophylaxis:Heparin drip  Disposition: Anticipate several more days of inpatient medical care, will likely DC back to SNF.  Subjective:  No acute events overnight.  She remains significantly aphasic.   Objective: Temp:  [98.1 F (36.7 C)-99 F (37.2 C)] 98.7 F (37.1 C) (07/17 0342) Pulse Rate:  [74-87] 83 (07/17 0342) Resp:  [18-20] 18 (07/17 0342) BP: (142-175)/(63-104) 157/73 (07/17 0342) SpO2:  [91 %-100 %] 100 %  (07/17 0342) Physical Exam: General: Alert, NAD HEENT: NCAT Cardiac: RRR Lungs: Clear anteriorly, no increased WOB, currently on 2L  [place] Abdomen: soft, non-tender Msk: Moves all extremities spontaneously  Ext: Warm, dry, 2+ distal pulses, 1+ pitting edema bilaterally to lotion Neuro: Alert. > 50% of speech is slurred and incomprehensible.  Right-sided mild facial droop.  Expressive and receptive aphasia.  Able to follow simple commands occasionally.  EOMI.Can move all extremities spontaneously, decreased strength on the right.  Laboratory: Recent Labs  Lab 08/10/19 0337 08/11/19 0304 08/13/19 0420  WBC 7.4 7.1 5.2  HGB 10.6* 10.8* 12.5  HCT 35.6* 35.9* 40.8  PLT 351 303 PLATELET CLUMPS NOTED ON SMEAR, UNABLE TO ESTIMATE   Recent Labs  Lab 08/11/19 1535 08/12/19 0200 08/13/19 0420  NA 139 140 137  K 4.6 4.2 5.1  CL 103 103 99  CO2 _0 BUN 35* 29* 25*  CREATININE 1.80* 1.57* 1.42*  CALCIUM 8.4* 8.8* 9.4  PROT  --   --  7.3  BILITOT  --   --  1.3*  ALKPHOS  --   --  44  ALT  --   --  17  AST  --   --  37  GLUCOSE 102* 82 84    Imaging/Diagnostic Tests: CT Angtio Chest PE with Contrast 7/14 IMPRESSION: Stable asymmetric bibasilar consolidation, most in keeping with aspiration or infection in the acute setting. No pulmonary embolism. Morphologic changes in keeping with pulmonary arterial hypertension and elevated right heart pressure. Superimposed extensive coronary artery calcification. Aortic Atherosclerosis (ICD10-I70.0).  Chest 1 View 7/12 IMPRESSION: As on recent CT multifocal airspace opacities which could be due to asymmetric edema and/or infectious etiology. Small right pleural effusion.  [name], DO 08/13/2019, 7:09 AM PGY-3, [place] Intern pager: [phone], text pages welcome

## 2019-08-13 NOTE — Progress Notes (Signed)
ANTICOAGULATION CONSULT NOTE - Follow-up  Pharmacy Consult for heparin Indication: stroke, DVT 08/05/19  No Known Allergies  Patient Measurements: Height: _0  (160 cm) Weight: 130.6 kg (287 lb 14.7 oz) IBW/kg (Calculated) : 52.4 Heparin Dosing Weight: 85kg  Vital Signs: Temp: 100 F (37.8 C) (07/17 0738) Temp Source: Oral (07/17 0342) BP: 188/68 (07/17 0738) Pulse Rate: 84 (07/17 0738)  Labs: Recent Labs    08/11/19 0304 08/11/19 0304 08/11/19 1535 08/12/19 0200 08/13/19 0420 08/13/19 0702  HGB 10.8*  --   --   --  12.5  --   HCT 35.9*  --   --   --  40.8  --   PLT 303  --   --   --  PLATELET CLUMPS NOTED ON SMEAR, UNABLE TO ESTIMATE  --   APTT  --   --   --   --   --  32  HEPARINUNFRC  --   --   --   --   --  1.50*  CREATININE 2.01*   < > 1.80* 1.57* 1.42*  --    < > = values in this interval not displayed.    Estimated Creatinine Clearance: 47.3 mL/min (A) (by C-G formula based on SCr of 1.42 mg/dL (H)).   Medical History: Assessment: 74 year old W starting on heparin for acute CVA (enlargement of infarct at L MCA territory and new infarct in L frontal lobe) while on apixaban. 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 Plan is to hold apixaban, start heparin and resume apixaban if CT head is stable in 3-5 days. Last dose of apixaban given 7/16 at 9:30am.   Subtherapeutic aPTT level of 32 on 7/17. Confirmed with nurse to ensure IV access was secure and heparin was running at correct rate. HL on 7/17 is 1.50 to be expected due to recent apixaban use. Will increase heparin to achieve therapeutic level.  H/H stable and plt stable. Monitor for signs of bleeding around IV access.  Goal of Therapy:  Heparin level 0.3- 0.5 units/ml Monitor platelets by anticoagulation protocol: Yes  APTT goal: 66-82   Plan:  Increase heparin drip 1200  units/hr, no bolus F/u aPTT in 8 hours to check if therapeutic   F/u aPTT until correlates with HL  Monitor daily aPTT, HL, CBC/plt Monitor for signs/symptoms of bleeding    Cephus Slater, PharmD, [place] Resident [phone] 08/13/2019 8:59 AM   Please check AMION for all [place] phone numbers After 10:00 PM, call [place] [phone]

## 2019-08-13 NOTE — Progress Notes (Signed)
STROKE TEAM PROGRESS NOTE   HISTORY OF PRESENT ILLNESS (per record) [name] is a 74 y.o. 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 Patient returned to [facility] 08/08/19 with acute hypoxemic respiratory failure (CHF) and worsening symptoms of right hemiparesis, right facial droop and aphasia (both receptive and expressive) with extreme difficulty following commands.  LKW: Unknown tpa given?: no, recent stroke Premorbid modified Rankin scale (mRS): 4 NIH stroke score: 14 (2 for not answering questions right, 2 for inability to follow commands such as opening and closing eyes along with hand, 2 for right hemianopsia, 1 for right arm drift, 2 for limb ataxia, 2 for aphasia, 2 for dysarthria, 1 for facial.)  INTERVAL HISTORY I personally reviewed history of presenting illness, electronic medical records and imaging films in PACS. Patient unfortunately return with worsening of her right hemiparesis and severe aphasia and MRI scan shows extension of her recent left MCA infarct is now a lot bigger. She was on anticoagulation with Eliquis for DVT which has since been held and she is now on heparin drip. Cardiothoracic surgery care team has been consulted to consider surgical option for fibroblastoma.    OBJECTIVE Vitals:   08/12/19 1931 08/12/19 2051 08/12/19 2333 08/13/19 0342  BP: (!) 175/63 (!) 162/72 (!) 147/76 (!) 157/73  Pulse: 74 76 74 83  Resp: _0 Temp: 99 F (37.2 C)  98.8 F (37.1 C) 98.7 F (37.1 C)  TempSrc: Oral  Oral Oral  SpO2: 100%  99% 100%  Weight:      Height:        CBC:  Recent Labs  Lab 08/08/19 1625 08/09/19 0247 08/11/19 0304 08/13/19 0420  WBC 7.1   < > 7.1 5.2  NEUTROABS 4.3  --   --   --   HGB 11.3*   < > 10.8* 12.5  HCT 38.3   < > 35.9* 40.8  MCV  84.7   < > 85.7 84.5  PLT 299   < > 303 PLATELET CLUMPS NOTED ON SMEAR, UNABLE TO ESTIMATE   < > = values in this interval not displayed.    Basic Metabolic Panel:  Recent Labs  Lab 08/12/19 0200 08/13/19 0420  NA 140 137  K 4.2 5.1  CL 103 99  CO2 29 25  GLUCOSE 82 84  BUN 29* 25*  CREATININE 1.57* 1.42*  CALCIUM 8.8* 9.4    Lipid Panel:     Component Value Date/Time   CHOL 121 08/02/2019 0552   TRIG 131 08/02/2019 0552   HDL 19 (L) 08/02/2019 0552   CHOLHDL 6.4 08/02/2019 0552   VLDL 26 08/02/2019 0552   LDLCALC 76 08/02/2019 0552   HgbA1c:  Lab Results  Component Value Date   HGBA1C 5.7 (H) 08/02/2019   Urine Drug Screen:     Component Value Date/Time   LABOPIA POSITIVE (A) 08/01/2019 1518   COCAINSCRNUR NONE DETECTED 08/01/2019 1518   LABBENZ NONE DETECTED 08/01/2019 1518   AMPHETMU NONE DETECTED 08/01/2019 1518   THCU NONE DETECTED 08/01/2019 1518   LABBARB NONE DETECTED 08/01/2019 1518    Alcohol Level     Component Value Date/Time   ETH <10 08/01/2019 1349    IMAGING  MR ANGIO HEAD WO CONTRAST 08/13/2019 IMPRESSION:  1. Technically limited exam  due to severe motion artifact.  2. Grossly negative intracranial MRA. No large vessel occlusion. No appreciable hemodynamically significant or correctable stenosis on this motion degraded exam.  3. 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 This was most likely artifactual nature on prior study.   MR BRAIN WO CONTRAST 08/12/2019 IMPRESSION:  Significantly motion degraded and limited examination as described. Marked interval increase in extent of an acute/early subacute left MCA territory cortical/subcortical infarct. A moderate to large confluent infarct is now present within the left temporal and parietal lobes as well as posterior left insula. No significant mass effect at this time.  Redemonstrated small cortically based subacute infarcts within the left pre and postcentral gyri. New punctate acute/early subacute infarct within the mid left frontal lobe white matter. A subcentimeter subacute infarct within the right cerebellum was better appreciated on the prior MRI of 08/01/2019. Stable generalized parenchymal atrophy. Small chronic infarcts within the bilateral cerebellar hemispheres. Mild ethmoid sinus mucosal thickening. Right mastoid effusion.   DG CHEST PORT 1 VIEW 08/11/2019 IMPRESSION:  Patchy airspace opacity in the lung bases with small right pleural effusion. Stable cardiac prominence. No new opacity evident. Aortic Atherosclerosis (ICD10-I70.0).   Trans Esophageal Echocardiogram  08/04/2019 IMPRESSIONS  1. There is a 1.1 x 0.7 cm mass attached to the P2 segment of the PMVL.  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 There is no  significant regurgitation of the mitral valve or destruction to suggest  this is a vegetation. This is a native valve  so this is unlikely to represent thrombus. Would recommend blood cultures  to ensure there is no endocarditis. The mitral valve is abnormal. Trivial  mitral valve regurgitation. No evidence of mitral stenosis.  2. Agitated saline contrast bubble study was positive with shunting  observed within 3-6 cardiac cycles suggestive of interatrial shunt. A  moderate size PFO is present with R to L shunting.  3. Left ventricular ejection fraction, by estimation, is 70 to 75%. The  left ventricle has hyperdynamic function. The left ventricle has no  regional wall motion abnormalities. There is the interventricular septum  is flattened in systole and diastole,  consistent with right ventricular pressure and volume overload.  4. Right ventricular systolic function is moderately reduced. The right  ventricular size is severely enlarged.  5. No left atrial/left atrial appendage thrombus was detected. The LAA   emptying velocity was 58 cm/s.  6. Right atrial size was moderately dilated.  7. The tricuspid valve is abnormal. Tricuspid valve regurgitation is  moderate to severe.  8. The aortic valve is tricuspid. Aortic valve regurgitation is mild.  Mild aortic valve sclerosis is present, with no evidence of aortic valve  stenosis.  9. There is Moderate (Grade III) layered plaque involving the transverse  aorta and descending aorta.   Bilateral Carotid Dopplers  08/02/2019 Summary:  Right Carotid: The extracranial vessels were near-normal with only minimal wall thickening or plaque.  Left Carotid: The extracranial vessels were near-normal with only minimal wallthickening or plaque.  Vertebrals: Bilateral vertebral arteries demonstrate antegrade flow.  Subclavians: Normal flow hemodynamics were seen in bilateral subclavian arteries.    ECG - SR rate 82 BPM. (See cardiology reading for complete details)   PHYSICAL EXAM Blood pressure (!) 157/73, pulse 83, temperature 98.7 F (37.1 C), temperature source Oral, resp. rate 18, height _0  (1.6 m), weight 130.6 kg, SpO2 100 %. Obese  elderly African-American lady not in distress. . Afebrile. Head is nontraumatic. Neck is supple without bruit.    Cardiac exam no murmur or gallop. Lungs are clear to auscultation. Distal pulses are well felt.  Neurological Exam : Awake alert disoriented. Severe aphasia expressive as well as receptive. Able to just speak a few words and she repeats the same phrases mostly nonsensical. Follows only midline and occasional one-step commands. Unable to name or repeat. She blinks to threat more on the left than on the right. Right lower facial weakness. Tongue midline. Right hemiplegia 2-3 /5 strength with decreased tone on the right. Good antigravity strength on the left. Sensation cannot reliably tested. Right plantar upgoing left downgoing. Gait not tested    ASSESSMENT/PLAN [name] is a 74 y.o. 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 S/P loop implant 08/05/19. Patient returned to [facility] 08/08/19 with acute hypoxemic respiratory failure (CHF) and worsening symptoms of right hemiparesis, right facial droop and aphasia (both receptive and expressive) with extreme difficulty following commands.  She did not receive IV t-PA due to recent stroke.  Stroke: Marked interval increase in extent of an acute/early subacute left MCA territory cortical/subcortical infarct - embolic - source unknown.  Resultant aphasia and right hemiplegia code Stroke CT Head - not ordered  CT head - not ordered  MRI head - Marked interval increase in extent of an acute/early subacute left MCA territory cortical/subcortical infarct. A moderate to large confluent infarct is now present within the left temporal and parietal lobes as well as posterior left insula. No significant mass effect at this time. Redemonstrated small cortically based subacute infarcts within the left pre and postcentral gyri. New punctate acute/early subacute infarct within the mid left frontal lobe white matter. A subcentimeter subacute infarct within the right cerebellum was better appreciated on the prior MRI of 08/01/2019. Stable generalized parenchymal atrophy. Small chronic infarcts within the bilateral cerebellar hemispheres  MRA head - Hypoplastic right vertebral artery appears grossly patent on this exam, previously not visualized on recent MRA, and question to be occluded at that time. This was most likely artifactual nature on prior study.   CTA H&N - not ordered  CT Perfusion - not ordered  Carotid Doppler - 08/02/19 - near normal  TEE 08/04/19 - see report above  Lacey Jensen Virus 2 - negative  LDL - 76  HgbA1c  - 5.7  UDS - 08/01/19 - opiates (Rx - chronic pain)  VTE prophylaxis - Heparin IV Diet  Diet Order            Diet Heart Room service appropriate? Yes; Fluid consistency: Thin  Diet effective now                 Eliquis (apixaban) daily prior to admission, now on heparin IV  Patient will be counseled to be compliant with her antithrombotic medications  Ongoing aggressive stroke risk factor management  Therapy recommendations:  pending  Disposition:  Pending  Hypertension  Home BP meds: metoprolol  Current BP meds: metoprolol  Stable . Permissive hypertension (OK if < 220/120) but gradually normalize in 5-7 days . Long-term BP goal normotensive  Hyperlipidemia  Home Lipid lowering medication: Crestor 20 mg daily  LDL 76, goal < 70  Current lipid  lowering medication: Crestor 20 mg daily  Continue statin at discharge  Other Stroke Risk Factors  Advanced age  Former cigarette smoker - quit  Obesity, Body mass index is 51 kg/m., recommend weight loss, diet and exercise as appropriate   Hx stroke/TIA  Coronary artery disease  Congestive Heart Failure  Other Active Problems  Code status - Full code  CHF - lasix  RA - prednisone, leflunomide, and hydroxychloroquine.  Anemia - Hgb - 10.8 CKD - stage 3b - creatinine - 1.42 Aortic Atherosclerosis (ICD10-I70.0) Loop implant 08/05/19 -> interrogate Recent positive blood cultures - ? contaminant    Hospital day # 1  Patient had presented 2 weeks ago with small cryptogenic left MCA branch infarct and was found to have a small PFO as well as a fibroblastoma but was started on Eliquis for anticoagulation and she was found to have DVT. She unfortunately has returned with worsening of aphasia and increased right-sided weakness and MRI shows extension of her stroke with additional new strokes. Exact relationship of papillary fibroelastoma with stroke is unclear as its treatment as antiplatelet therapy as well as  anticoagulation have not been shown to work. Cardiothoracic surgery is being consulted to look at surgical options for fibroblastoma but patient's general medical condition is quite poor to withstand such measures heart surgery. Continue IV heparin for now but may need to consider switching to alternative anticoagulant for her DVT upon discharge. Greater than 50% time during this 25-minute visit was spent on counseling and coordination of care and discussion with care team. Antony Contras, MD  To contact Stroke Continuity provider, please refer to [email or web address] After hours, contact General Neurology

## 2019-08-13 NOTE — Progress Notes (Signed)
Updated daughter, [name], over the phone today including updates in her current status.  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  Daughter expressed understanding and had all questions answered.  Will update her number in the chart, expresses desire to speak with Dr. Ricard Dillon regarding surgery.  [name], DO

## 2019-08-14 DIAGNOSIS — Z515 Encounter for palliative care: Secondary | ICD-10-CM

## 2019-08-14 DIAGNOSIS — R918 Other nonspecific abnormal finding of lung field: Secondary | ICD-10-CM

## 2019-08-14 DIAGNOSIS — I63312 Cerebral infarction due to thrombosis of left middle cerebral artery: Secondary | ICD-10-CM

## 2019-08-14 DIAGNOSIS — J9601 Acute respiratory failure with hypoxia: Secondary | ICD-10-CM

## 2019-08-14 LAB — CBC
HCT: 41.6 % (ref 36.0–46.0)
Hemoglobin: 12.5 g/dL (ref 12.0–15.0)
MCH: 25.1 pg — ABNORMAL LOW (ref 26.0–34.0)
MCHC: 30 g/dL (ref 30.0–36.0)
MCV: 83.4 fL (ref 80.0–100.0)
Platelets: 293 10*3/uL (ref 150–400)
RBC: 4.99 MIL/uL (ref 3.87–5.11)
RDW: 14.3 % (ref 11.5–15.5)
WBC: 8.6 10*3/uL (ref 4.0–10.5)
nRBC: 0 % (ref 0.0–0.2)

## 2019-08-14 LAB — BASIC METABOLIC PANEL
Anion gap: 12 (ref 5–15)
BUN: 29 mg/dL — ABNORMAL HIGH (ref 8–23)
CO2: 28 mmol/L (ref 22–32)
Calcium: 9.6 mg/dL (ref 8.9–10.3)
Chloride: 95 mmol/L — ABNORMAL LOW (ref 98–111)
Creatinine, Ser: 1.62 mg/dL — ABNORMAL HIGH (ref 0.44–1.00)
GFR calc Af Amer: 36 mL/min — ABNORMAL LOW (ref >60)
GFR calc non Af Amer: 31 mL/min — ABNORMAL LOW (ref >60)
Glucose, Bld: 105 mg/dL — ABNORMAL HIGH (ref 70–99)
Potassium: 4.5 mmol/L (ref 3.5–5.1)
Sodium: 135 mmol/L (ref 135–145)

## 2019-08-14 LAB — APTT
aPTT: 117 seconds — ABNORMAL HIGH (ref 24–36)
aPTT: 109 seconds — ABNORMAL HIGH (ref 24–36)
aPTT: 70 seconds — ABNORMAL HIGH (ref 24–36)

## 2019-08-14 LAB — HEPARIN LEVEL (UNFRACTIONATED): Heparin Unfractionated: 1.3 IU/mL — ABNORMAL HIGH (ref 0.30–0.70)

## 2019-08-14 NOTE — Evaluation (Signed)
Occupational Therapy Evaluation Patient Details Name: [name] MRN: [id number] DOB: [birth year] Today's Date: 08/14/2019    History of Present Illness Pt is 74 y.o. female with PMH of HTN, HLD, CAD, anemia, RA, TKR (R), morbid obesity, decreased ROM in L shoulder, Presented to ED with R hand weakness. MRI reveals small acute cortical/subcortical infarct affecting the left pre and post central gyri. Pt discharged on 7/10 to SNF, but was later found to have a positive blood culture for corynebacterium and readmitted on 7/12.   Clinical Impression   Pt PTA: Pt was at SNF for recent CVA. Pt currently limited by decreased strength, decreased activity tolerance, decreased ability to follow commands and increased pain. Pt minA to maxA for ADL; pt maxA +2 for mobility. Pt fatigues easily scooting to [facility] x2 times with assist of bed pad. Pt unable to reposition well in bed with R sided weakness present. Pt appears to have new cognitive deficits and thus, pt requires post acute OT skilled services. OT following acutely.      Follow Up Recommendations  CIR;Supervision/Assistance - 24 hour    Equipment Recommendations  Other (comment) (defer to next facility)    Recommendations for Other Services Rehab consult     Precautions / Restrictions Precautions Precautions: Fall Precaution Comments: watch SpO2 Restrictions Weight Bearing Restrictions: No      Mobility Bed Mobility Overal bed mobility: Needs Assistance Bed Mobility: Supine to Sit;Sit to Supine     Supine to sit: Mod assist;HOB elevated Sit to supine: Max assist;+2 for physical assistance   General bed mobility comments: Pt requiring multimodal verbal cues for sitting EOB and for lying back down.  Pt requiring maxA+2 for BLEs back onto bed.  Transfers                 General transfer comment: deferred as pt unable to stand with momentum; pt scooting to [facility] x2 times before too fatigued.    Balance Overall balance  assessment: Needs assistance Sitting-balance support: Single extremity supported Sitting balance-Leahy Scale: Poor Sitting balance - Comments: reliant on unilateral UE support                                   ADL either performed or assessed with clinical judgement   ADL Overall ADL's : Needs assistance/impaired Eating/Feeding: Minimal assistance;Sitting;Bed level   Grooming: Moderate assistance;Sitting;Bed level   Upper Body Bathing: Maximal assistance;Sitting   Lower Body Bathing: Maximal assistance;Cueing for safety;Sitting/lateral leans;Bed level   Upper Body Dressing : Moderate assistance;Sitting   Lower Body Dressing: Maximal assistance;+2 for physical assistance;Cueing for safety;Sitting/lateral leans;Bed level   Toilet Transfer: Maximal assistance;+2 for physical assistance;+2 for safety/equipment Toilet Transfer Details (indicate cue type and reason): pt scooting towards HOB: pt unable to take steps Toileting- Clothing Manipulation and Hygiene: Maximal assistance;+2 for physical assistance;+2 for safety/equipment;Sitting/lateral lean;Bed level       Functional mobility during ADLs: Maximal assistance;+2 for physical assistance;+2 for safety/equipment General ADL Comments: Pt limited by decreased strength, decreased activity tolerance, decreased ability to follow commands and increased pain.     Vision   Additional Comments: Pt unable to focus on commands or visual assessment; per chart, pt wears glasses.     Perception     Praxis      Pertinent Vitals/Pain Pain Assessment: Faces Pain Score: 0-No pain Pain Intervention(s): Monitored during session     Hand Dominance Right   Extremity/Trunk  Assessment Upper Extremity Assessment Upper Extremity Assessment: Generalized weakness;RUE deficits/detail RUE Deficits / Details: swelling in hand ~3/5 MM grade LUE Deficits / Details: Hx of limited ROM in L shoulder; good grip strength   Lower Extremity  Assessment Lower Extremity Assessment: Generalized weakness;RLE deficits/detail;Defer to PT evaluation RLE Deficits / Details: poor mobility/ROM RLE Coordination: decreased fine motor;decreased gross motor   Cervical / Trunk Assessment Cervical / Trunk Assessment: Other exceptions Cervical / Trunk Exceptions: morbid obesity   Communication Communication Communication: No difficulties   Cognition Arousal/Alertness: Lethargic Behavior During Therapy: Flat affect Overall Cognitive Status: Difficult to assess                                 General Comments: Pt unable to answer any orientation questions - pt just stating "Mhmm."   General Comments  VSS on O2. HR 80s. O2 >90%.    Exercises     Shoulder Instructions      Home Living Family/patient expects to be discharged to:: Skilled nursing facility Living Arrangements: Children Available Help at Discharge: Family;Available PRN/intermittently Type of Home: [facility] Access: Level entry     Home Layout: One level     Bathroom Shower/Tub: Occupational psychologist: Standard     Home Equipment: Grab bars - tub/shower;Walker - 4 wheels          Prior Functioning/Environment Level of Independence: Needs assistance  Gait / Transfers Assistance Needed: pt ambulates with rollator ADL's / Homemaking Assistance Needed: Daughter assists with cooking, cleaning. Pt dresses herself with pullover gowns.    Comments: Pt mainly stays at home watching TV and has transportation to doctor's appointments.         OT Problem List: Decreased strength;Decreased range of motion;Decreased activity tolerance;Impaired balance (sitting and/or standing);Impaired vision/perception;Decreased safety awareness;Decreased coordination;Decreased cognition;Impaired UE functional use;Pain;Increased edema;Decreased knowledge of use of DME or AE;Cardiopulmonary status limiting activity;Obesity      OT  Treatment/Interventions: Self-care/ADL training;Therapeutic exercise;Neuromuscular education;Energy conservation;Therapeutic activities;Visual/perceptual remediation/compensation;Patient/family education;Balance training;Cognitive remediation/compensation    OT Goals(Current goals can be found in the care plan section) Acute Rehab OT Goals Patient Stated Goal: none stated OT Goal Formulation: Patient unable to participate in goal setting Time For Goal Achievement: 08/28/19 Potential to Achieve Goals: Good ADL Goals Pt Will Perform Grooming: with set-up;sitting Pt Will Transfer to Toilet: with max assist;stand pivot transfer;bedside commode Pt/caregiver will Perform Home Exercise Program: Increased strength;Both right and left upper extremity;With written HEP provided Additional ADL Goal #1: Pt will demonstrate selective attention during ADL task in minimally distracting environment with minimal cues to perform task. Additional ADL Goal #2: Pt will increase to minA for overall ADL assist.  OT Frequency: Min 2X/week   Barriers to D/C:            Co-evaluation              AM-PAC OT "6 Clicks" Daily Activity     Outcome Measure Help from another person eating meals?: A Lot Help from another person taking care of personal grooming?: A Lot Help from another person toileting, which includes using toliet, bedpan, or urinal?: A Lot Help from another person bathing (including washing, rinsing, drying)?: A Lot Help from another person to put on and taking off regular upper body clothing?: A Lot Help from another person to put on and taking off regular lower body clothing?: A Lot 6 Click Score: 12  End of Session Equipment Utilized During Treatment: Oxygen Nurse Communication: Mobility status  Activity Tolerance: Patient limited by fatigue;Patient limited by lethargy;Treatment limited secondary to medical complications (Comment) Patient left: in bed;with call bell/phone within  reach;with bed alarm set;Other (comment) (lab in room)  OT Visit Diagnosis: Unsteadiness on feet (R26.81);Muscle weakness (generalized) (M62.81);Other symptoms and signs involving cognitive function Pain - Right/Left: Right Pain - part of body: Arm                Time: 1255-1316 OT Time Calculation (min): 21 min Charges:  OT General Charges $OT Visit: 1 Visit OT Evaluation $OT Eval Moderate Complexity: 1 Mod  Jefferey Pica, OTR/L Acute Rehabilitation Services Pager: [phone] Office: [phone]   LLVDIXV C 08/14/2019, 3:18 PM

## 2019-08-14 NOTE — Social Work (Signed)
CSW consulted for housing resources. CSW contacted patient's daughter [name] to inquire on living situation. [name] stated patient lives in [facility] and they are remodeling. [facility] will assist patient with moving to a 1-bedroom, but that will not allow [name] to assist in patient's care. [name] does not want patient going into a skilled nursing facility. CSW agreed to locate some resources to assist [name] with finding housing that is suitable for both her and patient.   CSW spoke with [name] regarding PT recommendation for CIR and explained insurance authorization process. CSW will continue to assist.  [name], [place] Social Worker

## 2019-08-14 NOTE — Progress Notes (Addendum)
STROKE TEAM PROGRESS NOTE     INTERVAL HISTORY Patient remains aphasic with right hemiparesis with significant right arm weakness.  Right leg weakness seems improved today.  No family at the bedside but plans are for palliative care team to meet with them and discuss goals of care after family has spoken to cardiothoracic surgery    OBJECTIVE Vitals:   08/13/19 2121 08/13/19 2201 08/14/19 0553 08/14/19 0809  BP:  (!) 141/68  (!) 128/50  Pulse:  83  73  Resp:  20  19  Temp: 99.3 F (37.4 C) 100.3 F (37.9 C)  98.3 F (36.8 C)  TempSrc: Oral Oral    SpO2:  96%  100%  Weight:   116.1 kg   Height:        CBC:  Recent Labs  Lab 08/08/19 1625 08/09/19 0247 08/13/19 0420 08/14/19 0400  WBC 7.1   < > 5.2 8.6  NEUTROABS 4.3  --   --   --   HGB 11.3*   < > 12.5 12.5  HCT 38.3   < > 40.8 41.6  MCV 84.7   < > 84.5 83.4  PLT 299   < > PLATELET CLUMPS NOTED ON SMEAR, UNABLE TO ESTIMATE 293   < > = values in this interval not displayed.    Basic Metabolic Panel:  Recent Labs  Lab 08/13/19 0420 08/14/19 0400  NA 137 135  K 5.1 4.5  CL 99 95*  CO2 25 28  GLUCOSE 84 105*  BUN 25* 29*  CREATININE 1.42* 1.62*  CALCIUM 9.4 9.6    Lipid Panel:     Component Value Date/Time   CHOL 121 08/02/2019 0552   TRIG 131 08/02/2019 0552   HDL 19 (L) 08/02/2019 0552   CHOLHDL 6.4 08/02/2019 0552   VLDL 26 08/02/2019 0552   LDLCALC 76 08/02/2019 0552   HgbA1c:  Lab Results  Component Value Date   HGBA1C 5.7 (H) 08/02/2019   Urine Drug Screen:     Component Value Date/Time   LABOPIA POSITIVE (A) 08/01/2019 1518   COCAINSCRNUR NONE DETECTED 08/01/2019 1518   LABBENZ NONE DETECTED 08/01/2019 1518   AMPHETMU NONE DETECTED 08/01/2019 1518   THCU NONE DETECTED 08/01/2019 1518   LABBARB NONE DETECTED 08/01/2019 1518    Alcohol Level     Component Value Date/Time   ETH <10 08/01/2019 1349    IMAGING  MR ANGIO HEAD WO CONTRAST 08/13/2019 IMPRESSION:  1. Technically  limited exam due to severe motion artifact.  2. Grossly negative intracranial MRA. No large vessel occlusion. No appreciable hemodynamically significant or correctable stenosis on this motion degraded exam.  3. 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 This was most likely artifactual nature on prior study.   MR BRAIN WO CONTRAST 08/12/2019 IMPRESSION:  Significantly motion degraded and limited examination as described. Marked interval increase in extent of an acute/early subacute left MCA territory cortical/subcortical infarct. A moderate to large confluent infarct is now present within the left temporal and parietal lobes as well as posterior left insula. No significant mass effect at this time. Redemonstrated small cortically based subacute infarcts within the left pre and postcentral gyri. New punctate acute/early subacute infarct within the mid left frontal lobe white matter. A subcentimeter subacute infarct within the right cerebellum was better appreciated on the prior MRI of 08/01/2019. Stable generalized parenchymal atrophy. Small chronic infarcts within the  bilateral cerebellar hemispheres. Mild ethmoid sinus mucosal thickening. Right mastoid effusion.   DG CHEST PORT 1 VIEW 08/11/2019 IMPRESSION:  Patchy airspace opacity in the lung bases with small right pleural effusion. Stable cardiac prominence. No new opacity evident. Aortic Atherosclerosis (ICD10-I70.0).   Trans Esophageal Echocardiogram  08/04/2019 IMPRESSIONS  1. There is a 1.1 x 0.7 cm mass attached to the P2 segment of the PMVL.  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 There is no  significant regurgitation of the mitral valve or destruction to suggest  this is a vegetation. This is a native valve  so this is unlikely to represent thrombus. Would recommend blood cultures  to ensure there is no  endocarditis. The mitral valve is abnormal. Trivial  mitral valve regurgitation. No evidence of mitral stenosis.  2. Agitated saline contrast bubble study was positive with shunting  observed within 3-6 cardiac cycles suggestive of interatrial shunt. A  moderate size PFO is present with R to L shunting.  3. Left ventricular ejection fraction, by estimation, is 70 to 75%. The  left ventricle has hyperdynamic function. The left ventricle has no  regional wall motion abnormalities. There is the interventricular septum  is flattened in systole and diastole,  consistent with right ventricular pressure and volume overload.  4. Right ventricular systolic function is moderately reduced. The right  ventricular size is severely enlarged.  5. No left atrial/left atrial appendage thrombus was detected. The LAA  emptying velocity was 58 cm/s.  6. Right atrial size was moderately dilated.  7. The tricuspid valve is abnormal. Tricuspid valve regurgitation is  moderate to severe.  8. The aortic valve is tricuspid. Aortic valve regurgitation is mild.  Mild aortic valve sclerosis is present, with no evidence of aortic valve  stenosis.  9. There is Moderate (Grade III) layered plaque involving the transverse  aorta and descending aorta.   Bilateral Carotid Dopplers  08/02/2019 Summary:  Right Carotid: The extracranial vessels were near-normal with only minimal wall thickening or plaque.  Left Carotid: The extracranial vessels were near-normal with only minimal wallthickening or plaque.  Vertebrals: Bilateral vertebral arteries demonstrate antegrade flow.  Subclavians: Normal flow hemodynamics were seen in bilateral subclavian arteries.    ECG - SR rate 82 BPM. (See cardiology reading for complete details)   PHYSICAL EXAM Blood pressure (!) 128/50, pulse 73, temperature 98.3 F (36.8 C), resp. rate 19, height _0  (1.6 m), weight 116.1 kg, SpO2 100 %. Obese elderly African-American lady  not in distress. . Afebrile. Head is nontraumatic. Neck is supple without bruit.    Cardiac exam no murmur or gallop. Lungs are clear to auscultation. Distal pulses are well felt.  Neurological Exam : Awake alert disoriented. Severe aphasia expressive as well as receptive. Able to just speak only occasional short sentences and can follow occasional one-step commands. Unable to name or repeat. She blinks to threat more on the left than on the right. Right lower facial weakness. Tongue midline. Right hemiplegia 2 /5 strength in right upper extremity and 3/5 in the right lower extremity with decreased tone on the right. Good antigravity strength on the left. Sensation cannot reliably tested. Right plantar upgoing left downgoing. Gait not tested    ASSESSMENT/PLAN Ms. JIMMI SIDENER is a 74 y.o. 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 S/P loop implant 08/05/19. Patient returned to [facility] 08/08/19 with acute hypoxemic respiratory failure (CHF) and worsening symptoms of right hemiparesis, right facial droop and aphasia (both receptive and expressive) with extreme difficulty following commands.  She did not receive IV t-PA due to recent stroke.  Stroke: Marked interval increase in extent of an acute/early subacute left MCA territory cortical/subcortical infarct - embolic - source unknown.  Resultant aphasia and right hemiplegia code Stroke CT Head - not ordered  CT head - not ordered  MRI head - Marked interval increase in extent of an acute/early subacute left MCA territory cortical/subcortical infarct. A moderate to large confluent infarct is now present within the left temporal and parietal lobes as well as posterior left insula. No significant mass effect at this time.  Redemonstrated small cortically based subacute infarcts within the left pre and postcentral gyri. New punctate acute/early subacute infarct within the mid left frontal lobe white matter. A subcentimeter subacute infarct within the right cerebellum was better appreciated on the prior MRI of 08/01/2019. Stable generalized parenchymal atrophy. Small chronic infarcts within the bilateral cerebellar hemispheres  MRA head - Hypoplastic right vertebral artery appears grossly patent on this exam, previously not visualized on recent MRA, and question to be occluded at that time. This was most likely artifactual nature on prior study.   CTA H&N - not ordered  CT Perfusion - not ordered  Carotid Doppler - 08/02/19 - near normal  TEE 08/04/19 - see report above  Lacey Jensen Virus 2 - negative  LDL - 76  HgbA1c - 5.7  UDS - 08/01/19 - opiates (Rx - chronic pain)  VTE prophylaxis - Heparin IV Diet  Diet Order            DIET SOFT Room service appropriate? No; Fluid consistency: Thin  Diet effective now                 Eliquis (apixaban) daily prior to admission, now on heparin IV  Patient will be counseled to be compliant with her antithrombotic medications  Ongoing aggressive stroke risk factor management  Therapy recommendations:  pending  Disposition:  Pending  Hypertension  Home BP meds: metoprolol  Current BP meds: metoprolol  Stable . Permissive hypertension (OK if < 220/120) but gradually normalize in 5-7 days . Long-term BP goal normotensive  Hyperlipidemia  Home Lipid lowering medication: Crestor 20 mg daily  LDL 76, goal < 70  Current lipid lowering medication: Crestor 20 mg daily  Continue statin at discharge  Other Stroke Risk Factors  Advanced age  Former cigarette smoker - quit  Obesity, Body mass index is 45.34 kg/m., recommend weight loss, diet and exercise as appropriate   Hx stroke/TIA  Coronary artery disease  Congestive Heart Failure  Other  Active Problems  Code status - Full code  CHF - lasix  RA - prednisone, leflunomide, and hydroxychloroquine.  Anemia - Hgb - 10.8 CKD - stage 3b - creatinine - 1.42->1.62 Aortic Atherosclerosis (ICD10-I70.0) Loop implant 08/05/19 -> interrogate Recent positive blood cultures - ? 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  She unfortunately has returned with worsening of aphasia and increased right-sided weakness and MRI shows extension of her stroke with additional new strokes. Exact relationship of papillary fibroelastoma with stroke is unclear as its treatment as antiplatelet therapy as well as anticoagulation have not been shown to work. Cardiothoracic surgery is being consulted to look at surgical options for fibroblastoma but patient's general medical condition is quite poor to withstand such measures heart surgery. Continue IV heparin for now but may need to consider switching to alternative anticoagulant for her DVT upon discharge.  Appreciate palliative care team help and family will make a final decision after meeting with palliative care team and cardiothoracic surgery.   Antony Contras, MD ADDENDUM : I spoke to daughter over phone and discussed prognosis and goals of care. And answered questions. She plans to meet with palliative care soon and make decision   Antony Contras, MD To contact Stroke Continuity provider, please refer to [email or web address] After hours, contact General Neurology

## 2019-08-14 NOTE — Progress Notes (Signed)
FMTS Attending Daily Note:  Debra Netters MD Personal pager:  [phone] FPTS Service Pager:  [phone]  I have seen and examined this patient and have reviewed their chart. I have discussed this patient with the resident. I agree with the resident's findings, assessment and care plan.  Debra Rio, MD    Family Medicine Teaching Service Daily Progress Note Intern Pager: [phone]  Patient name: [name] Medical record number: [id number] Date of birth: [birth year] Age: 74 y.o. Gender: female  Primary Care Provider: Dixie Dials, MD Consultants: neurology, CVTS Code Status: FULL  Pt Overview and Major Events to Date:  Admitted 7/12.   New CVA 7/16  Assessment and Plan: Debra Marksberry Hooksis a 74 y.o.femalepresenting after a positive result on Blood culture post discharge to a SNF.PMH is significant for CVA,hyperlipidemia, hypertension, anemia, and rheumatoid arthritis.  Acute aphasia with R sided weakness, 2/2 Recurrent Ischemic CVA (7/5, 7/16): Acute. Right upper and lower extremity weakness/paralysis and global aphasia.  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  Others possible source is DVT with PFO.  Neurology and CVTS consulted.  Not a candidate for surgery of the mitral valve.  Patient currently on heparin drip. -Neurology on board, appreciate recommendations -CVTS consulted, patient not a good candidate for surgery. -Heparin GTT, stroke protocol -Frequent neuro checks -PT/OT  -Appreciate speech recommendations and assistance with aphasia -Continue Crestor -Repeat CT head on Monday, likely to restart Eliquis if no hemorrhagic conversion --Can consider palliative discussion in the future, pending clinical improvement  -Call daughter with updates  Acute hypoxic respiratory failure, in setting of cor pulmonale  resolved  On room air. restarted IV diuresis per cardiology ,  can discontinue if  patient develops AKI -Strict I and O's -Daily weights -IV Lasix , reassess daily  Severe RV dysfunction with cor pulmonale  Group III PAH: Chronic. Has been present long-term, however she has not followed up outpatient -Consider pulmonology consult while inpatient, pending disposition for primary concern as above -Oxygen therapy -Attempting diuresis as above (IV Lasix 40)   AKI on CKD II: Improving. Creatinine up to 1.62 from 1.42 yesterday. -Continue to monitor daily and DC Lasix if creatinine continues to rise -Strict I and O's -Monitor BMP  R DVT: Stable. Age-indeterminate DVT found in right peroneal veins on 7/9. -Heparin GTT for above, will transfer back to Eliquis when appropriate  Previous Corynebacterium Bcx contaminant: Resolved.   Patient was discharged to SNF on 7/10 with cultures pending; however they came back positive for corynebacterium, felt to be contaminant.  Repeat culture has shown no growth 4 days.  HTN: Chronic, stable. Slightly elevated today. -Will continue low-dose home metoprolol for now -Monitor BP  Rheumatoid arthritis- stable, chronic Patient takes prednisone 5 mg, leflunomide, and hydroxychloroquine.  - Continue home meds - Protonix  Hyperlipidemia- stable and chronic Patient is post- CVA and has a hx HLD. Patient takes rosuvastatin at home. - Continue home meds  Chronic pain syndrome - Continue home Norco 10-325 TID PRN  Hypothyroidism- Chronic and stable Patient takes levothyroxine. - Continue home medication  FEN/GI:Heart healthy Prophylaxis:Heparin drip  Disposition: Likely SNF when medically stable  Subjective:  Patient able to partially communicate coherently.  She says "yes ma'am" to questions even when the appropriate answer is now.  She can follow directions by raising her left arm when asked and moving her left leg when asked.  Objective: Temp:  [98.3 F (36.8  C)-100.3 F (37.9 C)] 100.3 F (37.9 C) (07/17  2201) Pulse Rate:  [80-84] 83 (07/17 2201) Resp:  [18-20] 20 (07/17 2201) BP: (129-188)/(61-68) 141/68 (07/17 2201) SpO2:  [96 %-100 %] 96 % (07/17 2201) Weight:  [116.1 kg] 116.1 kg (07/18 0553) Physical Exam: General: Awake.  No acute pain Cardiovascular: Regular rate and rhythm, no murmurs Respiratory: Lungs clear to auscultation bilaterally Abdomen: Soft, nontender Neuro: 0/5 strength in RUE, 1/5 in RLE.  4/5 strength in LUE and LLE.  Cannot follow commands to assess cranial nerve status or sensation.  Laboratory: Recent Labs  Lab 08/11/19 0304 08/13/19 0420 08/14/19 0400  WBC 7.1 5.2 8.6  HGB 10.8* 12.5 12.5  HCT 35.9* 40.8 41.6  PLT 303 PLATELET CLUMPS NOTED ON SMEAR, UNABLE TO ESTIMATE 293   Recent Labs  Lab 08/12/19 0200 08/13/19 0420 08/14/19 0400  NA 140 137 135  K 4.2 5.1 4.5  CL 103 99 95*  CO2 _0 BUN 29* 25* 29*  CREATININE 1.57* 1.42* 1.62*  CALCIUM 8.8* 9.4 9.6  PROT  --  7.3  --   BILITOT  --  1.3*  --   ALKPHOS  --  44  --   ALT  --  17  --   AST  --  37  --   GLUCOSE 82 84 105*      Imaging/Diagnostic Tests: CT Angtio Chest PE with Contrast 7/14 IMPRESSION: Stable asymmetric bibasilar consolidation, most in keeping with aspiration or infection in the acute setting. No pulmonary embolism. Morphologic changes in keeping with pulmonary arterial hypertension and elevated right heart pressure. Superimposed extensive coronary artery calcification. Aortic Atherosclerosis (ICD10-I70.0).  Chest 1 View 7/12 IMPRESSION: As on recent CT multifocal airspace opacities which could be due to asymmetric edema and/or infectious etiology.Small right pleural effusion.Significantly motion degraded and limited examination as described.Marked interval increase in extent of an acute/early subacute left MCA territory cortical/subcortical infarct. A moderate to large confluent infarct is now present within the left temporal and parietal lobes as well as  posterior left insula. No significant mass effect at this time.Redemonstrated small cortically based subacute infarcts within the left pre and postcentral gyri. New punctate acute/early subacute infarct within the mid left frontal lobe white matter. A subcentimeter subacute infarct within the right cerebellum was better appreciated on the prior MRI of 08/01/2019. Stable generalized parenchymal atrophy. Small chronic infarcts within the bilateral cerebellar hemispheres. Mild ethmoid sinus mucosal thickening. Right mastoid effusion.   Benay Pike, MD 08/14/2019, 7:28 AM PGY-3, [facility] Intern pager: [phone], text pages welcome

## 2019-08-14 NOTE — Consult Note (Signed)
Consultation Note Date: 08/14/2019   Patient Name: [name]  DOB: 1945-10-19  MRN: [id number]  Age / Sex: 74 y.o., female  PCP: Debra Dials, MD Referring Physician: Leeanne Rio, MD  Reason for Consultation: Establishing goals of care  HPI/Patient Profile: 74 y.o. female  with past medical history of multiple strokes, right sided HF, pulmonary HTN with cor pulmonale, small PFO, rheumatoid arthritis on mexotrexate, chronic pain, morbid obesity who was admitted on 08/08/2019 with right sided weakness and hypoxic respiratory failure (in the 80s on room air).  Work up reveals a moderate to large left MCA stroke as well as other small strokes.  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   She is not a candidate for surgical intervention due to her other co-morbidities.  Of note the patient also has acute renal failure and a degree of acute right sided heart failure.  PMT was consulted for goals of care.  Family has requested a conversation with CT surgery prior to meeting with PMT   Clinical Assessment and Goals of Care:  I have reviewed medical records including EPIC notes, labs and imaging, received report from the bedside RN, examined the patient and then spoke on the phone with her daughter [name] to arrange a time  to discuss diagnosis prognosis, GOC, EOL wishes, disposition and options.  I introduced Palliative Medicine as specialized medical care for people living with serious illness. It focuses on providing relief from the symptoms and stress of a serious illness.   [name] is the patient's daughter (rather than grand daughter) and is her decision making surrogate.  She will be in the hospital later today.  She explains she is currently taking care of 7 children.  I asked her to please have the RN page me when she arrives.  As far as  functional and nutritional status patient is non-ambulatory.  She is on a soft diet and able to drink.  [name] indicated that she does understand what was going on with her mother in terms of multiple strokes.  However I requested we have a more comprehensive conversation about everything that is going on with her and how she would like to proceed in the future.  Questions and concerns were addressed.  The family was encouraged to call with questions or concerns.   Primary Decision Maker:  NEXT OF KIN Daughter [name]    SUMMARY OF RECOMMENDATIONS    PMT will meet with family for a comprehensive discussion once they have had a chance to speak with CT Surgery.  Code Status/Advance Care Planning:  Full    Symptom Management:   Patient is currently comfortable, will defer to primary team.  Palliative Prophylaxis:   Aspiration precautions.  Psycho-social/Spiritual:   Desire for further Chaplaincy support: not yet discussed.  Prognosis:  Likely less than 6 months given she continues to have strokes despite anticoagulation and is not a surgical candidate.    Discharge Planning: To Be Determined  Primary Diagnoses: Present on Admission: . Hypoxia . CVA (cerebral vascular accident) ([place])   I have reviewed the medical record, interviewed the patient and family, and examined the patient. The following aspects are pertinent.  Past Medical History:  Diagnosis Date  . Anemia    takes Ferrous Sulfate daily  . Arthritis   . Chronic bronchitis ([place])    Combivent if needed)  . Colostomy care [facility]) 2004  . Coronary artery disease   . Diverticular disease   . Dizziness   . Dry eyes    uses Restasis daily  . H/O hiatal hernia   . History of blood transfusion    no abnormal reaction noted  . History of colon polyps    benign  . Hyperlipidemia    takes Zocor daily  . Hypertension    takes Metoprolol daily  . Hypothyroidism    takes Synthroid daily  . Nocturia      states only d/t being on Lasix  . Numbness and tingling    left foot  . Peripheral edema    takes Lasix daily  . Rheumatoid arthritis(714.0)    Methotrexate 2 times a week  . Shortness of breath    with exertion   Social History   Socioeconomic History  . Marital status: Single    Spouse name: Not on file  . Number of children: Not on file  . Years of education: Not on file  . Highest education level: Not on file  Occupational History  . Not on file  Tobacco Use  . Smoking status: Former Smoker    Quit date: 10/22/2002    Years since quitting: 16.8  . Smokeless tobacco: Never Used  . Tobacco comment: quit smoking in 2004  Substance and Sexual Activity  . Alcohol use: No    Comment: quit drinking in 2004  . Drug use: No  . Sexual activity: Never    Birth control/protection: Surgical  Other Topics Concern  . Not on file  Social History Narrative  . Not on file   Social Determinants of Health   Financial Resource Strain:   . Difficulty of Paying Living Expenses:   Food Insecurity:   . Worried About Charity fundraiser in the Last Year:   . Arboriculturist in the Last Year:   Transportation Needs:   . Film/video editor (Medical):   Marland Kitchen Lack of Transportation (Non-Medical):   Physical Activity:   . Days of Exercise per Week:   . Minutes of Exercise per Session:   Stress:   . Feeling of Stress :   Social Connections:   . Frequency of Communication with Friends and Family:   . Frequency of Social Gatherings with Friends and Family:   . Attends Religious Services:   . Active Member of Clubs or Organizations:   . Attends Archivist Meetings:   Marland Kitchen Marital Status:    Family History  Problem Relation Age of Onset  . Anesthesia problems Neg Hx   . Hypotension Neg Hx   . Malignant hyperthermia Neg Hx   . Pseudochol deficiency Neg Hx     No Known Allergies    Vital Signs: BP (!) 128/50   Pulse 73   Temp 98.3 F (36.8 C)   Resp 19   Ht _0   (1.6 m)   Wt 116.1 kg   SpO2 100%   BMI 45.34 kg/m  Pain Scale: 0-10 POSS *See Group Information*: S-Acceptable,Sleep, easy to arouse  Pain Score: 0-No pain   SpO2: SpO2: 100 % O2 Device:SpO2: 100 % O2 Flow Rate: .O2 Flow Rate (L/min): 2 L/min    Palliative Assessment/Data: 30%     Time In: 10:00 Time Out: 11:00 Time Total: 60 min. Visit consisted of counseling and education dealing with the complex and emotionally intense issues surrounding the need for palliative care and symptom management in the setting of serious and potentially life-threatening illness. Greater than 50%  of this time was spent counseling and coordinating care related to the above assessment and plan.  Signed by: Florentina Jenny, PA-C Palliative Medicine  Please contact Palliative Medicine Team phone at [phone] for questions and concerns.  For individual provider: See Shea Evans

## 2019-08-14 NOTE — Progress Notes (Signed)
ANTICOAGULATION CONSULT NOTE - Follow-up  Pharmacy Consult for heparin Indication: stroke, DVT 08/05/19  No Known Allergies  Patient Measurements: Height: _0  (160 cm) Weight: 116.1 kg (255 lb 15.3 oz) IBW/kg (Calculated) : 52.4 Heparin Dosing Weight: 85kg  Vital Signs: Temp: 98.3 F (36.8 C) (07/18 0809) BP: 128/50 (07/18 0809) Pulse Rate: 73 (07/18 0809)  Labs: Recent Labs    08/12/19 0200 08/13/19 0420 08/13/19 0702 08/13/19 0702 08/13/19 1644 08/14/19 0400 08/14/19 1314  HGB  --  12.5  --   --   --  12.5  --   HCT  --  40.8  --   --   --  41.6  --   PLT  --  PLATELET CLUMPS NOTED ON SMEAR, UNABLE TO ESTIMATE  --   --   --  293  --   APTT  --   --  32   < > 43* 117* 109*  HEPARINUNFRC  --   --  1.50*  --   --  1.30*  --   CREATININE 1.57* 1.42*  --   --   --  1.62*  --    < > = values in this interval not displayed.    Estimated Creatinine Clearance: 38.6 mL/min (A) (by C-G formula based on SCr of 1.62 mg/dL (H)).   Assessment: 74 year old W starting on heparin for acute CVA (enlargement of infarct at L MCA territory and new infarct in L frontal lobe) while on apixaban. 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 Plan is to hold apixaban, start heparin and resume apixaban if CT head is stable in 3-5 days. Last dose of apixaban given 7/16 at 9:30am.   The most recent aPTT was 109 seconds (supratherapeutic) on gtt at 1250 units/hr. Unable to confirm accuracy of lab draw at this time. Spoke with nurse and patient who could not confirm accuracy of lab draw and could not get in touch with phlebotomy. No bleeding reported per RN.   Goal of Therapy:  Heparin level 0.3- 0.5 units/ml Monitor platelets by anticoagulation protocol: Yes  APTT goal 66-85 sec   Plan:  Ordred a STAT aPTT due to inability to confirm accuracy of most recent aPTT  F/u  this aPTT to adjust heparin level as needed  Monitor for s/sx of bleeding    Cephus Slater, PharmD, MBA Pharmacy Resident [phone] 08/14/2019 3:12 PM

## 2019-08-14 NOTE — Progress Notes (Signed)
ANTICOAGULATION CONSULT NOTE - Follow-up  Pharmacy Consult for heparin Indication: stroke, DVT 08/05/19  No Known Allergies  Patient Measurements: Height: 5' 3" (160 cm) Weight: 116.1 kg (255 lb 15.3 oz) IBW/kg (Calculated) : 52.4 Heparin Dosing Weight: 85kg  Vital Signs: Temp: 99 F (37.2 C) (07/18 1713) BP: 114/48 (07/18 1713) Pulse Rate: 81 (07/18 1713)  Labs: Recent Labs    08/12/19 0200 08/13/19 0420 08/13/19 0702 08/13/19 1644 08/14/19 0400 08/14/19 1314 08/14/19 1514  HGB  --  12.5  --   --  12.5  --   --   HCT  --  40.8  --   --  41.6  --   --   PLT  --  PLATELET CLUMPS NOTED ON SMEAR, UNABLE TO ESTIMATE  --   --  293  --   --   APTT  --   --  32   < > 117* 109* 70*  HEPARINUNFRC  --   --  1.50*  --  1.30*  --   --   CREATININE 1.57* 1.42*  --   --  1.62*  --   --    < > = values in this interval not displayed.    Estimated Creatinine Clearance: 38.6 mL/min (A) (by C-G formula based on SCr of 1.62 mg/dL (H)).   Assessment: 74 year old W starting on heparin for acute CVA (enlargement of infarct at L MCA territory and new infarct in L frontal lobe) while on apixaban. 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 Plan is to hold apixaban, start heparin and resume apixaban if CT head is stable in 3-5 days. Last dose of apixaban given 7/16 at 9:30am.   -aPTT= 70 and at goal   Goal of Therapy:  Heparin level 0.3- 0.5 units/ml Monitor platelets by anticoagulation protocol: Yes  APTT goal 66-85 sec   Plan:  -Continue heparin at 1250 units/hr -aPTT, heparin level and CBC in am  Hildred Laser, PharmD Clinical Pharmacist **Pharmacist phone directory can now be found on [email or web address] ([email or web address]).  Listed under [place].

## 2019-08-14 NOTE — Progress Notes (Signed)
ANTICOAGULATION CONSULT NOTE - Follow-up  Pharmacy Consult for heparin Indication: stroke, DVT 08/05/19  No Known Allergies  Patient Measurements: Height: _0  (160 cm) Weight: 130.6 kg (287 lb 14.7 oz) IBW/kg (Calculated) : 52.4 Heparin Dosing Weight: 85kg  Vital Signs: Temp: 100.3 F (37.9 C) (07/17 2201) Temp Source: Oral (07/17 2201) BP: 141/68 (07/17 2201) Pulse Rate: 83 (07/17 2201)  Labs: Recent Labs    08/12/19 0200 08/13/19 0420 08/13/19 0702 08/13/19 1644 08/14/19 0400  HGB  --  12.5  --   --  12.5  HCT  --  40.8  --   --  41.6  PLT  --  PLATELET CLUMPS NOTED ON SMEAR, UNABLE TO ESTIMATE  --   --  293  APTT  --   --  32 43* 117*  HEPARINUNFRC  --   --  1.50*  --  1.30*  CREATININE 1.57* 1.42*  --   --  1.62*    Estimated Creatinine Clearance: 41.5 mL/min (A) (by C-G formula based on SCr of 1.62 mg/dL (H)).   Assessment: 74 year old W starting on heparin for acute CVA (enlargement of infarct at L MCA territory and new infarct in L frontal lobe) while on apixaban. 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 Plan is to hold apixaban, start heparin and resume apixaban if CT head is stable in 3-5 days. Last dose of apixaban given 7/16 at 9:30am.   PTT 117 seconds (supratherapeutic) on gtt at 1450 units/hr. Heparin level 1.3 (affected by Eliquis). No issues with line or bleeding reported per RN.  Goal of Therapy:  Heparin level 0.3- 0.5 units/ml Monitor platelets by anticoagulation protocol: Yes  APTT goal 66-85 sec   Plan:  Decrease heparin drip to 1250 units/hr Will f/u 8hr PTT   Sherlon Handing, PharmD, BCPS Please see amion for complete clinical pharmacist phone list 08/14/2019 5:36 AM

## 2019-08-14 NOTE — Progress Notes (Signed)
[name] daughter [name] arrived at the hospital and was kind enough to sit and talk with me for a while.  I explained that due to her heart and medical problems her mother is not a candidate for surgery.  Unfortunately without surgery we do not have a way of preventing strokes from continuing to happen.    Her mother is a candidate for long term hospice services in the home or nursing facility.    [name] expressed that she would rather not send her mother to a SNF but they are having difficulties with their living situation right now.  The residents of their building are being forced to relocate.  [name] is trying to find a larger home where she can care for her mother.     We overviewed the MOST form and I asked [name] to consider which options were appropriate for her mother.  [name] was very pleasant but tearful.    PMT will follow up tomorrow.  Recommend involvement of social work - I believe [name] would like to take her mother home with the support of her family and hospice, but I'm concerned their current living situation is temporary.   Patient may need SNF if she does not have a home to go to right now.  Florentina Jenny, PA-C Palliative Medicine Office:  [phone]

## 2019-08-14 NOTE — Progress Notes (Signed)
Rehab Admissions Coordinator Note:  Patient was screened by Cleatrice Burke for appropriateness for an Inpatient Acute Rehab Consult per OT recs.   At this time, we are recommending Inpatient Rehab consult if not moving to palliative.  Cleatrice Burke RN MSN 08/14/2019, 9:06 PM  I can be reached at [phone].

## 2019-08-15 DIAGNOSIS — I63512 Cerebral infarction due to unspecified occlusion or stenosis of left middle cerebral artery: Secondary | ICD-10-CM

## 2019-08-15 LAB — HEPARIN LEVEL (UNFRACTIONATED): Heparin Unfractionated: 0.9 IU/mL — ABNORMAL HIGH (ref 0.30–0.70)

## 2019-08-15 LAB — CBC
HCT: 39.7 % (ref 36.0–46.0)
Hemoglobin: 11.8 g/dL — ABNORMAL LOW (ref 12.0–15.0)
MCH: 25.2 pg — ABNORMAL LOW (ref 26.0–34.0)
MCHC: 29.7 g/dL — ABNORMAL LOW (ref 30.0–36.0)
MCV: 84.8 fL (ref 80.0–100.0)
Platelets: 286 10*3/uL (ref 150–400)
RBC: 4.68 MIL/uL (ref 3.87–5.11)
RDW: 14.5 % (ref 11.5–15.5)
WBC: 8.6 10*3/uL (ref 4.0–10.5)
nRBC: 0 % (ref 0.0–0.2)

## 2019-08-15 LAB — BASIC METABOLIC PANEL
Anion gap: 13 (ref 5–15)
BUN: 54 mg/dL — ABNORMAL HIGH (ref 8–23)
CO2: 26 mmol/L (ref 22–32)
Calcium: 9.3 mg/dL (ref 8.9–10.3)
Chloride: 94 mmol/L — ABNORMAL LOW (ref 98–111)
Creatinine, Ser: 2.36 mg/dL — ABNORMAL HIGH (ref 0.44–1.00)
GFR calc Af Amer: 23 mL/min — ABNORMAL LOW (ref >60)
GFR calc non Af Amer: 20 mL/min — ABNORMAL LOW (ref >60)
Glucose, Bld: 114 mg/dL — ABNORMAL HIGH (ref 70–99)
Potassium: 3.9 mmol/L (ref 3.5–5.1)
Sodium: 133 mmol/L — ABNORMAL LOW (ref 135–145)

## 2019-08-15 LAB — APTT: aPTT: 115 seconds — ABNORMAL HIGH (ref 24–36)

## 2019-08-15 MED ORDER — APIXABAN 5 MG PO TABS
5.0000 mg | ORAL_TABLET | Freq: Two times a day (BID) | ORAL | Status: DC
  Administered 2019-08-15 – 2019-08-23 (×18): 5 mg via ORAL
  Filled 2019-08-15 (×18): qty 1

## 2019-08-15 MED ORDER — SODIUM CHLORIDE 0.9 % IV BOLUS
500.0000 mL | Freq: Once | INTRAVENOUS | Status: AC
  Administered 2019-08-15: 500 mL via INTRAVENOUS

## 2019-08-15 NOTE — Progress Notes (Signed)
ANTICOAGULATION CONSULT NOTE - Follow-up  Pharmacy Consult for heparin (team switched to apixaban) Indication: stroke, DVT 08/05/19  No Known Allergies  Patient Measurements: Height: _0  (160 cm) Weight: 119.2 kg (262 lb 12.6 oz) IBW/kg (Calculated) : 52.4 Heparin Dosing Weight: 85kg  Vital Signs: Temp: 98.1 F (36.7 C) (07/19 0755) Temp Source: Oral (07/19 0755) BP: 113/46 (07/19 0755) Pulse Rate: 71 (07/19 0755)  Labs: Recent Labs     0000 08/13/19 0420 08/13/19 0702 08/13/19 1644 08/14/19 0400 08/14/19 0400 08/14/19 1314 08/14/19 1514 08/15/19 0954  HGB   < > 12.5  --   --  12.5  --   --   --  11.8*  HCT  --  40.8  --   --  41.6  --   --   --  39.7  PLT  --  PLATELET CLUMPS NOTED ON SMEAR, UNABLE TO ESTIMATE  --   --  293  --   --   --  286  APTT  --   --  32   < > 117*   < > 109* 70* 115*  HEPARINUNFRC  --   --  1.50*  --  1.30*  --   --   --  0.90*  CREATININE  --  1.42*  --   --  1.62*  --   --   --  2.36*   < > = values in this interval not displayed.    Estimated Creatinine Clearance: 26.9 mL/min (A) (by C-G formula based on SCr of 2.36 mg/dL (H)).   Assessment: 74 year old W starting on heparin for acute CVA (enlargement of infarct at L MCA territory and new infarct in L frontal lobe) while on apixaban. 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 Plan is to hold apixaban, start heparin and resume apixaban if CT head is stable in 3-5 days. Last dose of apixaban given 7/16 at 9:30am.   HL and APTT drawn late this morning and both resulted supratherapeutic. Phlebotomist unsure where she drew the level from relative to the heparin line, RN does not know and patient has difficult communication after CVA. No bleeding. H/H, plt stable. HL is trending down as expected now 3 days after last apixaban dose. APTT elevated as was yesterday morning  but repeat level in the evening was at goal without a dose change.    Spoke with Neurology, who agreed with switching back to apixaban.   Goal of Therapy:  APTT goal 66-85 sec Heparin level 0.3- 0.5 units/ml Monitor platelets by anticoagulation protocol: Yes     Plan:  Stop heparin Start apixaban 85m BID   LBenetta Spar PharmD, BCPS, BOrlando Health Dr P Phillips HospitalClinical Pharmacist  Please check AMION for all [place]phone numbers After 10:00 PM, call [phone]

## 2019-08-15 NOTE — Plan of Care (Signed)

## 2019-08-15 NOTE — Progress Notes (Signed)
Physical Therapy Treatment Patient Details Name: [name] MRN: [id number] DOB: [birth year] Today's Date: 08/15/2019    History of Present Illness Pt is 74 y.o. female with PMH of HTN, HLD, CAD, anemia, RA, TKR (R), morbid obesity, decreased ROM in L shoulder, Presented to ED with R hand weakness. MRI reveals small acute cortical/subcortical infarct affecting the left pre and post central gyri. Pt discharged on 7/10 to SNF, but was later found to have a positive blood culture for corynebacterium and readmitted on 7/12.    PT Comments    Patient received in bed, pleasant and much more alert today, willing to participate in therapy but still with some expressive difficulties and seeming to also have receptive issues in terms of following commands as well. Able to get to EOB with MinAx2 but Needed MinA to maintain sitting balance; tolerated sit to stand transfers in the stedy with ModAx2 but needed Min-ModA to maintain midline balance due to fatigue and did become dizzy. Bed soaked in urine and we changed the sheets/placed fresh bed pad as well. Left in bed in care of RN with all needs met, bed alarm active this morning. Able to progress well today.     Follow Up Recommendations  CIR;Supervision/Assistance - 24 hour     Equipment Recommendations  Rolling walker with 5" wheels    Recommendations for Other Services       Precautions / Restrictions Precautions Precautions: Fall Precaution Comments: watch SpO2 Restrictions Weight Bearing Restrictions: No    Mobility  Bed Mobility Overal bed mobility: Needs Assistance Bed Mobility: Supine to Sit;Sit to Supine     Supine to sit: Min assist;+2 for physical assistance Sit to supine: Max assist;+2 for physical assistance   General bed mobility comments: +2 assist for safety, line management and balance; able to sit at midilne with MinA to maintain midline and upright  Transfers Overall transfer level: Needs assistance Equipment used:  Ambulation equipment used Transfers: Sit to/from Stand Sit to Stand: Mod assist;+2 physical assistance         General transfer comment: ModAx2 in stedy; min-modA to maintain standing and sitting balance on stedy pads  Ambulation/Gait             General Gait Details: deferred- fatigue and dizziness   Stairs             Wheelchair Mobility    Modified Rankin (Stroke Patients Only)       Balance Overall balance assessment: Needs assistance Sitting-balance support: Single extremity supported Sitting balance-Leahy Scale: Fair Sitting balance - Comments: MinA to maintain upright sitting   Standing balance support: Bilateral upper extremity supported Standing balance-Leahy Scale: Poor Standing balance comment: reliant on UE support and Min-ModA for balance                            Cognition Arousal/Alertness: Awake/alert Behavior During Therapy: Flat affect Overall Cognitive Status: Impaired/Different from baseline Area of Impairment: Orientation;Attention;Memory;Following commands;Safety/judgement;Awareness;Problem solving                   Current Attention Level: Sustained Memory: Decreased recall of precautions;Decreased short-term memory Following Commands: Follows one step commands consistently;Follows one step commands inconsistently Safety/Judgement: Decreased awareness of safety;Decreased awareness of deficits Awareness: Intellectual Problem Solving: Slow processing;Decreased initiation;Difficulty sequencing;Requires verbal cues;Requires tactile cues General Comments: slow processing and need for multimodal cues to maintain safety poor awareness of safety and overall deficits  Exercises      General Comments        Pertinent Vitals/Pain Pain Assessment: Faces Pain Score: 0-No pain Faces Pain Scale: No hurt Pain Intervention(s): Limited activity within patient's tolerance;Monitored during session    Home Living                       Prior Function            PT Goals (current goals can now be found in the care plan section) Acute Rehab PT Goals Patient Stated Goal: none stated PT Goal Formulation: With patient Time For Goal Achievement: 08/24/19 Potential to Achieve Goals: Good Progress towards PT goals: Progressing toward goals    Frequency    Min 3X/week      PT Plan Discharge plan needs to be updated    Co-evaluation              AM-PAC PT "6 Clicks" Mobility   Outcome Measure  Help needed turning from your back to your side while in a flat bed without using bedrails?: A Little Help needed moving from lying on your back to sitting on the side of a flat bed without using bedrails?: A Little Help needed moving to and from a bed to a chair (including a wheelchair)?: A Lot Help needed standing up from a chair using your arms (e.g., wheelchair or bedside chair)?: A Lot Help needed to walk in hospital room?: A Lot Help needed climbing 3-5 steps with a railing? : Total 6 Click Score: 13    End of Session Equipment Utilized During Treatment: Oxygen Activity Tolerance: Patient tolerated treatment well;Patient limited by fatigue Patient left: in bed;with call bell/phone within reach;with bed alarm set;with nursing/sitter in room Nurse Communication: Mobility status PT Visit Diagnosis: Other abnormalities of gait and mobility (R26.89);Muscle weakness (generalized) (M62.81) Hemiplegia - Right/Left: Right Hemiplegia - dominant/non-dominant: Dominant Hemiplegia - caused by: Cerebral infarction     Time: 1045-1105 PT Time Calculation (min) (ACUTE ONLY): 20 min  Charges:  $Therapeutic Activity: 8-22 mins                     Windell Norfolk, DPT, PN1   Supplemental Physical Therapist [place]    Pager [phone] Acute Rehab Office [phone]

## 2019-08-15 NOTE — Progress Notes (Signed)
Family Medicine Teaching Service Daily Progress Note Intern Pager: [phone]  Patient name: [name] Medical record number: [id number] Date of birth: 26-Jan-1946 Age: 74 y.o. Gender: female  Primary Care Provider: Dixie Dials, MD Consultants: Neurology and CVTS Code Status: Full  Pt Overview and Major Events to Date:  Admitted 7/12.   New CVA 7/16  Assessment and Plan: Debra Maggi Hooksis a 74 y.o.femalepresenting after a positive result on Blood culture post discharge to a SNF.PMH is significant for CVA,hyperlipidemia, hypertension, anemia, and rheumatoid arthritis.  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 Right upper and lower extremity weakness/paralysis and global aphasia. Patient is able to understand most simple commands. 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  Others possible source is DVT with PFO.  Neurology and CVTS consulted.  Not a candidate for surgery of the mitral valve.   -Neurology on board, appreciate recommendations -CVTS consulted, patient not a good candidate for surgery. -Discontinue Heparin gtt and restart Eliquis, per neuro no CT Head required -Frequent neuro checks -PT/OT -Appreciate speech recommendations and assistance with aphasia -Continue Crestor -- Palliative has initiated conversation with the patient's daughter -Call daughter with updates  Acute hypoxic respiratory failure, in setting of cor pulmonale resolved On room air most hours. Did use 1L [place] overnight. Cr 54 continued upward trend until 7/18, suggesting Lasix causing AKI. 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 Has been present long-term, however she has not followed up outpatient -Consider pulmonology consult while inpatient, pending disposition for primary concern as above -Oxygen  therapy PRN   AKIon CKD EV:[id number]. Creatinine up to 2.36 from 1.62. -Continue to monitor daily  -Strict I and O's -Monitor BMP - 500cc NS bolus for AKI,  - discontinue lasix  RDVT: Stable. Age-indeterminate DVT found in right peroneal veins on 7/9. -Heparin GTT for above, will transfer back to Eliquis when appropriate  Previous CorynebacteriumBcxcontaminant: Resolved. Patient was discharged to SNFon 7/10with cultures pending; however they came back positive for corynebacterium,felt to be contaminant. Repeat culture has shown no growth4days.  WEX:HBZJIRC, stable. Slightly elevated today. -Will continue low-dose home metoprolol for now -Monitor BP  Rheumatoid arthritis- stable, chronic Patient takes prednisone5 mg, leflunomide, and hydroxychloroquine.  - Continue home meds - Protonix  Hyperlipidemia- stable and chronic Patient is post- CVA and has a hx HLD. Patient takes rosuvastatin at home. - Continue home meds  Chronic pain syndrome - Continue homeNorco 10-325TID PRN  Hypothyroidism- Chronic and stable Patient takes levothyroxine. - Continue home medication  FEN/GI: Soft PPx: Heparin drip  Disposition: Inpatient Rehab?  Subjective:  Patient's daughter has talked with SW and palliative. The patient was also screened by Rehab admissions. At this time the patient has a few options for disposition. Palliative recommends long term hospice in the home or a nursing facility. The patient's daughter does not want her to go into a SNF long term and is looking for a bigger house, in order to take care of her mother and 7 children she is currently caring for. Palliative care is unsure that patient would be able to go directly home if the patient's daughter is looking for a place. The patient was also reviews and approved for inpatient rehab.  Patient had no overnight events.  Due to her global aphasia s/p CVA she is unable to express her self verbally  beyond yes and  no. She does not report pain.   Objective: Temp:  [98.3 F (36.8 C)-99 F (37.2 C)] 98.7 F (37.1 C) (07/18 2109) Pulse Rate:  [73-84] 84 (07/18 2109) Resp:  [14-19] 18 (07/18 2109) BP: (114-128)/(47-50) 115/47 (07/18 2109) SpO2:  [97 %-100 %] 97 % (07/18 2109) Weight:  [119.2 kg] 119.2 kg (07/19 0300) Physical Exam: General: Patient resting in bed with food next to her. Cardiovascular: Regular rate and rhythm, no murmur Respiratory: Crackles in the LLL all other lobes clear and equal otherwise Abdomen: Normoactive bowel sounds, no pain to palpation Extremities: Non-pitting edema, too swollen to feel distal extremities. No CN deficits. 4/5 left hand grip strength, able to lift both arms at the shoulder and elbow. Some difficuulty assessing strength due to not understanding instructions. 1/5 right hand grip strength, BLE 4/5 Laboratory: Recent Labs  Lab 08/11/19 0304 08/13/19 0420 08/14/19 0400  WBC 7.1 5.2 8.6  HGB 10.8* 12.5 12.5  HCT 35.9* 40.8 41.6  PLT 303 PLATELET CLUMPS NOTED ON SMEAR, UNABLE TO ESTIMATE 293   Recent Labs  Lab 08/12/19 0200 08/13/19 0420 08/14/19 0400  NA 140 137 135  K 4.2 5.1 4.5  CL 103 99 95*  CO2 _0 BUN 29* 25* 29*  CREATININE 1.57* 1.42* 1.62*  CALCIUM 8.8* 9.4 9.6  PROT  --  7.3  --   BILITOT  --  1.3*  --   ALKPHOS  --  44  --   ALT  --  17  --   AST  --  37  --   GLUCOSE 82 84 105*      Imaging/Diagnostic Tests: None new  Freida Busman, MD 08/15/2019, 6:01 AM PGY-1, [facility] Intern pager: [phone], text pages welcome

## 2019-08-15 NOTE — Progress Notes (Signed)
STROKE TEAM PROGRESS NOTE     INTERVAL HISTORY Patient Is doing better with aphasia more improved today  Right leg weakness seems improved today.  No family at the bedside but plans are for palliative care team to meet with them and discuss goals of care are pending.  Vital signs stable.   OBJECTIVE Vitals:   08/14/19 1713 08/14/19 2109 08/15/19 0300 08/15/19 0755  BP: (!) 114/48 (!) 115/47  (!) 113/46  Pulse: 81 84  71  Resp: _0 Temp: 99 F (37.2 C) 98.7 F (37.1 C)  98.1 F (36.7 C)  TempSrc:  Tympanic  Oral  SpO2: 100% 97%  95%  Weight:   119.2 kg   Height:        CBC:  Recent Labs  Lab 08/08/19 1625 08/09/19 0247 08/14/19 0400 08/15/19 0954  WBC 7.1   < > 8.6 8.6  NEUTROABS 4.3  --   --   --   HGB 11.3*   < > 12.5 11.8*  HCT 38.3   < > 41.6 39.7  MCV 84.7   < > 83.4 84.8  PLT 299   < > 293 286   < > = values in this interval not displayed.    Basic Metabolic Panel:  Recent Labs  Lab 08/14/19 0400 08/15/19 0954  NA 135 133*  K 4.5 3.9  CL 95* 94*  CO2 28 26  GLUCOSE 105* 114*  BUN 29* 54*  CREATININE 1.62* 2.36*  CALCIUM 9.6 9.3    Lipid Panel:     Component Value Date/Time   CHOL 121 08/02/2019 0552   TRIG 131 08/02/2019 0552   HDL 19 (L) 08/02/2019 0552   CHOLHDL 6.4 08/02/2019 0552   VLDL 26 08/02/2019 0552   LDLCALC 76 08/02/2019 0552   HgbA1c:  Lab Results  Component Value Date   HGBA1C 5.7 (H) 08/02/2019   Urine Drug Screen:     Component Value Date/Time   LABOPIA POSITIVE (A) 08/01/2019 1518   COCAINSCRNUR NONE DETECTED 08/01/2019 1518   LABBENZ NONE DETECTED 08/01/2019 1518   AMPHETMU NONE DETECTED 08/01/2019 1518   THCU NONE DETECTED 08/01/2019 1518   LABBARB NONE DETECTED 08/01/2019 1518    Alcohol Level     Component Value Date/Time   ETH <10 08/01/2019 1349    IMAGING  MR ANGIO HEAD WO CONTRAST 08/13/2019 IMPRESSION:  1. Technically limited exam due to severe motion artifact.  2. Grossly negative  intracranial MRA. No large vessel occlusion. No appreciable hemodynamically significant or correctable stenosis on this motion degraded exam.  3. 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 This was most likely artifactual nature on prior study.   MR BRAIN WO CONTRAST 08/12/2019 IMPRESSION:  Significantly motion degraded and limited examination as described. Marked interval increase in extent of an acute/early subacute left MCA territory cortical/subcortical infarct. A moderate to large confluent infarct is now present within the left temporal and parietal lobes as well as posterior left insula. No significant mass effect at this time. Redemonstrated small cortically based subacute infarcts within the left pre and postcentral gyri. New punctate acute/early subacute infarct within the mid left frontal lobe white matter. A subcentimeter subacute infarct within the right cerebellum was better appreciated on the prior MRI of 08/01/2019. Stable generalized parenchymal atrophy. Small chronic infarcts within the bilateral cerebellar hemispheres. Mild ethmoid sinus mucosal thickening. Right mastoid  effusion.   DG CHEST PORT 1 VIEW 08/11/2019 IMPRESSION:  Patchy airspace opacity in the lung bases with small right pleural effusion. Stable cardiac prominence. No new opacity evident. Aortic Atherosclerosis (ICD10-I70.0).   Trans Esophageal Echocardiogram  08/04/2019 IMPRESSIONS  1. There is a 1.1 x 0.7 cm mass attached to the P2 segment of the PMVL.  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 There is no  significant regurgitation of the mitral valve or destruction to suggest  this is a vegetation. This is a native valve  so this is unlikely to represent thrombus. Would recommend blood cultures  to ensure there is no endocarditis. The mitral valve is abnormal. Trivial  mitral valve  regurgitation. No evidence of mitral stenosis.  2. Agitated saline contrast bubble study was positive with shunting  observed within 3-6 cardiac cycles suggestive of interatrial shunt. A  moderate size PFO is present with R to L shunting.  3. Left ventricular ejection fraction, by estimation, is 70 to 75%. The  left ventricle has hyperdynamic function. The left ventricle has no  regional wall motion abnormalities. There is the interventricular septum  is flattened in systole and diastole,  consistent with right ventricular pressure and volume overload.  4. Right ventricular systolic function is moderately reduced. The right  ventricular size is severely enlarged.  5. No left atrial/left atrial appendage thrombus was detected. The LAA  emptying velocity was 58 cm/s.  6. Right atrial size was moderately dilated.  7. The tricuspid valve is abnormal. Tricuspid valve regurgitation is  moderate to severe.  8. The aortic valve is tricuspid. Aortic valve regurgitation is mild.  Mild aortic valve sclerosis is present, with no evidence of aortic valve  stenosis.  9. There is Moderate (Grade III) layered plaque involving the transverse  aorta and descending aorta.   Bilateral Carotid Dopplers  08/02/2019 Summary:  Right Carotid: The extracranial vessels were near-normal with only minimal wall thickening or plaque.  Left Carotid: The extracranial vessels were near-normal with only minimal wallthickening or plaque.  Vertebrals: Bilateral vertebral arteries demonstrate antegrade flow.  Subclavians: Normal flow hemodynamics were seen in bilateral subclavian arteries.    ECG - SR rate 82 BPM. (See cardiology reading for complete details)   PHYSICAL EXAM Blood pressure (!) 113/46, pulse 71, temperature 98.1 F (36.7 C), temperature source Oral, resp. rate 16, height _0  (1.6 m), weight 119.2 kg, SpO2 95 %. Obese elderly African-American lady not in distress. . Afebrile. Head is  nontraumatic. Neck is supple without bruit.    Cardiac exam no murmur or gallop. Lungs are clear to auscultation. Distal pulses are well felt.  Neurological Exam : Awake alert she has nonfluent aphasia can speak occasional words and short sentences.  She is able to repeat well but unable to name.  Comprehension is poor.  Follows simple midline and one-step commands only.  She blinks to threat more on the left than on the right. Right lower facial weakness. Tongue midline. Right hemiplegia 3/5 strength in right upper extremity and 3/5 in the right lower extremity with decreased tone on the right. Good antigravity strength on the left. Sensation cannot reliably tested. Right plantar upgoing left downgoing. Gait not tested    ASSESSMENT/PLAN [name] is a 74 y.o. female with history of hypertension, hypothyroidism, chronic pain, hyperlipidemia, RA, CAD, recent embolic stroke 07/29/69 (Rt sided weakness / numbness), found to have a DVT (Eliquis), positive small PFO on TEE as well as a mass - ? Fibroblastoma  on the posterior mitral valve leaflet. S/P loop implant 08/05/19. Patient [facility]/08[facility]19 with acute hypoxemic respiratory failure (CHF) and worsening symptoms of right hemiparesis, right facial droop and aphasia (both receptive and expressive) with extreme difficulty following commands.  She did not receive IV t-PA due to recent stroke.  Stroke: Marked interval increase in extent of an acute/early subacute left MCA territory cortical/subcortical infarct - embolic - source unknown.  Resultant aphasia and right hemiplegia code Stroke CT Head - not ordered  CT head - not ordered  MRI head - Marked interval increase in extent of an acute/early subacute left MCA territory cortical/subcortical infarct. A moderate to large confluent infarct is now present within the left temporal and parietal lobes as well as posterior left insula. No significant mass effect at this time. Redemonstrated small  cortically based subacute infarcts within the left pre and postcentral gyri. New punctate acute/early subacute infarct within the mid left frontal lobe white matter. A subcentimeter subacute infarct within the right cerebellum was better appreciated on the prior MRI of 08/01/2019. Stable generalized parenchymal atrophy. Small chronic infarcts within the bilateral cerebellar hemispheres  MRA head - Hypoplastic right vertebral artery appears grossly patent on this exam, previously not visualized on recent MRA, and question to be occluded at that time. This was most likely artifactual nature on prior study.   CTA H&N - not ordered  CT Perfusion - not ordered  Carotid Doppler - 08/02/19 - near normal  TEE 08/04/19 - see report above  Lacey Jensen Virus 2 - negative  LDL - 76  HgbA1c - 5.7  UDS - 08/01/19 - opiates (Rx - chronic pain)  VTE prophylaxis - Heparin IV Diet  Diet Order            DIET SOFT Room service appropriate? No; Fluid consistency: Thin  Diet effective now                 Eliquis (apixaban) daily prior to admission, now on heparin IV  Patient will be counseled to be compliant with her antithrombotic medications  Ongoing aggressive stroke risk factor management  Therapy recommendations:  pending  Disposition:  Pending  Hypertension  Home BP meds: metoprolol  Current BP meds: metoprolol  Stable . Permissive hypertension (OK if < 220/120) but gradually normalize in 5-7 days . Long-term BP goal normotensive  Hyperlipidemia  Home Lipid lowering medication: Crestor 20 mg daily  LDL 76, goal < 70  Current lipid lowering medication: Crestor 20 mg daily  Continue statin at discharge  Other Stroke Risk Factors  Advanced age  Former cigarette smoker - quit  Obesity, Body mass index is 46.55 kg/m., recommend weight loss, diet and exercise as appropriate   Hx stroke/TIA  Coronary artery disease  Congestive Heart Failure  Other Active  Problems  Code status - Full code  CHF - lasix  RA - prednisone, leflunomide, and hydroxychloroquine.  Anemia - Hgb - 10.8 CKD - stage 3b - creatinine - 1.42->1.62 Aortic Atherosclerosis (ICD10-I70.0) Loop implant 08/05/19 -> interrogate Recent positive blood cultures - ? 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  She unfortunately has returned with worsening of aphasia and increased right-sided weakness and MRI shows extension of her stroke with additional new strokes. Exact relationship of papillary fibroelastoma with stroke is unclear as its treatment as antiplatelet therapy as well as anticoagulation have not been shown to work. Cardiothoracic surgery is being consulted to look at surgical options for fibroblastoma but patient's general medical condition is quite poor to withstand such measures heart surgery.  Change IV heparin to Eliquis.  I spoke to patient daughter yesterday and she would like to wait a few days and decide on goals of care and is agreeable to meet with.    palliative care team and decide on goals of care. Antony Contras, MD    Antony Contras, MD To contact Stroke Continuity provider, please refer to [email or web address] After hours, contact General Neurology

## 2019-08-15 NOTE — Consult Note (Signed)
Physical Medicine and Rehabilitation Consult   Reason for Consult: Stroke with functional deficits Referring Physician: Dr. Candie Chroman   HPI: [name] is a 74 y.o. LH-female with history of CAD, HTN, RA, chronic bronchitis, anemia, morbid obesity-BMI 35, DOE; who was recently admitted on 08/01/19 with difficulty moving right arm with decreased sensation. Work revealed [employer] and R-cerebellar strokes with suspicion of A fib, severe PAH, moderate PFO and age indeterminate DVT in peroneal vein therefore placed on Eliquis with recommendations to start ASA in 3 months post treatment of DVT.  Loop recorder placed and she was discharged to SNF on 07/10. She was readmitted on 07/12 with hypoxia due to acute on chronic CHF. Follow up dopplers were negative for DVT and positive BC from prior admission felt to be due to contamination.    She was treated with diuresis with plans for d/c on 07/16 but developed neurological changes with right facial droop, confusion and RUE>RLE weakness. MRI brain done revealing marked interval increase in acute/early subacute L-MCA territory infarct within left temporal and parietal lobes, new early acute/subacute infarcts in mid left frontal white matter and MRA without LVO , hypoplastic R-VA grossly patent and no correctable stenosis.  She was started on IV heparin and CVTS consulted for input on treatment of MV fibroelastoma due to concerns of source of embolic strokes. Dr. Roxy Manns felt that patient not a candidate for surgical intervention due to multiple commodities and Palliative care consulted to help determine [place]. Therapy evaluations completed yesterday and CIR recommended due to significant functional decline.    Review of Systems  Unable to perform ROS: Language     Past Medical History:  Diagnosis Date  . Anemia    takes Ferrous Sulfate daily  . Arthritis   . Chronic bronchitis ([place])    Combivent if needed)  . Colostomy care [facility]) 2004  . Coronary  artery disease   . Diverticular disease   . Dizziness   . Dry eyes    uses Restasis daily  . H/O hiatal hernia   . History of blood transfusion    no abnormal reaction noted  . History of colon polyps    benign  . Hyperlipidemia    takes Zocor daily  . Hypertension    takes Metoprolol daily  . Hypothyroidism    takes Synthroid daily  . Nocturia    states only d/t being on Lasix  . Numbness and tingling    left foot  . Peripheral edema    takes Lasix daily  . Rheumatoid arthritis(714.0)    Methotrexate 2 times a week  . Shortness of breath    with exertion    Past Surgical History:  Procedure Laterality Date  . ABDOMINAL HYSTERECTOMY  1965  . CAPSULOTOMY Bilateral 06/01/2012   Procedure: MINOR CAPSULOTOMY;  Surgeon: Myrtha Mantis., MD;  Location: [place];  Service: Ophthalmology;  Laterality: Bilateral;  . CATARACT EXTRACTION W/PHACO  01/01/2011   Procedure: CATARACT EXTRACTION PHACO AND INTRAOCULAR LENS PLACEMENT (IOC);  Surgeon: [name], MD;  Location: [place];  Service: Ophthalmology;  Laterality: Left;  . CATARACT EXTRACTION W/PHACO  12/24/2011   Procedure: CATARACT EXTRACTION PHACO AND INTRAOCULAR LENS PLACEMENT (IOC);  Surgeon: [name], MD;  Location: [place];  Service: Ophthalmology;  Laterality: Right;  . COLON SURGERY  2002   d/t diverticulosis  . COLONOSCOPY    . COLOSTOMY    . ESOPHAGOGASTRODUODENOSCOPY    . EYE SURGERY    .  KNEE ARTHROSCOPY     bilateral  . TEE WITHOUT CARDIOVERSION N/A 08/04/2019   Procedure: TRANSESOPHAGEAL ECHOCARDIOGRAM (TEE);  Surgeon: Geralynn Rile, MD;  Location: [place];  Service: Cardiovascular;  Laterality: N/A;  . TONSILLECTOMY    . TOTAL KNEE ARTHROPLASTY  2005/2008   bilateral  . TOTAL KNEE REVISION Right 10/24/2015   Procedure: TOTAL KNEE REVISION;  Surgeon: [name], MD;  Location: [place];  Service: Orthopedics;  Laterality: Right;  . YAG LASER APPLICATION Bilateral 07/30/1285   Procedure: YAG LASER  APPLICATION;  Surgeon: Myrtha Mantis., MD;  Location: [place];  Service: Ophthalmology;  Laterality: Bilateral;    Family History  Problem Relation Age of Onset  . Anesthesia problems Neg Hx   . Hypotension Neg Hx   . Malignant hyperthermia Neg Hx   . Pseudochol deficiency Neg Hx     Social History:  Was living alone prior to last stroke and used walker for mobility. Plans on d/c with daughter (caregiver for 7 children?) once housing situation resolved. She reports that she quit smoking about 16 years ago. She has never used smokeless tobacco. She reports that she does not drink alcohol and does not use drugs.    Allergies: No Known Allergies    Medications Prior to Admission  Medication Sig Dispense Refill  . apixaban (ELIQUIS) 5 MG TABS tablet Take 1 tablet (5 mg total) by mouth every 12 (twelve) hours. 60 tablet 0  . cycloSPORINE (RESTASIS) 0.05 % ophthalmic emulsion Place 1 drop into both eyes 3 (three) times daily as needed (dry eyes).     Marland Kitchen etanercept (ENBREL) 50 MG/ML injection Inject 50 mg into the skin every Friday.    . folic acid (FOLVITE) 1 MG tablet Take 1 mg by mouth daily.      Marland Kitchen HYDROcodone-acetaminophen (NORCO) 10-325 MG tablet Take 1 tablet by mouth 3 (three) times daily as needed for severe pain.     . hydroxychloroquine (PLAQUENIL) 200 MG tablet Take 200 mg by mouth 2 (two) times daily.    Marland Kitchen leflunomide (ARAVA) 10 MG tablet Take 10 mg by mouth daily.    Marland Kitchen levothyroxine (SYNTHROID, LEVOTHROID) 50 MCG tablet Take 50 mcg by mouth daily.      . metoprolol tartrate (LOPRESSOR) 25 MG tablet Take 0.5 tablets (12.5 mg total) by mouth 2 (two) times daily. 60 tablet 0  . predniSONE (DELTASONE) 5 MG tablet Take 5 mg by mouth daily.    . rosuvastatin (CRESTOR) 20 MG tablet Take 1 tablet (20 mg total) by mouth daily at 6 PM. 30 tablet 0    Home: Home Living Family/patient expects to be discharged to:: Skilled nursing facility Living Arrangements: Children Available  Help at Discharge: Family, Available PRN/intermittently Type of Home: [place] Access: Level entry Home Layout: One level Bathroom Shower/Tub: Multimedia programmer: Standard Home Equipment: Grab bars - tub/shower, Environmental consultant - 4 wheels  Functional History: Prior Function Level of Independence: Needs assistance Gait / Transfers Assistance Needed: pt ambulates with rollator ADL's / Homemaking Assistance Needed: Daughter assists with cooking, cleaning. Pt dresses herself with pullover gowns.  Comments: Pt mainly stays at home watching TV and has transportation to doctor's appointments.  Functional Status:  Mobility: Bed Mobility Overal bed mobility: Needs Assistance Bed Mobility: Supine to Sit, Sit to Supine Supine to sit: Mod assist, HOB elevated Sit to supine: Max assist, +2 for physical assistance General bed mobility comments: Pt requiring multimodal verbal cues for sitting  EOB and for lying back down.  Pt requiring maxA+2 for BLEs back onto bed. Transfers Overall transfer level: Needs assistance Equipment used: Rolling walker (2 wheeled) Transfers: Sit to/from Stand Sit to Stand: Min assist General transfer comment: deferred as pt unable to stand with momentum; pt scooting to [facility] x2 times before too fatigued. Ambulation/Gait Ambulation/Gait assistance: Min guard Gait Distance (Feet): 15 Feet Assistive device: Rolling walker (2 wheeled) Gait Pattern/deviations: Step-to pattern, Wide base of support General Gait Details: pt with short step to gait, wide BOS, reduced gait speed Gait velocity: reduced Gait velocity interpretation: <1.31 ft/sec, indicative of household ambulator    ADL: ADL Overall ADL's : Needs assistance/impaired Eating/Feeding: Minimal assistance, Sitting, Bed level Grooming: Moderate assistance, Sitting, Bed level Upper Body Bathing: Maximal assistance, Sitting Lower Body Bathing: Maximal assistance, Cueing for safety,  Sitting/lateral leans, Bed level Upper Body Dressing : Moderate assistance, Sitting Lower Body Dressing: Maximal assistance, +2 for physical assistance, Cueing for safety, Sitting/lateral leans, Bed level Toilet Transfer: Maximal assistance, +2 for physical assistance, +2 for safety/equipment Toilet Transfer Details (indicate cue type and reason): pt scooting towards HOB: pt unable to take steps Toileting- Clothing Manipulation and Hygiene: Maximal assistance, +2 for physical assistance, +2 for safety/equipment, Sitting/lateral lean, Bed level Functional mobility during ADLs: Maximal assistance, +2 for physical assistance, +2 for safety/equipment General ADL Comments: Pt limited by decreased strength, decreased activity tolerance, decreased ability to follow commands and increased pain.  Cognition: Cognition Overall Cognitive Status: Difficult to assess Orientation Level: Disoriented to situation, Disoriented to time, Disoriented to place, Oriented to person Cognition Arousal/Alertness: Lethargic Behavior During Therapy: Flat affect Overall Cognitive Status: Difficult to assess General Comments: Pt unable to answer any orientation questions - pt just stating "Mhmm." Difficult to assess due to: Level of arousal   Blood pressure (!) 113/46, pulse 71, temperature 98.1 F (36.7 C), temperature source Oral, resp. rate 16, height _0  (1.6 m), weight 119.2 kg, SpO2 95 %. Physical Exam Vitals and nursing note reviewed.  Constitutional:      Appearance: Normal appearance.  HENT:     Head: Normocephalic and atraumatic.  Eyes:     Extraocular Movements: Extraocular movements intact.     Conjunctiva/sclera: Conjunctivae normal.     Pupils: Pupils are equal, round, and reactive to light.  Cardiovascular:     Rate and Rhythm: Normal rate and regular rhythm.     Heart sounds: Normal heart sounds.  Pulmonary:     Effort: Pulmonary effort is normal.     Breath sounds: Normal breath sounds.    Abdominal:     General: Abdomen is flat. Bowel sounds are normal.     Palpations: Abdomen is soft.  Musculoskeletal:     Right lower leg: No edema.     Left lower leg: No edema.     Comments: Well healed TKR scars on bilateral knees.   Skin:    General: Skin is warm and dry.  Neurological:     Mental Status: She is alert and oriented to person, place, and time.     Comments: Expressive deficits and tends to answer "yes ma'am"  to most questions. She was able to chose situation with choice of two but thought that she was home and wanted to go home on questioning. She was able to follow simple one step motor commands. RUE> RLE weakness. Mild right inattention noted. .   Motor strength is to minus at the deltoid and biceps 0 at the triceps 0 at  the finger flexors and extensors on the right hand 3 at the right hip flexor knee extensor and ankle dorsiflexor 4+ in the left upper and left lower limb Sensation cannot assess secondary to aphasia.  She does feel pinch in the right hand. Sitting balance is poor  Psychiatric:        Mood and Affect: Mood normal.        Behavior: Behavior normal.   2 aphasia.  Results for orders placed or performed during the hospital encounter of 08/08/19 (from the past 24 hour(s))  APTT     Status: Abnormal   Collection Time: 08/14/19  1:14 PM  Result Value Ref Range   aPTT 109 (H) 24 - 36 seconds  APTT     Status: Abnormal   Collection Time: 08/14/19  3:14 PM  Result Value Ref Range   aPTT 70 (H) 24 - 36 seconds  APTT     Status: Abnormal   Collection Time: 08/15/19  9:54 AM  Result Value Ref Range   aPTT 115 (H) 24 - 36 seconds  Heparin level (unfractionated)     Status: Abnormal   Collection Time: 08/15/19  9:54 AM  Result Value Ref Range   Heparin Unfractionated 0.90 (H) 0.30 - 0.70 IU/mL  CBC     Status: Abnormal   Collection Time: 08/15/19  9:54 AM  Result Value Ref Range   WBC 8.6 4.0 - 10.5 K/uL   RBC 4.68 3.87 - 5.11 MIL/uL   Hemoglobin  11.8 (L) 12.0 - 15.0 g/dL   HCT 39.7 36 - 46 %   MCV 84.8 80.0 - 100.0 fL   MCH 25.2 (L) 26.0 - 34.0 pg   MCHC 29.7 (L) 30.0 - 36.0 g/dL   RDW 14.5 11.5 - 15.5 %   Platelets 286 150 - 400 K/uL   nRBC 0.0 0.0 - 0.2 %  Basic metabolic panel     Status: Abnormal   Collection Time: 08/15/19  9:54 AM  Result Value Ref Range   Sodium 133 (L) 135 - 145 mmol/L   Potassium 3.9 3.5 - 5.1 mmol/L   Chloride 94 (L) 98 - 111 mmol/L   CO2 26 22 - 32 mmol/L   Glucose, Bld 114 (H) 70 - 99 mg/dL   BUN 54 (H) 8 - 23 mg/dL   Creatinine, Ser 2.36 (H) 0.44 - 1.00 mg/dL   Calcium 9.3 8.9 - 10.3 mg/dL   GFR calc non Af Amer 20 (L) >60 mL/min   GFR calc Af Amer 23 (L) >60 mL/min   Anion gap 13 5 - 15   No results found.   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 Does the need for close, 24 hr/day medical supervision in concert with the patient's rehab needs make it unreasonable for this patient to be served in a less intensive setting? Yes 2. Co-Morbidities requiring supervision/potential complications: Morbid obesity, chronic renal insufficiency history of combined diastolic CHF 3. Due to bladder management, bowel management, safety, skin/wound care, disease management, medication administration, pain management and patient education, does the patient require 24 hr/day rehab nursing? Yes 4. Does the patient require coordinated care of a physician, rehab nurse, therapy disciplines of PT, OT, speech to address physical and functional deficits in the context of the above medical diagnosis(es)? Yes Addressing deficits in the following areas: balance, endurance, locomotion, strength, transferring, bowel/bladder control, bathing, dressing, feeding, grooming, toileting, cognition, speech, swallowing and psychosocial support 5. Can the patient  actively participate in an intensive therapy program of at least 3 hrs of therapy per day at least 5 days per week?  Yes 6. The potential for patient to make measurable gains while on inpatient rehab is good 7. Anticipated functional outcomes upon discharge from inpatient rehab are mod assist  with PT, mod assist with OT, mod assist with SLP. 8. Estimated rehab length of stay to reach the above functional goals is: 21 to 25 days 9. Anticipated discharge destination: Home 10. Overall Rehab/Functional Prognosis: good  RECOMMENDATIONS: This patient's condition is appropriate for continued rehabilitative care in the following setting: CIR Patient has agreed to participate in recommended program. Yes Note that insurance prior authorization may be required for reimbursement for recommended care.  Comment: Need to assess family's ability to provide care any 24 hour/day mod assist level.  If patient is going to SNF may benefit from CIR to reduce burden care.  Also need to assess the patient's willingness to participate a more intensive inpatient rehab program   Bary Leriche, [place] 08/15/2019

## 2019-08-16 LAB — RENAL FUNCTION PANEL
Albumin: 2.7 g/dL — ABNORMAL LOW (ref 3.5–5.0)
Anion gap: 10 (ref 5–15)
BUN: 49 mg/dL — ABNORMAL HIGH (ref 8–23)
CO2: 26 mmol/L (ref 22–32)
Calcium: 9.3 mg/dL (ref 8.9–10.3)
Chloride: 99 mmol/L (ref 98–111)
Creatinine, Ser: 1.75 mg/dL — ABNORMAL HIGH (ref 0.44–1.00)
GFR calc Af Amer: 33 mL/min — ABNORMAL LOW (ref >60)
GFR calc non Af Amer: 29 mL/min — ABNORMAL LOW (ref >60)
Glucose, Bld: 89 mg/dL (ref 70–99)
Phosphorus: 4.5 mg/dL (ref 2.5–4.6)
Potassium: 4.2 mmol/L (ref 3.5–5.1)
Sodium: 135 mmol/L (ref 135–145)

## 2019-08-16 LAB — CBC WITH DIFFERENTIAL/PLATELET
Abs Immature Granulocytes: 0.02 10*3/uL (ref 0.00–0.07)
Basophils Absolute: 0.1 10*3/uL (ref 0.0–0.1)
Basophils Relative: 1 %
Eosinophils Absolute: 0.3 10*3/uL (ref 0.0–0.5)
Eosinophils Relative: 3 %
HCT: 37.6 % (ref 36.0–46.0)
Hemoglobin: 11.2 g/dL — ABNORMAL LOW (ref 12.0–15.0)
Immature Granulocytes: 0 %
Lymphocytes Relative: 32 %
Lymphs Abs: 2.3 10*3/uL (ref 0.7–4.0)
MCH: 25.2 pg — ABNORMAL LOW (ref 26.0–34.0)
MCHC: 29.8 g/dL — ABNORMAL LOW (ref 30.0–36.0)
MCV: 84.7 fL (ref 80.0–100.0)
Monocytes Absolute: 0.9 10*3/uL (ref 0.1–1.0)
Monocytes Relative: 12 %
Neutro Abs: 3.8 10*3/uL (ref 1.7–7.7)
Neutrophils Relative %: 52 %
Platelets: 243 10*3/uL (ref 150–400)
RBC: 4.44 MIL/uL (ref 3.87–5.11)
RDW: 14.1 % (ref 11.5–15.5)
WBC: 7.4 10*3/uL (ref 4.0–10.5)
nRBC: 0 % (ref 0.0–0.2)

## 2019-08-16 NOTE — TOC Progression Note (Signed)
Transition of Care [facility]) - Progression Note    Patient Details  Name: [name] MRN: [id number] Date of Birth: 04-Jun-1945  Transition of [facility]) CM/SW [place], LCSW Phone Number: 08/16/2019, 3:42 PM  Clinical Narrative:    CSW placed housing resources in patient's room as requested by patient's daughter. CSW provided an update to the patient and will continue to follow for daughter's decision on CIR vs SNF.    Expected Discharge Plan: [place] Barriers to Discharge: Continued Medical Work up  Expected Discharge Plan and Services Expected Discharge Plan: [place] arrangements for the past 2 months: Apartment Expected Discharge Date: 08/10/19                                     Social Determinants of Health (SDOH) Interventions    Readmission Risk Interventions No flowsheet data found.

## 2019-08-16 NOTE — Progress Notes (Signed)
Daily Progress Note   Patient Name: [name]       Date: 08/16/2019 DOB: 1945-02-05  Age: 74 y.o. MRN#: [id number] Attending Physician: Leeanne Rio, MD Primary Care Physician: Dixie Dials, MD Admit Date: 08/08/2019  Reason for Consultation/Follow-up:   To discuss complex medical decision making related to patient's goals of care  Spoke with Stroke Team regarding patient.   Subjective: Patient and I discuss her improvements.  She is eating well.  Speech is more clear.  She can move all extremities on command.  States she is feeling better.    We discuss going to some type of rehab to continue to improve. She agrees that she wants to go to rehab for a few weeks.  We discussed code status.  I asked if she had another big stroke and her health was much worse would she want to be on life support if needed.  She states that "no" she would not want to be on life support.   I explain that in our system right now she is a full code - meaning that if her heart stopped we would go to heroic measures to restart it.  Ms. Trachtenberg states that she does not want that.  I asked if her daughter understood that and she responded "I hope so".  I will give daughter a call.   Assessment: Deficits improving.  Eating, drinking, moving all extremities on command.  Wants to continue to improve and go home after rehab.   Patient Profile/HPI:  74 y.o. female  with past medical history of multiple strokes, right sided HF, pulmonary HTN with cor pulmonale, small PFO, rheumatoid arthritis on mexotrexate, chronic pain, morbid obesity who was admitted on 08/08/2019 with right sided weakness and hypoxic respiratory failure (in the 80s on room air).  Work up reveals a moderate to large left MCA stroke as well as  other small strokes.  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   She is not a candidate for surgical intervention due to her other co-morbidities.  Of note the patient also has acute renal failure and a degree of acute right sided heart failure.  PMT was consulted for goals of care.  Family has requested a conversation with CT surgery prior to  meeting with PMT     Length of Stay: 4   Vital Signs: BP (!) 104/53 (BP Location: Right Wrist)   Pulse 64   Temp (!) 97.4 F (36.3 C)   Resp 13   Ht _0  (1.6 m)   Wt 119 kg   SpO2 98%   BMI 46.47 kg/m  SpO2: SpO2: 98 % O2 Device: O2 Device: Nasal Cannula O2 Flow Rate: O2 Flow Rate (L/min): 2 L/min   Palliative Care Plan    Recommendations/Plan:  Continue full scope treatment.   If patient arrests she does not want resuscitation.  I will speak with daughter to confirm before changing code status to DNR.  Patient wanting to continue to progress.  Code Status:  Full code  Prognosis:   Unable to determine   Discharge Planning:  To Be Determined   Thank you for allowing the Palliative Medicine Team to assist in the care of this patient.  Total time spent:  25 min.     Greater than 50%  of this time was spent counseling and coordinating care related to the above assessment and plan.  Florentina Jenny, PA-C Palliative Medicine  Please contact Palliative MedicineTeam phone at [phone] for questions and concerns between 7 am - 7 pm.   Please see AMION for individual provider pager numbers.

## 2019-08-16 NOTE — Progress Notes (Signed)
S: Patient seen laying in bed. Continues to have receptive and expressive aphasia. Able to follow some commands but not all.   O: Vitals:   08/16/19 1256 08/16/19 2046  BP: (!) 125/54 (!) 122/55  Pulse: 66 71  Resp: 14 20  Temp: 98.7 F (37.1 C) 98.3 F (36.8 C)  SpO2: 100% 92%  Gen: awake, alert, not oriented to person, place or time   Neuro:  CN 1, 2- not assessed CN 3, 4 and 6: unable to follow finger with her eyes CN 5: facial sensation same on both sides of face CN 7: unable to smile but opens mouth and able to shut eyes CN 8: difficulty hearing on right side, able to hear on left  CN 9, 10: palate elevation, swallowing CN 11: lateral head rotation intact, unable to shrug shoulders  CN 12: difficulty with tongue protrusion   Strength: able to lift all extremities against gravity. Decreased grip strength b/l but more-so in right hand   A/P: Continue plan as per progress note from earlier.

## 2019-08-16 NOTE — Progress Notes (Addendum)
Inpatient Rehabilitation Admissions Coordinator  I met at bedside with patient for rehab assessment. Patient with aphasia which affects our discussion on caregiver support available after rehab at any venue. I spoke with daughter, [name]. Daughter lives with her own daughter and cares for 7 grand children. We discussed that [place] will not likely approve patient to admit to CIR and then also approve SNF. [name] is looking for another larger home to be able to care for her Mom and her Mom's apartment at [facility] is in the process to be remodeled with patient to be relocated. [name] to discuss with family and I will follow up with her tomorrow to assist with pursuing possible CIR admit vs SNF for her rehab recovery. I will begin insurance authorization in case that is venue that daughter would like to pursue.  Danne Baxter, RN, MSN Rehab Admissions Coordinator [phone] 08/16/2019 11:55 AM

## 2019-08-16 NOTE — Progress Notes (Signed)
STROKE TEAM PROGRESS NOTE     INTERVAL HISTORY Patient continues to improve.  She is more awake and interactive.  She is able to speak a few words and answer her name and short sentences.  She  can understand simple midline and occasionally one-step commands.  She is able to move right upper and lower extremity against gravity but still has right hemiparesis.  I spoke to the patient's daughter [name] over the phone and family wants to pursue inpatient rehab and aggressive medical care   OBJECTIVE Vitals:   08/15/19 1735 08/15/19 2146 08/16/19 0300 08/16/19 0754  BP: (!) 110/51 (!) 126/111  (!) 104/53  Pulse: 66 73  64  Resp: _0 Temp: 98.6 F (37 C) 98.8 F (37.1 C)  (!) 97.4 F (36.3 C)  TempSrc:  Oral    SpO2: 100% 98%  98%  Weight:   119 kg   Height:        CBC:  Recent Labs  Lab 08/15/19 0954 08/16/19 0641  WBC 8.6 7.4  NEUTROABS  --  3.8  HGB 11.8* 11.2*  HCT 39.7 37.6  MCV 84.8 84.7  PLT 286 224    Basic Metabolic Panel:  Recent Labs  Lab 08/15/19 0954 08/16/19 0641  NA 133* 135  K 3.9 4.2  CL 94* 99  CO2 26 26  GLUCOSE 114* 89  BUN 54* 49*  CREATININE 2.36* 1.75*  CALCIUM 9.3 9.3  PHOS  --  4.5   IMAGING past 24h No results found.    PHYSICAL EXAM  Blood pressure (!) 104/53, pulse 64, temperature (!) 97.4 F (36.3 C), resp. rate 13, height _1  (1.6 m), weight 119 kg, SpO2 98 %. Obese elderly African-American lady not in distress. . Afebrile. Head is nontraumatic. Neck is supple without bruit.    Cardiac exam no murmur or gallop. Lungs are clear to auscultation. Distal pulses are well felt.  Neurological Exam : Awake alert she has nonfluent aphasia can speak occasional words and short sentences.  She is able to repeat well but unable to name.  Comprehension is poor but able to follow simple midline and one-step commands only.  She blinks to threat more on the left than on the right. Right lower facial weakness. Tongue midline. Right  hemiplegia 3/5 strength in right upper extremity and 3/5 in the right lower extremity with decreased tone on the right. Good antigravity strength on the left. Sensation cannot reliably tested. Right plantar upgoing left downgoing. Gait not tested    ASSESSMENT/PLAN [name] is a 74 y.o. 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 S/P loop implant 08/05/19. Patient returned to [facility] 08/08/19 with acute hypoxemic respiratory failure (CHF) and worsening symptoms of right hemiparesis, right facial droop and aphasia (both receptive and expressive) with extreme difficulty following commands.  She did not receive IV t-PA due to recent stroke.  Stroke: Marked interval increase in extent of an acute/early subacute left MCA territory cortical/subcortical infarct - embolic - source unknown.  CT head - not ordered  MRI head - Marked interval increase in extent of an acute/early subacute left MCA territory cortical/subcortical infarct. A moderate to large confluent infarct is now present within the left temporal and parietal lobes as well as  posterior left insula. No significant mass effect at this time. Redemonstrated small cortically based subacute infarcts within the left pre and postcentral gyri. New punctate acute/early subacute infarct within the mid left frontal lobe white matter. A subcentimeter subacute infarct within the right cerebellum was better appreciated on the prior MRI of 08/01/2019. Stable generalized parenchymal atrophy. Small chronic infarcts within the bilateral cerebellar hemispheres  MRA head - Hypoplastic right vertebral artery appears grossly patent on this exam, previously not visualized on recent MRA, and question to be occluded at that time. This  was most likely artifactual nature on prior study.   Carotid Doppler - 08/02/19 - near normal  TEE 08/04/19 - see report above  Lacey Jensen Virus 2 - negative  LDL - 76  HgbA1c - 5.7  UDS - 08/01/19 - opiates (Rx - chronic pain)  Loop implant 08/05/19 -> interrogate  VTE prophylaxis - Eliquis  Eliquis (apixaban) daily prior to admission, now on Eliquis (apixaban) daily (for DVT)  Therapy recommendations:  CIR  Disposition:  Pending  Given ongoing improvement, stroke recommends return to rehab. Discussed w/ rehab and palliative care team  R DVT  Age-indeterminate DVT found in right peroneal veins on 7/9.  IV heparin -> Eliquis  Hypertension  Home BP meds: metoprolol  Current BP meds: metoprolol  Stable . Permissive hypertension (OK if < 220/120) but gradually normalize in 5-7 days . Long-term BP goal normotensive  Hyperlipidemia  Home Lipid lowering medication: Crestor 20 mg daily  LDL 76, goal < 70  Current lipid lowering medication: Crestor 20 mg daily  Continue statin at discharge  Other Stroke Risk Factors  Advanced age  Former cigarette smoker - quit  Morbid Obesity, Body mass index is 46.47 kg/m., recommend weight loss, diet and exercise as appropriate   Hx stroke/TIA  Coronary artery disease  Congestive Heart Failure  Other Active Problems  Acute hypoxic respiratory failure, in setting of cor pulmonaleresolved  Severe RV dysfunction with cor pulmonale Group III PAH: Chronic.  RA - prednisone, leflunomide, and hydroxychloroquine.  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  Transfer to inpatient rehab after insurance approval.  Continue conservative treatment for fibroblastoma.  And Eliquis for her DVT.  Long discussion with patient and her daughter and answered questions.  Stroke team will sign off.  Kindly call for questions.  Greater than 50% time during this 25-minute visit was spent on counseling and coordination of care and discussion with care team and family and answering questions  Antony Contras, MD  To contact Stroke Continuity provider, please refer to [email or web address] After hours, contact General Neurology

## 2019-08-16 NOTE — Progress Notes (Signed)
Family Medicine Teaching Service Daily Progress Note Intern Pager: [phone]  Patient name: [name] Medical record number: [id number] Date of birth: 05-09-1945 Age: 74 y.o. Gender: female  Primary Care Provider: Dixie Dials, MD Consultants: Neurology and CVTS Code Status: FULL  Pt Overview and Major Events to Date:  Admitted 7/12.  New CVA 7/16  Assessment and Plan: Debra Trinka Hooksis a 74 y.o.femalepresenting after a positive result on Blood culture post discharge to a SNF.PMH is significant for CVA,hyperlipidemia, hypertension, anemia, and rheumatoid arthritis.  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 Right upper and lower extremity weakness/paralysis and global aphasia. 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 Others possible source is DVT with PFO. Neurology and CVTS consulted. Not a candidate for surgery of the mitral valve.  -Neurology on board, appreciate recommendations -CVTS consulted, patient not a good candidate for surgery. -Continue Eliquis -Frequent neuro checks -PT/OT -Appreciate speech recommendations and assistance with aphasia -Continue Crestor -- Palliative has initiated conversation with the patient's daughter -Call daughter with updates  Acute hypoxic respiratory failure, in setting of cor pulmonaleresolved On room air most hours. Did use 2L Brutus overnight. Patient has AKI. 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 Has been present long-term, however she has not followed upoutpatient-Consider pulmonology consult while inpatient, pending disposition for primary concern as above -Oxygen therapy PRN   AKIon CKD LM:[id number]. Creatinine down to 1.75 from 2.36. -Continue to monitor daily   -Strict I and O's -Monitor BMP - discontinue lasix  RDVT: Stable. Age-indeterminate DVT found in right peroneal veins on 7/9. -Eliquis was restarted 08/15/2019  Previous CorynebacteriumBcxcontaminant: Resolved. Patient was discharged to SNFon 7/10with cultures pending; however they came back positive for corynebacterium,felt to be contaminant. Repeat culture has shown no growth6days.  EFE:OFHQRFX, stable. Slightly elevated today. -Will continue low-dose home metoprolol for now -Monitor BP  Rheumatoid arthritis- stable, chronic Patient takes prednisone5 mg, leflunomide, and hydroxychloroquine.  - Continue home meds - Protonix  Hyperlipidemia- stable and chronic Patient is post- CVA and has a hx HLD. Patient takes rosuvastatin at home. - Continue home meds  Chronic pain syndrome - Continue homeNorco 10-325TID PRN  Hypothyroidism- Chronic and stable Patient takes levothyroxine. - Continue home medication    FEN/GI: Soft PPx: Eliquis  Disposition: SNF  Subjective:  No overnight events. Patient appears improved. She is oriented to person, place, and time. She can answer a few questions before her expressive aphasia becomes an issue. She is able to follow most commands, but continues to show signs of receptive aphasia. After providing patient examples she is often able to follow the commands she did not interpret well with auditory command. Patient reported she was doing well, and attempted to make a statement that sounded like she was ready to go home and "on her last leg." Patient is in no pain and appears in a good mood.   Objective: Temp:  [98.1 F (36.7 C)-98.8 F (37.1 C)] 98.8 F (37.1 C) (07/19 2146) Pulse Rate:  [66-73] 73 (07/19 2146) Resp:  [16-18] 18 (07/19 2146) BP: (110-126)/(46-111) 126/111 (07/19 2146) SpO2:  [95 %-100 %] 98 % (07/19 2146) Weight:  [588 kg] 119 kg (07/20 0300) Physical Exam: General: Resting comfortably in a good  mood. Has eaten some breakfast, did not drink the coffee that she normally likes.  Cardiovascular: murmur auscultated, regular rate and rhythm  Respiratory: Clear and equal to bilateral auscl. Abdomen: Colostomy bag is clean, no pain to palpation Extremities: edema in the bil. Lower extremities, distal pulses intact Patient is able to lift all extremities against gravity. Continues to have weak grip strength in the R. hand in comparison to the left. Follow commands is still difficult so strength was only assessed in grip.  Laboratory: Recent Labs  Lab 08/13/19 0420 08/14/19 0400 08/15/19 0954  WBC 5.2 8.6 8.6  HGB 12.5 12.5 11.8*  HCT 40.8 41.6 39.7  PLT PLATELET CLUMPS NOTED ON SMEAR, UNABLE TO ESTIMATE 293 286   Recent Labs  Lab 08/13/19 0420 08/14/19 0400 08/15/19 0954  NA 137 135 133*  K 5.1 4.5 3.9  CL 99 95* 94*  CO2 _0 BUN 25* 29* 54*  CREATININE 1.42* 1.62* 2.36*  CALCIUM 9.4 9.6 9.3  PROT 7.3  --   --   BILITOT 1.3*  --   --   ALKPHOS 44  --   --   ALT 17  --   --   AST 37  --   --   GLUCOSE 84 105* 114*      Imaging/Diagnostic Tests: None new  Freida Busman, MD 08/16/2019, 6:07 AM PGY-1, [place] Intern pager: [phone], text pages welcome

## 2019-08-17 DIAGNOSIS — Z7189 Other specified counseling: Secondary | ICD-10-CM

## 2019-08-17 LAB — CBC WITH DIFFERENTIAL/PLATELET
Abs Immature Granulocytes: 0.03 10*3/uL (ref 0.00–0.07)
Basophils Absolute: 0.1 10*3/uL (ref 0.0–0.1)
Basophils Relative: 1 %
Eosinophils Absolute: 0.2 10*3/uL (ref 0.0–0.5)
Eosinophils Relative: 3 %
HCT: 37.2 % (ref 36.0–46.0)
Hemoglobin: 11 g/dL — ABNORMAL LOW (ref 12.0–15.0)
Immature Granulocytes: 0 %
Lymphocytes Relative: 32 %
Lymphs Abs: 2.2 10*3/uL (ref 0.7–4.0)
MCH: 24.9 pg — ABNORMAL LOW (ref 26.0–34.0)
MCHC: 29.6 g/dL — ABNORMAL LOW (ref 30.0–36.0)
MCV: 84.2 fL (ref 80.0–100.0)
Monocytes Absolute: 0.8 10*3/uL (ref 0.1–1.0)
Monocytes Relative: 12 %
Neutro Abs: 3.5 10*3/uL (ref 1.7–7.7)
Neutrophils Relative %: 52 %
Platelets: 222 10*3/uL (ref 150–400)
RBC: 4.42 MIL/uL (ref 3.87–5.11)
RDW: 14 % (ref 11.5–15.5)
WBC: 6.8 10*3/uL (ref 4.0–10.5)
nRBC: 0 % (ref 0.0–0.2)

## 2019-08-17 LAB — RENAL FUNCTION PANEL
Albumin: 2.7 g/dL — ABNORMAL LOW (ref 3.5–5.0)
Anion gap: 10 (ref 5–15)
BUN: 45 mg/dL — ABNORMAL HIGH (ref 8–23)
CO2: 26 mmol/L (ref 22–32)
Calcium: 9.4 mg/dL (ref 8.9–10.3)
Chloride: 98 mmol/L (ref 98–111)
Creatinine, Ser: 1.57 mg/dL — ABNORMAL HIGH (ref 0.44–1.00)
GFR calc Af Amer: 38 mL/min — ABNORMAL LOW (ref >60)
GFR calc non Af Amer: 33 mL/min — ABNORMAL LOW (ref >60)
Glucose, Bld: 89 mg/dL (ref 70–99)
Phosphorus: 3.6 mg/dL (ref 2.5–4.6)
Potassium: 4.2 mmol/L (ref 3.5–5.1)
Sodium: 134 mmol/L — ABNORMAL LOW (ref 135–145)

## 2019-08-17 NOTE — Progress Notes (Signed)
Physical Therapy Treatment Patient Details Name: [name] MRN: [id number] DOB: August 09, 1945 Today's Date: 08/17/2019    History of Present Illness Pt is 74 y.o. female with PMH of HTN, HLD, CAD, anemia, RA, TKR (R), morbid obesity, decreased ROM in L shoulder, Presented to ED with R hand weakness. MRI reveals small acute cortical/subcortical infarct affecting the left pre and post central gyri. Pt discharged on 7/10 to SNF, but was later found to have a positive blood culture for corynebacterium and readmitted on 7/12.    PT Comments    Patient received in bed, very pleasant but with ongoing expressive/receptive difficulties from her CVA, very happy to see therapy and laughing/joking during session. Able to progress mobility today significantly and with much lower levels of physical assistance for bed mobility, although she does require Min-ModAx2 for functional transfers including sit to stand/stand pivots to recliner with RW. Needed quite a bit of physical assist on the right ot keep hand on RW handrest and to control RW/keep it in safe proximity for transfer. Left sitting up in recliner with all needs met, chair alarm active and NT aware of mobility status. Continue to recommend CIR moving forward.     Follow Up Recommendations  CIR;Supervision/Assistance - 24 hour     Equipment Recommendations  Rolling walker with 5" wheels    Recommendations for Other Services       Precautions / Restrictions Precautions Precautions: Fall Precaution Comments: watch SpO2, expressive aphasia Restrictions Weight Bearing Restrictions: No    Mobility  Bed Mobility Overal bed mobility: Needs Assistance Bed Mobility: Supine to Sit     Supine to sit: Min assist     General bed mobility comments: Min A for scooting to EOB. Improved advancement of LE to EOB.   Transfers Overall transfer level: Needs assistance Equipment used: Rolling walker (2 wheeled) Transfers: Sit to/from [employer] Sit to Stand: Min assist;+2 physical assistance;+2 safety/equipment Stand pivot transfers: Mod assist;+2 physical assistance;+2 safety/equipment       General transfer comment: Min A for power up with cues for hand placement. Pt overall Mod A x 2 for stand pivot to recliner with assistance needed to maintain balance, cue for B feet sequencing and manuvering RW  Ambulation/Gait             General Gait Details: deferred but able to take pivotal steps to recliner   Stairs             Wheelchair Mobility    Modified Rankin (Stroke Patients Only) Modified Rankin (Stroke Patients Only) Pre-Morbid Rankin Score: Moderate disability Modified Rankin: Moderately severe disability     Balance Overall balance assessment: Needs assistance Sitting-balance support: Single extremity supported Sitting balance-Leahy Scale: Fair Sitting balance - Comments: Static sitting balance fair, but Min A to maintain safety during dynamic sitting balance and when attempting to scoot to EOB   Standing balance support: Bilateral upper extremity supported Standing balance-Leahy Scale: Poor Standing balance comment: reliant on UE support and Min-ModA for balance                            Cognition Arousal/Alertness: Awake/alert Behavior During Therapy: WFL for tasks assessed/performed Overall Cognitive Status: Impaired/Different from baseline Area of Impairment: Orientation;Attention;Memory;Following commands;Safety/judgement;Awareness;Problem solving                 Orientation Level:  (reports birthday at [birth year] instead of [birth year]) Current Attention Level: Sustained Memory: Decreased recall  of precautions;Decreased short-term memory Following Commands: Follows one step commands inconsistently Safety/Judgement: Decreased awareness of safety;Decreased awareness of deficits Awareness: Intellectual Problem Solving: Slow processing;Decreased initiation;Difficulty  sequencing;Requires verbal cues;Requires tactile cues General Comments: Pt with improved ability to follow commands, pleasant and laughing during session. Limited by decreased safety with cues needed for OOB sequencing during activities      Exercises      General Comments General comments (skin integrity, edema, etc.): pt on 1LPM O2 unable to obtain SpO2 reading with pulse ox but no SOB noted, did need rest breaks due to fatigue      Pertinent Vitals/Pain Pain Assessment: Faces Pain Score: 0-No pain Faces Pain Scale: No hurt Pain Intervention(s): Limited activity within patient's tolerance;Monitored during session    Home Living                      Prior Function            PT Goals (current goals can now be found in the care plan section) Acute Rehab PT Goals Patient Stated Goal: none stated PT Goal Formulation: With patient Time For Goal Achievement: 08/24/19 Potential to Achieve Goals: Good Progress towards PT goals: Progressing toward goals    Frequency    Min 3X/week      PT Plan Current plan remains appropriate    Co-evaluation PT/OT/SLP Co-Evaluation/Treatment: Yes Reason for Co-Treatment: Complexity of the patient's impairments (multi-system involvement);Necessary to address cognition/behavior during functional activity;For patient/therapist safety;To address functional/ADL transfers PT goals addressed during session: Mobility/safety with mobility;Balance;Proper use of DME OT goals addressed during session: ADL's and self-care      AM-PAC PT "6 Clicks" Mobility   Outcome Measure  Help needed turning from your back to your side while in a flat bed without using bedrails?: A Little Help needed moving from lying on your back to sitting on the side of a flat bed without using bedrails?: A Little Help needed moving to and from a bed to a chair (including a wheelchair)?: A Lot Help needed standing up from a chair using your arms (e.g., wheelchair or  bedside chair)?: A Lot Help needed to walk in hospital room?: A Lot Help needed climbing 3-5 steps with a railing? : Total 6 Click Score: 13    End of Session Equipment Utilized During Treatment: Gait belt Activity Tolerance: Patient tolerated treatment well Patient left: in bed;with call bell/phone within reach;with bed alarm set;with nursing/sitter in room Nurse Communication: Mobility status PT Visit Diagnosis: Other abnormalities of gait and mobility (R26.89);Muscle weakness (generalized) (M62.81) Hemiplegia - Right/Left: Right Hemiplegia - dominant/non-dominant: Dominant Hemiplegia - caused by: Cerebral infarction     Time: 8441-7127 PT Time Calculation (min) (ACUTE ONLY): 24 min  Charges:  $Therapeutic Activity: 8-22 mins (co-tx)                     Windell Norfolk, DPT, PN1   Supplemental Physical Therapist [place]    Pager [phone] Acute Rehab Office [phone]

## 2019-08-17 NOTE — Progress Notes (Signed)
Speech Language Pathology Treatment: Cognitive-Linquistic  Patient Details Name: [name] MRN: [id number] DOB: 12-06-1945 Today's Date: 08/17/2019 Time: 7342-8768 SLP Time Calculation (min) (ACUTE ONLY): 10 min  Assessment / Plan / Recommendation Clinical Impression  [name] seen briefly for language intervention prior to RN arrival due to [name]'s leaking colonostomy. Her spontaneous verbal expression continues to contain phonemic paraphasias and neologisms without awareness when her responses are repeated back to her. She needed assist to name common objects on 30% trials with either phrase completion or phonemic cue. Short/automatic social responses are usually appropriate.SLP facilitated responses by utilizing written information for increased comprehension when stating her birthday. ST recommends continued ST at [facility] and staff use gestures, objects and writing for optimal communication.    HPI HPI: [name] is a 74 yo female with recent admission 7/5 for stroke. She d/c to SNF 7/10 but returned 7/12 with positive blood culture for corynebacterium. On 7/16 she was found to have increased R facial droop, R weakness, and aphasia. MRI shows increase in extent of acute/early subacute L MCA infarct. A moderate to large confluent infarct is now present within the left temporal and parietal lobes as well as posterior left insula. New punctate acute/early subacute infarct within the mid left frontal lobe white matter. On 08/02/19 she scored 11/30 on SLUMS (impacted by drowsiness); expressive/receptive language [facility]. PMH includes: HTN, HLD, CAD, anemia, RA, TKR (R), morbid obesity, HH      SLP Plan  Continue with current plan of care       Recommendations                   Follow up Recommendations: Skilled Nursing facility SLP Visit Diagnosis: Aphasia (R47.01) Plan: Continue with current plan of care                       Houston Siren 08/17/2019, 12:15 PM   [name] Colvin Caroli.Ed  Risk analyst [phone] Office [phone]

## 2019-08-17 NOTE — Progress Notes (Signed)
Family Medicine Teaching Service Daily Progress Note Intern Pager: [phone]  Patient name: DEMIRA GWYNNE Medical record number: [id number] Date of birth: March 13, 1945 Age: 74 y.o. Gender: female  Primary Care Provider: Dixie Dials, MD Consultants: Neurology and CVTS Code Status: Full  Pt Overview and Major Events to Date:  Admitted 7/12.  New CVA 7/16  Assessment and Plan: Mauria Asquith Hooksis a 74 y.o.femalepresenting after a positive result on Blood culture post discharge to a SNF.PMH is significant for CVA,hyperlipidemia, hypertension, anemia, and rheumatoid arthritis.  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 Patient is able to understand most simple commands and has improvement in control of her right side. She also has improved expressive ability. Palliative care spoke with the patient, patient would like to change to DNR, but they will speak with the daughter to confirm.  -Neurology on board, appreciate recommendations -CVTS consulted, patient not a good candidate for surgery. -Continue Eliquis -Frequent neuro checks -PT/OT - Reiterate that patient should be doing spirometry and may need nursing to remind her each time they give her medication. -Appreciate speech recommendations and assistance with aphasia  -Continue Crestor --Palliative has initiated conversation with the patient's daughter -Call daughter with updates  Acute hypoxic respiratory failure, in setting of cor pulmonaleresolved On room airmost hours. Did use 2L [place] overnight. Patient has AKI. 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 Has been present long-term, however she has not followed upoutpatient-Consider pulmonology consult while inpatient, pending disposition for primary concern as above -Oxygen therapyPRN   AKIon  CKD MB:[id number]. Creatinine down to1.52fom 1.75. -Continue to monitor daily  -Strict I and O's -Monitor BMP  RDVT: Stable. Age-indeterminate DVT found in right peroneal veins on 7/9. -Eliquis was restarted 08/15/2019  Previous CorynebacteriumBcxcontaminant: Resolved. Patient was discharged to SNFon 7/10with cultures pending; however they came back positive for corynebacterium,felt to be contaminant. Repeat culture has shown no growth6days.  HTSV:XBLTJQZ stable. Slightly elevated today. -Will continue low-dose home metoprolol for now -Monitor BP  Rheumatoid arthritis- stable, chronic Patient takes prednisone5 mg, leflunomide, and hydroxychloroquine.  - Continue home meds - Protonix  Hyperlipidemia- stable and chronic Patient is post- CVA and has a hx HLD. Patient takes rosuvastatin at home. - Continue home meds  Chronic pain syndrome - Continue homeNorco 10-325TID PRN  Hypothyroidism- Chronic and stable Patient takes levothyroxine. - Continue home medication    FEN/GI: Soft PPx: Eliquis  Disposition: SNF vs CIR  Subjective:  Patient had no overnight events. She appears to have improved comprehension. Expressive ability remains stable with what was observed yesterday. Patient reports no pain and no SOB.  Objective: Temp:  [97.4 F (36.3 C)-98.7 F (37.1 C)] 98.3 F (36.8 C) (07/20 2046) Pulse Rate:  [64-71] 71 (07/20 2046) Resp:  [13-20] 20 (07/20 2046) BP: (104-125)/(53-55) 122/55 (07/20 2046) SpO2:  [92 %-100 %] 92 % (07/20 2046) Weight:  [[009kg] 118 kg (07/21 0500) Physical Exam: General: Patient resting in bed comfortably. 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 Respiratory: Clear and equal in upper lobes and RLL, popping heard in LLL, but after 3 deep breaths it disappeared Abdomen: No pain to palpation, Ostomy bag full Extremities: Distal pulses  intact  Laboratory: Recent Labs  Lab 08/15/19 0954 08/16/19 0641 08/17/19 0257  WBC 8.6 7.4 6.8  HGB 11.8* 11.2* 11.0*  HCT 39.7 37.6 37.2  PLT 286 243 222   Recent Labs  Lab 08/13/19 0420 08/14/19 0400 08/15/19 0954 08/16/19 0641 08/17/19 0257  NA 137   < > 133* 135 134*  K 5.1   < > 3.9 4.2 4.2  CL 99   < > 94* 99 98  CO2 25   < > _0 BUN 25*   < > 54* 49* 45*  CREATININE 1.42*   < > 2.36* 1.75* 1.57*  CALCIUM 9.4   < > 9.3 9.3 9.4  PROT 7.3  --   --   --   --   BILITOT 1.3*  --   --   --   --   ALKPHOS 44  --   --   --   --   ALT 17  --   --   --   --   AST 37  --   --   --   --   GLUCOSE 84   < > 114* 89 89   < > = values in this interval not displayed.      Imaging/Diagnostic Tests: None new  Freida Busman, MD 08/17/2019, 6:13 AM PGY-1, [place] Intern pager: [phone], text pages welcome

## 2019-08-17 NOTE — Progress Notes (Signed)
Occupational Therapy Treatment Patient Details Name: [name] MRN: [id number] DOB: 1945/03/06 Today's Date: 08/17/2019    History of present illness Pt is 74 y.o. female with PMH of HTN, HLD, CAD, anemia, RA, TKR (R), morbid obesity, decreased ROM in L shoulder, Presented to ED with R hand weakness. MRI reveals small acute cortical/subcortical infarct affecting the left pre and post central gyri. Pt discharged on 7/10 to SNF, but was later found to have a positive blood culture for corynebacterium and readmitted on 7/12.   OT comments  Pt progressing well with OT goals and improved mobility noted. Pt overall Min A for bed mobility and sit to stand transfers, increasing to Mod A for stand pivot transfers to ensure proper RW use and sequencing of task. Pt demonstrating improved R UE mobility with some tone noted in elbow and difficulty with shoulder flexion - otherwise, improving. Pt Min A for UB ADLs assessed today and Mod A to doff/don new socks sitting in recliner chair. Pt continues to be limited by decreased strength, endurance, dynamic standing balance and safety awareness. Plan to further address standing ADLs during next session as appropriate.    Follow Up Recommendations  CIR;Supervision/Assistance - 24 hour    Equipment Recommendations  Other (comment) (TBD)    Recommendations for Other Services Rehab consult    Precautions / Restrictions Precautions Precautions: Fall Precaution Comments: watch SpO2, expressive aphasia Restrictions Weight Bearing Restrictions: No       Mobility Bed Mobility Overal bed mobility: Needs Assistance Bed Mobility: Supine to Sit     Supine to sit: Min assist     General bed mobility comments: Min A for scooting to EOB. Improved advancement of LE to EOB.   Transfers Overall transfer level: Needs assistance Equipment used: Rolling walker (2 wheeled) Transfers: Sit to/from [facility] Sit to Stand: Min assist Stand pivot  transfers: Mod assist;+2 physical assistance;+2 safety/equipment       General transfer comment: Min A for power up with cues for hand placement. Pt overall Mod A x 2 for stand pivot to recliner with assistance needed to maintain balance, cue for B feet sequencing and manuvering RW    Balance Overall balance assessment: Needs assistance Sitting-balance support: Single extremity supported Sitting balance-Leahy Scale: Fair Sitting balance - Comments: Static sitting balance fair, but Min A to maintain safety during dynamic sitting balance and when attempting to scoot to EOB   Standing balance support: Bilateral upper extremity supported Standing balance-Leahy Scale: Poor Standing balance comment: reliant on UE support and Min-ModA for balance                           ADL either performed or assessed with clinical judgement   ADL Overall ADL's : Needs assistance/impaired                 Upper Body Dressing : Minimal assistance;Bed level Upper Body Dressing Details (indicate cue type and reason): Min A to don new hospital gown Lower Body Dressing: Sitting/lateral leans;Moderate assistance Lower Body Dressing Details (indicate cue type and reason): Mod A overall for socks. Pt able to doff socks sitting in recliner with increased effort, but unable to don without assistance Toilet Transfer: Moderate assistance;+2 for safety/equipment;Stand-pivot;RW;Cueing for safety;Cueing for sequencing Toilet Transfer Details (indicate cue type and reason): simulated to recliner           General ADL Comments: Pt with improved movement in R UE and improved  mobility today. Pt continues to be limited by decreased endurance, strength and standing balance     Vision       Perception     Praxis      Cognition Arousal/Alertness: Awake/alert Behavior During Therapy: WFL for tasks assessed/performed Overall Cognitive Status: Impaired/Different from baseline Area of Impairment:  Orientation;Attention;Memory;Following commands;Safety/judgement;Awareness;Problem solving                 Orientation Level:  (reports birthday at [birth year], rather than [birth year]) Current Attention Level: Sustained Memory: Decreased recall of precautions;Decreased short-term memory Following Commands: Follows one step commands inconsistently Safety/Judgement: Decreased awareness of safety;Decreased awareness of deficits Awareness: Intellectual Problem Solving: Slow processing;Decreased initiation;Difficulty sequencing;Requires verbal cues;Requires tactile cues General Comments: Pt with improved ability to follow commands, pleasant and laughing during session. Limited by decreased safety with cues needed for OOB sequencing during activities        Exercises     Shoulder Instructions       General Comments Pt on 1 L O2, unable to obtain SpO2 reading with pulse ox but no SOB noted. However, pt did require rest breaks due to fatigue    Pertinent Vitals/ Pain       Pain Assessment: Faces Faces Pain Scale: No hurt  Home Living                                          Prior Functioning/Environment              Frequency  Min 2X/week        Progress Toward Goals  OT Goals(current goals can now be found in the care plan section)  Progress towards OT goals: Progressing toward goals  Acute Rehab OT Goals Patient Stated Goal: none stated OT Goal Formulation: Patient unable to participate in goal setting Time For Goal Achievement: 08/28/19 Potential to Achieve Goals: Good ADL Goals Pt Will Perform Eating: with adaptive utensils;sitting;with modified independence Pt Will Perform Grooming: with set-up;sitting Pt Will Perform Upper Body Dressing: with min assist;sitting Pt Will Transfer to Toilet: with max assist;stand pivot transfer;bedside commode Pt/caregiver will Perform Home Exercise Program: Increased strength;Both right and left upper extremity;With  written HEP provided Additional ADL Goal #1: Pt will demonstrate selective attention during ADL task in minimally distracting environment with minimal cues to perform task. Additional ADL Goal #2: Pt will increase to minA for overall ADL assist.  Plan Discharge plan remains appropriate;Frequency remains appropriate    Co-evaluation    PT/OT/SLP Co-Evaluation/Treatment: Yes Reason for Co-Treatment: Complexity of the patient's impairments (multi-system involvement);For patient/therapist safety;To address functional/ADL transfers   OT goals addressed during session: ADL's and self-care      AM-PAC OT "6 Clicks" Daily Activity     Outcome Measure   Help from another person eating meals?: A Little Help from another person taking care of personal grooming?: A Little Help from another person toileting, which includes using toliet, bedpan, or urinal?: A Lot Help from another person bathing (including washing, rinsing, drying)?: A Lot Help from another person to put on and taking off regular upper body clothing?: A Little Help from another person to put on and taking off regular lower body clothing?: A Little 6 Click Score: 16    End of Session Equipment Utilized During Treatment: Gait belt;Rolling walker;Oxygen  OT Visit Diagnosis: Unsteadiness on feet (R26.81);Muscle weakness (generalized) (M62.81);Other symptoms and signs involving  cognitive function Pain - Right/Left: Right Pain - part of body: Arm   Activity Tolerance Patient tolerated treatment well   Patient Left in chair;with call bell/phone within reach;with chair alarm set   Nurse Communication Mobility status        Time: 7939-0300 OT Time Calculation (min): 24 min  Charges: OT General Charges $OT Visit: 1 Visit OT Treatments $Self Care/Home Management : 8-22 mins  Layla Maw, OTR/L   Layla Maw 08/17/2019, 3:14 PM

## 2019-08-17 NOTE — Progress Notes (Addendum)
Daily Progress Note   Patient Name: [name]       Date: 08/17/2019 DOB: 08/12/1945  Age: 74 y.o. MRN#: [id number] Attending Physician: Leeanne Rio, MD Primary Care Physician: Dixie Dials, MD Admit Date: 08/08/2019  Reason for Consultation/Follow-up: Establishing goals of care  Subjective: Patient sleeping - gently woke her but all she will tell me is "I want to go back to sleep"  Length of Stay: 5  Current Medications: Scheduled Meds:  . apixaban  5 mg Oral BID  . feeding supplement (ENSURE ENLIVE)  237 mL Oral BID BM  . hydroxychloroquine  200 mg Oral BID  . leflunomide  10 mg Oral Daily  . levothyroxine  50 mcg Oral Q0600  . metoprolol tartrate  12.5 mg Oral BID  . predniSONE  5 mg Oral Daily  . rosuvastatin  20 mg Oral q1800    Continuous Infusions:   PRN Meds: acetaminophen **OR** acetaminophen, cycloSPORINE, HYDROcodone-acetaminophen, polyethylene glycol  Physical Exam Constitutional:      Comments: Sleeping, wakes easily  Pulmonary:     Effort: Pulmonary effort is normal. No respiratory distress.  Musculoskeletal:     Right lower leg: Edema present.     Left lower leg: Edema present.  Skin:    General: Skin is warm and dry.  Neurological:     Comments: UTA             Vital Signs: BP 121/72 (BP Location: Right Wrist)   Pulse 66   Temp 98 F (36.7 C)   Resp 12   Ht _0  (1.6 m)   Wt 118 kg   SpO2 98%   BMI 46.08 kg/m  SpO2: SpO2: 98 % O2 Device: O2 Device: Room Air O2 Flow Rate: O2 Flow Rate (L/min): 2 L/min  Intake/output summary:   Intake/Output Summary (Last 24 hours) at 08/17/2019 1504 Last data filed at 08/17/2019 4142 Gross per 24 hour  Intake --  Output 1300 ml  Net -1300 ml   LBM: Last BM Date: 08/16/19 Baseline Weight: Weight:  130.6 kg Most recent weight: Weight: 118 kg       Palliative Assessment/Data: PPS  30%      Patient Active Problem List   Diagnosis Date Noted  . Acute respiratory failure with hypoxia ([place])   . Lung infiltrate   . Palliative care encounter   . Acute on chronic combined systolic and diastolic CHF (congestive heart failure) ([place]) 08/09/2019  . Contamination of blood culture 08/09/2019  . Hypoxia 08/08/2019  . Mitral valve disorder   . Hypertension, essential   . CVA (cerebral vascular accident) ([place]) 08/01/2019  . Pain due to onychomycosis of toenails of both feet 08/31/2018  . Oxygen dependent   . Fever   . Decreased range of motion of left shoulder   . Drug-induced neutropenia ([place])   . Other pancytopenia ([place])   . Methotrexate toxicity   . 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 Acute diverticulitis   . Ventral hernia without obstruction or gangrene   . Cellulitis of leg, left   . Morbid obesity ([place])   . Diverticulitis 06/18/2017  . 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 S/P revision  of total knee 10/24/2015    Palliative Care Assessment & Plan   HPI: 74 y.o. female  with past medical history of multiple strokes, right sided HF, pulmonary HTN with cor pulmonale, small PFO, rheumatoid arthritis on mexotrexate, chronic pain, morbid obesity who was admitted on 08/08/2019 with right sided weakness and hypoxic respiratory failure (in the 80s on room air).  Work up reveals a moderate to large left MCA stroke as well as other small strokes.  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   She is not a candidate for surgical intervention due to her other co-morbidities.  Of note the patient also has acute renal failure and a degree of acute right sided heart failure.  PMT was consulted for goals of care.  Family has requested a conversation with CT surgery prior to meeting with PMT   Assessment: Follow up with patient's  daughter [name]. Clinical update provided. All questions addressed. [name] tells me she is still attempting to decide between SNF and CIR. She is not sure yet what is best option for her mother.  We reviewed patient's conversation with other palliative care provider, [name]. We reviewed that patient had expressed a desire for DNR. [name] disagrees with this - she tells me she would certainly want resuscitative efforts for her mother and she feels her mother would want this as well - she does not think her mother understood what she was saying.  Discussed with [name] the importance of continued conversation with Ms. Laidler and the medical providers regarding overall plan of care and treatment options, ensuring decisions are within the context of the patient's values and GOCs.    [name] tells me she is overwhelmed with current situation and decisions she is faced with. Emotional support provided.   [name] has our team's contact information and plans to reach out with any further concerns.   Recommendations/Plan:  Full code/full scope per daughter   Daughter still considering SNF vs CIR  PMT will shadow chart  Goals of Care and Additional Recommendations:  Code Status:  Full code  Prognosis:   Unable to determine  Discharge Planning:  To Be Determined  Care plan was discussed with patient's daughter  Thank you for allowing the Palliative Medicine Team to assist in the care of this patient.   Total Time 20 minutes Prolonged Time Billed  no       Greater than 50%  of this time was spent counseling and coordinating care related to the above assessment and plan.  Debra Burrow, DNP, [facility] Palliative Medicine Team Team Phone # [phone]  Pager [phone]

## 2019-08-17 NOTE — TOC Progression Note (Signed)
Transition of Care [facility]) - Progression Note    Patient Details  Name: [name] MRN: [id number] Date of Birth: April 20, 1945  Transition of Care [facility]) CM/SW [place], LCSW Phone Number: 08/17/2019, 2:40 PM  Clinical Narrative:    CSW spoke with patient's daughter and answered questions about patient returning to SNF versus CIR. She will make decision soon and contact CIR. She requested a letter from the MD stating that she needed to be able to stay with her mother to assist with care at her apartment as the complex is currently not allowing her to; CSW sent message to MD.    Expected Discharge Plan: Skilled Nursing Facility Barriers to Discharge: Continued Medical Work up  Expected Discharge Plan and Services Expected Discharge Plan: [place] arrangements for the past 2 months: Apartment Expected Discharge Date: 08/10/19                                     Social Determinants of Health (SDOH) Interventions    Readmission Risk Interventions No flowsheet data found.

## 2019-08-18 LAB — BASIC METABOLIC PANEL
Anion gap: 12 (ref 5–15)
BUN: 44 mg/dL — ABNORMAL HIGH (ref 8–23)
CO2: 25 mmol/L (ref 22–32)
Calcium: 9.5 mg/dL (ref 8.9–10.3)
Chloride: 99 mmol/L (ref 98–111)
Creatinine, Ser: 1.53 mg/dL — ABNORMAL HIGH (ref 0.44–1.00)
GFR calc Af Amer: 39 mL/min — ABNORMAL LOW (ref >60)
GFR calc non Af Amer: 34 mL/min — ABNORMAL LOW (ref >60)
Glucose, Bld: 101 mg/dL — ABNORMAL HIGH (ref 70–99)
Potassium: 3.9 mmol/L (ref 3.5–5.1)
Sodium: 136 mmol/L (ref 135–145)

## 2019-08-18 NOTE — Progress Notes (Signed)
Inpatient Rehabilitation Admissions Coordinator  [place] MD has requesting a peer to peer with treating MD. I have notified Dr. Shiela Mayer to complete the request by 2 pm today. I have left voicemail for daughter, [name] to contact me to discuss her caregiver support that will be needed for CIR vs SNF placement.  Danne Baxter, RN, MSN Rehab Admissions Coordinator [phone] 08/18/2019 11:01 AM

## 2019-08-18 NOTE — TOC Progression Note (Signed)
Transition of Care [facility]) - Progression Note    Patient Details  Name: PHOENYX MELKA MRN: [id number] Date of Birth: 11/11/1945  Transition of [facility]) CM/SW [place], Naomi Phone Number: 08/18/2019, 2:52 PM  Clinical Narrative:   CSW notified by rehab admissions that patient has been denied for CIR by insurance, daughter would like [facility]. CSW confirmed bed availability at [place] and sent request for SNF to [facility].    Expected Discharge Plan: [place] Barriers to Discharge: Continued Medical Work up  Expected Discharge Plan and Services Expected Discharge Plan: [place] arrangements for the past 2 months: Apartment Expected Discharge Date: 08/10/19                                     Social Determinants of Health (SDOH) Interventions    Readmission Risk Interventions No flowsheet data found.

## 2019-08-18 NOTE — Progress Notes (Signed)
Family Medicine Teaching Service Daily Progress Note Intern Pager: [phone]  Patient name: UNIQUE SILLAS Medical record number: [id number] Date of birth: Dec 23, 1945 Age: 74 y.o. Gender: female  Primary Care Provider: Dixie Dials, MD Consultants: Neurology and CVTS Code Status: FULL  Pt Overview and Major Events to Date:  Admitted 7/12.  New CVA 7/16  Assessment and Plan: Calisa Luckenbaugh Hooksis a 74 y.o.femalepresenting after a positive result on Blood culture post discharge to a SNF.PMH is significant for CVA,hyperlipidemia, hypertension, anemia, and rheumatoid arthritis.  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 Patient is able to understand most simple commands and has improvement in control of her right side. Expressive ability is stable with yesterday.  SLP saw patient and recommends SNF.  Palliative care and daughter spoke about patient;s desire to be DNR, daughter does not feel this appropriate and believes that the patient is not fully aware of what she said. She believes that her mother would not want to be DNR. Addressed this with patient today, patient wants to be FULL code. PT and OT continues to recommend CIR. -Neurology on board, appreciate recommendations -CVTS consulted, patient not a good candidate for surgery. - Discontinue cardiac monitorin -Continue Eliquis -Frequent neuro checks -PT/OT -Appreciate speech recommendations and assistance with aphasia  -Continue Crestor --Palliative has initiated conversation with the patient's daughter - Peer-to peer for CIR today, 7/22 -Call daughter with updates  Acute hypoxic respiratory failure, in setting of cor pulmonaleresolved On room airall night.Patient has AKI. Cr has decreased from 1.57> 1.53. 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 Has been present long-term, however she  has not followed upoutpatient -Oxygen therapyPRN   AKIon CKD YQ:IHKVQQVZD. Creatininedownto1.61fom1.57. -Continue to monitor daily  -Strict I and O's discontinued -Monitor BMP discontinued  RDVT: Stable. Age-indeterminate DVT found in right peroneal veins on 7/9. -Eliquis was restarted 08/15/2019  Previous CorynebacteriumBcxcontaminant: Resolved. Patient was discharged to SNFon 7/10with cultures pending; however they came back positive for corynebacterium,felt to be contaminant. Repeat culture has shown no growth.  HGLO:VFIEPPI stable. BP 121/72-150/62 -Will continue low-dose home metoprolol for now -Monitor BP  Rheumatoid arthritis- stable, chronic Patient takes prednisone5 mg, leflunomide, and hydroxychloroquine.  - Continue home meds - Protonix  Hyperlipidemia- stable and chronic Patient is post- CVA and has a hx HLD. Patient takes rosuvastatin at home. - Continue home meds  Chronic pain syndrome - Continue homeNorco 10-325TID PRN  Hypothyroidism- Chronic and stable Patient takes levothyroxine. - Continue home medication    FEN/GI: Soft PPx: Eliquis  Disposition: SNF vs CIR  Subjective:  SW spoke with the daughter still trying to determine if patient will go to CIR vs SNF. Patient's daughter will make a decision soon and contact CIR. Daughter has requested a letter from our team stating that she needs to be able to stay with her mother to assist with care as her apt complex is currently no allowing her to do so. Palliative care also talked with her daughter and the daughter felt the patient should not be DNR and that the patient was not fully aware of what she was requesting when saying she wanted to be DNR. Palliative care suggested that the patient and daughter talk about overall treatment plan and options. No overnight events. patient wants to be FULL code.  Objective: Temp:  [98 F (36.7 C)-98.5 F (36.9 C)] 98.1 F (36.7 C)  (07/21 2231) Pulse Rate:  [66-77] 77 (07/21 2231) Resp:  [  12-15] 15 (07/21 2231) BP: (121-150)/(62-72) 150/62 (07/21 2231) SpO2:  [92 %-100 %] 92 % (07/21 2231) Physical Exam: General: Resting in bed eating, NAD Cardiovascular: Regular heart rate and rythym Respiratory: Clear and equal to bil. Auscl., No extra work of breathing noted Abdomen: Ostomy bag leaking, patient was aware, no pain to palpation Extremities: Non-pitting edema in the lower extremities. 5/5 strength in the BLE, 4/5 In RUE, 5/5 LUE, 2/5 grip in R wrist Continues to have global aphasia stable with yesterdays exam. Laboratory: Recent Labs  Lab 08/15/19 0954 08/16/19 0641 08/17/19 0257  WBC 8.6 7.4 6.8  HGB 11.8* 11.2* 11.0*  HCT 39.7 37.6 37.2  PLT 286 243 222   Recent Labs  Lab 08/13/19 0420 08/14/19 0400 08/16/19 0641 08/17/19 0257 08/18/19 0440  NA 137   < > 135 134* 136  K 5.1   < > 4.2 4.2 3.9  CL 99   < > 99 98 99  CO2 25   < > _0 BUN 25*   < > 49* 45* 44*  CREATININE 1.42*   < > 1.75* 1.57* 1.53*  CALCIUM 9.4   < > 9.3 9.4 9.5  PROT 7.3  --   --   --   --   BILITOT 1.3*  --   --   --   --   ALKPHOS 44  --   --   --   --   ALT 17  --   --   --   --   AST 37  --   --   --   --   GLUCOSE 84   < > 89 89 101*   < > = values in this interval not displayed.      Imaging/Diagnostic Tests: None new  Freida Busman, MD 08/18/2019, 6:13 AM PGY-1, [place] Intern pager: [phone], text pages welcome

## 2019-08-18 NOTE — Progress Notes (Signed)
Physical Therapy Treatment Patient Details Name: [name] MRN: [id number] DOB: 25-Jan-1946 Today's Date: 08/18/2019    History of Present Illness Pt is 74 y.o. female with PMH of HTN, HLD, CAD, anemia, RA, TKR (R), morbid obesity, decreased ROM in L shoulder, Presented to ED with R hand weakness. MRI reveals small acute cortical/subcortical infarct affecting the left pre and post central gyri. Pt discharged on 7/10 to SNF, but was later found to have a positive blood culture for corynebacterium and readmitted on 7/12.    PT Comments    Pt was laying upright in bed to begin treatment session. She appeared to be fatigued and said the word "sleepy" several times. Did not want to transfer into chair or try standing this afternoon. Patient required MinA for supine to sit transfer, verbal cueing, and tactile cues to help her sit up. While seated she was provided with MinA to remain upright for safety to avoid falls. Attempt to obtain SpO2 and HR value with pulse ox while seated, however was not able to obtain a value. Patient transferred back to supine in bed with MinA and was scooted up superiorly in bed using a two person assist with the chuck pads. Patient was able to roll both directions in bed with MinA to arrange bedding beneath her to maintain skin integrity. At end of session, she was left laying comfortably in bed with all lines attached and all needs within reach. Contacted nursing aide after session via phone to communicate patient's desire to eat lunch and need of assistance to do so safely.    Follow Up Recommendations  SNF;Supervision/Assistance - 24 hour     Equipment Recommendations  Rolling walker with 5" wheels    Recommendations for Other Services       Precautions / Restrictions Precautions Precautions: Fall Precaution Comments: watch SpO2, expressive aphasia    Mobility  Bed Mobility Overal bed mobility: Needs Assistance Bed Mobility: Supine to Sit;Sit to  Supine;Rolling Rolling: Min assist   Supine to sit: Min assist Sit to supine: Min assist   General bed mobility comments: Min A for rolling, scooting, supine <> sit.  Transfers                    Ambulation/Gait                 Stairs             Wheelchair Mobility    Modified Rankin (Stroke Patients Only)       Balance Overall balance assessment: Needs assistance Sitting-balance support: Feet supported Sitting balance-Leahy Scale: Fair Sitting balance - Comments: Static sitting balance fair, but Min A to maintain safety during dynamic sitting balance and when attempting to scoot to EOB                                    Cognition Arousal/Alertness: Awake/alert Behavior During Therapy: WFL for tasks assessed/performed Overall Cognitive Status: Impaired/Different from baseline Area of Impairment: Orientation;Attention;Memory;Following commands;Safety/judgement;Awareness;Problem solving                   Current Attention Level: Sustained Memory: Decreased recall of precautions;Decreased short-term memory Following Commands: Follows one step commands inconsistently Safety/Judgement: Decreased awareness of safety;Decreased awareness of deficits Awareness: Intellectual Problem Solving: Slow processing;Decreased initiation;Difficulty sequencing;Requires verbal cues;Requires tactile cues General Comments: Pt with improved ability to follow commands, pleasant and laughing during session.  Limited by decreased safety with cues needed for OOB sequencing during activities. Was very tired during today's session.      Exercises      General Comments General comments (skin integrity, edema, etc.): pt on room air. unable to obtain SpO2 value while seating at EOB. felt fatigued throughout the session.      Pertinent Vitals/Pain Faces Pain Scale: Hurts little more Pain Location: Experiencing pain with bed mobility. Appeared her back  was bothering her, but was not able to articulate the source of her pain. Pain Descriptors / Indicators: Grimacing;Discomfort Pain Intervention(s): Limited activity within patient's tolerance;Monitored during session    Home Living                      Prior Function            PT Goals (current goals can now be found in the care plan section) Acute Rehab PT Goals Patient Stated Goal: none stated PT Goal Formulation: With patient Time For Goal Achievement: 08/24/19 Potential to Achieve Goals: Good Progress towards PT goals: Progressing toward goals    Frequency    Min 3X/week      PT Plan Discharge plan needs to be updated    Co-evaluation     PT goals addressed during session: Balance;Mobility/safety with mobility        AM-PAC PT "6 Clicks" Mobility   Outcome Measure  Help needed turning from your back to your side while in a flat bed without using bedrails?: A Little Help needed moving from lying on your back to sitting on the side of a flat bed without using bedrails?: A Little Help needed moving to and from a bed to a chair (including a wheelchair)?: A Lot Help needed standing up from a chair using your arms (e.g., wheelchair or bedside chair)?: A Lot Help needed to walk in hospital room?: A Lot Help needed climbing 3-5 steps with a railing? : Total 6 Click Score: 13    End of Session Equipment Utilized During Treatment: Gait belt Activity Tolerance: Patient limited by fatigue Patient left: in bed;with call bell/phone within reach;with bed alarm set Nurse Communication: Other (comment) (contacted nurse tech via phone to help pt eat lunch after session) PT Visit Diagnosis: Other abnormalities of gait and mobility (R26.89);Muscle weakness (generalized) (M62.81) Hemiplegia - Right/Left: Right Hemiplegia - dominant/non-dominant: Dominant Hemiplegia - caused by: Cerebral infarction     Time: 0335-3317 PT Time Calculation (min) (ACUTE ONLY): 16  min  Charges:  $Therapeutic Activity: 8-22 mins                        Livingston Diones, SPT 08/18/2019, 3:45 PM

## 2019-08-18 NOTE — Progress Notes (Signed)
Inpatient Rehabilitation Admissions Coordinator  I have received a denial form [employer] for Cir admit. I contacted her daughter, [name] by phone and she is aware. She requests preference for [facility] on [address]. I have notified acute team and TOC , Liz SW. We will sign off at this time.  Danne Baxter, RN, MSN Rehab Admissions Coordinator [phone] 08/18/2019 2:35 PM

## 2019-08-18 NOTE — Plan of Care (Signed)

## 2019-08-19 DIAGNOSIS — L8990 Pressure ulcer of unspecified site, unspecified stage: Secondary | ICD-10-CM | POA: Insufficient documentation

## 2019-08-19 LAB — RENAL FUNCTION PANEL
Albumin: 2.6 g/dL — ABNORMAL LOW (ref 3.5–5.0)
Anion gap: 10 (ref 5–15)
BUN: 46 mg/dL — ABNORMAL HIGH (ref 8–23)
CO2: 28 mmol/L (ref 22–32)
Calcium: 9.8 mg/dL (ref 8.9–10.3)
Chloride: 101 mmol/L (ref 98–111)
Creatinine, Ser: 1.6 mg/dL — ABNORMAL HIGH (ref 0.44–1.00)
GFR calc Af Amer: 37 mL/min — ABNORMAL LOW (ref >60)
GFR calc non Af Amer: 32 mL/min — ABNORMAL LOW (ref >60)
Glucose, Bld: 98 mg/dL (ref 70–99)
Phosphorus: 3.5 mg/dL (ref 2.5–4.6)
Potassium: 4.1 mmol/L (ref 3.5–5.1)
Sodium: 139 mmol/L (ref 135–145)

## 2019-08-19 MED ORDER — ACETAMINOPHEN 650 MG RE SUPP
650.0000 mg | Freq: Three times a day (TID) | RECTAL | Status: DC
  Filled 2019-08-19: qty 1

## 2019-08-19 MED ORDER — ACETAMINOPHEN 500 MG PO TABS
1000.0000 mg | ORAL_TABLET | Freq: Three times a day (TID) | ORAL | Status: DC
  Administered 2019-08-19 – 2019-08-23 (×14): 1000 mg via ORAL
  Filled 2019-08-19 (×14): qty 2

## 2019-08-19 NOTE — Progress Notes (Signed)
Family Medicine Teaching Service Daily Progress Note Intern Pager: [phone]  Patient name: [name] Medical record number: [id number] Date of birth: [birth year] Age: 74 y.o. Gender: female  Primary Care Provider: Dixie Dials, MD Consultants: Neurology and CVTS Code Status: FULL  Pt Overview and Major Events to Date:  Admitted 7/12.  New CVA 7/16  Assessment and Plan: [place] Nonaka Hooksis a 74 y.o.femalepresenting after a positive result on Blood culture post discharge to a SNF.PMH is significant for CVA,hyperlipidemia, hypertension, anemia, and rheumatoid arthritis.  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  Patient speaking clearly, answering all questions appropriately.  4/5 right grip strength, otherwise 5/5 strength RUE and RLE.  Currently medically stable for discharge.  Has bed request in for [facility], but waiting for insurance approval. -Neurology consulted, signed off, f/u outpatient -CVTS consulted, patient not a good candidate for surgery. -Continue Eliquis - Tylenol scheduled -PT/OT: SNF -Appreciate speech recommendations and assistance with aphasia  -Continue Crestor --Palliative consulted, appreciate recs, will remain Full Code - Medically stable for d/c to SNF, awaiting insurance approval  Acute hypoxic respiratory failure, in setting of cor pulmonaleresolved O2 sats remain stable on RA overnight.   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 Has been present long-term, however she has not followed upoutpatient.  Stable and no evidence of fluid overload at present.  Trace BLE pitting edema. -Oxygen therapyPRN   AKIon CKD II: Resolved. Cr stable at 1.62 this AM. -Continue to monitor daily    RDVT: Stable. Age-indeterminate DVT found in right peroneal veins on 7/9. -Eliquis was restarted 7/19/2021per neuro Eliquis is recommended for 3 months then ASA alone.  Previous CorynebacteriumBcxcontaminant: Resolved. Patient was discharged to SNFon 7/10with cultures pending; however they came back positive for corynebacterium,felt to be contaminant. Repeat culture has shown no growth.  GHW:EXHBZJI, stable. Home meds: lopressor 12.25m BID.  Most recent BP 144/56.  -Will continue low-dose home metoprolol  -Monitor BP  Rheumatoid arthritis- stable, chronic Patient takes prednisone5 mg, leflunomide, and hydroxychloroquine.  - Continue home meds - Protonix  Hyperlipidemia- stable and chronic Patient is post- CVA and has a hx HLD. Patient takes rosuvastatin at home. - Continue home meds  Chronic pain syndrome - Continue homeNorco 10-325TID PRN  Hypothyroidism- Chronic and stable Patient takes levothyroxine. - Continue home medication   FEN/GI: Soft PPx: Eliquis  Disposition: SNF pending insurance approval, medically stable for discharge  Subjective:  Patient reports that she is doing well this AM.  Denies complaints.  States that her speech is improving.  Objective: Temp:  [97.7 F (36.5 C)-99.5 F (37.5 C)] 99 F (37.2 C) (07/23 2244) Pulse Rate:  [74-81] 80 (07/23 2244) Resp:  [17-18] 18 (07/23 2244) BP: (123-144)/(42-58) 144/56 (07/23 2244) SpO2:  [93 %-100 %] 94 % (07/23 2244)  Physical Exam:  General: 74y.o. female in NAD Cardio: RRR  Lungs: CTAB, no wheezing, no rhonchi, no crackles, no IWOB on RA Skin: warm and dry Extremities: Trace BLE edema Neuro: clear speech, answers questions appropriately, 4/5 right hand grip strength, 5/5 RUE/RLE, 5/5 LUE/LLE   Laboratory: Recent Labs  Lab 08/16/19 0641 08/17/19 0257 08/20/19 0350  WBC 7.4 6.8 8.3  HGB 11.2* 11.0* 11.2*  HCT 37.6 37.2 37.5  PLT 243 222 267   Recent Labs  Lab  08/18/19 0440  08/19/19 0702 08/20/19 0350  NA 136 139 139  K 3.9 4.1 4.4  CL 99 101 102  CO2 _0 BUN 44* 46* 51*  CREATININE 1.53* 1.60* 1.67*  CALCIUM 9.5 9.8 9.8  GLUCOSE 101* 98 94      Imaging/Diagnostic Tests:   Cleophas Dunker, DO 08/20/2019, 5:26 AM PGY-3, [place] Intern pager: [phone], text pages welcome

## 2019-08-19 NOTE — Progress Notes (Addendum)
Family Medicine Teaching Service Daily Progress Note Intern Pager: [phone]  Patient name: [name] Medical record number: [id number] Date of birth: 1945/10/16 Age: 74 y.o. Gender: female  Primary Care Provider: Dixie Dials, MD Consultants: Neurology and CVTS Code Status: FULL  Pt Overview and Major Events to Date:  Admitted 7/12.  New CVA 7/16  Assessment and Plan: Debra Friesen Hooksis a 74 y.o.femalepresenting after a positive result on Blood culture post discharge to a SNF.PMH is significant for CVA,hyperlipidemia, hypertension, anemia, and rheumatoid arthritis.  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 Patient is able to understand most simple commandsand has some control of her right side, stable to yesterday.Expressive ability is stable with yesterday. Patient appeared to be wincing, but reported that she was not in pain with movement of both arms. There is concern that the patient cannot accurately communicate her pain.  Patient was refusing breakfast saying that she was not hungry; however on exam her GI was hyperactive sounding in regard to her baseline. Patient usually eats breakfast, will continue to monitor intake. Patient is alert to person and day. Peer-to-peer discussion held 08/18/2019 to recommend patient for CIR, request was denied. Patient's daughter was contacted by PM&R and daughter chose [facility] for SNF placement. CSW confirmed there is a bed availability a request has been sent to insurance for SNF. -Neurology on board, appreciate recommendations -CVTS consulted, patient not a good candidate for surgery. -Continue Eliquis - Tylenol scheduled -Frequent neuro checks -PT/OT -Appreciate speech recommendations and assistance with aphasia  -Continue Crestor --Palliative has initiated conversation with the patient's daughter -Call daughter with updates  Acute hypoxic respiratory failure, in setting of cor  pulmonaleresolved On room airall night O2 sat 96-99. Non pitting edema of the bilateral legs. Patient does not appear in fluid overload. Lungs sound clear on exam.   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 Has been present long-term, however she has not followed upoutpatient -Oxygen therapyPRN   AKIon CKD OV:FIEPPIRJJ. Creatininestable  1.60. -Continue to monitor daily   RDVT: Stable. Age-indeterminate DVT found in right peroneal veins on 7/9. -Eliquis was restarted 7/19/2021per neuro Eliquis is recommended for 3 months then ASA alone.  Previous CorynebacteriumBcxcontaminant: Resolved. Patient was discharged to SNFon 7/10with cultures pending; however they came back positive for corynebacterium,felt to be contaminant. Repeat culture has shown no growth.  OAC:ZYSAYTK, stable. BP 106/54-127/56 -Will continue low-dose home metoprolol for now -Monitor BP  Rheumatoid arthritis- stable, chronic Patient takes prednisone5 mg, leflunomide, and hydroxychloroquine.  - Continue home meds - Protonix  Hyperlipidemia- stable and chronic Patient is post- CVA and has a hx HLD. Patient takes rosuvastatin at home. - Continue home meds  Chronic pain syndrome - Continue homeNorco 10-325TID PRN  Hypothyroidism- Chronic and stable Patient takes levothyroxine. - Continue home medication     FEN/GI: Soft PPx: Eliquis  Disposition: SNF  Subjective:  No overnight events. Person is oriented to self and day. Patient has a decreased appetite and appeared to be in some pain when lifting her arms; however she reports multiple times that she is not in pain and is just wincing for no apparent reason and can't help it. NO SOB and no chest pain.  Objective: Temp:  [97.9 F (36.6 C)-98.4 F (36.9 C)] 98.3 F (36.8 C) (07/23 0400) Pulse Rate:  [72-91] 76 (07/23 0400) Resp:  [12-17] 15 (07/23 0400) BP: (106-127)/(51-67) 106/54 (07/23  0400) SpO2:  [96 %-99 %] 98 % (07/23 0400) Physical Exam: General: Resting in bed,  refusing breakfast Cardiovascular: Regular rate and rhythm, murmur  At the PV area is decreased Respiratory: Clear and equal to bilateral auscl Abdomen: Ostomy bag is full, No pain to palpation, hyperactive bowel sounds from her baseline Extremities: Distal pulses intact, non pitting edema bilaterally 1/2 R grip, 2/2 L grip, 4/5 RLE, 5/5 LLE  Laboratory: Recent Labs  Lab 08/15/19 0954 08/16/19 0641 08/17/19 0257  WBC 8.6 7.4 6.8  HGB 11.8* 11.2* 11.0*  HCT 39.7 37.6 37.2  PLT 286 243 222   Recent Labs  Lab 08/13/19 0420 08/14/19 0400 08/16/19 0641 08/17/19 0257 08/18/19 0440  NA 137   < > 135 134* 136  K 5.1   < > 4.2 4.2 3.9  CL 99   < > 99 98 99  CO2 25   < > _0 BUN 25*   < > 49* 45* 44*  CREATININE 1.42*   < > 1.75* 1.57* 1.53*  CALCIUM 9.4   < > 9.3 9.4 9.5  PROT 7.3  --   --   --   --   BILITOT 1.3*  --   --   --   --   ALKPHOS 44  --   --   --   --   ALT 17  --   --   --   --   AST 37  --   --   --   --   GLUCOSE 84   < > 89 89 101*   < > = values in this interval not displayed.      Imaging/Diagnostic Tests:   Freida Busman, MD 08/19/2019, 6:10 AM PGY-1, [place] Intern pager: [phone], text pages welcome

## 2019-08-19 NOTE — TOC Progression Note (Signed)
Transition of Care [facility]) - Progression Note    Patient Details  Name: [name] MRN: [id number] Date of Birth: [birth year]  Transition of Care [facility]) CM/SW [place], [place] Phone Number: 08/19/2019, 4:28 PM  Clinical Narrative:     CSW follow-up on insurance authorization. Currently still pending with [facility].   Expected Discharge Plan: [place] Barriers to Discharge: Continued Medical Work up  Expected Discharge Plan and Services Expected Discharge Plan: [place] arrangements for the past 2 months: Apartment Expected Discharge Date: 08/10/19                                     Social Determinants of Health (SDOH) Interventions    Readmission Risk Interventions No flowsheet data found.

## 2019-08-20 LAB — RENAL FUNCTION PANEL
Albumin: 2.6 g/dL — ABNORMAL LOW (ref 3.5–5.0)
Anion gap: 9 (ref 5–15)
BUN: 51 mg/dL — ABNORMAL HIGH (ref 8–23)
CO2: 28 mmol/L (ref 22–32)
Calcium: 9.8 mg/dL (ref 8.9–10.3)
Chloride: 102 mmol/L (ref 98–111)
Creatinine, Ser: 1.67 mg/dL — ABNORMAL HIGH (ref 0.44–1.00)
GFR calc Af Amer: 35 mL/min — ABNORMAL LOW (ref >60)
GFR calc non Af Amer: 30 mL/min — ABNORMAL LOW (ref >60)
Glucose, Bld: 94 mg/dL (ref 70–99)
Phosphorus: 3.6 mg/dL (ref 2.5–4.6)
Potassium: 4.4 mmol/L (ref 3.5–5.1)
Sodium: 139 mmol/L (ref 135–145)

## 2019-08-20 LAB — CBC
HCT: 37.5 % (ref 36.0–46.0)
Hemoglobin: 11.2 g/dL — ABNORMAL LOW (ref 12.0–15.0)
MCH: 25.3 pg — ABNORMAL LOW (ref 26.0–34.0)
MCHC: 29.9 g/dL — ABNORMAL LOW (ref 30.0–36.0)
MCV: 84.8 fL (ref 80.0–100.0)
Platelets: 267 10*3/uL (ref 150–400)
RBC: 4.42 MIL/uL (ref 3.87–5.11)
RDW: 14.2 % (ref 11.5–15.5)
WBC: 8.3 10*3/uL (ref 4.0–10.5)
nRBC: 0 % (ref 0.0–0.2)

## 2019-08-20 NOTE — TOC Progression Note (Signed)
Transition of Care [facility]) - Progression Note    Patient Details  Name: [name] MRN: [id number] Date of Birth: Nov 01, 1945  Transition of Care [facility]) CM/SW [place], [place] Phone Number: 08/20/2019, 11:05 AM  Clinical Narrative:    CSW followed up with [facility] to check on the status of insurance authorization. CSW informed a new insurance authorization needs to be started considering the continued stay was through 7/20. CSW started new insurance authorization and faxed updated clinicals. New reference [id number].   Expected Discharge Plan: [place] Barriers to Discharge: Continued Medical Work up  Expected Discharge Plan and Services Expected Discharge Plan: [place] arrangements for the past 2 months: Apartment Expected Discharge Date: 08/10/19                                     Social Determinants of Health (SDOH) Interventions    Readmission Risk Interventions No flowsheet data found.

## 2019-08-21 NOTE — Progress Notes (Signed)
Family Medicine Teaching Service Daily Progress Note Intern Pager: [phone]  Patient name: [name] Medical record number: [id number] Date of birth: [birth year] Age: 74 y.o. Gender: female  Primary Care Provider: Dixie Dials, MD Consultants: Neurology and CVTS Code Status: FULL  Pt Overview and Major Events to Date:  Admitted 7/12.  New CVA 7/16  Assessment and Plan: Debra Sokolow Hooksis a 74 y.o.femalepresenting after a positive result on Blood culture post discharge to a SNF.PMH is significant for CVA,hyperlipidemia, hypertension, anemia, and rheumatoid arthritis.  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  Patient speaking clearly, but was not oriented to person, place, time nor situation today. She responded to her name but could give her name today as she has been able to in the past.  Currently medically stable for discharge.  Has bed request in for [facility], but waiting for insurance re-authorization. -Neurology consulted, signed off, f/u outpatient -CVTS consulted, patient not a good candidate for surgery. -Continue Eliquis - Tylenol scheduled -PT/OT: SNF -Appreciate speech recommendations and assistance with aphasia  -Continue Crestor --Palliative consulted, appreciate recs, will remain Full Code - Medically stable for d/c to SNF, awaiting insurance approval  Acute hypoxic respiratory failure, in setting of cor pulmonaleresolved O2 sats remain stable on RA overnight.   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 Has been present long-term, however she has not followed upoutpatient.  Stable and no evidence of fluid overload at present.  BLE non pitting edema. No  oxygen requirement past 24hrs. -Oxygen therapyPRN   AKIon CKD II: Stable. Most recent Cr 1.67. Baseline appears to be around 1.5. +/- .1. -Continue to monitor daily   RDVT: Stable. Age-indeterminate DVT found in right peroneal veins on 7/9. -Eliquis was restarted 7/19/2021per neuro Eliquis is recommended for 3 months then ASA alone.  Previous CorynebacteriumBcxcontaminant: Resolved. Patient was discharged to SNFon 7/10with cultures pending; however they came back positive for corynebacterium,felt to be contaminant. Repeat culture has shown no growth.  BBC:WUGQBVQ, stable. Home meds: lopressor 12.60m BID.  Most recent BP 114/46.  -Will continue low-dose home metoprolol  -Monitor BP  Rheumatoid arthritis- stable, chronic Patient takes prednisone5 mg, leflunomide, and hydroxychloroquine.  - Continue home meds - Protonix  Hyperlipidemia- stable and chronic Patient is post- CVA and has a hx HLD. Patient takes rosuvastatin at home. - Continue home meds  Chronic pain syndrome - Continue homeNorco 10-325TID PRN  Hypothyroidism- Chronic and stable Patient takes levothyroxine. - Continue home medication   FEN/GI: Soft  PPx: Eliquis Disposition: SNF pending insurance approval, medically stable for discharge  Subjective:  Patient had no overnight events. Patient reports no pain. At this time awaiting reauthorization for SNF.  Objective: Temp:  [98.7 F (37.1 C)-99.6 F (37.6 C)] 98.7 F (37.1 C) (07/24 2307) Pulse Rate:  [72-83] 72 (07/24 2307) Resp:  [18-20] 20 (07/24 2307) BP: (114-127)/(46-67) 114/46 (07/24 2307) SpO2:  [94 %-95 %] 95 % (07/24 2307) Physical Exam: General: Resting comfortably with genial mood Cardiovascular: Regular rate and rythym Respiratory: Clear and equal Bil, normal work of breathing Abdomen: ostomy bag clean, normoactive bowel sounds, no pain to palpation Extremities: Non-pitting edema BLE. Distal pulses intact Neuro:  Unable to test CN 11-12 due to receptive aphasia. CN 3-10 intact, no focal deficits. Patient is not oriented  to person, place, time, nor situation. 3/5 R hand grip, 5/5 L hand grip 3/5 R arm, 5/5 L arm, 5/5 R leg, 5/5 L leg Unable to test dorsi and plantarflexion due to receptive aphasia. Continues to have expressive aphasia as well. Somewhat worse on exam today.  Laboratory: Recent Labs  Lab 08/16/19 0641 08/17/19 0257 08/20/19 0350  WBC 7.4 6.8 8.3  HGB 11.2* 11.0* 11.2*  HCT 37.6 37.2 37.5  PLT 243 222 267   Recent Labs  Lab 08/18/19 0440 08/19/19 0702 08/20/19 0350  NA 136 139 139  K 3.9 4.1 4.4  CL 99 101 102  CO2 _0 BUN 44* 46* 51*  CREATININE 1.53* 1.60* 1.67*  CALCIUM 9.5 9.8 9.8  GLUCOSE 101* 98 94      Imaging/Diagnostic Tests:   Freida Busman, MD 08/21/2019, 6:44 AM PGY-1, [place] Intern pager: [phone], text pages welcome

## 2019-08-22 LAB — CBC
HCT: 36.9 % (ref 36.0–46.0)
Hemoglobin: 11.1 g/dL — ABNORMAL LOW (ref 12.0–15.0)
MCH: 25.5 pg — ABNORMAL LOW (ref 26.0–34.0)
MCHC: 30.1 g/dL (ref 30.0–36.0)
MCV: 84.6 fL (ref 80.0–100.0)
Platelets: 269 10*3/uL (ref 150–400)
RBC: 4.36 MIL/uL (ref 3.87–5.11)
RDW: 14.3 % (ref 11.5–15.5)
WBC: 8.4 10*3/uL (ref 4.0–10.5)
nRBC: 0 % (ref 0.0–0.2)

## 2019-08-22 LAB — BASIC METABOLIC PANEL
Anion gap: 9 (ref 5–15)
BUN: 54 mg/dL — ABNORMAL HIGH (ref 8–23)
CO2: 28 mmol/L (ref 22–32)
Calcium: 9.8 mg/dL (ref 8.9–10.3)
Chloride: 101 mmol/L (ref 98–111)
Creatinine, Ser: 2.09 mg/dL — ABNORMAL HIGH (ref 0.44–1.00)
GFR calc Af Amer: 27 mL/min — ABNORMAL LOW (ref >60)
GFR calc non Af Amer: 23 mL/min — ABNORMAL LOW (ref >60)
Glucose, Bld: 92 mg/dL (ref 70–99)
Potassium: 4.5 mmol/L (ref 3.5–5.1)
Sodium: 138 mmol/L (ref 135–145)

## 2019-08-22 MED ORDER — SODIUM CHLORIDE 0.9 % IV BOLUS
500.0000 mL | Freq: Once | INTRAVENOUS | Status: AC
  Administered 2019-08-22: 500 mL via INTRAVENOUS

## 2019-08-22 NOTE — Progress Notes (Signed)
Physical Therapy Treatment Patient Details Name: [name] MRN: [id number] DOB: July 31, 1945 Today's Date: 08/22/2019    History of Present Illness Pt is 74 y.o. female with PMH of HTN, HLD, CAD, anemia, RA, TKR (R), morbid obesity, decreased ROM in L shoulder, Presented to ED with R hand weakness. MRI reveals small acute cortical/subcortical infarct affecting the left pre and post central gyri. Pt discharged on 7/10 to SNF, but was later found to have a positive blood culture for corynebacterium and readmitted on 7/12.    PT Comments    Patient received in bed, pleasant but still with ongoing aphasia, willing to participate in PT session. See below for mobility/assist levels. Continues to need +2 assist for safety for OOB activity even in stedy due to increased fatigue and poor safety awareness/impulsivity today. Often kept stating "let me sit down" when alerady sitting on stedy seats. Deferred gait due to fatigue/impulsivity and used stedy for transfer to recliner with [place]. Left sitting up in recliner with all needs met, chair alarm active this morning.    Follow Up Recommendations  SNF;Supervision/Assistance - 24 hour     Equipment Recommendations  Rolling walker with 5" wheels    Recommendations for Other Services       Precautions / Restrictions Precautions Precautions: Fall Precaution Comments: watch SpO2, expressive aphasia Restrictions Weight Bearing Restrictions: No    Mobility  Bed Mobility Overal bed mobility: Needs Assistance       Supine to sit: Mod assist     General bed mobility comments: ModA to get to EOB with use of rail  Transfers   Equipment used: Ambulation equipment used Transfers: Sit to/from [facility] Sit to Stand: Min guard;+2 physical assistance Stand pivot transfers: Total assist       General transfer comment: min gaurd to come to stand in stedy but then very fatigued and kept stating "I need to sit down", cues for  good hand placement on stedy and safety sitting in stedy seat  Ambulation/Gait             General Gait Details: deferred, safety/fatigue   Stairs             Wheelchair Mobility    Modified Rankin (Stroke Patients Only)       Balance Overall balance assessment: Needs assistance Sitting-balance support: Feet supported;Bilateral upper extremity supported Sitting balance-Leahy Scale: Fair Sitting balance - Comments: MinA for balance when dynamically challenged   Standing balance support: Bilateral upper extremity supported Standing balance-Leahy Scale: Poor Standing balance comment: reliant on UE support and Min-ModA for balance                            Cognition Arousal/Alertness: Awake/alert Behavior During Therapy: WFL for tasks assessed/performed Overall Cognitive Status: Impaired/Different from baseline Area of Impairment: Orientation;Attention;Memory;Following commands;Safety/judgement;Awareness;Problem solving                   Current Attention Level: Sustained Memory: Decreased recall of precautions;Decreased short-term memory Following Commands: Follows one step commands inconsistently;Follows one step commands with increased time Safety/Judgement: Decreased awareness of safety;Decreased awareness of deficits Awareness: Intellectual Problem Solving: Slow processing;Decreased initiation;Difficulty sequencing;Requires verbal cues;Requires tactile cues General Comments: impulsive and seemed fatigued today, often stating "I need to sit down" when already sitting on stedy seats; trying to sit up and stand up before stedy was in a good place/equipment was ready      Exercises  General Comments General comments (skin integrity, edema, etc.): fatigued today, impulsive and poor safety awareness      Pertinent Vitals/Pain Pain Assessment: Faces Pain Score: 0-No pain Faces Pain Scale: No hurt Pain Intervention(s): Limited activity  within patient's tolerance;Monitored during session    Home Living                      Prior Function            PT Goals (current goals can now be found in the care plan section) Acute Rehab PT Goals Patient Stated Goal: none stated PT Goal Formulation: With patient Time For Goal Achievement: 08/24/19 Potential to Achieve Goals: Good Progress towards PT goals: Progressing toward goals    Frequency    Min 3X/week      PT Plan Current plan remains appropriate    Co-evaluation              AM-PAC PT "6 Clicks" Mobility   Outcome Measure  Help needed turning from your back to your side while in a flat bed without using bedrails?: A Little Help needed moving from lying on your back to sitting on the side of a flat bed without using bedrails?: A Little Help needed moving to and from a bed to a chair (including a wheelchair)?: A Lot Help needed standing up from a chair using your arms (e.g., wheelchair or bedside chair)?: A Lot Help needed to walk in hospital room?: A Lot Help needed climbing 3-5 steps with a railing? : Total 6 Click Score: 13    End of Session Equipment Utilized During Treatment: Gait belt Activity Tolerance: Patient limited by fatigue Patient left: in chair;with call bell/phone within reach;with chair alarm set Nurse Communication: Mobility status PT Visit Diagnosis: Other abnormalities of gait and mobility (R26.89);Muscle weakness (generalized) (M62.81) Hemiplegia - Right/Left: Right Hemiplegia - dominant/non-dominant: Dominant Hemiplegia - caused by: Cerebral infarction     Time: 3818-2993 PT Time Calculation (min) (ACUTE ONLY): 26 min  Charges:  $Therapeutic Activity: 23-37 mins                     Windell Norfolk, DPT, PN1   Supplemental Physical Therapist [place]    Pager [phone] Acute Rehab Office [phone]

## 2019-08-22 NOTE — Progress Notes (Signed)
Family Medicine Teaching Service Daily Progress Note Intern Pager: [phone]  Patient name: [name] Medical record number: [id number] Date of birth: November 29, 1945 Age: 74 y.o. Gender: female  Primary Care Provider: Dixie Dials, MD Consultants: Neuro and CVTS Code Status: FULL  Pt Overview and Major Events to Date:  Admitted 7/12.  New CVA 7/16  Assessment and Plan: Debra Riffe Hooksis a 74 y.o.femalepresenting after a positive result on Blood culture post discharge to a SNF.PMH is significant for CVA,hyperlipidemia, hypertension, anemia, and rheumatoid arthritis.  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 Patient is oriented to person today. She perseverates at times on the phrase "yes ma'am." Concern for delirium due to length of stay and waxing and waning alertness.Currentlymedically stable for discharge. Has bed request in for [facility], but waiting for insurance re-authorization. -Neurologyconsulted, signed off, f/u outpatient -CVTS consulted, patient not a good candidate for surgery. -Continue Eliquis - Tylenol scheduled -PT/OT: SNF -Appreciate speech recommendations and assistance with aphasia  -Continue Crestor --Palliativeconsulted, appreciate recs, will remain Full Code - Medically stable for d/c to [facility] approval - Continue Delirium protocol  Acute hypoxic respiratory failure, in setting of cor pulmonaleresolved O2 sats remain stable on RA overnight.  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 Has been present long-term, however she has not followed upoutpatient. Stable and no evidence of fluid overload at present.  BLE non pitting edema.  No oxygen requirement past 48hrs. -Oxygen therapyPRN   AKIon CKD HU:TMLYYT. Most recent Cr2.09. Baseline appears to be around 1.5. +/- .1. Concerned that the patient may not be drinking at meal times. Will continue to monitor intake. -Continue to monitor daily  - Fluid bolus 500cc - Assist with meals and order fluids   RDVT: Stable. Age-indeterminate DVT found in right peroneal veins on 7/9. -Eliquis was restarted 7/19/2021per neuro Eliquis is recommended for 3 months then ASA alone.  Previous CorynebacteriumBcxcontaminant: Resolved. Patient was discharged to SNFon 7/10with cultures pending; however they came back positive for corynebacterium,felt to be contaminant. Repeat culture has shown no growth.  KPT:WSFKCLE, stable. Home meds: lopressor 12.16m BID.Most recentBP139/69. -Will continue low-dose home metoprolol  -Monitor BP  Rheumatoid arthritis- stable, chronic Patient takes prednisone5 mg, leflunomide, and hydroxychloroquine.  - Continue home meds - Protonix  Hyperlipidemia- stable and chronic Patient is post- CVA and has a hx HLD. Patient takes rosuvastatin at home. - Continue home meds  Chronic pain syndrome - Continue homeNorco 10-325TID PRN  Hypothyroidism- Chronic and stable Patient takes levothyroxine. - Continue home medication    FEN/GI: Soft PPx: Eliquis  Disposition: SNF pending insurance approval, medically stable for discharge  Subjective:  Patient had no overnight events.   Objective: Temp:  [98.3 F (36.8 C)-99.5 F (37.5 C)] 98.3 F (36.8 C) (07/25 2042) Pulse Rate:  [77-79] 79 (07/25 2042) Resp:  [18-20] 20 (07/25 2042) BP: (129-139)/(50-69) 139/69 (07/25 2042) SpO2:  [94 %-98 %] 94 % (07/25 2042) Physical Exam: General: Resting comfortably in bed. Oriented to person ONLY. Amicable mood.  Cardiovascular: Regular rate and rhthym Respiratory: Clear and equal Bil., no extra work of breathing noted Abdomen:  No pain to palpation, normoactive bowel sounds, Ostomy bag clean Extremities: Distal pulses intact. Non-pitting edema BLE Neuro: 4/5 R arm, 5/5 L arm, 3/5 R grip 4/5 L  grip, 4/5 R leg 5/5 L leg, 4/5 R plantar flextion 5/5 R dorsiflexion unable to get L dorsi and plantar due to receptive aphasia. No CN deficits noted on exam of note patient did understand "stick your tongue out" today for CN 12 assessment. Peseverates on phrase "yes ma'am" throughout the interview. Laboratory: Recent Labs  Lab 08/17/19 0257 08/20/19 0350 08/22/19 0137  WBC 6.8 8.3 8.4  HGB 11.0* 11.2* 11.1*  HCT 37.2 37.5 36.9  PLT 222 267 269   Recent Labs  Lab 08/19/19 0702 08/20/19 0350 08/22/19 0137  NA 139 139 138  K 4.1 4.4 4.5  CL 101 102 101  CO2 _0 BUN 46* 51* 54*  CREATININE 1.60* 1.67* 2.09*  CALCIUM 9.8 9.8 9.8  GLUCOSE 98 94 92      Imaging/Diagnostic Tests:   Freida Busman, MD 08/22/2019, 6:54 AM PGY-1, [place] Intern pager: [phone], text pages welcome

## 2019-08-22 NOTE — TOC Progression Note (Signed)
Transition of Care [facility]) - Progression Note    Patient Details  Name: [name] MRN: [id number] Date of Birth: 1945-06-12  Transition of Care [facility]) CM/SW [place], [place] Phone Number: 08/22/2019, 10:34 AM  Clinical Narrative:     CSW called [employer] to check insurance [place]. Pts insurance Josem Kaufmann is will pending.   CSW will continue to follow.   Expected Discharge Plan: [place] Barriers to Discharge: Continued Medical Work up  Expected Discharge Plan and Services Expected Discharge Plan: [place] arrangements for the past 2 months: Apartment Expected Discharge Date: 08/10/19                                     Social Determinants of Health (SDOH) Interventions    Readmission Risk Interventions No flowsheet data found.  Emeterio Reeve, Latanya Presser, [place] Social Worker [phone]

## 2019-08-23 LAB — BASIC METABOLIC PANEL
Anion gap: 9 (ref 5–15)
BUN: 56 mg/dL — ABNORMAL HIGH (ref 8–23)
CO2: 27 mmol/L (ref 22–32)
Calcium: 9.4 mg/dL (ref 8.9–10.3)
Chloride: 102 mmol/L (ref 98–111)
Creatinine, Ser: 1.92 mg/dL — ABNORMAL HIGH (ref 0.44–1.00)
GFR calc Af Amer: 30 mL/min — ABNORMAL LOW (ref >60)
GFR calc non Af Amer: 26 mL/min — ABNORMAL LOW (ref >60)
Glucose, Bld: 82 mg/dL (ref 70–99)
Potassium: 4.9 mmol/L (ref 3.5–5.1)
Sodium: 138 mmol/L (ref 135–145)

## 2019-08-23 LAB — SARS CORONAVIRUS 2 (TAT 6-24 HRS): SARS Coronavirus 2: NEGATIVE

## 2019-08-23 MED ORDER — SPIRIVA HANDIHALER 18 MCG IN CAPS
18.0000 ug | ORAL_CAPSULE | Freq: Every day | RESPIRATORY_TRACT | 0 refills | Status: DC
Start: 2019-08-23 — End: 2019-09-15

## 2019-08-23 NOTE — Discharge Instructions (Signed)
° ° ° °

## 2019-08-23 NOTE — TOC Progression Note (Signed)
Transition of Care [facility]) - Progression Note    Patient Details  Name: [name] MRN: [id number] Date of Birth: 1945/07/23  Transition of Care [facility]) CM/SW [place],  Phone Number: 08/23/2019, 11:50 AM  Clinical Narrative:    CSW spoke with Tammy from [place].  SNF has received insurance auth for pt.  CSW will continue to assist for discharging planning.   Expected Discharge Plan: [place] Barriers to Discharge: Continued Medical Work up  Expected Discharge Plan and Services Expected Discharge Plan: [place] arrangements for the past 2 months: Apartment Expected Discharge Date: 08/10/19                                     Social Determinants of Health (SDOH) Interventions    Readmission Risk Interventions No flowsheet data found.

## 2019-08-23 NOTE — NC FL2 (Signed)
[place] MEDICAID FL2 LEVEL OF CARE SCREENING TOOL     IDENTIFICATION  Patient Name: [name] Birthdate: [birth year] Sex: female Admission Date (Current Location): 08/08/2019  [facility] and [place] Number:  Herbalist and Address:  [facility] [place], [place] [address] [place], [place] [address]      Provider Number: [id number]  Attending Physician Name and Address:  Lind Covert, MD  Relative Name and Phone Number:       Current Level of Care: Hospital Recommended Level of Care: [place] Prior Approval Number:    Date Approved/Denied:   PASRR Number: [id number]  Discharge Plan: SNF    Current Diagnoses: Patient Active Problem List   Diagnosis Date Noted  . Pressure injury of skin 08/19/2019  . Goals of care, counseling/discussion   . Acute respiratory failure with hypoxia ([place])   . Lung infiltrate   . Palliative care encounter   . Acute on chronic combined systolic and diastolic CHF (congestive heart failure) ([place]) 08/09/2019  . Contamination of blood culture 08/09/2019  . Hypoxia 08/08/2019  . Mitral valve disorder   . Hypertension, essential   . CVA (cerebral vascular accident) ([place]) 08/01/2019  . Pain due to onychomycosis of toenails of both feet 08/31/2018  . Oxygen dependent   . Fever   . Decreased range of motion of left shoulder   . Drug-induced neutropenia ([place])   . Other pancytopenia ([place])   . Methotrexate toxicity   . 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 Acute diverticulitis   . Ventral hernia without obstruction or gangrene   . Cellulitis of leg, left   . Morbid obesity ([place])   . Diverticulitis 06/18/2017  . 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 S/P revision of total knee 10/24/2015    Orientation RESPIRATION BLADDER Height & Weight     Self, Time, Situation, Place  O2 Incontinent Weight: 255 lb 11.7 oz (116 kg) Height:  5' 3" (160 cm)  BEHAVIORAL SYMPTOMS/MOOD NEUROLOGICAL BOWEL  NUTRITION STATUS      Colostomy Diet (See discharge summary)  AMBULATORY STATUS COMMUNICATION OF NEEDS Skin   Extensive Assist Verbally Normal                       Personal Care Assistance Level of Assistance  Bathing, Feeding, Dressing Bathing Assistance: Maximum assistance Feeding assistance: Limited assistance Dressing Assistance: Maximum assistance     Functional Limitations Info  Sight, Hearing, Speech Sight Info: Adequate Hearing Info: Adequate Speech Info: Adequate    SPECIAL CARE FACTORS FREQUENCY        PT Frequency: 5x a week OT Frequency: 5x a week     Speech Therapy Frequency: 5x a week      Contractures Contractures Info: Not present    Additional Factors Info  Code Status, Allergies Code Status Info: Full Allergies Info: NKA           Current Medications (08/23/2019):  This is the current hospital active medication list Current Facility-Administered Medications  Medication Dose Route Frequency Provider Last Rate Last Admin  . acetaminophen (TYLENOL) tablet 1,000 mg  1,000 mg Oral Q8H Beard, Samantha N, DO   1,000 mg at 08/23/19 1660   Or  . acetaminophen (TYLENOL) suppository 650 mg  650 mg Rectal Q8H Beard, Samantha N, DO      . apixaban (ELIQUIS) tablet 5 mg  5 mg Oral BID Freida Busman, MD   5 mg  at 08/23/19 0831  . cycloSPORINE (RESTASIS) 0.05 % ophthalmic emulsion 1 drop  1 drop Both Eyes TID PRN Ouida Sills, Chelsey L, DO      . feeding supplement (ENSURE ENLIVE) (ENSURE ENLIVE) liquid 237 mL  237 mL Oral BID BM Beard, Samantha N, DO   237 mL at 08/23/19 0832  . HYDROcodone-acetaminophen (NORCO) 10-325 MG per tablet 1 tablet  1 tablet Oral TID PRN Doristine Mango L, DO   1 tablet at 08/14/19 2122  . hydroxychloroquine (PLAQUENIL) tablet 200 mg  200 mg Oral BID Ouida Sills, Chelsey L, DO   200 mg at 08/23/19 7195  . leflunomide (ARAVA) tablet 10 mg  10 mg Oral Daily Anderson, Chelsey L, DO   10 mg at 08/23/19 0830  . levothyroxine (SYNTHROID)  tablet 50 mcg  50 mcg Oral Q0600 Ouida Sills, Chelsey L, DO   50 mcg at 08/23/19 9747  . metoprolol tartrate (LOPRESSOR) tablet 12.5 mg  12.5 mg Oral BID Anderson, Chelsey L, DO   12.5 mg at 08/23/19 1855  . polyethylene glycol (MIRALAX / GLYCOLAX) packet 17 g  17 g Oral Daily PRN Ouida Sills, Chelsey L, DO      . predniSONE (DELTASONE) tablet 5 mg  5 mg Oral Daily Anderson, Chelsey L, DO   5 mg at 08/23/19 0158  . rosuvastatin (CRESTOR) tablet 20 mg  20 mg Oral q1800 Anderson, Chelsey L, DO   20 mg at 08/22/19 1649     Discharge Medications: Please see discharge summary for a list of discharge medications.  Relevant Imaging Results:  Relevant Lab Results:   Additional Information SS#: [id number]  Emeterio Reeve, [place]

## 2019-08-23 NOTE — Progress Notes (Signed)
OT Cancellation Note  Patient Details Name: [name] MRN: [id number] DOB: Feb 17, 1945   Cancelled Treatment:    Reason Eval/Treat Not Completed: Other (comment) (colostomy bag busted. ) Will re-attempt session after colostomy bag change.  Layla Maw 08/23/2019, 3:19 PM

## 2019-08-23 NOTE — TOC Transition Note (Addendum)
Transition of Care [facility]) - CM/SW Discharge Note   Patient Details  Name: [name] MRN: [id number] Date of Birth: 03/06/1945  Transition of [facility]) CM/SW Contact:  Gabrielle Dare Phone Number: 08/23/2019, 4:17 PM   Clinical Narrative:    Patient will Discharge To: [facility] Anticipated DC Date:08/23/19 Family Notified:yes, daughter, Makella Buckingham, at [phone] Transport By: Corey Harold   Per MD patient ready for DC to [place] . RN, patient, patient's family, and facility notified of DC. Assessment, Fl2/[place], and Discharge Summary sent to facility. RN given number for report [phone], Room 108). DC packet on chart. Ambulance transport requested for patient.   CSW signing off.  Reed Breech LCSWA [phone]     Final next level of care: Skilled Nursing Facility Barriers to Discharge: No Barriers Identified   Patient Goals and CMS Choice Patient states their goals for this hospitalization and ongoing recovery are:: To get better CMS Medicare.gov Compare Post Acute Care list provided to:: Patient Choice offered to / list presented to : Patient  Discharge Placement              Patient chooses bed at:  ([facility]) Patient to be transferred to facility by: [place] Name of family member notified: [name], daughter [phone] Patient and family notified of of transfer: 08/23/19  Discharge Plan and Services                                     Social Determinants of Health (SDOH) Interventions     Readmission Risk Interventions No flowsheet data found.

## 2019-08-23 NOTE — Discharge Summary (Addendum)
[facility] Discharge Summary  Patient name: [name] Medical record number: [id number] Date of birth: 1945/08/31 Age: 74 y.o. 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 NAD.  Cardiovascular: Regular rate and rhythm, no murmur noted Respiratory: Clear and equal bil, no extra work of breathing Abdomen: No pain to palpation, normoactive bowel sounds, clean ostomy bag Extremities: Distal pulses intact, Bil LE non-pitting edema Neuro:Patient able to lift herself upright with both arms in bed today for the 1st time, 4/5 adduction in R arm, 5/5 Abduction in R arm. 5/5 L arm, 5/5 BLE, unable to test dorsi and plantarflexion due to receptive aphasia. Could not test CN 11 or 12 due to receptive aphasia. CN 3-10 no focal deficits noted. Continues to have global aphasia. Only able to communicated alert to person.  Brief Hospital Course:  [name] is a 74 y.o. female who presented 08/09/2019 after a positive result on blood culture post discharge as well as acute hypoxemic respiratory failure requiring 2L oxygen.  PMH is significant for CVA, hyperlipidemia, hypertension, anemia, and rheumatoid arthritis.   Acute CVA with history of recent CVA (7/5) on Eliquis for DVT tx On Day 5 of hospitalization, patient developed global aphasia with worsening  right-sided facial, hand and leg weakness. Neurology consulted immediately and recommended proceeding with STAT MRI brain which showed a marked interval increase in extent of the acute/early subacute left MCA territory cortical/subcortical infarct from 7/5, a new punctate acute/early subacute mid left frontal lobe infarct, and subcentimeter subacute infarct with right cerebellum. Home eliquis for DVT was discontinued and replaced by Heparin gtt (neuro protocol dose) as to quickly d/c if needed. She remained stable without concern for hemorrhagic conversion and thus eliquis was restarted several days later. 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 Reached out to CVTS to discuss surgical intervention but patient felt to be a poor surgical candidate.  Palliative care consulted due to poor prognosis, daughter requested continued full aggressive care. Received PT/OT/speech throughout stay with some improvement of deficits. Unfortunately denied from CIR by insurance, discharged to SNF for further rehab.  AKI on CKD 2 Patient received 40 IV Lasix x2, due to concern for CHF exacerbation at presentation. Patient developed AKI after second Lasix dose and received fluid bolus. Patient Cr went back to baseline with bolus. Cr crept up again with decrease fluid intake likely secondary impaired cognitive function. Fluid bolus given and emphasis was placed on fluid intake at meal times.   Severe RV dysfunction with cor pulmonale  Group III PAH BNP 1,197 at admission, previously 550 in 2015. Unsure of patients baseline.   7/8 echo moderately reduced RV function with associated pulmonary hypertension. 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 This admission  she received 40 IV Lasix x3 with good fluid output. Ambulatory pulse oximetry showed de-sats to 81%. Patient required 1-2L while ambulating.  Patient required 0-2L [place] at night. Post CVA patient was on RA and consistently satting >90%. She has a significant smoking history and has previously been recommended to follow-up with pulmonology for PFTs per cardiology.  Referral placed for pulmonology last admission.    Productive Cough Patient with yellow productive sputum. Questionable whether cough began last admission or has been chronic. CXR showed multifocal airspace opacities which could be due to asymmetric edema and/or infectious etiology. This was unchanged from prior CT. Due to uncertainty, patient was started on 100 mg Doxycycline x5 days (7/13-7/17) to treat CAP. Sputum changed from yellow to clear. Cough ceased by discharge.   Hx Positive Blood Cx  Patient with blood cultures positive for corynebacterium on 7/8. Repeat blood cultures performed on 7/12 showed no growth. Original culture likely a contaminant. Patient afebrile on admission, VSS. No systemic signs of infection.      Issues for Follow Up:  1. Follow up BMP to reassess Creatinine. Follow up with cardiologist for possible medication adjustments. Discontinued Lasix due to AKI and poor oral intake s/p second CVA. 2. Discharged with Spiriva. PFT's outpatient if appropriate given status. Referral to pulmonology placed. 3.  Continue PT/OT/SLP for right sided weakness and global aphasia post- CVA. Monitor for worsening neurologic status--if recurrent strokes are from fibroelastoma, there is no prevention from this for this patient.  4.  Patient needs assists with feeds and extra emphasis should be placed on fluid intake, due to fragile Cr. 5. Consider scheduling Tylenol home meds is currently PRN for chronic pain; however there is concern patient is not able to express her pain due  cognitive impairment 2/2 CVA. 6. Consider f/u with Cardiology concerning severe pulmonary HTN, if remains appropriate given status.  7. Should continue Eliquis for approximately at least 3 months for DVT, started on 08/05/2019.  Significant Procedures:  None  Significant Labs and Imaging:  Recent Labs  Lab 08/17/19 0257 08/20/19 0350 08/22/19 0137  WBC 6.8 8.3 8.4  HGB 11.0* 11.2* 11.1*  HCT 37.2 37.5 36.9  PLT 222 267 269   Recent Labs  Lab 08/17/19 0257 08/17/19 0257 08/18/19 0440 08/18/19 0440 08/19/19 0702 08/19/19 0702 08/20/19 0350 08/20/19 0350 08/22/19 0137 08/23/19 0148  NA 134*   < > 136  --  139  --  139  --  138 138  K 4.2   < > 3.9   < > 4.1   < > 4.4   < > 4.5 4.9  CL 98   < > 99  --  101  --  102  --  101 102  CO2 26   < > 25  --  28  --  28  --  28 27  GLUCOSE 89   < > 101*  --  98  --  94  --  92 82  BUN 45*   < > 44*  --  46*  --  51*  --  54* 56*  CREATININE 1.57*   < > 1.53*  --  1.60*  --  1.67*  --  2.09* 1.92*  CALCIUM 9.4   < > 9.5  --  9.8  --  9.8  --  9.8 9.4  PHOS 3.6  --   --   --  3.5  --  3.6  --   --   --  ALBUMIN 2.7*  --   --   --  2.6*  --  2.6*  --   --   --    < > = values in this interval not displayed.   Imaging:  08/08/2019- CXR IMPRESSION: As on recent CT multifocal airspace opacities which could be due to asymmetric edema and/or infectious etiology.  Small right pleural effusion. 08/08/2019- CTA chest IMPRESSION: Stable asymmetric bibasilar consolidation, most in keeping with aspiration or infection in the acute setting.  No pulmonary embolism.  Morphologic changes in keeping with pulmonary arterial hypertension and elevated right heart pressure. Superimposed extensive coronary artery calcification.  Aortic Atherosclerosis (ICD10-I70.0).  08/11/2019- CXR IMPRESSION: Patchy airspace opacity in the lung bases with small right pleural effusion. Stable cardiac prominence. No new opacity evident.  Aortic  Atherosclerosis (ICD10-I70.0). MRI Brain without Contrast 7/16 IMPRESSION: Significantly motion degraded and limited examination as described. Marked interval increase in extent of an acute/early subacute left MCA territory cortical/subcortical infarct. A moderate to large confluent infarct is now present within the left temporal and parietal lobes as well as posterior left insula. No significant mass effect at this time. Redemonstrated small cortically based subacute infarcts within the left pre and postcentral gyri. New punctate acute/early subacute infarct within the mid left frontal lobe white matter. A subcentimeter subacute infarct within the right cerebellum was better appreciated on the prior MRI of 08/01/2019. Stable generalized parenchymal atrophy. Small chronic infarcts within the bilateral cerebellar hemispheres.   Mild ethmoid sinus mucosal thickening. Right mastoid effusion.  08/13/2019- MRA Head IMPRESSION: 1. Technically limited exam due to severe motion artifact. 2. Grossly negative intracranial MRA. No large vessel occlusion. No appreciable hemodynamically significant or correctable stenosis on this motion degraded exam. 3. 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 This was most likely artifactual nature on prior study.   Results/Tests Pending at Time of Discharge: None  Discharge Medications:  Allergies as of 08/23/2019   No Known Allergies     Medication List    TAKE these medications   apixaban 5 MG Tabs tablet Commonly known as: ELIQUIS Take 1 tablet (5 mg total) by mouth every 12 (twelve) hours.   cycloSPORINE 0.05 % ophthalmic emulsion Commonly known as: RESTASIS Place 1 drop into both eyes 3 (three) times daily as needed (dry eyes).   Enbrel 50 MG/ML injection Generic drug: etanercept Inject 50 mg into the skin every Friday.    folic acid 1 MG tablet Commonly known as: FOLVITE Take 1 mg by mouth daily.   HYDROcodone-acetaminophen 10-325 MG tablet Commonly known as: NORCO Take 1 tablet by mouth 3 (three) times daily as needed for severe pain.   hydroxychloroquine 200 MG tablet Commonly known as: PLAQUENIL Take 200 mg by mouth 2 (two) times daily.   leflunomide 10 MG tablet Commonly known as: ARAVA Take 10 mg by mouth daily.   levothyroxine 50 MCG tablet Commonly known as: SYNTHROID Take 50 mcg by mouth daily.   metoprolol tartrate 25 MG tablet Commonly known as: LOPRESSOR Take 0.5 tablets (12.5 mg total) by mouth 2 (two) times daily.   polyethylene glycol 17 g packet Commonly known as: MIRALAX / GLYCOLAX Take 17 g by mouth daily as needed for mild constipation.   predniSONE 5 MG tablet Commonly known as: DELTASONE Take 5 mg by mouth daily.   rosuvastatin 20 MG tablet Commonly known as: CRESTOR Take 1 tablet (20 mg  total) by mouth daily at 6 PM.   Spiriva HandiHaler 18 MCG inhalation capsule Generic drug: tiotropium Place 1 capsule (18 mcg total) into inhaler and inhale daily.            Durable Medical Equipment  (From admission, onward)         Start     Ordered   08/10/19 0000  For home use only DME oxygen       Question Answer Comment  Length of Need 6 Months   Mode or (Route) Nasal cannula   Liters per Minute 1   Frequency Continuous (stationary and portable oxygen unit needed)   Oxygen delivery system Gas      08/10/19 1811          Discharge Instructions: Please refer to Patient Instructions section of EMR for full details.  Patient was counseled important signs and symptoms that should prompt return to medical care, changes in medications, dietary instructions, activity restrictions, and follow up appointments.   Follow-Up Appointments:  Follow-up Information    Dixie Dials, MD. Call.   Specialty: Cardiology Why: For a hospital follow up Contact  information: [place] [address] [phone]        [facility] Follow up in 4 week(s).   Specialty: Neurology Why: call and make an appt if ongoing aggressive care, otherwise defer to palliative care Contact information: [address] [place] [phone]              Freida Busman, MD 08/23/2019, 1:01 PM PGY-1, [place] Upper-Level Resident Addendum   My edits for correction/addition/clarification are added. Please see also any attending notes.    [name], DO  Family Medicine PGY-3

## 2019-08-23 NOTE — Care Management Important Message (Signed)
Important Message  Patient Details  Name: [name] MRN: [id number] Date of Birth: 12/27/1945   Medicare Important Message Given:  Yes     [name] 08/23/2019, 3:47 PM

## 2019-09-14 ENCOUNTER — Other Ambulatory Visit: Payer: Self-pay | Admitting: Family Medicine

## 2019-09-15 NOTE — Telephone Encounter (Signed)
She has different PCP for future
